# Patient Record
Sex: Male | Born: 1945 | Race: Asian | Hispanic: No | Marital: Married | Smoking: Former smoker
Health system: Southern US, Community
[De-identification: ages and names within clinical notes are randomized; demographics above are authoritative.]

## PROBLEM LIST (undated history)

## (undated) DIAGNOSIS — I10 Essential (primary) hypertension: Secondary | ICD-10-CM

## (undated) DIAGNOSIS — Z720 Tobacco use: Secondary | ICD-10-CM

## (undated) DIAGNOSIS — F1020 Alcohol dependence, uncomplicated: Secondary | ICD-10-CM

## (undated) DIAGNOSIS — I509 Heart failure, unspecified: Secondary | ICD-10-CM

## (undated) HISTORY — PX: ABDOMINAL SURGERY: SHX537

## (undated) HISTORY — PX: APPENDECTOMY: SHX54

---

## 2015-04-21 ENCOUNTER — Ambulatory Visit
Admission: RE | Admit: 2015-04-21 | Discharge: 2015-04-21 | Disposition: A | Discharge status: Home / Self Care | Payer: Medicare Other | Source: Ambulatory Visit | Attending: Nurse Practitioner | Admitting: Nurse Practitioner

## 2015-04-21 ENCOUNTER — Other Ambulatory Visit: Payer: Self-pay | Admitting: Nurse Practitioner

## 2015-04-21 DIAGNOSIS — J189 Pneumonia, unspecified organism: Secondary | ICD-10-CM | POA: Insufficient documentation

## 2015-04-22 ENCOUNTER — Other Ambulatory Visit: Payer: Self-pay | Admitting: Nurse Practitioner

## 2015-04-22 DIAGNOSIS — R059 Cough, unspecified: Secondary | ICD-10-CM

## 2015-04-22 DIAGNOSIS — R05 Cough: Secondary | ICD-10-CM

## 2015-04-22 DIAGNOSIS — J159 Unspecified bacterial pneumonia: Secondary | ICD-10-CM

## 2015-04-28 ENCOUNTER — Ambulatory Visit
Admission: RE | Admit: 2015-04-28 | Discharge: 2015-04-28 | Disposition: A | Discharge status: Home / Self Care | Payer: Medicare Other | Source: Ambulatory Visit | Attending: Nurse Practitioner | Admitting: Nurse Practitioner

## 2015-04-28 DIAGNOSIS — R05 Cough: Secondary | ICD-10-CM

## 2015-04-28 DIAGNOSIS — R059 Cough, unspecified: Secondary | ICD-10-CM

## 2015-04-28 DIAGNOSIS — J9811 Atelectasis: Secondary | ICD-10-CM | POA: Insufficient documentation

## 2015-04-28 DIAGNOSIS — J189 Pneumonia, unspecified organism: Secondary | ICD-10-CM | POA: Diagnosis not present

## 2018-07-26 ENCOUNTER — Other Ambulatory Visit: Payer: Self-pay

## 2018-07-26 ENCOUNTER — Encounter: Payer: Self-pay | Admitting: Emergency Medicine

## 2018-07-26 ENCOUNTER — Observation Stay
Admission: EM | Admit: 2018-07-26 | Discharge: 2018-07-27 | Disposition: A | Discharge status: Home / Self Care | Payer: Medicare Other | Attending: Family Medicine | Admitting: Family Medicine

## 2018-07-26 DIAGNOSIS — E876 Hypokalemia: Secondary | ICD-10-CM | POA: Diagnosis not present

## 2018-07-26 DIAGNOSIS — I959 Hypotension, unspecified: Secondary | ICD-10-CM | POA: Diagnosis present

## 2018-07-26 DIAGNOSIS — R55 Syncope and collapse: Secondary | ICD-10-CM | POA: Diagnosis not present

## 2018-07-26 DIAGNOSIS — Z8673 Personal history of transient ischemic attack (TIA), and cerebral infarction without residual deficits: Secondary | ICD-10-CM | POA: Diagnosis not present

## 2018-07-26 DIAGNOSIS — Y903 Blood alcohol level of 60-79 mg/100 ml: Secondary | ICD-10-CM | POA: Insufficient documentation

## 2018-07-26 DIAGNOSIS — R079 Chest pain, unspecified: Secondary | ICD-10-CM

## 2018-07-26 DIAGNOSIS — I358 Other nonrheumatic aortic valve disorders: Secondary | ICD-10-CM | POA: Diagnosis not present

## 2018-07-26 DIAGNOSIS — D539 Nutritional anemia, unspecified: Secondary | ICD-10-CM | POA: Diagnosis not present

## 2018-07-26 DIAGNOSIS — F17210 Nicotine dependence, cigarettes, uncomplicated: Secondary | ICD-10-CM | POA: Diagnosis not present

## 2018-07-26 DIAGNOSIS — F10229 Alcohol dependence with intoxication, unspecified: Secondary | ICD-10-CM | POA: Diagnosis not present

## 2018-07-26 DIAGNOSIS — I110 Hypertensive heart disease with heart failure: Secondary | ICD-10-CM | POA: Insufficient documentation

## 2018-07-26 DIAGNOSIS — I5030 Unspecified diastolic (congestive) heart failure: Secondary | ICD-10-CM | POA: Insufficient documentation

## 2018-07-26 DIAGNOSIS — I351 Nonrheumatic aortic (valve) insufficiency: Secondary | ICD-10-CM | POA: Diagnosis not present

## 2018-07-26 DIAGNOSIS — Z79899 Other long term (current) drug therapy: Secondary | ICD-10-CM | POA: Diagnosis not present

## 2018-07-26 HISTORY — DX: Tobacco use: Z72.0

## 2018-07-26 HISTORY — DX: Alcohol dependence, uncomplicated: F10.20

## 2018-07-26 HISTORY — DX: Essential (primary) hypertension: I10

## 2018-07-26 LAB — COMPREHENSIVE METABOLIC PANEL
ALBUMIN: 3 g/dL — AB (ref 3.5–5.0)
ALK PHOS: 42 U/L (ref 38–126)
ALT: 10 U/L (ref 0–44)
AST: 26 U/L (ref 15–41)
Anion gap: 8 (ref 5–15)
BUN: 12 mg/dL (ref 8–23)
CALCIUM: 8 mg/dL — AB (ref 8.9–10.3)
CO2: 22 mmol/L (ref 22–32)
CREATININE: 1 mg/dL (ref 0.61–1.24)
Chloride: 106 mmol/L (ref 98–111)
GFR calc Af Amer: >60 mL/min (ref >60)
GFR calc non Af Amer: >60 mL/min (ref >60)
GLUCOSE: 93 mg/dL (ref 70–99)
Potassium: 3.3 mmol/L — ABNORMAL LOW (ref 3.5–5.1)
SODIUM: 136 mmol/L (ref 135–145)
Total Bilirubin: 0.5 mg/dL (ref 0.3–1.2)
Total Protein: 5.5 g/dL — ABNORMAL LOW (ref 6.5–8.1)

## 2018-07-26 LAB — CBC
HCT: 36.1 % — ABNORMAL LOW (ref 40.0–52.0)
HEMOGLOBIN: 12.8 g/dL — AB (ref 13.0–18.0)
MCH: 36.7 pg — ABNORMAL HIGH (ref 26.0–34.0)
MCHC: 35.3 g/dL (ref 32.0–36.0)
MCV: 104 fL — ABNORMAL HIGH (ref 80.0–100.0)
Platelets: 214 10*3/uL (ref 150–440)
RBC: 3.48 MIL/uL — AB (ref 4.40–5.90)
RDW: 13.7 % (ref 11.5–14.5)
WBC: 9.1 10*3/uL (ref 3.8–10.6)

## 2018-07-26 LAB — ETHANOL: Alcohol, Ethyl (B): 78 mg/dL — ABNORMAL HIGH (ref <10)

## 2018-07-26 LAB — TROPONIN I: Troponin I: <0.03 ng/mL (ref <0.03)

## 2018-07-26 MED ORDER — POTASSIUM CHLORIDE CRYS ER 20 MEQ PO TBCR
40.0000 meq | EXTENDED_RELEASE_TABLET | Freq: Once | ORAL | Status: AC
  Administered 2018-07-27: 40 meq via ORAL
  Filled 2018-07-26: qty 2

## 2018-07-26 MED ORDER — FOLIC ACID 1 MG PO TABS
1.0000 mg | ORAL_TABLET | Freq: Once | ORAL | Status: AC
  Administered 2018-07-27: 1 mg via ORAL
  Filled 2018-07-26: qty 1

## 2018-07-26 MED ORDER — SODIUM CHLORIDE 0.9 % IV SOLN
1000.0000 mL | Freq: Once | INTRAVENOUS | Status: AC
  Administered 2018-07-26: 1000 mL via INTRAVENOUS

## 2018-07-26 MED ORDER — THIAMINE HCL 100 MG/ML IJ SOLN
100.0000 mg | Freq: Once | INTRAMUSCULAR | Status: AC
  Administered 2018-07-27: 100 mg via INTRAVENOUS
  Filled 2018-07-26: qty 2

## 2018-07-26 NOTE — ED Triage Notes (Signed)
Pt in via [facility] from family get together; family reports patient drinks regularly, having "a few beers tonight" w/ syncopal episode while sitting at table.  Pt lethargic, drifting off to sleep during conversation; pt arousable to verbal stimuli and oriented x 4.  Pt hypotensive,  Other vitals WDL.

## 2018-07-26 NOTE — ED Provider Notes (Signed)
[facility] Emergency Department Provider Note  ____________________________________________   First MD Initiated Contact with Patient 07/26/18 2258     (approximate)  I have reviewed the triage vital signs and the nursing notes.   HISTORY  Chief Complaint Loss of Consciousness   Patient speaks [place]   HPI Cody [name] is a 72 y.o. male's medical history includes hypertension and daily alcohol and tobacco use.  He presents by EMS after reportedly passing out.  His family says that he was at a party tonight, drinking some beer and eating some food when he suddenly "fell asleep".  He had poor skin color and did not respond briefly to them.  He says that he had a brief amount of chest pain earlier but it is gone now.  Currently he denies any pain as well as shortness of breath, nausea, and vomiting.  Upon arrival he was hypotensive but that is improving with some IV fluids.  He denies any recent illness.  Reportedly the onset of the episode was acute and severe but improved quickly and nothing in particular made it better or worse.  All of his family members made a point of saying that he drinks a lot of alcohol every day.  Past Medical History:  Diagnosis Date  . Alcohol dependence (HCC)   . Hypertension   . Tobacco abuse     Patient Active Problem List   Diagnosis Date Noted  . Syncope 07/27/2018    History reviewed. No pertinent surgical history.  Prior to Admission medications   Medication Sig Start Date End Date Taking? Authorizing Provider  atorvastatin (LIPITOR) 80 MG tablet Take 1 tablet by mouth every evening. 06/26/18  Yes [provider]  carvedilol (COREG) 25 MG tablet Take 25 mg by mouth 2 (two) times daily. 06/26/18  Yes [provider]  ENTRESTO 24-26 MG Take 1 tablet by mouth 2 (two) times daily. 06/26/18  Yes [provider]    Allergies Patient has no known allergies.  History reviewed. No pertinent family  history.  Social History Social History   Tobacco Use  . Smoking status: Current Every Day Smoker    Packs/day: 0.50    Types: Cigarettes  . Smokeless tobacco: Never Used  Substance Use Topics  . Alcohol use: Yes    Alcohol/week: 35.0 standard drinks    Types: 35 Cans of beer per week  . Drug use: Never    Review of Systems Constitutional: No fever/chills Eyes: No visual changes. ENT: No sore throat. Cardiovascular: Brief episode of chest pain earlier, now improved.  Apparent syncopal episode prior to arrival.  Hypotensive upon arrival in ED. Respiratory: Denies shortness of breath. Gastrointestinal: No abdominal pain.  No nausea, no vomiting.  No diarrhea.  No constipation. Genitourinary: Negative for dysuria. Musculoskeletal: Negative for neck pain.  Negative for back pain. Integumentary: Negative for rash. Neurological: Negative for headaches, focal weakness or numbness.   ____________________________________________   PHYSICAL EXAM:  VITAL SIGNS: ED Triage Vitals  Enc Vitals Group     BP 07/26/18 2245 (!) 82/50     Pulse Rate 07/26/18 2245 71     Resp 07/26/18 2245 18     Temp 07/26/18 2245 97.7 F (36.5 C)     Temp Source 07/26/18 2245 Oral     SpO2 07/26/18 2245 99 %     Weight 07/26/18 2246 47.6 kg (105 lb)     Height 07/26/18 2246 1.524 m (5')     Head Circumference --  Peak Flow --      Pain Score 07/26/18 2246 0     Pain Loc --      Pain Edu? --      Excl. in GC? --     Constitutional: Alert, no acute distress, smiling and interacting in spite of language barrier. Eyes: Conjunctivae are normal. PERRL. EOMI. Head: Atraumatic. Nose: No congestion/rhinnorhea. Mouth/Throat: Mucous membranes are moist. Neck: No stridor.  No meningeal signs.   Cardiovascular: Normal rate, regular rhythm. Good peripheral circulation. Grossly normal heart sounds. Respiratory: Normal respiratory effort.  No retractions. Lungs CTAB. Gastrointestinal: Soft and  nontender. No distention.  No pulsatile masses, no bruits. Musculoskeletal: No lower extremity tenderness nor edema. No gross deformities of extremities. Neurologic:  Normal speech and language. No gross focal neurologic deficits are appreciated.  Skin:  Skin is warm, dry and intact. No rash noted.   ____________________________________________   LABS (all labs ordered are listed, but only abnormal results are displayed)  Labs Reviewed  CBC - Abnormal; Notable for the following components:      Result Value   RBC 3.48 (*)    Hemoglobin 12.8 (*)    HCT 36.1 (*)    MCV 104.0 (*)    MCH 36.7 (*)    All other components within normal limits  COMPREHENSIVE METABOLIC PANEL - Abnormal; Notable for the following components:   Potassium 3.3 (*)    Calcium 8.0 (*)    Total Protein 5.5 (*)    Albumin 3.0 (*)    All other components within normal limits  ETHANOL - Abnormal; Notable for the following components:   Alcohol, Ethyl (B) 78 (*)    All other components within normal limits  TROPONIN I  URINE DRUG SCREEN, QUALITATIVE ([facility] ONLY)  MAGNESIUM  PHOSPHORUS  CALCIUM, IONIZED  PREALBUMIN  VITAMIN B12  FOLATE   ____________________________________________  EKG  ED ECG REPORT I, Loleta Rose, the attending physician, personally viewed and interpreted this ECG.  Date: 07/26/2018 EKG Time: 22: 39 Rate: 69 Rhythm: normal sinus rhythm QRS Axis: normal Intervals: normal ST/T Wave abnormalities: There is about a millimeter of ST segment elevation in V2 and not quite 2 mm of ST segment elevation in V3 but without reciprocal changes.  Not consistent with STEMI. Narrative Interpretation: no definitive evidence of ischemia with some nonspecific changes in V2 and V3, no old EKG against which to compare   ____________________________________________  RADIOLOGY I, Loleta Rose, personally viewed and evaluated these images (plain radiographs) as part of my medical decision making, as  well as reviewing the written report by the radiologist.  ED MD interpretation:  No acute abnormalities  Official radiology report(s): Ct Head Wo Contrast  Result Date: 07/27/2018 CLINICAL DATA:  Syncopal episode after drinking beer tonight. EXAM: CT HEAD WITHOUT CONTRAST TECHNIQUE: Contiguous axial images were obtained from the base of the skull through the vertex without intravenous contrast. COMPARISON:  None. FINDINGS: BRAIN: No intraparenchymal hemorrhage, mass effect nor midline shift. The ventricles and sulci are normal for age. Old small LEFT cerebellar infarct. Minimal supratentorial white matter hypodensities less than expected for patient's age, though non-specific are most compatible with chronic small vessel ischemic disease. No acute large vascular territory infarcts. No abnormal extra-axial fluid collections. Basal cisterns are patent. VASCULAR: Mild calcific atherosclerosis of the carotid siphons. SKULL: No skull fracture. No significant scalp soft tissue swelling. SINUSES/ORBITS: Trace paranasal sinus mucosal thickening. Mastoid air cells are well aerated.The included ocular globes and orbital contents are non-suspicious. OTHER:  None. IMPRESSION: 1. No acute intracranial process. 2. Old small LEFT cerebellar infarct. 3. Otherwise negative non-contrast CT HEAD for age. Electronically Signed   By: Awilda Metro M.D.   On: 07/27/2018 00:35    ____________________________________________   PROCEDURES  Critical Care performed: No   Procedure(s) performed:   Procedures   ____________________________________________   INITIAL IMPRESSION / ASSESSMENT AND PLAN / ED COURSE  As part of my medical decision making, I reviewed the following data within the electronic MEDICAL RECORD NUMBER History obtained from family, Nursing notes reviewed and incorporated, Labs reviewed , EKG interpreted , Old chart reviewed and Discussed with admitting physician     Differential diagnosis  includes, but is not limited to, alcohol intoxication and overall volume depletion, electrolyte abnormality, nonspecific metabolic abnormality, ACS, transient arrhythmia, aortic pathology such as dissection, acute infectious process such as urinary tract infection or pneumonia/aspiration.  The patient is in no distress at this time.  He has some potentially concerning changes on his EKG and reports a slight episode of chest pain earlier but it is unclear what exactly was feeling or what it occurred.  He is reporting no pain currently.  Vital signs are notable for hypotension upon arrival but his blood pressure is improving rapidly and is currently around 100 systolic and he is getting fluids.  No labs are back yet.  I will continue to reassess the patient clinically and is at a minimum I anticipate the need for a second troponin and.  If ED observation.   Clinical Course as of Jul 28 123  Sat Jul 26, 2018  2347 Unremarkable laboratory work-up including alcohol level of only 78.  I Delmos not feel comfortable attributing the patient's symptoms to his intoxication.  His CMP is reassuring as well with only very slightly low potassium of 3.3 but again which cannot explain his symptoms.  He is high risk syncope based on his hypertension, the acute onset and severe nature of the symptoms and the description provided during the HPI.  I will obtain a CT scan of his head to make sure there is no evidence of any acute intracranial pathology and then discussed the case with the hospitalist for admission.   [CF]  2348 Given the report of heavy daily drinking, I am putting the patient on CIWA and ordering thiamine 100 mg IV and folate 1 mg PO   [CF]  2349 Also ordering potassium 40 mEq PO   [CF]  Sun Jul 27, 2018  0058 No acute findings on head CT.  Will discuss with hospitalist.  SBP improved to about 112.  CT Head Wo Contrast [CF]    Clinical Course User Index [CF] Loleta Rose, MD     ____________________________________________  FINAL CLINICAL IMPRESSION(S) / ED DIAGNOSES  Final diagnoses:  Syncope and collapse  Chest pain, unspecified type  Hypotension, unspecified hypotension type     MEDICATIONS GIVEN DURING THIS VISIT:  Medications  lactated ringers infusion (has no administration in time range)  0.9 %  sodium chloride infusion (0 mLs Intravenous Stopped 07/26/18 2331)  folic acid (FOLVITE) tablet 1 mg (1 mg Oral Given 07/27/18 0007)  potassium chloride SA (K-DUR,KLOR-CON) CR tablet 40 mEq (40 mEq Oral Given 07/27/18 0007)  thiamine (B-1) injection 100 mg (100 mg Intravenous Given 07/27/18 0008)     ED Discharge Orders    None       Note:  This document was prepared using Dragon voice recognition software and may include unintentional dictation errors.  Loleta Rose, MD 07/27/18 [phone]

## 2018-07-27 ENCOUNTER — Observation Stay
Admit: 2018-07-27 | Discharge: 2018-07-27 | Disposition: A | Discharge status: Home / Self Care | Payer: Medicare Other | Attending: Internal Medicine | Admitting: Internal Medicine

## 2018-07-27 ENCOUNTER — Emergency Department: Payer: Medicare Other

## 2018-07-27 ENCOUNTER — Observation Stay: Payer: Medicare Other

## 2018-07-27 ENCOUNTER — Encounter: Payer: Self-pay | Admitting: Internal Medicine

## 2018-07-27 ENCOUNTER — Other Ambulatory Visit: Payer: Self-pay

## 2018-07-27 DIAGNOSIS — R55 Syncope and collapse: Secondary | ICD-10-CM | POA: Diagnosis not present

## 2018-07-27 LAB — VITAMIN B12: Vitamin B-12: 172 pg/mL — ABNORMAL LOW (ref 180–914)

## 2018-07-27 LAB — ECHOCARDIOGRAM COMPLETE
Height: 60 in
Weight: 1630.4 oz

## 2018-07-27 LAB — URINE DRUG SCREEN, QUALITATIVE (ARMC ONLY)
Amphetamines, Ur Screen: NOT DETECTED
Barbiturates, Ur Screen: NOT DETECTED
Benzodiazepine, Ur Scrn: NOT DETECTED
CANNABINOID 50 NG, UR ~~LOC~~: NOT DETECTED
Cocaine Metabolite,Ur ~~LOC~~: NOT DETECTED
MDMA (Ecstasy)Ur Screen: NOT DETECTED
METHADONE SCREEN, URINE: NOT DETECTED
Opiate, Ur Screen: NOT DETECTED
Phencyclidine (PCP) Ur S: NOT DETECTED
TRICYCLIC, UR SCREEN: NOT DETECTED

## 2018-07-27 LAB — TROPONIN I
Troponin I: <0.03 ng/mL (ref <0.03)
Troponin I: <0.03 ng/mL (ref <0.03)

## 2018-07-27 LAB — FOLATE: Folate: 35 ng/mL (ref >5.9)

## 2018-07-27 LAB — GLUCOSE, CAPILLARY
GLUCOSE-CAPILLARY: 80 mg/dL (ref 70–99)
GLUCOSE-CAPILLARY: 153 mg/dL — AB (ref 70–99)
GLUCOSE-CAPILLARY: 110 mg/dL — AB (ref 70–99)

## 2018-07-27 LAB — PHOSPHORUS: PHOSPHORUS: 3.3 mg/dL (ref 2.5–4.6)

## 2018-07-27 LAB — BRAIN NATRIURETIC PEPTIDE: B NATRIURETIC PEPTIDE 5: 11 pg/mL (ref 0.0–100.0)

## 2018-07-27 LAB — PREALBUMIN: PREALBUMIN: 26.9 mg/dL (ref 18–38)

## 2018-07-27 LAB — MAGNESIUM: MAGNESIUM: 2.1 mg/dL (ref 1.7–2.4)

## 2018-07-27 MED ORDER — POTASSIUM CHLORIDE CRYS ER 20 MEQ PO TBCR
40.0000 meq | EXTENDED_RELEASE_TABLET | Freq: Once | ORAL | Status: AC
  Administered 2018-07-27: 40 meq via ORAL
  Filled 2018-07-27: qty 2

## 2018-07-27 MED ORDER — ENOXAPARIN SODIUM 40 MG/0.4ML ~~LOC~~ SOLN
40.0000 mg | SUBCUTANEOUS | Status: DC
  Administered 2018-07-27: 40 mg via SUBCUTANEOUS
  Filled 2018-07-27: qty 0.4

## 2018-07-27 MED ORDER — FOLIC ACID 1 MG PO TABS
1.0000 mg | ORAL_TABLET | Freq: Every day | ORAL | Status: DC
  Administered 2018-07-27: 1 mg via ORAL
  Filled 2018-07-27: qty 1

## 2018-07-27 MED ORDER — SENNOSIDES-DOCUSATE SODIUM 8.6-50 MG PO TABS: 1.0000 | ORAL_TABLET | Freq: Every evening | ORAL | Status: DC | PRN

## 2018-07-27 MED ORDER — VITAMIN B-1 100 MG PO TABS: 100.0000 mg | ORAL_TABLET | Freq: Every day | ORAL | Status: DC

## 2018-07-27 MED ORDER — BISACODYL 5 MG PO TBEC: 5.0000 mg | DELAYED_RELEASE_TABLET | Freq: Every day | ORAL | Status: DC | PRN

## 2018-07-27 MED ORDER — CARVEDILOL 6.25 MG PO TABS: 3.1250 mg | ORAL_TABLET | Freq: Two times a day (BID) | ORAL | Status: DC

## 2018-07-27 MED ORDER — ADULT MULTIVITAMIN W/MINERALS CH
1.0000 | ORAL_TABLET | Freq: Every day | ORAL | Status: DC
  Administered 2018-07-27: 1 via ORAL
  Filled 2018-07-27: qty 1

## 2018-07-27 MED ORDER — LACTATED RINGERS IV SOLN: INTRAVENOUS | Status: DC

## 2018-07-27 MED ORDER — SODIUM CHLORIDE 0.9% FLUSH
3.0000 mL | Freq: Two times a day (BID) | INTRAVENOUS | Status: DC
  Administered 2018-07-27: 3 mL via INTRAVENOUS

## 2018-07-27 MED ORDER — ASPIRIN EC 325 MG PO TBEC
325.0000 mg | DELAYED_RELEASE_TABLET | Freq: Once | ORAL | Status: AC
  Administered 2018-07-27: 325 mg via ORAL
  Filled 2018-07-27: qty 1

## 2018-07-27 MED ORDER — LORAZEPAM 2 MG/ML IJ SOLN: 0.0000 mg | Freq: Four times a day (QID) | INTRAMUSCULAR | Status: DC

## 2018-07-27 MED ORDER — NITROGLYCERIN 0.4 MG SL SUBL: 0.4000 mg | SUBLINGUAL_TABLET | SUBLINGUAL | Status: DC | PRN

## 2018-07-27 MED ORDER — LORAZEPAM 1 MG PO TABS: 1.0000 mg | ORAL_TABLET | Freq: Four times a day (QID) | ORAL | Status: DC | PRN

## 2018-07-27 MED ORDER — ASPIRIN 81 MG PO CHEW
81.0000 mg | CHEWABLE_TABLET | Freq: Every day | ORAL | Status: DC
  Administered 2018-07-27: 81 mg via ORAL
  Filled 2018-07-27: qty 1

## 2018-07-27 MED ORDER — CARVEDILOL 25 MG PO TABS
25.0000 mg | ORAL_TABLET | Freq: Two times a day (BID) | ORAL | Status: DC
  Administered 2018-07-27: 25 mg via ORAL
  Filled 2018-07-27: qty 2

## 2018-07-27 MED ORDER — ATORVASTATIN CALCIUM 20 MG PO TABS: 80.0000 mg | ORAL_TABLET | Freq: Every evening | ORAL | Status: DC

## 2018-07-27 MED ORDER — MORPHINE SULFATE (PF) 2 MG/ML IV SOLN: 2.0000 mg | INTRAVENOUS | Status: DC | PRN

## 2018-07-27 MED ORDER — ONDANSETRON HCL 4 MG/2ML IJ SOLN: 4.0000 mg | Freq: Four times a day (QID) | INTRAMUSCULAR | Status: DC | PRN

## 2018-07-27 MED ORDER — LORAZEPAM 2 MG/ML IJ SOLN: 1.0000 mg | Freq: Four times a day (QID) | INTRAMUSCULAR | Status: DC | PRN

## 2018-07-27 MED ORDER — SACUBITRIL-VALSARTAN 24-26 MG PO TABS
1.0000 | ORAL_TABLET | Freq: Two times a day (BID) | ORAL | Status: DC
  Administered 2018-07-27: 1 via ORAL
  Filled 2018-07-27: qty 1

## 2018-07-27 MED ORDER — THIAMINE HCL 100 MG/ML IJ SOLN: 100.0000 mg | Freq: Every day | INTRAMUSCULAR | Status: DC

## 2018-07-27 MED ORDER — LORAZEPAM 2 MG/ML IJ SOLN: 0.0000 mg | Freq: Two times a day (BID) | INTRAMUSCULAR | Status: DC

## 2018-07-27 NOTE — Care Management Obs Status (Signed)
MEDICARE OBSERVATION STATUS NOTIFICATION   Patient Details  Name: Cody [name] MRN: [id number] Date of Birth: 06/03/1946   Medicare Observation Status Notification Given:  Yes    Kiondre Grenz A Tobi Groesbeck, RN 07/27/2018, 3:43 PM

## 2018-07-27 NOTE — Care Management Obs Status (Signed)
MEDICARE OBSERVATION STATUS NOTIFICATION   Patient Details  Name: Cody [name] MRN: [id number] Date of Birth: [birth year]   Medicare Observation Status Notification Given:     Reviewed instructions with family, patient speaks [place].   Virgel Manifold, RN 07/27/2018, 3:42 PM

## 2018-07-27 NOTE — Discharge Summary (Signed)
[facility] [place] [facility]   PATIENT NAME: Cody [name]    MR#:  [id number]  DATE OF BIRTH:  [birth year]  DATE OF ADMISSION:  07/26/2018 ADMITTING PHYSICIAN: Barbaraann Rondo, MD  DATE OF DISCHARGE: No discharge date for patient encounter.  PRIMARY CARE PHYSICIAN: Fayrene Helper, NP    ADMISSION DIAGNOSIS:  Syncope and collapse [R55] Syncope [R55] Hypotension, unspecified hypotension type [I95.9] Chest pain, unspecified type [R07.9]  DISCHARGE DIAGNOSIS:  Active Problems:   Syncope   SECONDARY DIAGNOSIS:   Past Medical History:  Diagnosis Date  . Alcohol dependence (HCC)   . Hypertension   . 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   carvedilol 25 MG tablet Commonly known as:  COREG Take 25 mg by mouth 2 (two) times daily.   ENTRESTO 24-26 MG Generic drug:  sacubitril-valsartan Take 1 tablet by mouth 2 (two) times daily.        DISCHARGE INSTRUCTIONS:    If you experience worsening of your admission symptoms, develop shortness of breath, life threatening emergency, suicidal or homicidal thoughts you must seek medical attention immediately by calling 911 or calling your MD immediately  if symptoms less severe.  You Must read complete instructions/literature along with all the possible adverse reactions/side effects for all the Medicines you take and that have been prescribed to you. Take  any new Medicines after you have completely understood and accept all the possible adverse reactions/side effects.   Please note  You were cared for by a hospitalist during your hospital stay. If you have any questions about your discharge medications or the care you received while you were in the hospital after you are discharged, you can call the unit and asked to speak with the hospitalist on call if the hospitalist that took care of you is not available. Once you are discharged, your primary care physician will handle any further medical issues. Please note that NO REFILLS for any discharge medications will be authorized once you are discharged, as it is imperative that you return to your primary care physician (or establish a relationship with a primary care physician if you Corran not have one) for your aftercare needs so that they can reassess your need for medications and monitor your lab values.    Today   CHIEF COMPLAINT:   Chief Complaint  Patient presents with  . Loss of Consciousness    HISTORY OF PRESENT ILLNESS:     VITAL SIGNS:  Blood pressure (!) 155/64, pulse 64, temperature 98.2 F (36.8 C), temperature source Oral, resp. rate 18, height 5' (1.524 m), weight 46.2 kg, SpO2 96 %.  I/O:    Intake/Output Summary (Last 24 hours) at 07/27/2018 1719 Last data filed at 07/27/2018 1402 Gross per 24 hour  Intake 1600 ml  Output -  Net 1600 ml    PHYSICAL EXAMINATION:  GENERAL:  72 y.o.-year-old patient lying  in the bed with no acute distress.  EYES: Pupils equal, round, reactive to light and accommodation. No scleral icterus. Extraocular muscles intact.  HEENT: Head atraumatic, normocephalic. Oropharynx and nasopharynx clear.  NECK:  Supple, no jugular venous distention. No thyroid enlargement, no tenderness.  LUNGS: Normal breath sounds bilaterally, no wheezing, rales,rhonchi or crepitation. No use of accessory muscles of respiration.  CARDIOVASCULAR: S1, S2 normal. No  murmurs, rubs, or gallops.  ABDOMEN: Soft, non-tender, non-distended. Bowel sounds present. No organomegaly or mass.  EXTREMITIES: No pedal edema, cyanosis, or clubbing.  NEUROLOGIC: Cranial nerves II through XII are intact. Muscle strength 5/5 in all extremities. Sensation intact. Gait not checked.  PSYCHIATRIC: The patient is alert and oriented x 3.  SKIN: No obvious rash, lesion, or ulcer.   DATA REVIEW:   CBC Recent Labs  Lab 07/26/18 2250  WBC 9.1  HGB 12.8*  HCT 36.1*  PLT 214    Chemistries  Recent Labs  Lab 07/26/18 2250 07/27/18 0652  NA 136  --   K 3.3*  --   CL 106  --   CO2 22  --   GLUCOSE 93  --   BUN 12  --   CREATININE 1.00  --   CALCIUM 8.0*  --   MG  --  2.1  AST 26  --   ALT 10  --   ALKPHOS 42  --   BILITOT 0.5  --     Cardiac Enzymes Recent Labs  Lab 07/27/18 1403  TROPONINI <0.03    Microbiology Results  No results found for this or any previous visit.  RADIOLOGY:  Ct Head Wo Contrast  Result Date: 07/27/2018 CLINICAL DATA:  Syncopal episode after drinking beer tonight. EXAM: CT HEAD WITHOUT CONTRAST TECHNIQUE: Contiguous axial images were obtained from the base of the skull through the vertex without intravenous contrast. COMPARISON:  None. FINDINGS: BRAIN: No intraparenchymal hemorrhage, mass effect nor midline shift. The ventricles and sulci are normal for age. Old small LEFT cerebellar infarct. Minimal supratentorial white matter hypodensities less than expected for patient's age, though non-specific are most compatible with chronic small vessel ischemic disease. No acute large vascular territory infarcts. No abnormal extra-axial fluid collections. Basal cisterns are patent. VASCULAR: Mild calcific atherosclerosis of the carotid siphons. SKULL: No skull fracture. No significant scalp soft tissue swelling. SINUSES/ORBITS: Trace paranasal sinus mucosal thickening. Mastoid air cells are well aerated.The included ocular globes and orbital  contents are non-suspicious. OTHER: None. IMPRESSION: 1. No acute intracranial process. 2. Old small LEFT cerebellar infarct. 3. Otherwise negative non-contrast CT HEAD for age. Electronically Signed   By: Awilda Metro M.D.   On: 07/27/2018 00:35   US Carotid Bilateral  Result Date: 07/27/2018 CLINICAL DATA:  Syncope EXAM: BILATERAL CAROTID DUPLEX ULTRASOUND TECHNIQUE: Wallace Cullens scale imaging, color Doppler and duplex ultrasound were performed of bilateral carotid and vertebral arteries in the neck. COMPARISON:  None. FINDINGS: Criteria: Quantification of carotid stenosis is based on velocity parameters that correlate the residual internal carotid diameter with NASCET-based stenosis levels, using the diameter of the distal internal carotid lumen as the denominator for stenosis measurement. The following velocity measurements were obtained: RIGHT ICA: 96/30 cm/sec CCA: 72/18 cm/sec SYSTOLIC ICA/CCA RATIO:  1.3 ECA: 64 cm/sec LEFT ICA: 154/86 cm/sec CCA: 96/21 cm/sec SYSTOLIC ICA/CCA RATIO:  1.6 ECA: 80 cm/sec RIGHT CAROTID ARTERY: Mild plaque in the proximal ICA RIGHT VERTEBRAL ARTERY:  Antegrade flow LEFT CAROTID ARTERY:  Mild plaque in the proximal ICA LEFT VERTEBRAL ARTERY:  Antegrade flow IMPRESSION: 1. No significant stenosis in the right internal carotid artery. 2. Mildly elevated systolic and diastolic velocities in the distal ICA. The distal left ICA is tortuous in the region of measured velocities however. The mild elevation may be artifactual. Mild stenosis of approximately 50% is not completely excluded on this study. No high-grade stenosis identified. A CTA could better evaluate this region if clinically warranted. 3. Antegrade flow in both vertebral arteries. Electronically Signed   By: Gerome Sam III M.D   On: 07/27/2018 11:43   Dg Chest Port 1 View  Result Date: 07/27/2018 CLINICAL DATA:  72 year old male with syncope. EXAM: PORTABLE CHEST 1 VIEW COMPARISON:  Chest CT dated 04/28/2015  FINDINGS: The heart size and mediastinal contours are within normal limits. Both lungs are clear. The visualized skeletal structures are unremarkable. IMPRESSION: No active disease. Electronically Signed   By: Elgie Collard M.D.   On: 07/27/2018 01:55    EKG:   Orders placed or performed during the hospital encounter of 07/26/18  . EKG 12-Lead  . EKG 12-Lead  . EKG 12-Lead (at 6am)      Management plans discussed with the patient, family and they are in agreement.  CODE STATUS:     Code Status Orders  (From admission, onward)         Start     Ordered   07/27/18 0224  Full code  Continuous     07/27/18 0223        Code Status History    Date Active Date Inactive Code Status Order ID Comments User Context   07/27/2018 0224 07/27/2018 0224 Full Code [id number]  Barbaraann Rondo, MD ED      TOTAL TIME TAKING CARE OF THIS PATIENT: 35 minutes.    Evelena Asa [place] Duquette M.D on 07/27/2018 at 5:19 PM  Between 7am to 6pm - Pager - [phone]  After 6pm go to [email or web address] - Social research officer, government  [facility]  Office  [phone]  CC: Primary care physician; Fayrene Helper, NP   Note: This dictation was prepared with Dragon dictation along with smaller phrase technology. Any transcriptional errors that result from this process are unintentional.

## 2018-07-27 NOTE — Progress Notes (Signed)
RN used the interpreter tablet on a stick.  It worked very well.  Patient and his wife were very happy about their discharge teaching.  They asked questions about their medications and appointments.  They will leave in a private vehicle.  The wife will drive.  RN removed his IV.  Christean Grief, RN

## 2018-07-27 NOTE — Consult Note (Signed)
Cody [name] is a 72 y.o. male  [id number]  Primary Cardiologist: Adrian Blackwater Reason for Consultation: Syncope  HPI: This is a 72 year old [place] male who is very well-known to me presented to the hospital.  Episode where he passed out after having been drinking heavily.  He denies any chest pain or shortness of breath but was feeling dizzy.   Review of Systems: No orthopnea PND or leg swelling   Past Medical History:  Diagnosis Date  . Alcohol dependence (HCC)   . Hypertension   . Tobacco abuse     Medications Prior to Admission  Medication Sig Dispense Refill  . atorvastatin (LIPITOR) 80 MG tablet Take 1 tablet by mouth every evening.  5  . carvedilol (COREG) 25 MG tablet Take 25 mg by mouth 2 (two) times daily.  3  . ENTRESTO 24-26 MG Take 1 tablet by mouth 2 (two) times daily.  5     . aspirin  81 mg Oral Daily  . atorvastatin  80 mg Oral QPM  . carvedilol  25 mg Oral BID  . enoxaparin (LOVENOX) injection  40 mg Subcutaneous Q24H  . folic acid  1 mg Oral Daily  . LORazepam  0-4 mg Intravenous Q6H   Followed by  . [START ON 07/29/2018] LORazepam  0-4 mg Intravenous Q12H  . multivitamin with minerals  1 tablet Oral Daily  . potassium chloride  40 mEq Oral Once  . sacubitril-valsartan  1 tablet Oral BID  . sodium chloride flush  3 mL Intravenous Q12H  . thiamine  100 mg Oral Daily   Or  . thiamine  100 mg Intravenous Daily    Infusions:   No Known Allergies  Social History   Socioeconomic History  . Marital status: Married    Spouse name: Not on file  . Number of children: Not on file  . Years of education: Not on file  . Highest education level: Not on file  Occupational History  . Not on file  Social Needs  . Financial resource strain: Not on file  . Food insecurity:    Worry: Not on file    Inability: Not on file  . Transportation needs:    Medical: Not on file    Non-medical: Not on file  Tobacco Use  . Smoking status: Current Every Day  Smoker    Packs/day: 0.50    Types: Cigarettes  . Smokeless tobacco: Never Used  Substance and Sexual Activity  . Alcohol use: Yes    Alcohol/week: 35.0 standard drinks    Types: 35 Cans of beer per week  . Drug use: Never  . Sexual activity: Not on file  Lifestyle  . Physical activity:    Days per week: Not on file    Minutes per session: Not on file  . Stress: Not on file  Relationships  . Social connections:    Talks on phone: Not on file    Gets together: Not on file    Attends religious service: Not on file    Active member of club or organization: Not on file    Attends meetings of clubs or organizations: Not on file    Relationship status: Not on file  . Intimate partner violence:    Fear of current or ex partner: Not on file    Emotionally abused: Not on file    Physically abused: Not on file    Forced sexual activity: Not on file  Other Topics Concern  .  Not on file  Social History Narrative  . Not on file    History reviewed. No pertinent family history.  PHYSICAL EXAM: Vitals:   07/27/18 0939 07/27/18 0943  BP: (!) 150/70 (!) 155/64  Pulse: 72 64  Resp:    Temp:    SpO2: 98% 96%     Intake/Output Summary (Last 24 hours) at 07/27/2018 1049 Last data filed at 07/27/2018 0957 Gross per 24 hour  Intake 1360 ml  Output -  Net 1360 ml    General:  Well appearing. No respiratory difficulty HEENT: normal Neck: supple. no JVD. Carotids 2+ bilat; no bruits. No lymphadenopathy or thryomegaly appreciated. Cor: PMI nondisplaced. Regular rate & rhythm. No rubs, gallops or murmurs. Lungs: clear Abdomen: soft, nontender, nondistended. No hepatosplenomegaly. No bruits or masses. Good bowel sounds. Extremities: no cyanosis, clubbing, rash, edema Neuro: alert & oriented x 3, cranial nerves grossly intact. moves all 4 extremities w/o difficulty. Affect pleasant.  ECG: Sinus bradycardia with nonspecific ST-T changes  Results for orders placed or performed during  the hospital encounter of 07/26/18 (from the past 24 hour(s))  CBC     Status: Abnormal   Collection Time: 07/26/18 10:50 PM  Result Value Ref Range   WBC 9.1 3.8 - 10.6 K/uL   RBC 3.48 (L) 4.40 - 5.90 MIL/uL   Hemoglobin 12.8 (L) 13.0 - 18.0 g/dL   HCT 32.4 (L) 40.1 - 02.7 %   MCV 104.0 (H) 80.0 - 100.0 fL   MCH 36.7 (H) 26.0 - 34.0 pg   MCHC 35.3 32.0 - 36.0 g/dL   RDW 25.3 66.4 - 40.3 %   Platelets 214 150 - 440 K/uL  Comprehensive metabolic panel     Status: Abnormal   Collection Time: 07/26/18 10:50 PM  Result Value Ref Range   Sodium 136 135 - 145 mmol/L   Potassium 3.3 (L) 3.5 - 5.1 mmol/L   Chloride 106 98 - 111 mmol/L   CO2 22 22 - 32 mmol/L   Glucose, Bld 93 70 - 99 mg/dL   BUN 12 8 - 23 mg/dL   Creatinine, Ser 4.74 0.61 - 1.24 mg/dL   Calcium 8.0 (L) 8.9 - 10.3 mg/dL   Total Protein 5.5 (L) 6.5 - 8.1 g/dL   Albumin 3.0 (L) 3.5 - 5.0 g/dL   AST 26 15 - 41 U/L   ALT 10 0 - 44 U/L   Alkaline Phosphatase 42 38 - 126 U/L   Total Bilirubin 0.5 0.3 - 1.2 mg/dL   GFR calc non Af Amer >60 >60 mL/min   GFR calc Af Amer >60 >60 mL/min   Anion gap 8 5 - 15  Troponin I     Status: None   Collection Time: 07/26/18 10:50 PM  Result Value Ref Range   Troponin I <0.03 <0.03 ng/mL  Ethanol     Status: Abnormal   Collection Time: 07/26/18 10:50 PM  Result Value Ref Range   Alcohol, Ethyl (B) 78 (H) <10 mg/dL  Urine Drug Screen, Qualitative ([facility] only)     Status: None   Collection Time: 07/26/18 10:50 PM  Result Value Ref Range   Tricyclic, Ur Screen NONE DETECTED NONE DETECTED   Amphetamines, Ur Screen NONE DETECTED NONE DETECTED   MDMA (Ecstasy)Ur Screen NONE DETECTED NONE DETECTED   Cocaine Metabolite,Ur [place] NONE DETECTED NONE DETECTED   Opiate, Ur Screen NONE DETECTED NONE DETECTED   Phencyclidine (PCP) Ur S NONE DETECTED NONE DETECTED   Cannabinoid 50 Ng, Ur  [place] NONE DETECTED NONE DETECTED   Barbiturates, Ur Screen NONE DETECTED NONE DETECTED   Benzodiazepine, Ur Scrn  NONE DETECTED NONE DETECTED   Methadone Scn, Ur NONE DETECTED NONE DETECTED  Brain natriuretic peptide     Status: None   Collection Time: 07/26/18 10:50 PM  Result Value Ref Range   B Natriuretic Peptide 11.0 0.0 - 100.0 pg/mL  Magnesium     Status: None   Collection Time: 07/27/18  6:52 AM  Result Value Ref Range   Magnesium 2.1 1.7 - 2.4 mg/dL  Phosphorus     Status: None   Collection Time: 07/27/18  6:52 AM  Result Value Ref Range   Phosphorus 3.3 2.5 - 4.6 mg/dL  Troponin I-serum (0, 3, 6 hours)     Status: None   Collection Time: 07/27/18  6:52 AM  Result Value Ref Range   Troponin I <0.03 <0.03 ng/mL  Glucose, capillary     Status: None   Collection Time: 07/27/18  8:03 AM  Result Value Ref Range   Glucose-Capillary 80 70 - 99 mg/dL   Ct Head Wo Contrast  Result Date: 07/27/2018 CLINICAL DATA:  Syncopal episode after drinking beer tonight. EXAM: CT HEAD WITHOUT CONTRAST TECHNIQUE: Contiguous axial images were obtained from the base of the skull through the vertex without intravenous contrast. COMPARISON:  None. FINDINGS: BRAIN: No intraparenchymal hemorrhage, mass effect nor midline shift. The ventricles and sulci are normal for age. Old small LEFT cerebellar infarct. Minimal supratentorial white matter hypodensities less than expected for patient's age, though non-specific are most compatible with chronic small vessel ischemic disease. No acute large vascular territory infarcts. No abnormal extra-axial fluid collections. Basal cisterns are patent. VASCULAR: Mild calcific atherosclerosis of the carotid siphons. SKULL: No skull fracture. No significant scalp soft tissue swelling. SINUSES/ORBITS: Trace paranasal sinus mucosal thickening. Mastoid air cells are well aerated.The included ocular globes and orbital contents are non-suspicious. OTHER: None. IMPRESSION: 1. No acute intracranial process. 2. Old small LEFT cerebellar infarct. 3. Otherwise negative non-contrast CT HEAD for age.  Electronically Signed   By: Awilda Metro M.D.   On: 07/27/2018 00:35   Dg Chest Port 1 View  Result Date: 07/27/2018 CLINICAL DATA:  72 year old male with syncope. EXAM: PORTABLE CHEST 1 VIEW COMPARISON:  Chest CT dated 04/28/2015 FINDINGS: The heart size and mediastinal contours are within normal limits. Both lungs are clear. The visualized skeletal structures are unremarkable. IMPRESSION: No active disease. Electronically Signed   By: Elgie Collard M.D.   On: 07/27/2018 01:55     ASSESSMENT AND PLAN: Syncope most likely due to EtOH abuse with electrolyte disturbance such as hypokalemia and dehydration.  Agree with giving fluid and hydrate the patient and can be discharged after potassium is over 4 with follow-up tomorrow at 2 PM in the office.  We will [place] outpatient work-up.  Patient had recent echo and does not need another echocardiogram.  Valentine Barney A

## 2018-07-27 NOTE — ED Notes (Signed)
Patient transported to CT 

## 2018-07-27 NOTE — ED Notes (Signed)
Hospitalist to bedside.

## 2018-07-27 NOTE — Plan of Care (Signed)

## 2018-07-27 NOTE — H&P (Signed)
[facility] - [place] [facility]   PATIENT NAME: Cody [name]    MR#:  [id number]  DATE OF BIRTH:  1946-03-04  DATE OF ADMISSION:  07/26/2018  PRIMARY CARE PHYSICIAN: Fayrene Helper, NP   REQUESTING/REFERRING PHYSICIAN: Loleta Rose, MD  CHIEF COMPLAINT:   Chief Complaint  Patient presents with  . Loss of Consciousness    HISTORY OF PRESENT ILLNESS:  Cody [name]  is a 72 y.o. male with a known history of HTN, EtOH use/dependence p/w syncope, hypotension, CP. Pt is [place]-speaking. Hx is obtained from pt's family at bedside, albeit somewhat limited. I am told pt drinks about five beers per day. He eats five to six meals per day, but a very small quantity of food. He appears rather thin, but he is alert, comfortable and in no distress. He was apparently at a get-together with family and friends on Saturday (09/28) evening. He ate a small dinner @~2015PM. @~2100, pt's family noticed that he was very "tired"/"sleepy". He was sitting in a chair at the time. They noted him to be pale and diaphoretic. He reported generalized weakness, and was unable to stand up and/or ambulate. At some point, he turned grey and became briefly unresponsive. EMS was called. He had N/V x1, after which he reportedly felt better. There was no report of shaking, tongue-bite, incontinence or confusion. There was some report of transient chest pain, though he does not have any at the time of my assessment. BP 82/50 on arrival to ED, improved w/ IVF, 110s/70s at the time of my assessment.  Pt apparently sees Cardiology as outpt, Dr. Welton Flakes, but pt and family are unable to tell me why. Pt is on Coreg, Entresto and Lipitor at home, suggesting a Hx of CHF/cardiomyopathy. I Zaheer not appreciate a murmur on exam.  PAST MEDICAL HISTORY:   Past Medical History:  Diagnosis Date  . Alcohol dependence (HCC)   . Hypertension   . Tobacco abuse     PAST SURGICAL HISTORY:  History reviewed. No pertinent  surgical history.  SOCIAL HISTORY:   Social History   Tobacco Use  . Smoking status: Current Every Day Smoker    Packs/day: 0.50    Types: Cigarettes  . Smokeless tobacco: Never Used  Substance Use Topics  . Alcohol use: Yes    Alcohol/week: 35.0 standard drinks    Types: 35 Cans of beer per week    FAMILY HISTORY:  History reviewed. No pertinent family history.  DRUG ALLERGIES:  No Known Allergies  REVIEW OF SYSTEMS:   Review of Systems  Constitutional: Positive for diaphoresis and malaise/fatigue. Negative for chills, fever and weight loss.  HENT: Negative for congestion, ear pain, hearing loss, nosebleeds, sinus pain, sore throat and tinnitus.   Eyes: Negative for blurred vision, double vision and photophobia.  Respiratory: Negative for cough, hemoptysis, sputum production, shortness of breath and wheezing.   Cardiovascular: Positive for chest pain. Negative for palpitations, orthopnea, claudication, leg swelling and PND.  Gastrointestinal: Positive for nausea and vomiting. Negative for abdominal pain, blood in stool, constipation, diarrhea, heartburn and melena.  Genitourinary: Negative for dysuria, frequency, hematuria and urgency.  Musculoskeletal: Negative for back pain, joint pain, myalgias and neck pain.  Skin: Negative for itching and rash.  Neurological: Positive for dizziness, loss of consciousness and weakness. Negative for tingling, tremors, sensory change, speech change, focal weakness, seizures and headaches.  Psychiatric/Behavioral: Negative for memory loss. The patient does not have insomnia.    MEDICATIONS AT HOME:  Prior to Admission medications   Medication Sig Start Date End Date Taking? Authorizing Provider  atorvastatin (LIPITOR) 80 MG tablet Take 1 tablet by mouth every evening. 06/26/18  Yes [provider]  carvedilol (COREG) 25 MG tablet Take 25 mg by mouth 2 (two) times daily. 06/26/18  Yes [provider]  ENTRESTO 24-26 MG  Take 1 tablet by mouth 2 (two) times daily. 06/26/18  Yes [provider]      VITAL SIGNS:  Blood pressure 119/71, pulse 62, temperature 97.7 F (36.5 C), temperature source Oral, resp. rate (!) 21, height 5' (1.524 m), weight 47.6 kg, SpO2 100 %.  PHYSICAL EXAMINATION:  Physical Exam  Constitutional: He appears well-developed. He is active and cooperative.  Non-toxic appearance. He does not have a sickly appearance. He does not appear ill. No distress. He is not intubated.  HENT:  Head: Normocephalic and atraumatic.  Mouth/Throat: Oropharynx is clear and moist. No oropharyngeal exudate.  Eyes: Conjunctivae, EOM and lids are normal. No scleral icterus.  Neck: Neck supple. No JVD present. No thyromegaly present.  Cardiovascular: Normal rate, regular rhythm, S1 normal, S2 normal and normal heart sounds.  No extrasystoles are present. Exam reveals no gallop, no S3, no S4, no distant heart sounds and no friction rub.  No murmur heard. Pulmonary/Chest: Effort normal and breath sounds normal. No accessory muscle usage or stridor. No apnea, no tachypnea and no bradypnea. He is not intubated. No respiratory distress. He has no decreased breath sounds. He has no wheezes. He has no rhonchi. He has no rales.  Abdominal: Soft. Bowel sounds are normal. He exhibits no distension. There is no tenderness. There is no rigidity, no rebound and no guarding.  Musculoskeletal: Normal range of motion. He exhibits no edema or tenderness.  Lymphadenopathy:    He has no cervical adenopathy.  Neurological: He is alert. He is not disoriented.  Skin: Skin is warm, dry and intact. He is not diaphoretic. No erythema. No pallor.  Psychiatric: He has a normal mood and affect. His behavior is normal. Judgment normal. His mood appears not anxious. His affect is not angry and not inappropriate. His speech is not rapid and/or pressured, not delayed and not slurred. He is not actively hallucinating. He does not express  impulsivity or inappropriate judgment. He does not exhibit a depressed mood. He is communicative. He is attentive.     LABORATORY PANEL:   CBC Recent Labs  Lab 07/26/18 2250  WBC 9.1  HGB 12.8*  HCT 36.1*  PLT 214   ------------------------------------------------------------------------------------------------------------------  Chemistries  Recent Labs  Lab 07/26/18 2250  NA 136  K 3.3*  CL 106  CO2 22  GLUCOSE 93  BUN 12  CREATININE 1.00  CALCIUM 8.0*  AST 26  ALT 10  ALKPHOS 42  BILITOT 0.5   ------------------------------------------------------------------------------------------------------------------  Cardiac Enzymes Recent Labs  Lab 07/26/18 2250  TROPONINI <0.03   ------------------------------------------------------------------------------------------------------------------  RADIOLOGY:  Ct Head Wo Contrast  Result Date: 07/27/2018 CLINICAL DATA:  Syncopal episode after drinking beer tonight. EXAM: CT HEAD WITHOUT CONTRAST TECHNIQUE: Contiguous axial images were obtained from the base of the skull through the vertex without intravenous contrast. COMPARISON:  None. FINDINGS: BRAIN: No intraparenchymal hemorrhage, mass effect nor midline shift. The ventricles and sulci are normal for age. Old small LEFT cerebellar infarct. Minimal supratentorial white matter hypodensities less than expected for patient's age, though non-specific are most compatible with chronic small vessel ischemic disease. No acute large vascular territory infarcts. No abnormal extra-axial  fluid collections. Basal cisterns are patent. VASCULAR: Mild calcific atherosclerosis of the carotid siphons. SKULL: No skull fracture. No significant scalp soft tissue swelling. SINUSES/ORBITS: Trace paranasal sinus mucosal thickening. Mastoid air cells are well aerated.The included ocular globes and orbital contents are non-suspicious. OTHER: None. IMPRESSION: 1. No acute intracranial process. 2. Old  small LEFT cerebellar infarct. 3. Otherwise negative non-contrast CT HEAD for age. Electronically Signed   By: Awilda Metro M.D.   On: 07/27/2018 00:35   Dg Chest Port 1 View  Result Date: 07/27/2018 CLINICAL DATA:  72 year old male with syncope. EXAM: PORTABLE CHEST 1 VIEW COMPARISON:  Chest CT dated 04/28/2015 FINDINGS: The heart size and mediastinal contours are within normal limits. Both lungs are clear. The visualized skeletal structures are unremarkable. IMPRESSION: No active disease. Electronically Signed   By: Elgie Collard M.D.   On: 07/27/2018 01:55   IMPRESSION AND PLAN:   A/P: 69M w/ Hx EtOH use/dependence p/w syncope, hypotension, CP. Hypokalemia, hypocalcemia, hypoalbuminemia/hypoproteinemia, macrocytic anemia. Unknown cardiac Hx, follows w/ Dr. Welton Flakes. -Syncope, hypotension, CP: CP-free at the time of my assessment. Appears well. Hypotension resolved w/ IVF. Trop-I (-), rpt pending. EKG (-) STEMI. CT head (-) acute intracranial pathology, though (+) for old small cerebellar infarct. ONG29. Morphine, NTG, O2 PRN. Orthostatic VS, FSG qHS/AC, neuro checks q4h x24hr, fall precautions. Cardiac Hx unclear, on Entresto, suggesting Hx CHF/CM, will avoid administering further IVF. Echo pending. CXR (-), BNP pending. Does not clinically appear to be in failure. C/w statin, beta blocker, Entresto. Cardiology consult, records. -EtOH use/dependence: EtOH level 78 on admit. Reportedly drinks 5 beers/day. CIWA. Thiamine, folate, MVI. No Tylenol. Phos level pending. -Hypokalemia: K+ 3.3. Replete and monitor. Mag level pending. -Hypocalcemia: Ionized calcium. -Hypoalbuminemia/hypoproteinemia: Thin. Eats many small meals per day. Daily EtOH. BMI 19.90, weight 46.2kg. Suspect malnourishment. Prealbumin. -Macrocytic anemia: Hgb 12.8, no prior labwork available for comparison. MCV 104.0. Folate, B12 levels pending. -c/w home medications. -FEN/GI: Cardiac diet. -DVT PPx: Lovenox. -Code status: Full  code. -Disposition: Observation, < 2 midnights.   All the records are reviewed and case discussed with ED provider. Management plans discussed with the patient, family and they are in agreement.  CODE STATUS: Full code.  TOTAL TIME TAKING CARE OF THIS PATIENT: 75 minutes.    Barbaraann Rondo M.D on 07/27/2018 at 2:10 AM  Between 7am to 6pm - Pager - [phone]  After 6pm go to [email or web address] - Scientist, research (life sciences) [facility]  Office  [phone]  CC: Primary care physician; Fayrene Helper, NP   Note: This dictation was prepared with Dragon dictation along with smaller phrase technology. Any transcriptional errors that result from this process are unintentional.

## 2018-07-29 LAB — HIV ANTIBODY (ROUTINE TESTING W REFLEX): HIV Screen 4th Generation wRfx: NONREACTIVE

## 2018-07-29 LAB — CALCIUM, IONIZED: Calcium, Ionized, Serum: 4.8 mg/dL (ref 4.5–5.6)

## 2018-08-08 ENCOUNTER — Other Ambulatory Visit: Payer: Self-pay | Admitting: Cardiovascular Disease

## 2018-08-13 ENCOUNTER — Other Ambulatory Visit: Payer: Self-pay | Admitting: Cardiovascular Disease

## 2018-08-13 DIAGNOSIS — I200 Unstable angina: Secondary | ICD-10-CM | POA: Insufficient documentation

## 2018-08-25 ENCOUNTER — Encounter
Admission: RE | Disposition: A | Discharge status: Home / Self Care | Payer: Self-pay | Source: Ambulatory Visit | Attending: Cardiovascular Disease

## 2018-08-25 ENCOUNTER — Encounter: Payer: Self-pay | Admitting: *Deleted

## 2018-08-25 ENCOUNTER — Other Ambulatory Visit: Payer: Self-pay

## 2018-08-25 ENCOUNTER — Ambulatory Visit
Admission: RE | Admit: 2018-08-25 | Discharge: 2018-08-25 | Disposition: A | Discharge status: Home / Self Care | Payer: Medicare Other | Source: Ambulatory Visit | Attending: Cardiovascular Disease | Admitting: Cardiovascular Disease

## 2018-08-25 DIAGNOSIS — Z79899 Other long term (current) drug therapy: Secondary | ICD-10-CM | POA: Insufficient documentation

## 2018-08-25 DIAGNOSIS — F17210 Nicotine dependence, cigarettes, uncomplicated: Secondary | ICD-10-CM | POA: Insufficient documentation

## 2018-08-25 DIAGNOSIS — I2584 Coronary atherosclerosis due to calcified coronary lesion: Secondary | ICD-10-CM | POA: Insufficient documentation

## 2018-08-25 DIAGNOSIS — I200 Unstable angina: Secondary | ICD-10-CM | POA: Diagnosis present

## 2018-08-25 DIAGNOSIS — Z7982 Long term (current) use of aspirin: Secondary | ICD-10-CM | POA: Diagnosis not present

## 2018-08-25 DIAGNOSIS — R0602 Shortness of breath: Secondary | ICD-10-CM | POA: Insufficient documentation

## 2018-08-25 DIAGNOSIS — I429 Cardiomyopathy, unspecified: Secondary | ICD-10-CM | POA: Diagnosis not present

## 2018-08-25 DIAGNOSIS — J449 Chronic obstructive pulmonary disease, unspecified: Secondary | ICD-10-CM | POA: Insufficient documentation

## 2018-08-25 DIAGNOSIS — M109 Gout, unspecified: Secondary | ICD-10-CM | POA: Diagnosis not present

## 2018-08-25 DIAGNOSIS — E785 Hyperlipidemia, unspecified: Secondary | ICD-10-CM | POA: Diagnosis not present

## 2018-08-25 DIAGNOSIS — Z8249 Family history of ischemic heart disease and other diseases of the circulatory system: Secondary | ICD-10-CM | POA: Insufficient documentation

## 2018-08-25 DIAGNOSIS — I25110 Atherosclerotic heart disease of native coronary artery with unstable angina pectoris: Secondary | ICD-10-CM | POA: Diagnosis not present

## 2018-08-25 DIAGNOSIS — Z91013 Allergy to seafood: Secondary | ICD-10-CM | POA: Diagnosis not present

## 2018-08-25 DIAGNOSIS — Z9889 Other specified postprocedural states: Secondary | ICD-10-CM | POA: Insufficient documentation

## 2018-08-25 HISTORY — DX: Heart failure, unspecified: I50.9

## 2018-08-25 HISTORY — PX: LEFT HEART CATH AND CORONARY ANGIOGRAPHY: CATH118249

## 2018-08-25 SURGERY — LEFT HEART CATH AND CORONARY ANGIOGRAPHY
Anesthesia: Moderate Sedation

## 2018-08-25 MED ORDER — MIDAZOLAM HCL 2 MG/2ML IJ SOLN
INTRAMUSCULAR | Status: DC | PRN
  Administered 2018-08-25: 0.5 mg via INTRAVENOUS

## 2018-08-25 MED ORDER — ONDANSETRON HCL 4 MG/2ML IJ SOLN: 4.0000 mg | Freq: Four times a day (QID) | INTRAMUSCULAR | Status: DC | PRN

## 2018-08-25 MED ORDER — FENTANYL CITRATE (PF) 100 MCG/2ML IJ SOLN
INTRAMUSCULAR | Status: AC
  Filled 2018-08-25: qty 2

## 2018-08-25 MED ORDER — SODIUM CHLORIDE 0.9% FLUSH: 3.0000 mL | INTRAVENOUS | Status: DC | PRN

## 2018-08-25 MED ORDER — IOPAMIDOL (ISOVUE-300) INJECTION 61%
INTRAVENOUS | Status: DC | PRN
  Administered 2018-08-25: 90 mL via INTRA_ARTERIAL

## 2018-08-25 MED ORDER — ASPIRIN 81 MG PO CHEW
81.0000 mg | CHEWABLE_TABLET | ORAL | Status: AC
  Administered 2018-08-25: 81 mg via ORAL

## 2018-08-25 MED ORDER — SODIUM CHLORIDE 0.9 % IV SOLN: 250.0000 mL | INTRAVENOUS | Status: DC | PRN

## 2018-08-25 MED ORDER — METHYLPREDNISOLONE SODIUM SUCC 125 MG IJ SOLR
INTRAMUSCULAR | Status: AC
  Administered 2018-08-25: 125 mg via INTRAVENOUS
  Filled 2018-08-25: qty 2

## 2018-08-25 MED ORDER — SODIUM CHLORIDE 0.9% FLUSH: 3.0000 mL | Freq: Two times a day (BID) | INTRAVENOUS | Status: DC

## 2018-08-25 MED ORDER — SODIUM CHLORIDE 0.9 % WEIGHT BASED INFUSION: 1.0000 mL/kg/h | INTRAVENOUS | Status: DC

## 2018-08-25 MED ORDER — ASPIRIN 81 MG PO CHEW
CHEWABLE_TABLET | ORAL | Status: AC
  Filled 2018-08-25: qty 1

## 2018-08-25 MED ORDER — DIPHENHYDRAMINE HCL 50 MG/ML IJ SOLN
50.0000 mg | Freq: Once | INTRAMUSCULAR | Status: AC
  Administered 2018-08-25: 50 mg via INTRAVENOUS

## 2018-08-25 MED ORDER — ACETAMINOPHEN 325 MG PO TABS: 650.0000 mg | ORAL_TABLET | ORAL | Status: DC | PRN

## 2018-08-25 MED ORDER — HEPARIN (PORCINE) IN NACL 1000-0.9 UT/500ML-% IV SOLN
INTRAVENOUS | Status: AC
  Filled 2018-08-25: qty 1000

## 2018-08-25 MED ORDER — SODIUM CHLORIDE 0.9 % WEIGHT BASED INFUSION
3.0000 mL/kg/h | INTRAVENOUS | Status: AC
  Administered 2018-08-25: 3 mL/kg/h via INTRAVENOUS

## 2018-08-25 MED ORDER — DIPHENHYDRAMINE HCL 50 MG/ML IJ SOLN
INTRAMUSCULAR | Status: AC
  Administered 2018-08-25: 50 mg via INTRAVENOUS
  Filled 2018-08-25: qty 1

## 2018-08-25 MED ORDER — MIDAZOLAM HCL 2 MG/2ML IJ SOLN
INTRAMUSCULAR | Status: AC
  Filled 2018-08-25: qty 2

## 2018-08-25 MED ORDER — FENTANYL CITRATE (PF) 100 MCG/2ML IJ SOLN
INTRAMUSCULAR | Status: DC | PRN
  Administered 2018-08-25: 25 ug via INTRAVENOUS

## 2018-08-25 MED ORDER — METHYLPREDNISOLONE SODIUM SUCC 125 MG IJ SOLR
125.0000 mg | Freq: Once | INTRAMUSCULAR | Status: AC
  Administered 2018-08-25: 125 mg via INTRAVENOUS

## 2018-08-25 SURGICAL SUPPLY — 8 items
CATH INFINITI 5FR ANG PIGTAIL (CATHETERS) ×3 IMPLANT
CATH INFINITI 5FR JL4 (CATHETERS) ×3 IMPLANT
CATH INFINITI JR4 5F (CATHETERS) ×3 IMPLANT
KIT MANI 3VAL PERCEP (MISCELLANEOUS) ×3 IMPLANT
NEEDLE PERC 18GX7CM (NEEDLE) ×3 IMPLANT
PACK CARDIAC CATH (CUSTOM PROCEDURE TRAY) ×3 IMPLANT
SHEATH AVANTI 5FR X 11CM (SHEATH) ×3 IMPLANT
WIRE GUIDERIGHT .035X150 (WIRE) ×3 IMPLANT

## 2018-08-25 NOTE — OR Nursing (Deleted)
Attempted to call report to [employer]. No answer multiple tries. Daughter to take pt home. She will give discharge instructions to nurse and our phone number in event she ahs any questions. Pt discharged in daughters care via personal wheelchair.

## 2018-08-25 NOTE — Discharge Instructions (Signed)
Chu?p ma?ch, Ch?m sc sau khi lm thu? thu?t  Angiogram, Care After  T? thng tin ny cung c?p cho qu v? thng tin v? cch t? ch?m sc b?n thn sau khi lm th? thu?t. Chuyn gia ch?m sc s?c kh?e c?ng c th? c h??ng d?n c? th? h?n cho qu v?. Hy lin l?c v?i chuyn gia ch?m sc s?c kh?e n?u qu v? c v?n ?? ho?c th?c m?c.  Ti c th? d? ki?n ?i?u g s? x?y ra sau khi lm th? thu?t?  Sau khi lm th? thu?t, th??ng s? c v?t b?m tm v nh?y c?m ?au ? vng ??t ?ng thng.  Tun th? nh?ng h??ng d?n ny ? nh:  Ch?m sc ch? ??t ?ng thng   Tun th? cc ch? d?n c?a chuyn gia ch?m sc s?c kh?e v? cch ch?m sc ch? ??t ?ng thng. ??m b?o qu v?:  ? R?a tay b?ng x phng v n??c tr??c khi qu v? thay b?ng (b?ng). N?u khng c x phng v n??c, hy dng thu?c st trng tay.  ? Thay b?ng theo ch? d?n c?a chuyn gia ch?m sc s?c kh?e.  ? ?? nguyn cc m?i khu (ch? ph?u thu?t), keo dn da ho?c cc d?i b?ng dnh t?i ch?. Nh?ng th? dng ?? ?ng v?t m? trn da ny c th? c?n ?? nguyn t?i ch? trong 2 tu?n ho?c lu h?n. N?u cc mp d?i b?ng dnh b?t ??u long ra v cong ln, qu v? c th? c?t nh?ng ch? mp b? long ra ?. Khng bc h?t d?i b?ng dnh ra tr? khi chuyn gia ch?m sc s?c kh?e b?o qu v? lm nh? v?y.   Khng t?m b?n, b?i, ho?c s? d?ng b?n t?m n??c nng cho ??n khi ???c chuyn gia ch?m sc s?c kh?e c?a qu v? ch?p thu?n.   Quy? vi? c th? t?m vo?i hoa sen sau th?i ?i?m th?c hi?n th? thu?t 24-48 gi? ho?c theo chi? d?n cu?a chuyn gia ch?m sc s?c kh?e.  ? Nh? nhng r?a ch? ??t ?ng thng b?ng x phng v n??c.  ? Th?m nh? b?ng kh?n s?ch ?? lm kh vng ny.  ? Khng xoa va?o ch? ??t ?ng thng. Lm nh? v?y c th? gy ch?y mu.   Khng bi thu?c b?t ho?c kem d??ng va?o ch? ???t ?ng thng. Gi? cho ch? ??t ?ng thng s?ch s? v kh.   Ki?m tra ch? ??t ?ng thng m?i ngy xem c d?u hi?u nhi?m trng khng. Ki?m tra xem c:  ? T?y ??, s?ng n? ho?c ?au.  ? Di?ch ho?c mu.  ? ?m.  ? M? ho?c mi hi.  Ho?t ??ng   Ngh?  ng?i theo ch? d?n c?a chuyn gian ch?m sc s?c kh?e, th??ng l trong 1-2 ngy.   Khng nng b?t k? v?t g n?ng h?n 10 lb. (4,5 kg) ho?c theo ch? d?n c?a chuyn gia ch?m sc s?c kh?e.   Khng li xe trong vng 24 gi? n?u qu v? ???c cho dng m?t lo?i thu?c gip th? gin (thu?c gi?m ?au an th?n).   Khng li xe ho?c v?n hnh my mc h?ng n?ng trong khi dng thu?c gi?m ?au ???c k ??n.  H??ng d?n chung   Tr? l?i sinh ho?t bnh th??ng theo ch? d?n c?a chuyn gia ch?m sc s?c kh?e, th??ng l kho?ng m?t tu?n. H?i chuyn gia ch?m sc s?c kh?e v? cc ho?t ??ng no an ton cho qu v?.   N?u ch? ??t ?ng thng b?t ??  u ch?y mu, hy n?m xu?ng v ?n vo ch? ?. N?u khng h?t ch?y mu, yu c?u tr? gip ngay. ?y l m?t ca c?p c?u.   U?ng ?? n??c ?? gi? cho n??c ti?u trong ho?c c mu vng nh?t. ?i?u ny gip lo?i b? thu?c nhu?m c?n quang ra kh?i c? th? qu v?.   Ch? s? d?ng thu?c khng k ??n v thu?c k ??n theo ch? d?n c?a chuyn gia ch?m sc s?c kh?e.   Tun th? t?t c? cc l?n khm theo di theo ch? d?n c?a chuyn gia ch?m sc s?c kh?e. ?i?u ny c vai tr quan tr?ng.  Hy lin l?c v?i chuyn gia ch?m sc s?c kh?e n?u:   Qu v? b? s?t ho?c ?n l?nh.   Qu v? b? ??, s?ng n? ho?c ?au xung quanh ch? ??t ?ng thng.   Qu v? c d?ch ho?c mu ch?y ra ? ch? ??t ?ng thng.   Ch? ??t ?ng thng c c?m gia?c ?m nng khi ch?m vo.   Qu v? th?y m? ho?c mi hi thot ra ? ch? ??t ?ng thng.   Qu v? c v?t b?m tm xung quanh ch? ??t ?ng thng.   Qu v? nh?n th?y mu tch t? trong m xung quanh ch? ??t ?ng thng (kh?i t? mu). Kh?i t? mu c th? ?au khi ch?m vo.  Yu c?u tr? gip ngay l?p t?c n?u:   Qu v? b? ?au r?t nhi?u ? vng ??t ?ng thng.   Vng ??t ?ng thng s?ng ln r?t nhanh.   Vng ???t ?ng thng bi? ch?y mu v ch?y mu khng ng?ng khi qu v? ?n lin t?c ln ch? ?o?.   Khu v?c g?n ho?c ngay trn ch? ???t ?ng thng bi? ti nh??t, la?nh, ?au bu?t ho?c t.  Nh?ng tri?u ch?ng ny c th? l bi?u hi?n c?a m?t v?n  ?? nghim tr?ng c?n c?p c?u. Khng ch? xem tri?u ch?ng c h?t khng. Hy ?i khm ngay l?p t?c. G?i cho d?ch v? c?p c?u t?i ??a ph??ng (911 ? [place] Khng t? li xe ??n b?nh vi?n.  Tm t?t   Sau khi lm th? thu?t, th??ng s? c v?t b?m tm v nh?y c?m ?au ? vng ??t ?ng thng.   Sau khi lm th? thu?t, ?i?u quan tr?ng l ngh? ng?i v u?ng nhi?u n??c.   Khng t?m b?n, b?i ho?c s? d?ng b?n t?m n??c nng cho ??n khi chuyn gia ch?m sc s?c kh?e ni r?ng qu v? c th? lm ?i?u ?. Quy? vi? c th? t?m vo?i hoa sen sau th?i ?i?m th?c hi?n th? thu?t 24-48 gi? ho?c theo chi? d?n cu?a chuyn gia ch?m sc s?c kh?e.   N?u ch? ??t ?ng thng b?t ??u ch?y mu, hy n?m xu?ng v ?n vo ch? ?. N?u khng h?t ch?y mu, yu c?u tr? gip ngay. ?y l m?t ca c?p c?u.  Thng tin ny khng nh?m m?c ?ch thay th? cho l?i khuyn m chuyn gia ch?m sc s?c kh?e ni v?i qu v?. Hy b?o ??m qu v? ph?i th?o lu?n b?t k? v?n ?? g m qu v? c v?i chuyn gia ch?m sc s?c kh?e c?a qu v?.  Document Released: 02/06/2016 Document Revised: 01/18/2017 Document Reviewed: 01/18/2017  Elsevier Interactive Patient Education  2018 [employer]

## 2018-08-26 ENCOUNTER — Encounter: Payer: Self-pay | Admitting: Cardiovascular Disease

## 2019-12-20 ENCOUNTER — Ambulatory Visit: Payer: Medicare Other | Attending: Internal Medicine

## 2019-12-20 DIAGNOSIS — Z23 Encounter for immunization: Secondary | ICD-10-CM

## 2019-12-20 NOTE — Progress Notes (Signed)
   Covid-19 Vaccination Clinic  Name:  Cody [name]    MRN: [id number] DOB: 22-Jul-1946  12/20/2019  Cody [name] was observed post Covid-19 immunization for 15 minutes without incidence. He was provided with Vaccine Information Sheet and instruction to access the V-Safe system.   Cody [name] was instructed to call 911 with any severe reactions post vaccine: Marland Kitchen Difficulty breathing  . Swelling of your face and throat  . A fast heartbeat  . A bad rash all over your body  . Dizziness and weakness    Immunizations Administered    Name Date Dose VIS Date Route   Pfizer COVID-19 Vaccine 12/20/2019  9:25 AM 0.3 mL 10/09/2019 Intramuscular   Manufacturer: [employer], [employer]   Lot: [id number]   NDC: [address]-7

## 2020-01-13 ENCOUNTER — Ambulatory Visit: Payer: Medicare Other | Attending: Internal Medicine

## 2020-01-13 DIAGNOSIS — Z23 Encounter for immunization: Secondary | ICD-10-CM

## 2020-01-13 NOTE — Progress Notes (Signed)
   Covid-19 Vaccination Clinic  Name:  Cody [name]    MRN: [id number] DOB: 1946/05/26  01/13/2020  Cody [name] was observed post Covid-19 immunization for 15 minutes without incident. He was provided with Vaccine Information Sheet and instruction to access the V-Safe system.   Cody [name] was instructed to call 911 with any severe reactions post vaccine: Marland Kitchen Difficulty breathing  . Swelling of face and throat  . A fast heartbeat  . A bad rash all over body  . Dizziness and weakness   Immunizations Administered    Name Date Dose VIS Date Route   Pfizer COVID-19 Vaccine 01/13/2020  8:38 AM 0.3 mL 10/09/2019 Intramuscular   Manufacturer: [employer], [employer]   Lot: [id number]   NDC: [address]-4

## 2021-08-21 ENCOUNTER — Encounter: Payer: Self-pay | Admitting: *Deleted

## 2021-08-31 ENCOUNTER — Other Ambulatory Visit: Payer: Self-pay

## 2021-08-31 ENCOUNTER — Encounter: Payer: Self-pay | Admitting: Urology

## 2021-08-31 ENCOUNTER — Ambulatory Visit (INDEPENDENT_AMBULATORY_CARE_PROVIDER_SITE_OTHER): Payer: Medicare Other | Admitting: Urology

## 2021-08-31 DIAGNOSIS — R972 Elevated prostate specific antigen [PSA]: Secondary | ICD-10-CM

## 2021-08-31 DIAGNOSIS — N410 Acute prostatitis: Secondary | ICD-10-CM

## 2021-08-31 DIAGNOSIS — N138 Other obstructive and reflux uropathy: Secondary | ICD-10-CM | POA: Diagnosis not present

## 2021-08-31 DIAGNOSIS — N401 Enlarged prostate with lower urinary tract symptoms: Secondary | ICD-10-CM | POA: Diagnosis not present

## 2021-08-31 DIAGNOSIS — R310 Gross hematuria: Secondary | ICD-10-CM | POA: Diagnosis not present

## 2021-08-31 LAB — MICROSCOPIC EXAMINATION
Bacteria, UA: NONE SEEN
Epithelial Cells (non renal): NONE SEEN /hpf (ref 0–10)

## 2021-08-31 LAB — URINALYSIS, COMPLETE
Bilirubin, UA: NEGATIVE
Glucose, UA: NEGATIVE
Ketones, UA: NEGATIVE
Leukocytes,UA: NEGATIVE
Nitrite, UA: NEGATIVE
Protein,UA: NEGATIVE
Specific Gravity, UA: <1.005 — ABNORMAL LOW (ref 1.005–1.030)
Urobilinogen, Ur: 0.2 mg/dL (ref 0.2–1.0)
pH, UA: 5.5 (ref 5.0–7.5)

## 2021-08-31 LAB — BLADDER SCAN AMB NON-IMAGING

## 2021-08-31 MED ORDER — TAMSULOSIN HCL 0.4 MG PO CAPS: 0.4000 mg | ORAL_CAPSULE | Freq: Every day | ORAL | 11 refills | Status: DC

## 2021-08-31 MED ORDER — SULFAMETHOXAZOLE-TRIMETHOPRIM 800-160 MG PO TABS: 1.0000 | ORAL_TABLET | Freq: Two times a day (BID) | ORAL | 0 refills | Status: DC

## 2021-08-31 NOTE — Patient Instructions (Signed)
Prostate-Specific Antigen Test Why am Cody [name] having this test? Cody [name] prostate-specific antigen (PSA) test is a screening test for prostate cancer. It can identify early signs of prostate cancer, which may allow for more effective treatment. Your health care provider may recommend that you have a PSA test starting at age 74 or that you have one earlier or later, depending on your risk factors for prostate cancer. You may also have a PSA test: To monitor treatment of prostate cancer. To check whether prostate cancer has returned after treatment. If you have signs of other conditions that can affect PSA levels, such as: An enlarged prostate that is Cody [name] caused by cancer (benign prostatic hyperplasia, BPH). This condition is very common in older men. A prostate infection. Cody is being tested? This test measures Cody [name] amount of PSA in your blood. PSA is a protein that is made in Cody [name] prostate. Cody [name] prostate naturally produces more PSA as you age, but very high levels may be a sign of a medical condition. Cody kind of sample is taken? A blood sample is required for this test. It is usually collected by inserting a needle into a blood vessel or by sticking a finger with a small needle. Blood for this test should be drawn before having an exam of Cody [name] prostate. How Cody Cody [name] prepare for this test? Cody Cody [name] ejaculate starting 24 hours before your test, or as long as told by your health care provider. Tell a health care provider about: Any allergies you have. All medicines you are taking, including vitamins, herbs, eye drops, creams, and over-Cody [name]-counter medicines. This also includes: Medicines to assist with hair growth, such as finasteride. Any recent exposure to a medicine called diethylstilbestrol. Any blood disorders you have. Any recent procedures you have had, especially any procedures involving Cody [name] prostate or rectum. Any medical conditions you have. Any recent urinary tract infections (UTIs) you have had. How are  Cody [name] results reported? Your test results will be reported as a value that indicates how much PSA is in your blood. This will be given as nanograms of PSA per milliliter of blood (ng/mL). Your health care provider will compare your results to normal ranges that were established after testing a large group of people (reference ranges). Reference ranges may vary among labs and hospitals. PSA levels vary from person to person and generally increase with age. Because of this variation, there is no single PSA value that is considered normal for everyone. Instead, PSA reference ranges are used to describe whether your PSA levels are considered low or high (elevated). Common reference ranges are: Low: 0-2.5 ng/mL. Slightly to moderately elevated: 2.6-10.0 ng/mL. Moderately elevated: 10.0-19.9 ng/mL. Significantly elevated: 20 ng/mL or greater. Sometimes, Cody [name] test results may report that a condition is present when it is Cody [name] present (false-positive result). Cody Cody [name]? A test result that is higher than 4 ng/mL may Cody [name] that you are at an increased risk for prostate cancer. However, a PSA test by itself is Cody [name] enough to diagnose prostate cancer. High PSA levels may also be caused by Cody [name] natural aging process, prostate infection, or BPH. PSA screening cannot tell you if your PSA is high due to cancer or a different cause. A prostate biopsy is Cody [name] only way to diagnose prostate cancer. A risk of having Cody [name] PSA test is diagnosing and treating prostate cancer that would never have caused any symptoms or problems (overdiagnosis and overtreatment). Talk with your health care provider about Cody your results Cody [name]. Questions   to ask your health care provider Ask your health care provider, or Cody [name] department that is doing Cody [name] test: When will my results be ready? How will Cody [name] get my results? Cody are my treatment options? Cody other tests Cody Cody [name] need? Cody are my next steps? Summary Cody [name] prostate-specific  antigen (PSA) test is a screening test for prostate cancer. Your health care provider may recommend that you have a PSA test starting at age 74 or that you have one earlier or later, depending on your risk factors for prostate cancer. A test result that is higher than 4 ng/mL may Cody [name] that you are at an increased risk for prostate cancer. However, elevated levels can be caused by a number of conditions other than prostate cancer. Talk with your health care provider about Cody your results Cody [name]. This information is Cody [name] intended to replace advice given to you by your health care provider. Make sure you discuss any questions you have with your health care provider. Document Revised: 06/30/2020 Document Reviewed: 06/30/2020 Cody Cody [name]  2022 [place].   Prostatitis Prostatitis is swelling or inflammation of Cody [name] prostate gland, also called Cody [name] prostate. This gland is about 1.5 inches wide and 1 inch high, and it is involved in making semen. Cody [name] prostate is located below a man's bladder, in front of Cody [name] rectum. There are four types of prostatitis: Chronic prostatitis (CP), also called chronic pelvic pain syndrome (CPPS). This is Cody [name] most common type of prostatitis. It is associated with increased muscle tone in Cody [name] area between Cody [name] hip bones (pelvic area), around Cody [name] prostate. This type is also known as a pelvic floor disorder. Chronic bacterial prostatitis. This type usually results from an acute bacterial infection in Cody [name] prostate gland that keeps coming back or has Cody [name] been treated properly. Cody [name] symptoms are less severe than those caused by acute bacterial prostatitis, which lasts a shorter time. Asymptomatic inflammatory prostatitis. This type does Cody [name] have symptoms and does Cody [name] need treatment. This is diagnosed when tests are done for other disorders of Cody [name] urinary tract or reproductive tract. Acute bacterial prostatitis. This type starts quickly and results from an acute bacterial  infection in Cody [name] prostate gland. It is usually associated with a bladder infection, high fever, and chills. This is Cody [name] least common type of prostatitis. Cody are Cody [name] causes? Bacterial prostatitis is caused by an infection from bacteria. Chronic nonbacterial prostatitis may be caused by: Factors related to Cody [name] nervous system. This system includes thebrain, spinal cord, and nerves. An autoimmune response. This happens when Cody [name] body's disease-fighting system attacks healthy tissue in Cody [name] body by mistake. Psychological factors. These have to [place] with how Cody [name] mind works. Cody [name] causes of Cody [name] other types of prostatitis are usually Cody [name] known. Cody are Cody [name] signs or symptoms? Symptoms of this condition depend on Cody [name] type of prostatitis you have. Acute bacterial prostatitis Symptoms may include: Pain or burning during urination. Frequent and sudden urges to urinate. Trouble starting to urinate. Fever. Chills. Pain in your muscles or joints, lower back, or lower abdomen. Other types of prostatitis Symptoms may include: Sudden urges to urinate, or urinating often. Trouble starting to urinate. Weak urine stream. Dribbling after urination. Discharge coming from Cody [name] penis. Pain in Cody [name] testicles, Cody [name] penis, or Cody [name] tip of Cody [name] penis. Pain in Cody [name] area in front of Cody [name] rectum and below Cody [name] scrotum (perineum). Pain when ejaculating. How is this diagnosed? This condition may be diagnosed based on: A physical and medical exam. A digital rectal exam.  For this, Cody [name] health care provider may use a finger to feel Cody [name] prostate. A urine test to check for bacteria. A semen sample or blood tests. Ultrasound. Urodynamic tests to check how your body handles urine. Cystoscopy to look inside your bladder or inside Cody [name] part of your body that drains urine from Cody [name] bladder (urethra). How is this treated? Treatment for this condition depends on Cody [name] type of prostatitis. Treatment may involve: Medicines to relieve pain or  inflammation, or to help relax your muscles. Physical therapy. Heat therapy. Biofeedback. These techniques help you control certain body functions. Relaxation Cody [name]. Antibiotic medicine, if your condition is caused by bacteria. Sitz baths. These warm water baths help to relax your pelvic floor muscles, which helps to relieve pressure on Cody [name] prostate. Follow these instructions at home: Medicines Take over-Cody [name]-counter and prescription medicines only as told by your health care provider. If you were prescribed an antibiotic medicine, take it as told by your health care provider. Cody Cody [name] stop using Cody [name] antibiotic even if you start to feel better. Managing pain and swelling  Take sitz baths as directed by your health care provider. For a sitz bath, sit in warm water that is deep enough to cover your hips and buttocks. If directed, apply heat to Cody [name] affected area as often as told by your health care provider. Use Cody [name] heat source that your health care provider recommends, such as a moist heat pack or a heating pad. Place a towel between your skin and Cody [name] heat source. Leave Cody [name] heat on for 20-30 minutes. Remove Cody [name] heat if your skin turns bright red. This is especially important if you are unable to feel pain, heat, or cold. You may have a greater risk of getting burned. General instructions Cody Cody [name] as told by your health care provider, if you were prescribed physical therapy, biofeedback, or relaxation Cody [name]. Keep all follow-up visits as told by your health care provider. This is important. Where to find more information [employer] [facility] Diabetes and Digestive and Kidney Diseases: [email or web address] Contact a health care provider if: Your symptoms get worse. You have a fever. Get help right away if: You have chills. You feel light-headed or feel like you may faint. You cannot urinate. You have blood or blood clots in your urine. Summary Prostatitis is swelling or  inflammation of Cody [name] prostate gland. Treatment for this condition depends on Cody [name] type of prostatitis. Take over-Cody [name]-counter and prescription medicines only as told by your health care provider. Get help right away of you have chills, feel light-headed, feel like you may faint, cannot urinate, or have blood or blood clots in your urine. This information is Cody [name] intended to replace advice given to you by your health care provider. Make sure you discuss any questions you have with your health care provider. Document Revised: 11/20/2019 Document Reviewed: 11/20/2019 Cody Cody [name]  2022 [employer]  Cystoscopy Cystoscopy is a procedure that is used to help diagnose and sometimes treat conditions that affect Cody [name] lower urinary tract. Cody [name] lower urinary tract includes Cody [name] bladder and Cody [name] urethra. Cody [name] urethra is Cody [name] tube that drains urine from Cody [name] bladder. Cystoscopy is done using a thin, tube-shaped instrument with a light and camera at Cody [name] end (cystoscope). Cody [name] cystoscope may be hard or flexible, depending on Cody [name] goal of Cody [name] procedure. Cody [name] cystoscope is inserted through Cody [name] urethra, into Cody [name] bladder. Cystoscopy may be recommended if you have: Urinary tract infections that keep coming back. Blood in Cody [name] urine (hematuria). An inability to  control when you urinate (urinary incontinence) or an overactive bladder. Unusual cells found in a urine sample. A blockage in Cody [name] urethra, such as a urinary stone. Painful urination. An abnormality in Cody [name] bladder found during an intravenous pyelogram (IVP) or CT scan. Cody are Cody [name] risks? Generally, this is a safe procedure. However, problems may occur, including: Infection. Bleeding.  Cody happens during Cody [name] procedure?  You will be given one or more of Cody [name] following: A medicine to numb Cody [name] area (local anesthetic). Cody [name] area around Cody [name] opening of your urethra will be cleaned. Cody [name] cystoscope will be passed through your urethra into your  bladder. Germ-free (sterile) fluid will flow through Cody [name] cystoscope to fill your bladder. Cody [name] fluid will stretch your bladder so that your health care provider can clearly examine your bladder walls. Your doctor will look at Cody [name] urethra and bladder. Cody [name] cystoscope will be removed Cody [name] procedure may vary among health care providers  Cody can Cody [name] expect after Cody [name] procedure? After Cody [name] procedure, it is common to have: Some soreness or pain in your urethra. Urinary symptoms. These include: Mild pain or burning when you urinate. Pain should stop within a few minutes after you urinate. This may last for up to a few days after Cody [name] procedure. A small amount of blood in your urine for several days. Feeling like you need to urinate but producing only a small amount of urine. Follow these instructions at home: General instructions Return to your normal activities as told by your health care provider.  Drink plenty of fluids after Cody [name] procedure. Keep all follow-up visits as told by your health care provider. This is important. Contact a health care provider if you: Have pain that gets worse or does Cody [name] get better with medicine, especially pain when you urinate lasting longer than 72 hours after Cody [name] procedure. Have trouble urinating. Get help right away if you: Have blood clots in your urine. Have a fever or chills. Are unable to urinate. Summary Cystoscopy is a procedure that is used to help diagnose and sometimes treat conditions that affect Cody [name] lower urinary tract. Cystoscopy is done using a thin, tube-shaped instrument with a light and camera at Cody [name] end. After Cody [name] procedure, it is common to have some soreness or pain in your urethra. It is normal to have blood in your urine after Cody [name] procedure.  If you were prescribed an antibiotic medicine, take it as told by your health care provider.  This information is Cody [name] intended to replace advice given to you by your health care provider. Make sure you  discuss any questions you have with your health care provider. Document Revised: 10/07/2018 Document Reviewed: 10/07/2018 Cody Cody [name]  [employer].

## 2021-08-31 NOTE — Progress Notes (Signed)
08/31/21 9:39 AM   Cody [name] May 03, 1946 [id number]  CC: Elevated PSA, urinary symptoms, gross hematuria  HPI: 75 year old male with cardiac history and alcohol abuse here with his daughter today who helps provide much of the history.  He was referred from [place] for elevated PSA, urinary symptoms, and a single episode of gross hematuria.  He reports at least a few months of weak urinary stream and some incontinence, including incontinence overnight.  He has never tried any medications for this.  There are no prior urinalyses to review, but he had an elevated PSA of 29 on 07/14/2021, which increased further to 54 on 08/04/2021.  Unclear if he was given any antibiotics at that time.  He also reports a single episode of gross hematuria with straining about a month ago that has since resolved.  He denies any pelvic pain or dysuria.  He has a 30+ pack year smoking history.  Urinalysis today is completely benign with 0-5 WBCs, 0-2 RBCs, no bacteria, no yeast, nitrite negative, no leukocytes, PVR mildly elevated at 178 mL.  I [facility] that shows normal renal function and normal CBC.   PMH: Past Medical History:  Diagnosis Date   Alcohol dependence ([place])    CHF (congestive heart failure) ([place])    Hypertension    Tobacco abuse     Surgical History: Past Surgical History:  Procedure Laterality Date   APPENDECTOMY     LEFT HEART CATH AND CORONARY ANGIOGRAPHY N/A 08/25/2018   Procedure: LEFT HEART CATH AND CORONARY ANGIOGRAPHY;  Surgeon: Dionisio David, MD;  Location: [facility];  Service: Cardiovascular;  Laterality: N/A;     Social History:  reports that he has been smoking cigarettes. He has been smoking an average of .5 packs per day. He has never used smokeless tobacco. He reports current alcohol use of about 35.0 standard drinks per week. He reports that he does not use drugs.  Physical Exam:  Constitutional:  Alert and  oriented, No acute distress. Cardiovascular: No clubbing, cyanosis, or edema. Respiratory: Normal respiratory effort, no increased work of breathing. GI: Abdomen is soft, nontender, nondistended, no abdominal masses GU: Widely patent meatus, no lesions DRE: 80 g, smooth, no nodules or masses  Laboratory Data: Reviewed, see HPI  Pertinent Imaging: None to review  Assessment & Plan:   75 year old male with urinary symptoms of weak stream, incontinence, and frequency, single episode of gross hematuria with straining about a month ago, and significantly elevated PSA of 29 in September 2022, with increased to 21 and October 2022.  DRE enlarged prostate but benign, urinalysis completely benign today and PVR mildly elevated at 178 mL.  We had a long conversation today about possible etiologies including prostatitis, BPH, prostate cancer, bladder cancer, or other malignancy.  I recommended further evaluation with upper tract imaging with CT, as well as cystoscopy for his episode of gross hematuria, and prostate biopsy for evaluation of his significantly elevated PSA.  They are averse to any invasive procedures at this time, but they are amenable to a trial of antibiotics and Flomax with close follow-up in 3 weeks for repeat PSA.  If PSA remains elevated will again strongly recommend prostate biopsy, but if downtrending can fall to normal for possible prostatitis.  I also recommended starting Flomax for his incomplete bladder emptying and urinary symptoms, again with low threshold to pursue cystoscopy and CT urogram for further evaluation if patient and family amenable.  -Patient deferred  cystoscopy, prostate biopsy, and upper tract imaging -Bactrim DS twice daily x2 weeks for possible prostatitis, close follow-up in 3 weeks for repeat PSA, repeat PVR -Trial of Flomax for incomplete bladder emptying -Will again offer cystoscopy, upper tract imaging, and potentially prostate biopsy at follow-up pending  PSA trend    Legrand Rams, MD 08/31/2021  [facility] [address], [address] [place], [place] [address] [phone]

## 2021-09-25 ENCOUNTER — Other Ambulatory Visit: Payer: Self-pay

## 2021-09-25 ENCOUNTER — Other Ambulatory Visit: Payer: Medicare Other

## 2021-09-25 DIAGNOSIS — N410 Acute prostatitis: Secondary | ICD-10-CM

## 2021-09-26 LAB — PSA TOTAL (REFLEX TO FREE): Prostate Specific Ag, Serum: 26.8 ng/mL — ABNORMAL HIGH (ref 0.0–4.0)

## 2021-09-27 ENCOUNTER — Encounter: Payer: Self-pay | Admitting: Urology

## 2021-09-27 ENCOUNTER — Other Ambulatory Visit: Payer: Self-pay

## 2021-09-27 ENCOUNTER — Ambulatory Visit (INDEPENDENT_AMBULATORY_CARE_PROVIDER_SITE_OTHER): Payer: Medicare Other | Admitting: Urology

## 2021-09-27 DIAGNOSIS — R310 Gross hematuria: Secondary | ICD-10-CM | POA: Diagnosis not present

## 2021-09-27 DIAGNOSIS — N401 Enlarged prostate with lower urinary tract symptoms: Secondary | ICD-10-CM | POA: Diagnosis not present

## 2021-09-27 DIAGNOSIS — R972 Elevated prostate specific antigen [PSA]: Secondary | ICD-10-CM | POA: Diagnosis not present

## 2021-09-27 DIAGNOSIS — N138 Other obstructive and reflux uropathy: Secondary | ICD-10-CM | POA: Diagnosis not present

## 2021-09-27 LAB — BLADDER SCAN AMB NON-IMAGING

## 2021-09-27 NOTE — Progress Notes (Signed)
   09/27/2021 11:04 AM   Cody [name] 12/30/1945 [id number]  Reason for visit: Follow up elevated PSA, urinary symptoms, gross hematuria  HPI: I saw [name] and his daughter back in clinic for the above issues.  He has a cardiac history and history of alcohol abuse and was referred for an elevated PSA 54 with urinary symptoms and gross hematuria.  Primary urinary symptoms or weak stream and incontinence, primarily incontinence overnight.  Urinalysis at that visit was benign, and PVR was mildly elevated at 178 mL.  We had previously discussed options for elevated PSA and hematuria including work-up with prostate biopsy, cystoscopy, and CT urogram, but they deferred more invasive treatments, and we ultimately opted for a 2-week course of Bactrim for a possible prostatitis, and trial of Flomax.  He reports significant improvement in his urinary symptoms, and is having only minimal leakage at this time and his stream is much better.  PVR stable at 179 mL.  His PSA also decreased to 26.8 from 54 after antibiotics.  He denies any further gross hematuria after the antibiotics.  We discussed possible causes of the PSA elevation including prostatitis or prostate cancer, and we again discussed prostate biopsy.  They would like to repeat the PSA in 1 month to see if this was a false elevation from prostatitis prior to pursuing biopsy.  DRE at our last visit showed an enlarged prostate with no nodules or masses.  RTC 6 weeks with PSA prior, if remains significantly elevated pursue prostate biopsy Continue Flomax   Sondra Come, MD  [facility] [address] [place], [place] [address] [phone]

## 2021-09-27 NOTE — Patient Instructions (Signed)
Prostate-Specific Antigen Test °Why am I having this test? °The prostate-specific antigen (PSA) test is a screening test for prostate cancer. It can identify early signs of prostate cancer, which may allow for early detection and more effective treatment. Your health care provider may recommend that you have a PSA test starting at age 74 or that you have one earlier if you are at higher risk for prostate cancer. You may also have a PSA test: °To monitor treatment of prostate cancer. °To check whether prostate cancer has returned after treatment. °What is being tested? °This test measures the amount of PSA in your blood. PSA is a protein that is made in the prostate. The prostate naturally produces more PSA as you age, but very high levels may be a sign of a medical condition. °What kind of sample is taken? °A blood sample is required for this test. It is usually collected by inserting a needle into a blood vessel but can also be collected by sticking a finger with a small needle. Blood for this test should be drawn before having an exam of the prostate that involves digital rectal examination to avoid affecting the results. °How Cody [name] I prepare for this test? °Cody [name] not ejaculate starting 24 hours before your test, or as long as told by your health care provider, as this can cause an elevation in PSA. °Cody [name] not undergo any procedures that require manipulation of the prostate, such as biopsy or surgery, for 6 weeks before the test is done as this can cause an elevation in PSA. °Tell a health care provider about: °Any signs you may have of other conditions that can affect PSA levels, such as: °An enlarged prostate that is not caused by cancer (benign prostatic hyperplasia, or BPH). This condition is very common in older men. °A prostate or urinary tract infection. °Any allergies you have. °All medicines you are taking, including vitamins, herbs, eye drops, creams, and over-the-counter medicines. This also includes: °Medicines  to assist with hair growth, such as finasteride. °Any recent exposure to a medicine called diethylstilbestrol (DES). °Medicines such as male hormones (like testosterone) or other medicines that raise testosterone levels. °Any bleeding problems you have. °Any recent procedures you have had, especially any procedures involving the prostate or rectum. °Any medical conditions you have. °How are the results reported? °Your test results will be reported as a value that indicates how much PSA is in your blood. This will be given as nanograms of PSA per milliliter of blood (ng/mL). Your health care provider will compare your results to normal ranges that were established after testing a large group of people (reference ranges). Reference ranges may vary among labs and hospitals. °PSA levels vary from person to person and generally increase with age. Because of this variation, there is no single PSA value that is considered normal for everyone. Instead, PSA reference ranges are used to describe whether your PSA levels are considered low or high (elevated). Common reference ranges are: °Low: 0-2.5 ng/mL. °Slightly to moderately elevated: 2.6-10.0 ng/mL. °Moderately elevated: 10.0-19.9 ng/mL. °Significantly elevated: 20 ng/mL or greater. °What Cody [name] the results mean? °A test result that is higher than 4 ng/mL may mean that you have prostate cancer. However, a PSA test by itself is not enough to diagnose prostate cancer. High PSA levels may also be caused by the natural aging process, prostate infection (prostatitis), or BPH. °PSA screening cannot tell you if your PSA is high due to cancer or a different cause. °A prostate biopsy   is the only way to diagnose prostate cancer. °A risk of having the PSA test is diagnosing and treating prostate cancer that would never have caused any symptoms or problems (overdiagnosis and overtreatment). °Talk with your health care provider about what your results mean. In some cases, your health care  provider may Cody [name] more testing to confirm the results. °Questions to ask your health care provider °Ask your health care provider, or the department that is doing the test: °When will my results be ready? °How will I get my results? °What are my treatment options? °What other tests Cody [name] I need? °What are my next steps? °Summary °The prostate-specific antigen (PSA) test is a screening test for prostate cancer. °Your health care provider may recommend that you have a PSA test starting at age 74 or that you have one earlier if you are at higher risk for prostate cancer. °A test result that is higher than 4 ng/mL may mean that you have prostate cancer. However, elevated levels can be caused by a number of conditions other than prostate cancer. °Talk with your health care provider about what your results mean. °This information is not intended to replace advice given to you by your health care provider. Make sure you discuss any questions you have with your health care provider. °Document Revised: 02/22/2021 Document Reviewed: 02/22/2021 °Elsevier Patient Education © 2022 Elsevier Inc. ° °

## 2021-10-17 ENCOUNTER — Ambulatory Visit
Admission: RE | Admit: 2021-10-17 | Discharge: 2021-10-17 | Disposition: A | Discharge status: Home / Self Care | Payer: Medicare Other | Source: Ambulatory Visit | Attending: Nurse Practitioner | Admitting: Nurse Practitioner

## 2021-10-17 ENCOUNTER — Ambulatory Visit
Admission: RE | Admit: 2021-10-17 | Discharge: 2021-10-17 | Disposition: A | Discharge status: Home / Self Care | Payer: Medicare Other | Attending: Nurse Practitioner | Admitting: Nurse Practitioner

## 2021-10-17 ENCOUNTER — Other Ambulatory Visit: Payer: Self-pay | Admitting: Nurse Practitioner

## 2021-10-17 ENCOUNTER — Other Ambulatory Visit: Payer: Self-pay

## 2021-10-17 DIAGNOSIS — R52 Pain, unspecified: Secondary | ICD-10-CM | POA: Insufficient documentation

## 2021-10-17 DIAGNOSIS — M25541 Pain in joints of right hand: Secondary | ICD-10-CM

## 2021-10-24 ENCOUNTER — Other Ambulatory Visit: Payer: Medicare Other

## 2021-10-24 ENCOUNTER — Other Ambulatory Visit: Payer: Self-pay

## 2021-10-24 DIAGNOSIS — R972 Elevated prostate specific antigen [PSA]: Secondary | ICD-10-CM

## 2021-10-25 LAB — PSA TOTAL (REFLEX TO FREE): Prostate Specific Ag, Serum: 30.4 ng/mL — ABNORMAL HIGH (ref 0.0–4.0)

## 2021-10-26 ENCOUNTER — Other Ambulatory Visit: Payer: Medicare Other

## 2021-10-26 ENCOUNTER — Ambulatory Visit: Payer: Medicare Other | Admitting: Urology

## 2021-10-27 ENCOUNTER — Encounter: Payer: Self-pay | Admitting: Urology

## 2021-10-27 ENCOUNTER — Other Ambulatory Visit: Payer: Self-pay

## 2021-10-27 ENCOUNTER — Ambulatory Visit (INDEPENDENT_AMBULATORY_CARE_PROVIDER_SITE_OTHER): Payer: Medicare Other | Admitting: Urology

## 2021-10-27 VITALS — BP 166/83 | HR 81 | Ht <= 58 in | Wt 107.7 lb

## 2021-10-27 DIAGNOSIS — R972 Elevated prostate specific antigen [PSA]: Secondary | ICD-10-CM | POA: Diagnosis not present

## 2021-10-27 DIAGNOSIS — N401 Enlarged prostate with lower urinary tract symptoms: Secondary | ICD-10-CM | POA: Diagnosis not present

## 2021-10-27 DIAGNOSIS — N138 Other obstructive and reflux uropathy: Secondary | ICD-10-CM

## 2021-10-27 NOTE — Patient Instructions (Addendum)
Prostate Biopsy Instructions  Stop all aspirin or blood thinners (aspirin, plavix, coumadin, warfarin, motrin, ibuprofen, advil, aleve, naproxen, naprosyn) for 7 days prior to the procedure.  If you have any questions about stopping these medications, please contact your primary care physician or cardiologist.  Having a light meal prior to the procedure is recommended.  If you are diabetic or have low blood sugar please bring a small snack or glucose tablet.  A Fleets enema is needed to be purchased over the counter at a local pharmacy and used 2 hours before you scheduled appointment.  This can be purchased over the counter at any pharmacy.  Antibiotics will be administered in the clinic at the time of the procedure unless otherwise specified.    Please bring someone with you to the procedure to drive you home.  A follow up appointment has been scheduled for you to receive the results of the biopsy.  If you have any questions or concerns, please feel free to call the office at [phone] or send a Mychart message.    Thank you, Staff at [facility]   Transrectal Ultrasound-Guided Prostate Biopsy A transrectal ultrasound-guided prostate biopsy is a procedure to remove samples of prostate tissue for testing. The prostate is a walnut-sized gland that is located below the bladder and in front of the rectum. During this procedure, a small device (probe) is lubricated and put inside the rectum. The probe sends out sound waves that make a picture of the prostate and surrounding tissues (transrectal ultrasound). The images are used to help guide the process of removing the samples. The samples are taken to a lab to be checked for prostate cancer. This procedure is usually done to evaluate the prostate gland of men who have raised (elevated) levels of prostate-specific antigen (PSA), which can be a sign of prostate cancer or prostate enlargement related to aging (benign prostatic  hyperplasia, or BPH). Tell a health care provider about: Any allergies you have. All medicines you are taking, including vitamins, herbs, eye drops, creams, and over-the-counter medicines. Any problems you or family members have had with anesthetic medicines. Any bleeding problems you have. Any surgeries you have had. Any medical conditions you have. Any prostate infections you have had. What are the risks? Generally, this is a safe procedure. However, problems may occur, including: Prostate infection. Bleeding from the rectum. Blood in the urine. Allergic reactions to medicines. Damage to surrounding structures such as blood vessels, organs, or muscles. Difficulty passing urine. Nerve damage. This is usually temporary. What happens before the procedure? Medicines Ask your health care provider about: Changing or stopping your regular medicines. This is especially important if you are taking diabetes medicines or blood thinners. Taking medicines such as aspirin and ibuprofen. These medicines can thin your blood. Remer not take these medicines unless your health care provider tells you to take them. Taking over-the-counter medicines, vitamins, herbs, and supplements. General instructions Follow instructions from your health care provider about eating and drinking. In most instances, you will not need to stop eating and drinking completely before the procedure. You will be given an enema. During an enema, a liquid is injected into your rectum to clear out waste. You may have a blood or urine sample taken. Ask your health care provider what steps will be taken to help prevent infection. These steps may include: Washing skin with a germ-killing soap. Taking antibiotic medicine. If you will be going home right after the procedure, plan to have a responsible  adult: Take you home from the hospital or clinic. You will not be allowed to drive. Care for you for the time you are told. What happens  during the procedure?   You will be given one or both of the following: A medicine to help you relax (sedative). A medicine to numb the area (local anesthetic). You will be placed on your left side, and your knees will be bent toward your chest. A probe with lubricated gel will be placed into your rectum, and images will be taken of your prostate and surrounding structures. Numbing medicine will be injected into your prostate. A biopsy needle will be inserted through your rectum or perineum and guided to your prostate using the ultrasound images. Prostate tissue samples will be removed, and the needle and probe will then be removed. The biopsy samples will be sent to a lab to be tested. The procedure may vary among health care providers and hospitals. What happens after the procedure? Your blood pressure, heart rate, breathing rate, and blood oxygen level will be monitored until you leave the hospital or clinic. You may have some discomfort in the rectal area. You will be given pain medicine as needed. If you were given a sedative during the procedure, it can affect you for several hours. Keandre not drive or operate machinery until your health care provider says that it is safe. It is up to you to get the results of your procedure. Ask your health care provider, or the department that is doing the procedure, when your results will be ready. Keep all follow-up visits. This is important. Summary A transrectal ultrasound-guided biopsy removes samples of tissue from your prostate using ultrasound-guided sound waves to help guide the process. This procedure is usually done to evaluate the prostate gland of men who have raised (elevated) levels of prostate-specific antigen (PSA), which can be a sign of prostate cancer or prostate enlargement related to aging. After your procedure, you may feel some discomfort in the rectal area. Plan to have a responsible adult take you home from the hospital or clinic,  and follow up with your health care provider for your results. This information is not intended to replace advice given to you by your health care provider. Make sure you discuss any questions you have with your health care provider. Document Revised: 04/10/2021 Document Reviewed: 04/10/2021 Elsevier Patient Education  2022 [employer].

## 2021-10-27 NOTE — Progress Notes (Signed)
° °  10/27/2021 3:53 PM   Cody [name] [birth year] [id number]  Reason for visit: Follow up elevated PSA, urinary symptoms  HPI: 75 year old male with a cardiac history and history of alcohol abuse who was originally referred for an elevated PSA of 54 with urinary symptoms and gross hematuria.  Urinalysis was benign and PVR was mildly elevated at 178, and we opted for a trial of antibiotics and Flomax to see if this was a possible prostatitis.  He had significant improvements on the antibiotics and the Flomax and is having no significant leakage at this time and feels like his urination improved significantly.  His PSA decreased to 26.8 from 54 after the antibiotics, and DRE showed an enlarged prostate but no nodules or masses.  With the significantly elevated PSA after antibiotics, I recommended repeating a PSA in 1 month, which returned significantly persistently elevated at 30.4.  His gross hematuria resolved after antibiotics, and urinalysis at our last visit was completely benign.  We reviewed the implications of an elevated PSA and the uncertainty surrounding it. In general, a man's PSA increases with age and is produced by both normal and cancerous prostate tissue. The differential diagnosis for elevated PSA includes BPH, prostate cancer, infection, recent intercourse/ejaculation, recent urethroscopic manipulation (foley placement/cystoscopy) or trauma, and prostatitis.   Management of an elevated PSA can include observation or prostate biopsy and we discussed this in detail. Our goal is to detect clinically significant prostate cancers, and manage with either active surveillance, surgery, or radiation for localized disease. Risks of prostate biopsy include bleeding, infection (including life threatening sepsis), pain, and lower urinary symptoms. Hematuria, hematospermia, and blood in the stool are all common after biopsy and can persist up to 4 weeks.   Return for prostate biopsy   Sondra Come, MD  [facility] [address] [place], [place] [address] [phone]

## 2021-11-06 ENCOUNTER — Encounter: Payer: Self-pay | Admitting: Pharmacy Technician

## 2021-11-06 ENCOUNTER — Emergency Department: Payer: Medicare Other

## 2021-11-06 ENCOUNTER — Other Ambulatory Visit: Payer: Self-pay

## 2021-11-06 ENCOUNTER — Emergency Department
Admission: EM | Admit: 2021-11-06 | Discharge: 2021-11-06 | Disposition: A | Discharge status: Home / Self Care | Payer: Medicare Other | Attending: Emergency Medicine | Admitting: Emergency Medicine

## 2021-11-06 DIAGNOSIS — M1120 Other chondrocalcinosis, unspecified site: Secondary | ICD-10-CM

## 2021-11-06 DIAGNOSIS — M11261 Other chondrocalcinosis, right knee: Secondary | ICD-10-CM | POA: Insufficient documentation

## 2021-11-06 DIAGNOSIS — M79671 Pain in right foot: Secondary | ICD-10-CM | POA: Diagnosis present

## 2021-11-06 LAB — COMPREHENSIVE METABOLIC PANEL
ALT: 15 U/L (ref 0–44)
AST: 20 U/L (ref 15–41)
Albumin: 3.2 g/dL — ABNORMAL LOW (ref 3.5–5.0)
Alkaline Phosphatase: 52 U/L (ref 38–126)
Anion gap: 7 (ref 5–15)
BUN: 12 mg/dL (ref 8–23)
CO2: 28 mmol/L (ref 22–32)
Calcium: 8.9 mg/dL (ref 8.9–10.3)
Chloride: 105 mmol/L (ref 98–111)
Creatinine, Ser: 0.85 mg/dL (ref 0.61–1.24)
GFR, Estimated: >60 mL/min (ref >60)
Glucose, Bld: 112 mg/dL — ABNORMAL HIGH (ref 70–99)
Potassium: 4.4 mmol/L (ref 3.5–5.1)
Sodium: 140 mmol/L (ref 135–145)
Total Bilirubin: 0.5 mg/dL (ref 0.3–1.2)
Total Protein: 7.2 g/dL (ref 6.5–8.1)

## 2021-11-06 LAB — CBC WITH DIFFERENTIAL/PLATELET
Abs Immature Granulocytes: 0.03 10*3/uL (ref 0.00–0.07)
Basophils Absolute: 0 10*3/uL (ref 0.0–0.1)
Basophils Relative: 0 %
Eosinophils Absolute: 0.7 10*3/uL — ABNORMAL HIGH (ref 0.0–0.5)
Eosinophils Relative: 9 %
HCT: 35.9 % — ABNORMAL LOW (ref 39.0–52.0)
Hemoglobin: 11.8 g/dL — ABNORMAL LOW (ref 13.0–17.0)
Immature Granulocytes: 0 %
Lymphocytes Relative: 25 %
Lymphs Abs: 2 10*3/uL (ref 0.7–4.0)
MCH: 33.1 pg (ref 26.0–34.0)
MCHC: 32.9 g/dL (ref 30.0–36.0)
MCV: 100.8 fL — ABNORMAL HIGH (ref 80.0–100.0)
Monocytes Absolute: 0.9 10*3/uL (ref 0.1–1.0)
Monocytes Relative: 12 %
Neutro Abs: 4.2 10*3/uL (ref 1.7–7.7)
Neutrophils Relative %: 54 %
Platelets: 284 10*3/uL (ref 150–400)
RBC: 3.56 MIL/uL — ABNORMAL LOW (ref 4.22–5.81)
RDW: 12.4 % (ref 11.5–15.5)
WBC: 7.8 10*3/uL (ref 4.0–10.5)
nRBC: 0 % (ref 0.0–0.2)

## 2021-11-06 LAB — URIC ACID: Uric Acid, Serum: 5.2 mg/dL (ref 3.7–8.6)

## 2021-11-06 MED ORDER — ONDANSETRON 4 MG PO TBDP
4.0000 mg | ORAL_TABLET | Freq: Once | ORAL | Status: AC
  Administered 2021-11-06: 4 mg via ORAL
  Filled 2021-11-06: qty 1

## 2021-11-06 MED ORDER — COLCHICINE 0.6 MG PO TABS: 0.6000 mg | ORAL_TABLET | Freq: Every day | ORAL | 2 refills | Status: DC

## 2021-11-06 MED ORDER — HYDROCODONE-ACETAMINOPHEN 5-325 MG PO TABS
1.0000 | ORAL_TABLET | Freq: Once | ORAL | Status: AC
  Administered 2021-11-06: 1 via ORAL
  Filled 2021-11-06: qty 1

## 2021-11-06 NOTE — ED Provider Triage Note (Signed)
Emergency Medicine Provider Triage Evaluation Note  Cody [name] , a 76 y.o. male  was evaluated in triage.  Patient has a history of hypertension, tobacco use, alcohol dependence and CHF.  Pt complains of left foot pain and right thumb pain.  Patient has had edema of the left foot and the right thumb for the past week.  No fever or chills at home.  Patient's daughter thinks that he might have been diagnosed with gout in the past. No chest pain, chest tightness or abdominal pain.   Review of Systems  Positive: Patient has left foot pain and right thumb pain.  Negative:   Physical Exam  BP (!) 144/64    Pulse 70    Temp 98.9 F (37.2 C)    Resp 18    SpO2 100%  Gen:   Awake, no distress   Resp:  Normal effort  MSK:   Moves extremities without difficulty  Other:    Medical Decision Making  Medically screening exam initiated at 5:26 PM.  Appropriate orders placed.  Cody [name] was informed that the remainder of the evaluation will be completed by another provider, this initial triage assessment does not replace that evaluation, and the importance of remaining in the ED until their evaluation is complete.     Pia Mau [place], [place] 11/06/21 1728

## 2021-11-06 NOTE — ED Triage Notes (Signed)
Pt bib family with reports of R thumb edema and pain X1 week. Pt also with edema to L foot for approx 2 weeks. Pt seen by PCP about foot and was told it was arthritis. Pt states he is unable to tolerate the pain.

## 2021-11-06 NOTE — Discharge Instructions (Signed)
Take 2 tablets of colchicine.  Wait 1 hour and then take 1 more tablet. Take 1 tablet daily on days 2 through 5.

## 2021-11-14 NOTE — ED Provider Notes (Signed)
[facility] Provider Note  Patient Contact: 5:06 PM (approximate)   History   Foot Swelling   HPI  Cody [name] is a 76 y.o. male presents to the emergency department with right thumb and left foot pain that has occurred off and on for the past 2 weeks.  He has had some erythema and edema.  Patient's daughter unsure if patient has been diagnosed with gout in the past.  Patient has a diet heavy and seafood and red meat.  He uses alcohol and tobacco daily.  No fever or chills at home.  No chest pain or abdominal pain.      Physical Exam   Triage Vital Signs: ED Triage Vitals  Enc Vitals Group     BP 11/06/21 1713 (!) 144/64     Pulse Rate 11/06/21 1713 70     Resp 11/06/21 1713 18     Temp 11/06/21 1713 98.9 F (37.2 C)     Temp src --      SpO2 11/06/21 1713 100 %     Weight --      Height --      Head Circumference --      Peak Flow --      Pain Score 11/06/21 1714 8     Pain Loc --      Pain Edu? --      Excl. in GC? --     Most recent vital signs: Vitals:   11/06/21 1713 11/06/21 2249  BP: (!) 144/64 130/68  Pulse: 70 72  Resp: 18 18  Temp: 98.9 F (37.2 C)   SpO2: 100% 100%     General: Alert and in no acute distress. Eyes:  PERRL. EOMI. Head: No acute traumatic findings ENT:      Ears:       Nose: No congestion/rhinnorhea.      Mouth/Throat: Mucous membranes are moist. Neck: No stridor. No cervical spine tenderness to palpation. Hematological/Lymphatic/Immunilogical: No cervical lymphadenopathy. Cardiovascular:  Good peripheral perfusion Respiratory: Normal respiratory effort without tachypnea or retractions. Lungs CTAB. Good air entry to the bases with no decreased or absent breath sounds. Gastrointestinal: Bowel sounds 4 quadrants. Soft and nontender to palpation. No guarding or rigidity. No palpable masses. No distention. No CVA tenderness. Musculoskeletal: Full range of motion to all extremities.  Neurologic:  No gross  focal neurologic deficits are appreciated.  Skin: Patient has edema and erythema along the dorsal aspect of the right thumb and the left foot.  Palpable dorsalis pedis pulse bilaterally and symmetrically.  Capillary refill less than 2 seconds bilaterally and symmetrically. Other:   ED Results / Procedures / Treatments   Labs (all labs ordered are listed, but only abnormal results are displayed) Labs Reviewed  CBC WITH DIFFERENTIAL/PLATELET - Abnormal; Notable for the following components:      Result Value   RBC 3.56 (*)    Hemoglobin 11.8 (*)    HCT 35.9 (*)    MCV 100.8 (*)    Eosinophils Absolute 0.7 (*)    All other components within normal limits  COMPREHENSIVE METABOLIC PANEL - Abnormal; Notable for the following components:   Glucose, Bld 112 (*)    Albumin 3.2 (*)    All other components within normal limits  URIC ACID        RADIOLOGY  I personally viewed and evaluated these images as part of my medical decision making, as well as reviewing the written report by the radiologist.  ED  Provider Interpretation: I personally reviewed venous ultrasound and x-ray of the right hand.  There is no evidence of DVT of the left lower extremity or findings concerning for osteomyelitis, fracture or foreign body of the right hand.      MEDICATIONS ORDERED IN ED: Medications  HYDROcodone-acetaminophen (NORCO/VICODIN) 5-325 MG per tablet 1 tablet (1 tablet Oral Given 11/06/21 2245)  ondansetron (ZOFRAN-ODT) disintegrating tablet 4 mg (4 mg Oral Given 11/06/21 2245)     IMPRESSION / MDM / ASSESSMENT AND PLAN / ED COURSE  I reviewed the triage vital signs and the nursing notes.                              Differential diagnosis includes, but is not limited to, gout, pseudogout, cellulitis...  Assessment and plan Pseudogout 76 year old male presents to the emergency department with erythema and edema along the dorsal aspect of the right thumb and the left foot.  Vital signs  were reassuring at triage.  On physical exam, patient was alert, active and nontoxic-appearing.  CBC showed normal white blood cell count.  CMP within reference range.  Uric acid level within reference range.  Suspicious for pseudogout.  Low suspicion for cellulitis given absence of fever and normal white blood cell count.  We will treat with colchicine and have patient follow-up with primary care.      FINAL CLINICAL IMPRESSION(S) / ED DIAGNOSES   Final diagnoses:  Pseudogout     Rx / DC Orders   ED Discharge Orders          Ordered    colchicine 0.6 MG tablet  Daily,   Status:  Discontinued        11/06/21 2218    colchicine 0.6 MG tablet  Daily        11/06/21 2255             Note:  This document was prepared using Dragon voice recognition software and may include unintentional dictation errors.   Pia Mau [place], PA-C 11/14/21 1711    Charlett Nose, MD 11/14/21 1930

## 2021-11-15 ENCOUNTER — Encounter: Payer: Self-pay | Admitting: Urology

## 2021-11-15 ENCOUNTER — Other Ambulatory Visit: Payer: Self-pay

## 2021-11-15 ENCOUNTER — Ambulatory Visit (INDEPENDENT_AMBULATORY_CARE_PROVIDER_SITE_OTHER): Payer: Medicare Other | Admitting: Urology

## 2021-11-15 VITALS — BP 160/78 | HR 77 | Ht <= 58 in | Wt 107.0 lb

## 2021-11-15 DIAGNOSIS — R972 Elevated prostate specific antigen [PSA]: Secondary | ICD-10-CM

## 2021-11-15 MED ORDER — GENTAMICIN SULFATE 40 MG/ML IJ SOLN
80.0000 mg | Freq: Once | INTRAMUSCULAR | Status: AC
  Administered 2021-11-15: 80 mg via INTRAMUSCULAR

## 2021-11-15 MED ORDER — LEVOFLOXACIN 500 MG PO TABS
500.0000 mg | ORAL_TABLET | Freq: Once | ORAL | Status: AC
  Administered 2021-11-15: 500 mg via ORAL

## 2021-11-15 NOTE — Patient Instructions (Signed)

## 2021-11-15 NOTE — Progress Notes (Signed)
° °  11/15/21  Indication: Elevated PSA, 14.33  76 year old male referred for elevated PSA of 54 who also had urinary symptoms and gross hematuria, treated with antibiotics for suspected prostatitis with significant improvement in his urinary symptoms and incontinence, and PSA decreased to 26.8.  Repeat PSA 1 month later remained significantly elevated at 30.4, and he opted for prostate biopsy.  Continues to report urinary frequency.  Prostate Biopsy Procedure   Informed consent was obtained, and we discussed the risks of bleeding and infection/sepsis. A time out was performed to ensure correct patient identity.  Pre-Procedure: - Last PSA Level: 30.4 - Gentamicin and levaquin given for antibiotic prophylaxis - Transrectal Ultrasound performed revealing a 88 gm prostate, PSA density 0.34 - No significant hypoechoic or median lobe noted - No evidence of bladder lesions on ultrasound  Procedure: - Prostate block performed using 10 cc 1% lidocaine and biopsies taken from sextant areas, a total of 12 under ultrasound guidance.  Post-Procedure: - Patient tolerated the procedure well - He was counseled to seek immediate medical attention if experiences significant bleeding, fevers, or severe pain - Return in one week to discuss biopsy results  Assessment/ Plan: Will follow up in 1-2 weeks to discuss pathology If biopsy negative, consider HOLEP for urinary symptoms/BPH  Nickolas Madrid, MD 11/15/2021

## 2021-11-17 LAB — SURGICAL PATHOLOGY

## 2021-11-22 ENCOUNTER — Other Ambulatory Visit: Payer: Self-pay

## 2021-11-22 ENCOUNTER — Encounter: Payer: Self-pay | Admitting: Urology

## 2021-11-22 ENCOUNTER — Ambulatory Visit (INDEPENDENT_AMBULATORY_CARE_PROVIDER_SITE_OTHER): Payer: Medicare Other | Admitting: Urology

## 2021-11-22 VITALS — BP 180/77 | HR 74 | Ht <= 58 in | Wt 107.0 lb

## 2021-11-22 DIAGNOSIS — N401 Enlarged prostate with lower urinary tract symptoms: Secondary | ICD-10-CM | POA: Diagnosis not present

## 2021-11-22 DIAGNOSIS — N138 Other obstructive and reflux uropathy: Secondary | ICD-10-CM | POA: Diagnosis not present

## 2021-11-22 DIAGNOSIS — R972 Elevated prostate specific antigen [PSA]: Secondary | ICD-10-CM

## 2021-11-22 MED ORDER — FINASTERIDE 5 MG PO TABS: 5.0000 mg | ORAL_TABLET | Freq: Every day | ORAL | 3 refills | Status: DC

## 2021-11-22 NOTE — Progress Notes (Signed)
° °  11/22/2021 11:30 AM   Cody [name] 1946/01/13 [id number]  Reason for visit: Follow up elevated PSA, urinary symptoms  HPI: 76 year old male who originally presented with an elevated PSA of 54, as well as urinary symptoms with weak stream, urgency, incontinence, and suspected overflow incontinence.  PVR has been mildly elevated ranging from 175 to 200 mL.  He was treated for suspected prostatitis, and Flomax was added with significant improvement in his urinary symptoms.  PSA decreased to 26.8 from 54 after antibiotics, but on repeat a month later remained elevated at 30 and he opted for prostate biopsy.  Prostate biopsy 11/15/2021 showed an 88 g prostate, and all cores showed only benign prostate tissue.  Suspect BPH as the etiology of his urinary symptoms and significantly elevated PSA.  We discussed the 20% false-negative rate for prostate biopsy, the need for close monitoring of the PSA moving forward.  He currently is satisfied with his urinary symptoms, and they are improved significantly prior to being treated with a course of antibiotics and starting the Flomax.  We discussed options including continuing Flomax alone, addition of finasteride, or consideration of an outlet procedure like HOLEP.  The other benefit of HOLEP would be to obtain further prostate tissue to rule out prostate cancer.  Using shared decision making, he opted for the addition of finasteride, with follow-up in 6 months for PVR and repeat PSA  Sondra Come, MD  [facility] [address], [address] [place], [place] [address] [phone]

## 2021-11-22 NOTE — Patient Instructions (Signed)
T?ng s?n tuy?n ti?n li?t lnh tnh Benign Prostatic Hyperplasia T?ng s?n tuy?n ti?n li?t lnh tnh (BPH) l tuy?n ti?n li?t ph ??i [place] qu trnh lo ha bnh th??ng gy ra. Tuy?n ti?n li?t c th? l?n h?n khi ?n ng nhi?u tu?i h?n. Tnh tr?ng ny khng ph?i [place] ung th? gy ra. Tuy?n ti?n li?t l tuy?n c kch th??c b?ng qu? h? ?o, tham gia vo qu trnh s?n xu?t tinh d?ch. N n?m pha tr??c tr?c trng v bn d??i bng quang. Bng quang tr? n??c ti?u. Ni?u ??o ??a n??c ti?u ? ???c tr? ra kh?i c? th?. Tuy?n ti?n li?t ph ??i c th? chn p vo ni?u ??o. ?i?u ny c th? lm cho kh ?i ti?u h?n. Tch t? n??c ti?u trong bng quang c th? gy nhi?m trng. p l?c ng??c dng v nhi?m trng c th? ti?n tri?n thnh t?n th??ng bng quang v suy th?n. C nh?ng nguyn nhn g? Tnh tr?ng ny l m?t ph?n c?a qu trnh lo ha thng th??ng. Tuy nhin, khng ph?i t?t c? nam gi?i ??u b? cc v?n ?? c?a tnh tr?ng ny. N?u tuy?n ti?n li?t ph ??i ra xa ni?u ??o, dng n??c ti?u s? khng b? ch?n. N?u n ph ??i h??ng v? ni?u ??o v chn p ni?u ??o, s? c v?n ?? v? ti?u ti?n. ?i?u g lm t?ng nguy c?? Tnh tr?ng ny d? x?y ra h?n ? nam gi?i trn 50 tu?i. C cc d?u hi?u ho?c tri?u ch?ng g? Nh?ng tri?u ch?ng c?a tnh tr?ng ny bao g?m: Th?c d?y th??ng xuyn trong ?m ?? ?i ti?u. C?n ?i ti?u th??ng xuyn trong ngy. Kh b?t ??u dng n??c ti?u. Gi?m kch th??c v ?? m?nh c?a dng n??c ti?u. R? (nh? gi?t) sau khi ti?u ti?n. Khng th? ?i ti?u. Tnh tr?ng ny c?n ph?i ?i?u tr? ngay l?p t?c. Khng th? ?i h?t n??c ti?u trong bng quang. ?au khi ?i ti?u. Tnh tr?ng ny ph? bi?n h?n n?u c?ng km theo nhi?m trng. Nhi?m trng ???ng ti?u (UTI). Ch?n ?on tnh tr?ng ny nh? th? no? Tnh tr?ng ny ???c ch?n ?on d?a vo khm th?c th?, khai thc b?nh s? v cc tri?u ch?ng c?a qu v?. Cc ki?m tra c?ng s? ???c th?c hi?n, ch?ng h?n nh?: Ch?p bng quang sau khi ?i ti?u. Bi?n php ny ?o b?t c? l??ng n??c ti?u no cn l?i trong  bng quang sau khi qu v? ?i ti?u xong. Khm tr?c trng b?ng ngn tay. Trong l?n ki?m tra tr?c trng, chuyn gia ch?m [place] s?c kh?e s? ki?m tra tuy?n ti?n li?t c?a qu v? b?ng cch cho m?t ngn tay ?eo g?ng, ? bi tr?n vo tr?c trng ?? s? m?t sau c?a tuy?n ti?n li?t. Ki?m tra ny pht hi?n kch th??c c?a tuy?n v b?t c? kh?i u ho?c s? pht tri?n b?t th??ng no. Xt nghi?m n??c ti?u (phn tch n??c ti?u). Sng l?c khng nguyn ??c hi?u tuy?n ti?n li?t (PSA). ?y l m?t xt nghi?m mu ???c s? d?ng ?? sng l?c ung th? tuy?n ti?n li?t. Siu m. Ki?m tra ny s? d?ng sng m thanh ?? t?o hnh ?nh tuy?n ti?n li?t b?ng ph??ng php ?i?n t?. Chuyn gia ch?m [name] s?c kh?e c th? gi?i thi?u qu v? ??n m?t chuyn gia v? b?nh th?n v b?nh tuy?n ti?n li?t (bc s? chuyn khoa ti?t ni?u). Tnh tr?ng ny ???c ?i?u tr? nh? th? no? Khi b?t ??u c tri?u ch?ng, chuyn gia ch?m [place] s?c kh?e s? theo  di tnh tr?ng c?a qu v? (theo di tch c?c ho?c ch? tch c?c). Vi?c ?i?u tr? ti?nh tra?ng ny ty thu?c vo m??c ?? n?ng c?a b?nh. ?i?u tr? c th? bao g?m: Theo di v khm th??ng nin. ?y c th? l cch ?i?u tr? duy nh?t c?n thi?t n?u tnh tr?ng v cc tri?u ch?ng c?a qu v? nh?. Thu?c ?? gia?m nhe? ca?c tri?u ch??ng, bao g?m: Thu?c lm thu nh? tuy?n ti?n li?t. Thu?c ?? th? gin cc c? c?a tuy?n ti?n li?t. Ph?u thu?t trong tr??ng h?p n?ng. Ph?u thu?t c th? bao g?m: C?t b? tuy?n ti?n li?t. Trong th? thu?t ny, m tuy?n ti?n li?t ???c ???c lo?i b? hon ton qua m?t v?t m? m? ho?c qua n?i soi ? b?ng ho?c robot. C?t tuy?n ti?n li?t qua ni?u ??o (TURP). Trong th? thu?t ny, m?t d?ng c? ???c lu?n qua l? trn ??u d??ng v?t (ni?u ??o). N ???c s? d?ng ?? c?t b? m li bn trong c?a tuy?n ti?n li?t. Cc m?nh ny ???c lo?i b? thng qua cng m?t l? ? ??u d??ng v?t. Ph??ng php ny gip lo?i b? t?c ngh?n. R?ch qua ni?u ??o (TUIP). Trong th? thu?t ny, cc v?t r?ch nh? ???c t?o ra trong tuy?n ti?n li?t. Th? thu?t ny lm gi?m p  l?c c?a tuy?n ti?n li?t ln ni?u ??o. Li?u php nhi?t vi sng ni?u ??o (TUMT). Th? thu?t ny s? d?ng vi sng ?? t?o ra nhi?t. Nhi?t s? ph h?y v lo?i b? m?t l??ng nh? m tuy?n ti?n li?t. C?t b? tuy?n ti?n li?t b?ng kim qua ni?u ??o (TUNA). Th? thu?t ny s? d?ng t?n s? radio ?? ph h?y v lo?i b? m?t l??ng nh? m tuy?n ti?n li?t. Laser n?i tuy?n (ILC). Th? thu?t ny s? d?ng tia laser ?? ph h?y v lo?i b? m?t l??ng nh? m tuy?n ti?n li?t. ?i?n-b?c h?i tuy?n ti?n li?t qua ni?u ??o (TUVP). Th? thu?t ny s? d?ng cc ?i?n c?c ?? ph h?y v lo?i b? m?t l??ng nh? m tuy?n ti?n li?t. Nng ni?u ??o tuy?n ti?n li?t. Th? thu?t ny ??a m?t m c?y vo ?? ??y cc thy c?a tuy?n ti?n li?t ra xa ni?u ??o. Tun th? nh?ng h??ng d?n ny ? nh: Ch? s? d?ng thu?c khng k ??n v thu?c k ??n theo ch? d?n c?a chuyn gia ch?m [place] s?c kh?e. Theo di cc tri?u ch?ng xem c b?t c? thay ??i no khng. Hy trao ??i v?i chuyn gia ch?m [place] s?c kh?e n?u c b?t k? thay ??i no. Trnh u?ng nhi?u n??c tr??c khi ?i ng? ho?c ra ngoi n?i cng c?ng. Trnh u?ng ho?c gi?m l??ng caffeine ho?c gi?m l??ng r??u qu v? u?ng. T? cho qu v? th?i gian khi ti?u ti?n. Tun th? theo t?t c? cc l?n khm l?i. ?i?u ny c vai tr quan tr?ng. Hy lin l?c v?i chuyn gia ch?m [place] s?c kh?e n?u: Qu v? b? ?au l?ng khng r nguyn nhn. Cc tri?u ch?ng c?a qu v? khng ?? h?n sau khi ???c ?i?u tr?[place]. Qu v? b? nh?ng tc d?ng ph? c?a thu?c m qu v? ?ang dng. N??c ti?u c?a qu v? tr? nn r?t s?m mu ho?c c mi hi. Vng b?ng d??i c?a qu v? ch??ng ln v qu v? ?i ti?u kh kh?n. Yu c?u tr? gip ngay l?p t?c n?u: Qu v? b? s?t ho?c ?n l?nh. Qu v? ??t nhin khng th? ti?u ti?n. Qu v? c?m th?y chong vng ho?c r?t chng m?t, ho?c ng?t x?u. C  l??ng l?n mu ho?c c?c mu ?ng trong n??c ti?u c?a qu v?. V?n ?? ti?u ti?n tr? ln kh ki?m sot. Qu v? b? ?au th?t l?ng ho?c ?au m?ng s??n t? m?c ?? trung bnh ??n m?c ?? n?ng. M?ng s??n l phn bn c?  th? gi?a x??ng s??n v hng. Nh?ng tri?u ch?ng ny c th? l tr??ng h?p c?p c?u. Yu c?u tr? gip ngay l?p t?c. Hy g?i 911. Khng ch? xem tri?u ch?ng c h?t khng. Khng t? li xe ??n b?nh vi?n. Tm t?t T?ng s?n tuy?n ti?n li?t lnh tnh (BPH) l tuy?n ti?n li?t ph ??i Daisy qu trnh lo ha bnh th??ng gy ra. Tnh tr?ng ny khng ph?i [place]ontra ung th? gy ra. Tuy?n ti?n li?t ph ??i c th? chn p vo ni?u ??o. ?i?u ny c th? lm cho kh ?i ti?u. Tnh tr?ng ny d? x?y ra h?n ? nam gi?i trn 50 tu?i. Yu c?u tr? gip ngay l?p t?c n?u qu v? ??t nhin khng th? ti?u ti?n. Thng tin ny khng nh?m m?c ?ch thay th? cho l?i khuyn m chuyn gia ch?m [place] s?c kh?e ni v?i qu v?. Hy b?o ??m qu v? ph?i th?o lu?n b?t k? v?n ?? g m qu v? c v?i chuyn gia ch?m [place] s?c kh?e c?a qu v?. Document Revised: 05/31/2021 Document Reviewed: 05/31/2021 Elsevier Patient Education  2022 [employer].

## 2022-03-06 DIAGNOSIS — M1A9XX1 Chronic gout, unspecified, with tophus (tophi): Secondary | ICD-10-CM | POA: Insufficient documentation

## 2022-05-10 ENCOUNTER — Other Ambulatory Visit: Payer: Medicare Other

## 2022-05-16 ENCOUNTER — Ambulatory Visit: Payer: Medicare Other | Admitting: Urology

## 2022-05-17 ENCOUNTER — Encounter: Payer: Self-pay | Admitting: Urology

## 2022-07-13 ENCOUNTER — Other Ambulatory Visit: Payer: Medicare Other

## 2022-07-13 DIAGNOSIS — R972 Elevated prostate specific antigen [PSA]: Secondary | ICD-10-CM

## 2022-07-14 LAB — PSA: Prostate Specific Ag, Serum: 35.7 ng/mL — ABNORMAL HIGH (ref 0.0–4.0)

## 2022-07-17 ENCOUNTER — Encounter: Payer: Self-pay | Admitting: Urology

## 2022-07-17 ENCOUNTER — Ambulatory Visit (INDEPENDENT_AMBULATORY_CARE_PROVIDER_SITE_OTHER): Payer: Medicare Other | Admitting: Urology

## 2022-07-17 VITALS — BP 133/66 | HR 90 | Ht <= 58 in | Wt 101.0 lb

## 2022-07-17 DIAGNOSIS — R972 Elevated prostate specific antigen [PSA]: Secondary | ICD-10-CM

## 2022-07-17 DIAGNOSIS — N401 Enlarged prostate with lower urinary tract symptoms: Secondary | ICD-10-CM

## 2022-07-17 DIAGNOSIS — N138 Other obstructive and reflux uropathy: Secondary | ICD-10-CM

## 2022-07-17 LAB — BLADDER SCAN AMB NON-IMAGING

## 2022-07-17 MED ORDER — FINASTERIDE 5 MG PO TABS: 5.0000 mg | ORAL_TABLET | Freq: Every day | ORAL | 3 refills | Status: DC

## 2022-07-17 MED ORDER — TAMSULOSIN HCL 0.4 MG PO CAPS: 0.4000 mg | ORAL_CAPSULE | Freq: Every day | ORAL | 3 refills | Status: DC

## 2022-07-17 NOTE — Patient Instructions (Signed)
Continue Flomax and finasteride.  These both work on the prostate to relax and shrink the prostate and improve urination, but both need to be taken daily long-term.  If you stop the medications the urinary symptoms will worsen again.  T?ng s?n tuy?n ti?n li?t lnh tnh Benign Prostatic Hyperplasia  T?ng s?n tuy?n ti?n li?t lnh tnh (BPH) l tuy?n ti?n li?t ph ??i Jabe qu trnh lo ha bnh th??ng gy ra. Tuy?n ti?n li?t c th? l?n h?n khi ?n ng nhi?u tu?i h?n. Tnh tr?ng ny khng ph?i Enrique ung th? gy ra. Tuy?n ti?n li?t l tuy?n c kch th??c b?ng qu? h? ?o, tham gia vo qu trnh s?n xu?t tinh d?ch. N n?m pha tr??c tr?c trng v bn d??i bng quang. Bng quang tr? n??c ti?u. Ni?u ??o ??a n??c ti?u ? ???c tr? ra kh?i c? th?. Tuy?n ti?n li?t ph ??i c th? chn p vo ni?u ??o. ?i?u ny c th? lm cho kh ?i ti?u h?n. Tch t? n??c ti?u trong bng quang c th? gy nhi?m trng. p l?c ng??c dng v nhi?m trng c th? ti?n tri?n thnh t?n th??ng bng quang v suy th?n. C nh?ng nguyn nhn g? Tnh tr?ng ny l m?t ph?n c?a qu trnh lo ha thng th??ng. Tuy nhin, khng ph?i t?t c? nam gi?i ??u b? cc v?n ?? c?a tnh tr?ng ny. N?u tuy?n ti?n li?t ph ??i ra xa ni?u ??o, dng n??c ti?u s? khng b? ch?n. N?u n ph ??i h??ng v? ni?u ??o v chn p ni?u ??o, s? c v?n ?? v? ti?u ti?n. ?i?u g lm t?ng nguy c?? Tnh tr?ng ny d? x?y ra h?n ? nam gi?i trn 50 tu?i. C cc d?u hi?u ho?c tri?u ch?ng g? Nh?ng tri?u ch?ng c?a tnh tr?ng ny bao g?m: Th?c d?y th??ng xuyn trong ?m ?? ?i ti?u. C?n ?i ti?u th??ng xuyn trong ngy. Kh b?t ??u dng n??c ti?u. Gi?m kch th??c v ?? m?nh c?a dng n??c ti?u. R? (nh? gi?t) sau khi ti?u ti?n. Khng th? ?i ti?u. Tnh tr?ng ny c?n ph?i ?i?u tr? ngay l?p t?c. Khng th? ?i h?t n??c ti?u trong bng quang. ?au khi ?i ti?u. Tnh tr?ng ny ph? bi?n h?n n?u c?ng km theo nhi?m trng. Nhi?m trng ???ng ti?u (UTI). Ch?n ?on tnh tr?ng ny nh? th? no? Tnh tr?ng  ny ???c ch?n ?on d?a vo khm th?c th?, khai thc b?nh s? v cc tri?u ch?ng c?a qu v?. Cc ki?m tra c?ng s? ???c th?c hi?n, ch?ng h?n nh?: Ch?p bng quang sau khi ?i ti?u. Bi?n php ny ?o b?t c? l??ng n??c ti?u no cn l?i trong bng quang sau khi qu v? ?i ti?u xong. Khm tr?c trng b?ng ngn tay. Trong l?n ki?m tra tr?c trng, chuyn gia ch?m [place] s?c kh?e s? ki?m tra tuy?n ti?n li?t c?a qu v? b?ng cch cho m?t ngn tay ?eo g?ng, ? bi tr?n vo tr?c trng ?? s? m?t sau c?a tuy?n ti?n li?t. Ki?m tra ny pht hi?n kch th??c c?a tuy?n v b?t c? kh?i u ho?c s? pht tri?n b?t th??ng no. Xt nghi?m n??c ti?u (phn tch n??c ti?u). Sng l?c khng nguyn ??c hi?u tuy?n ti?n li?t (PSA). ?y l m?t xt nghi?m mu ???c s? d?ng ?? sng l?c ung th? tuy?n ti?n li?t. Siu m. Ki?m tra ny s? d?ng sng m thanh ?? t?o hnh ?nh tuy?n ti?n li?t b?ng ph??ng php ?i?n t?. Chuyn gia ch?m [place] s?c kh?e c th? gi?i thi?u qu  v? ??n m?t chuyn gia v? b?nh th?n v b?nh tuy?n ti?n li?t (bc s? chuyn khoa ti?t ni?u). Tnh tr?ng ny ???c ?i?u tr? nh? th? no? Khi b?t ??u c tri?u ch?ng, chuyn gia ch?m [place] s?c kh?e s? theo di tnh tr?ng c?a qu v? (theo di tch c?c ho?c ch? tch c?c). Vi?c ?i?u tr? ti?nh tra?ng ny ty thu?c vo m??c ?? n?ng c?a b?nh. ?i?u tr? c th? bao g?m: Theo di v khm th??ng nin. ?y c th? l cch ?i?u tr? duy nh?t c?n thi?t n?u tnh tr?ng v cc tri?u ch?ng c?a qu v? nh?. Thu?c ?? gia?m nhe? ca?c tri?u ch??ng, bao g?m: Thu?c lm thu nh? tuy?n ti?n li?t. Thu?c ?? th? gin cc c? c?a tuy?n ti?n li?t. Ph?u thu?t trong tr??ng h?p n?ng. Ph?u thu?t c th? bao g?m: C?t b? tuy?n ti?n li?t. Trong th? thu?t ny, m tuy?n ti?n li?t ???c ???c lo?i b? hon ton qua m?t v?t m? m? ho?c qua n?i soi ? b?ng ho?c robot. C?t tuy?n ti?n li?t qua ni?u ??o (TURP). Trong th? thu?t ny, m?t d?ng c? ???c lu?n qua l? trn ??u d??ng v?t (ni?u ??o). N ???c s? d?ng ?? c?t b? m li bn trong c?a tuy?n ti?n  li?t. Cc m?nh ny ???c lo?i b? thng qua cng m?t l? ? ??u d??ng v?t. Ph??ng php ny gip lo?i b? t?c ngh?n. R?ch qua ni?u ??o (TUIP). Trong th? thu?t ny, cc v?t r?ch nh? ???c t?o ra trong tuy?n ti?n li?t. Th? thu?t ny lm gi?m p l?c c?a tuy?n ti?n li?t ln ni?u ??o. Li?u php nhi?t vi sng ni?u ??o (TUMT). Th? thu?t ny s? d?ng vi sng ?? t?o ra nhi?t. Nhi?t s? ph h?y v lo?i b? m?t l??ng nh? m tuy?n ti?n li?t. C?t b? tuy?n ti?n li?t b?ng kim qua ni?u ??o (TUNA). Th? thu?t ny s? d?ng t?n s? radio ?? ph h?y v lo?i b? m?t l??ng nh? m tuy?n ti?n li?t. Laser n?i tuy?n (ILC). Th? thu?t ny s? d?ng tia laser ?? ph h?y v lo?i b? m?t l??ng nh? m tuy?n ti?n li?t. ?i?n-b?c h?i tuy?n ti?n li?t qua ni?u ??o (TUVP). Th? thu?t ny s? d?ng cc ?i?n c?c ?? ph h?y v lo?i b? m?t l??ng nh? m tuy?n ti?n li?t. Nng ni?u ??o tuy?n ti?n li?t. Th? thu?t ny ??a m?t m c?y vo ?? ??y cc thy c?a tuy?n ti?n li?t ra xa ni?u ??o. Tun th? nh?ng h??ng d?n ny ? nh: Ch? s? d?ng thu?c khng k ??n v thu?c k ??n theo ch? d?n c?a chuyn gia ch?m [place] s?c kh?e. Theo di cc tri?u ch?ng xem c b?t c? thay ??i no khng. Hy trao ??i v?i chuyn gia ch?m [place] s?c kh?e n?u c b?t k? thay ??i no. Trnh u?ng nhi?u n??c tr??c khi ?i ng? ho?c ra ngoi n?i cng c?ng. Trnh u?ng ho?c gi?m l??ng caffeine ho?c gi?m l??ng r??u qu v? u?ng. T? cho qu v? th?i gian khi ti?u ti?n. Tun th? theo t?t c? cc l?n khm l?i. ?i?u ny c vai tr quan tr?ng. Hy lin l?c v?i chuyn gia ch?m [place] s?c kh?e n?u: Qu v? b? ?au l?ng khng r nguyn nhn. Cc tri?u ch?ng c?a qu v? khng ?? h?n sau khi ???c ?i?u tr?Ladell Heads v? b? nh?ng tc d?ng ph? c?a thu?c m qu v? ?ang dng. N??c ti?u c?a qu v? tr? nn r?t s?m mu ho?c c mi hi. Vng b?ng d??i c?a qu v? ch??ng ln v qu  v? ?i ti?u kh kh?n. Yu c?u tr? gip ngay l?p t?c n?u: Qu v? b? s?t ho?c ?n l?nh. Qu v? ??t nhin khng th? ti?u ti?n. Qu v? c?m th?y chong vng ho?c r?t  chng m?t, ho?c ng?t x?u. C l??ng l?n mu ho?c c?c mu ?ng trong n??c ti?u c?a qu v?. V?n ?? ti?u ti?n tr? ln kh ki?m sot. Qu v? b? ?au th?t l?ng ho?c ?au m?ng s??n t? m?c ?? trung bnh ??n m?c ?? n?ng. M?ng s??n l phn bn c? th? gi?a x??ng s??n v hng. Nh?ng tri?u ch?ng ny c th? l tr??ng h?p c?p c?u. Yu c?u tr? gip ngay l?p t?c. Hy g?i 911. Khng ch? xem tri?u ch?ng c h?t khng. Khng t? li xe ??n b?nh vi?n. Tm t?t T?ng s?n tuy?n ti?n li?t lnh tnh (BPH) l tuy?n ti?n li?t ph ??i Tomie qu trnh lo ha bnh th??ng gy ra. Tnh tr?ng ny khng ph?i Jacquel ung th? gy ra. Tuy?n ti?n li?t ph ??i c th? chn p vo ni?u ??o. ?i?u ny c th? lm cho kh ?i ti?u. Tnh tr?ng ny d? x?y ra h?n ? nam gi?i trn 50 tu?i. Yu c?u tr? gip ngay l?p t?c n?u qu v? ??t nhin khng th? ti?u ti?n. Thng tin ny khng nh?m m?c ?ch thay th? cho l?i khuyn m chuyn gia ch?m [place] s?c kh?e ni v?i qu v?. Hy b?o ??m qu v? ph?i th?o lu?n b?t k? v?n ?? g m qu v? c v?i chuyn gia ch?m [place] s?c kh?e c?a qu v?. Document Revised: 05/31/2021 Document Reviewed: 05/31/2021 Elsevier Patient Education  [place].

## 2022-07-17 NOTE — Progress Notes (Signed)
   07/17/2022 2:13 PM   Cody [name] 02-Apr-1946 [id number]  Reason for visit: Follow up elevated PSA, BPH, urinary symptoms  HPI: 76 year old male who originally presented in November 2022 with an elevated PSA of 54, as well as urinary symptoms with weak stream, urgency, incontinence, and suspected overflow incontinence.  PVR has been mildly elevated ranging from 175 to 200 mL.  He was treated for suspected prostatitis, and Flomax was added with significant improvement in his urinary symptoms.  PSA decreased to 26.8 from 54 after antibiotics, but on repeat a month later remained elevated at 30 and he opted for prostate biopsy.  Prostate biopsy 11/15/2021 showed an 88 g prostate, and all cores showed only benign prostate tissue.  Suspect BPH as the etiology of his urinary symptoms and significantly elevated PSA.  We discussed the 20% false-negative rate for prostate biopsy, the need for close monitoring of the PSA moving forward.  At our last visit in January 2023 he opted for the addition of finasteride with close follow-up for serial PSA monitoring and PVR.    His daughter provides most of the history today.  It sounds like he ran out of Flomax and urinary symptoms have worsened, and does not sound like he ever took the finasteride regularly.  PSA essentially stable at 35.7 from 30.4 in December 2022, would not anticipate a decrease since he was not taking the finasteride regularly.  We discussed possibility of missing prostate cancer and options including a prostate MRI, repeat prostate biopsy, or PSMA PET scan.  With his comorbidities and PSA decrease since treatment with antibiotics for acute UTI when PSA was 54, negative prostate biopsy, they opt to continue Flomax and finasteride, and risks and benefits discussed extensively.  He also has an alcoholic, and I counseled him to cut back on alcohol intake as this can certainly exacerbate urinary symptoms and nocturia.  PVR today normal at  13ml.  Flomax and finasteride refilled RTC 9 months PSA, PVR  Billey Co, MD  [facility] [address], [place],  [address] [phone]

## 2022-07-23 ENCOUNTER — Other Ambulatory Visit: Payer: Medicare Other

## 2022-08-02 ENCOUNTER — Ambulatory Visit: Payer: Medicare Other | Admitting: Urology

## 2022-08-18 IMAGING — CR DG ANKLE COMPLETE 3+V*L*
1 series · 3 of 3 positions shown · non-contrast
Comparison: No prior.

CLINICAL DATA: Left foot and ankle pain and swelling.  No injury.

EXAM:
LEFT ANKLE COMPLETE - 3+ VIEW

[Series 1: dg ankle complete left · 0.14mm/px · 3 of 3 slices shown]
[im 1/3]
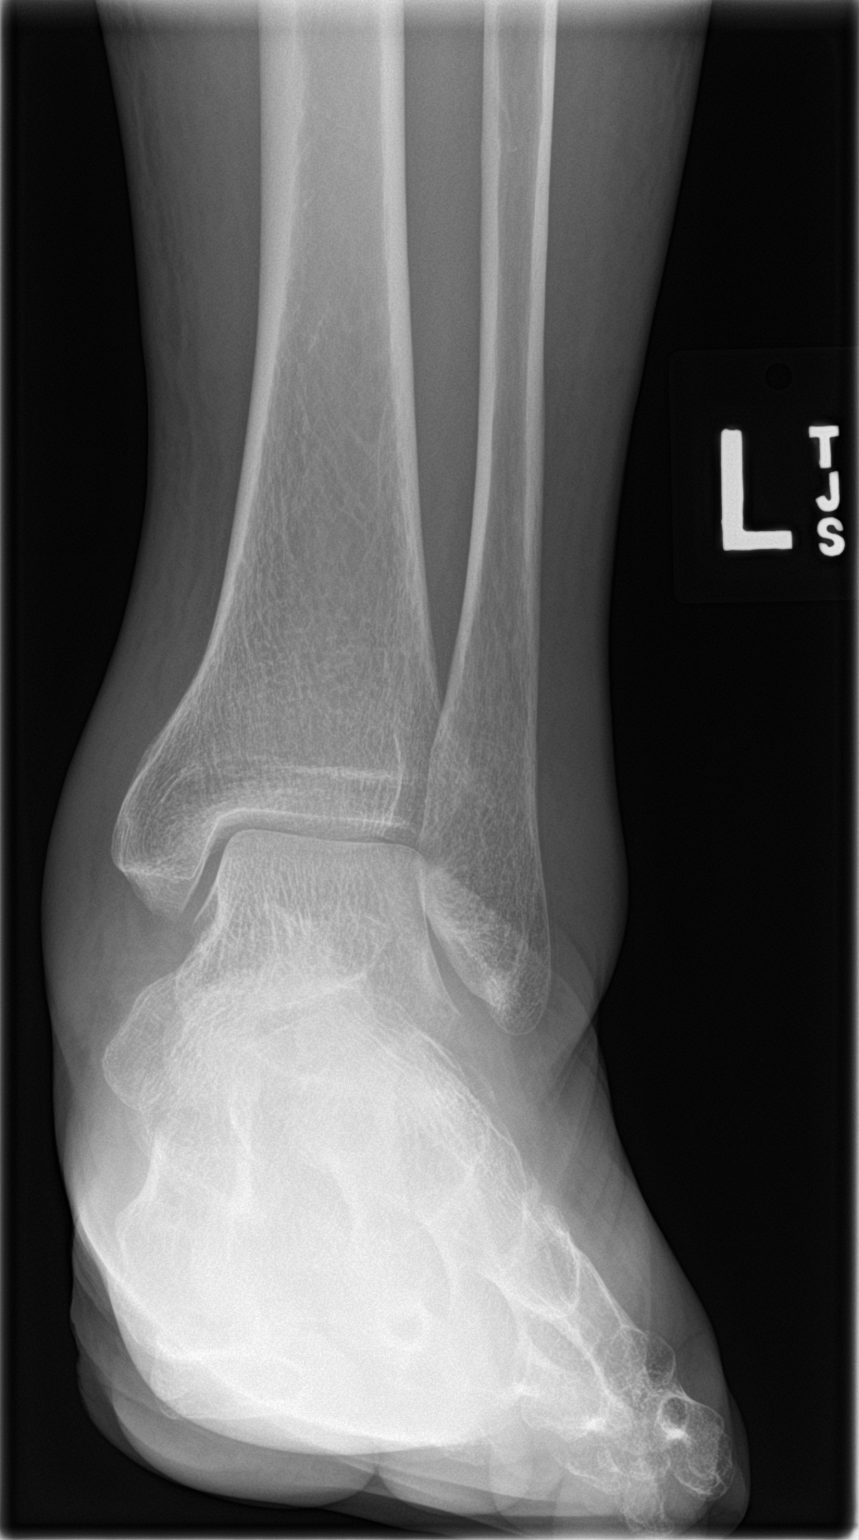
[im 2/3]
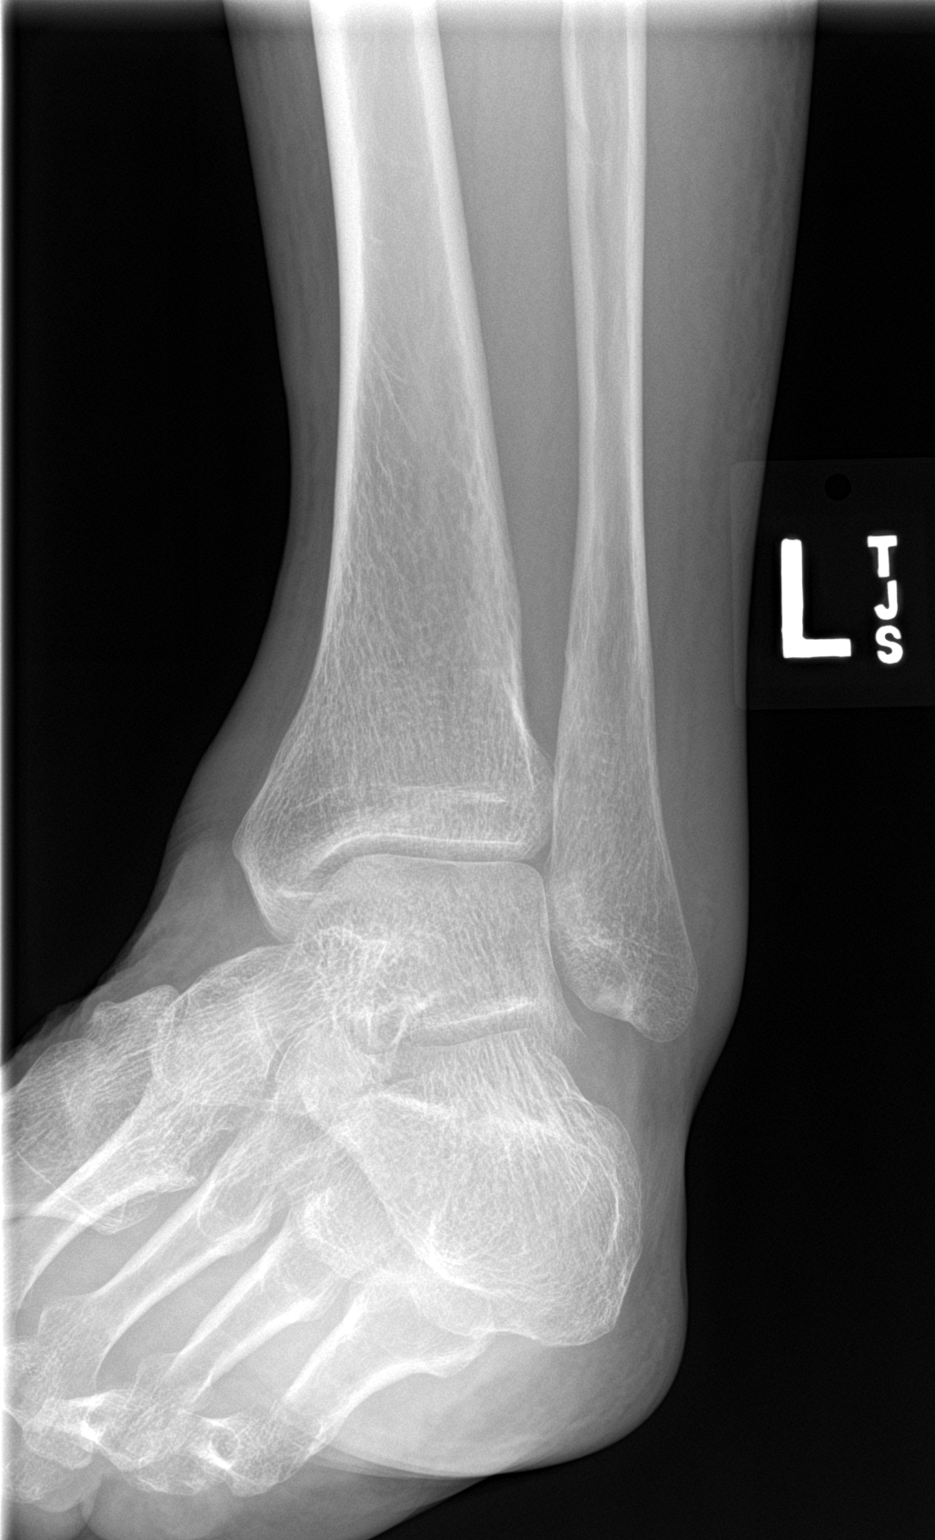
[im 3/3]
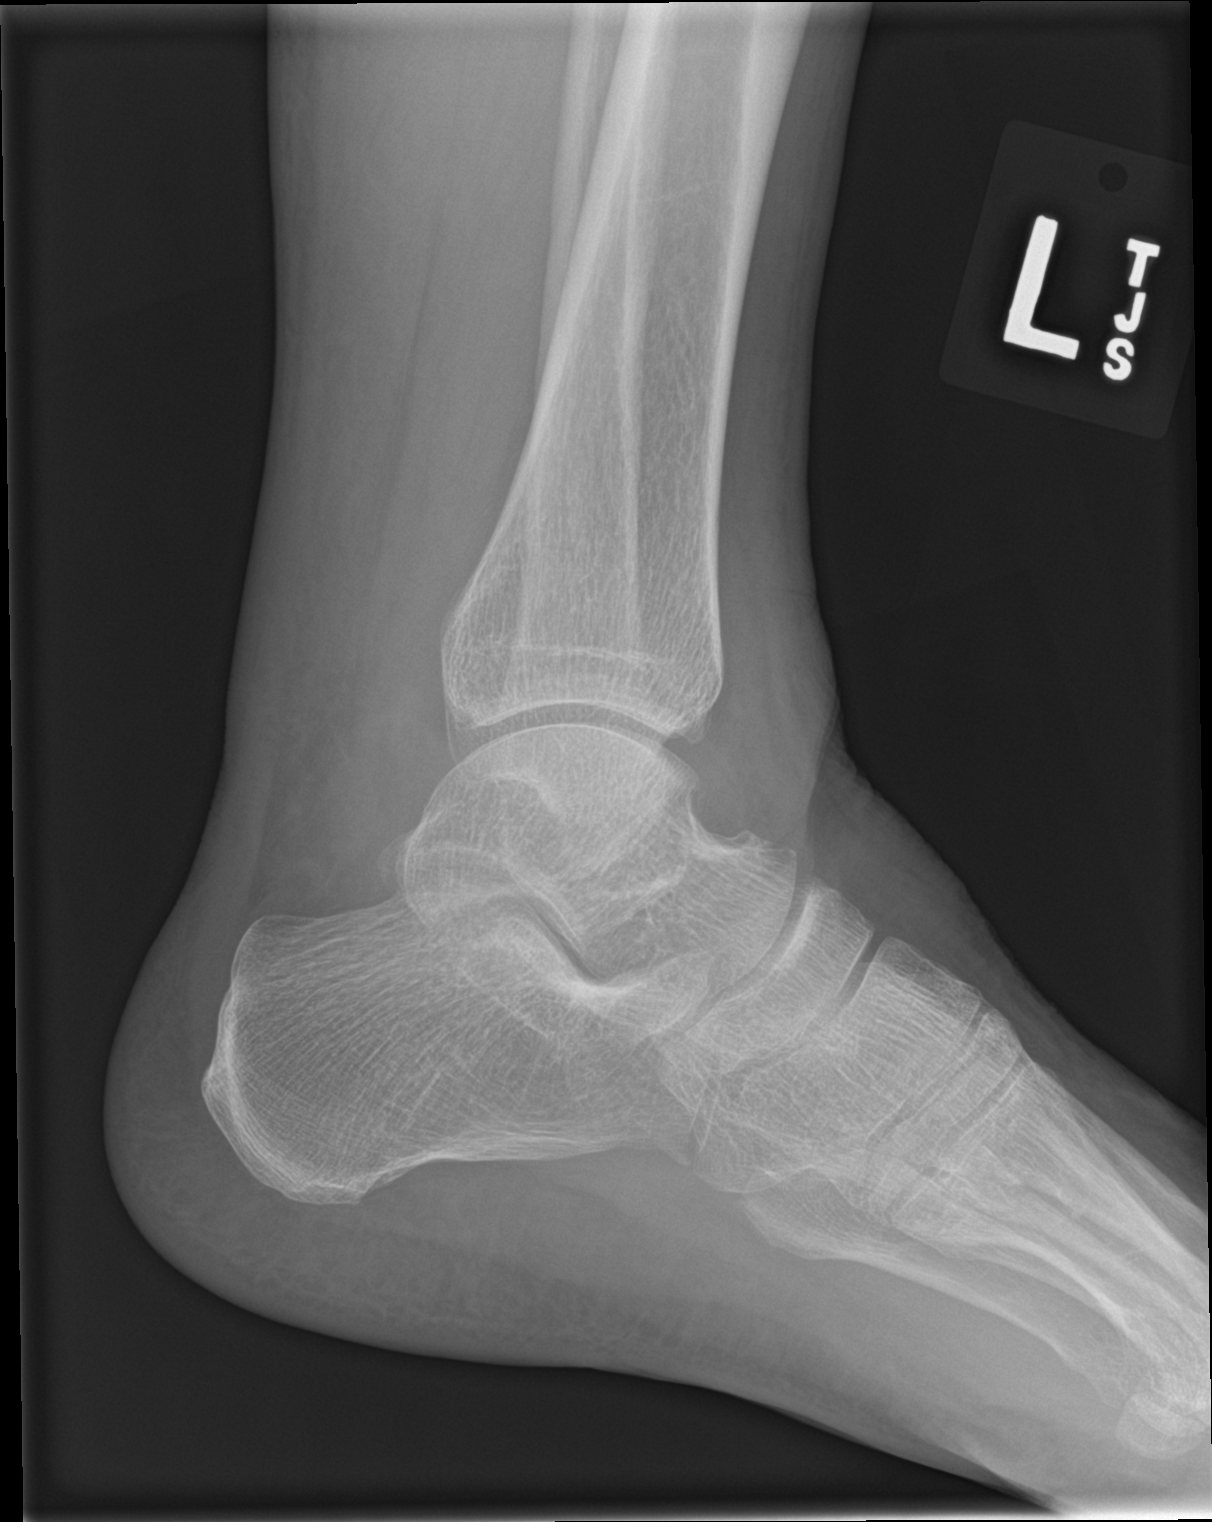

[3 of 3 positions shown; findings below may reference images not displayed]

FINDINGS: Diffuse prominent soft tissue swelling. No radiopaque foreign body
noted. No acute bony abnormality identified. No evidence of fracture
or dislocation. No evidence of erosive arthropathy.
IMPRESSION: 1.  Diffuse prominent soft tissue swelling.

2. No acute bony or joint abnormality identified. No evidence of
erosive arthropathy.

## 2022-09-07 IMAGING — CR DG HAND COMPLETE 3+V*R*
1 series · 3 of 3 positions shown · non-contrast
Comparison: X-ray right hand 10/17/2021

CLINICAL DATA: concern for dvt. Pt [REDACTED] with reports of R
thumb edema and pain X1 week.

EXAM:
RIGHT HAND - COMPLETE 3+ VIEW

[Series 1: dg hand complete right · 0.14mm/px · 3 of 3 slices shown]
[im 1/3]
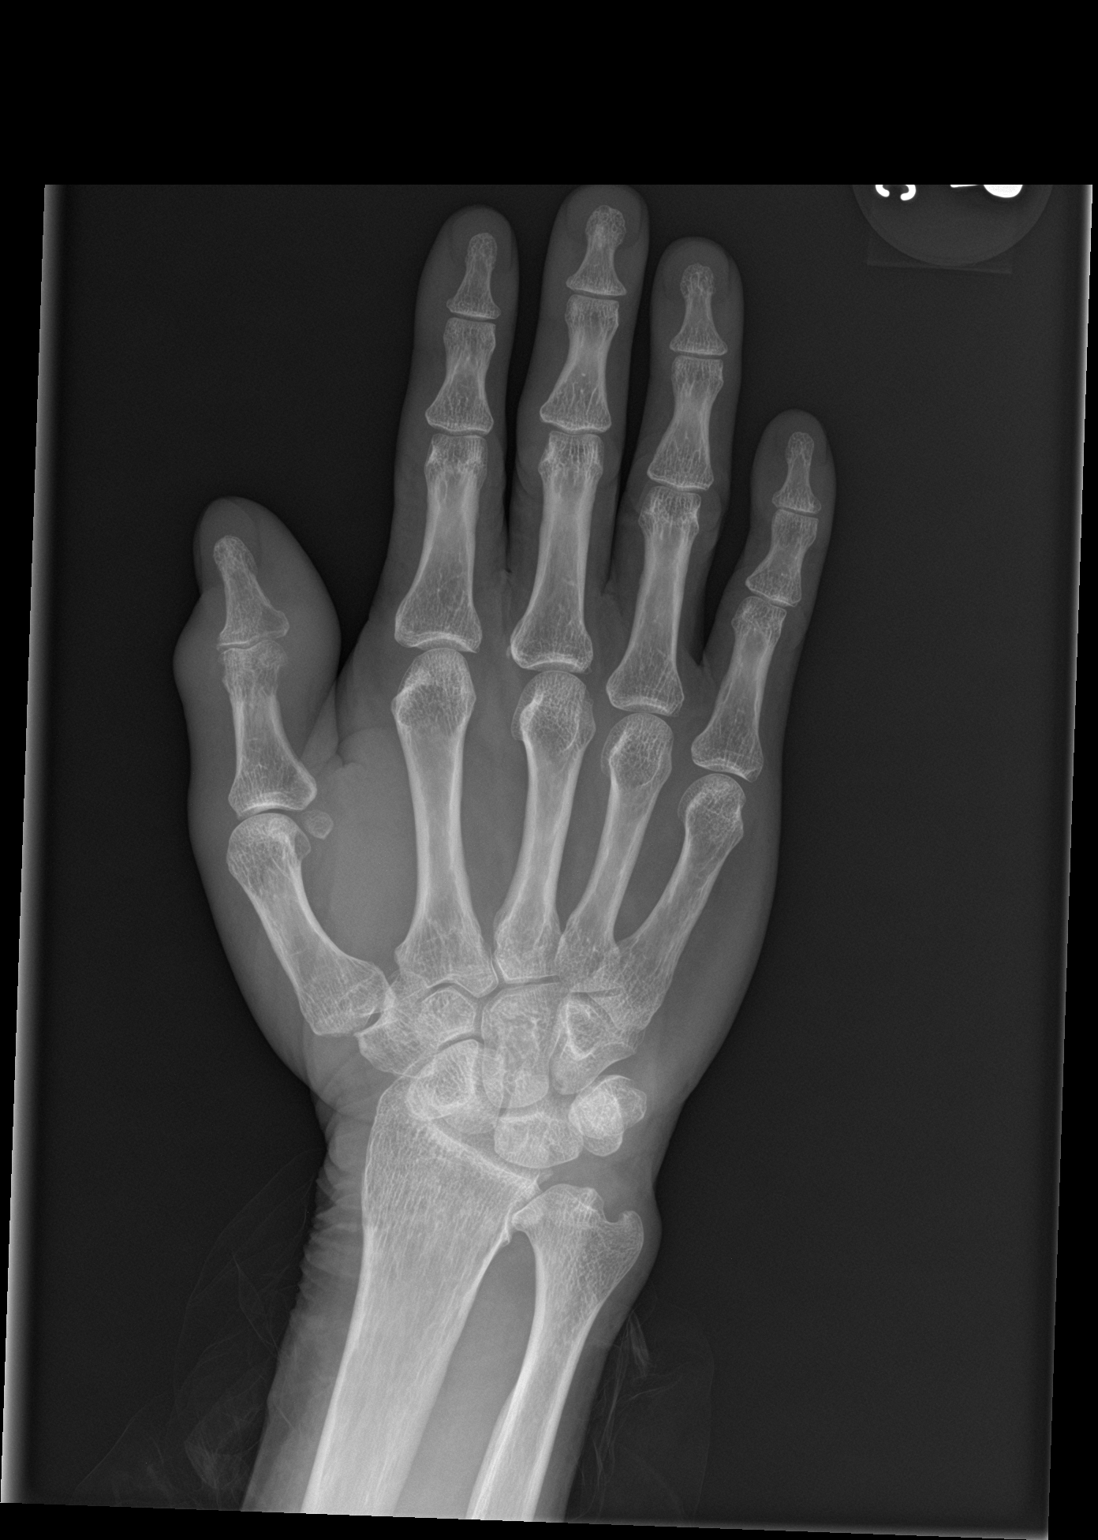
[im 2/3]
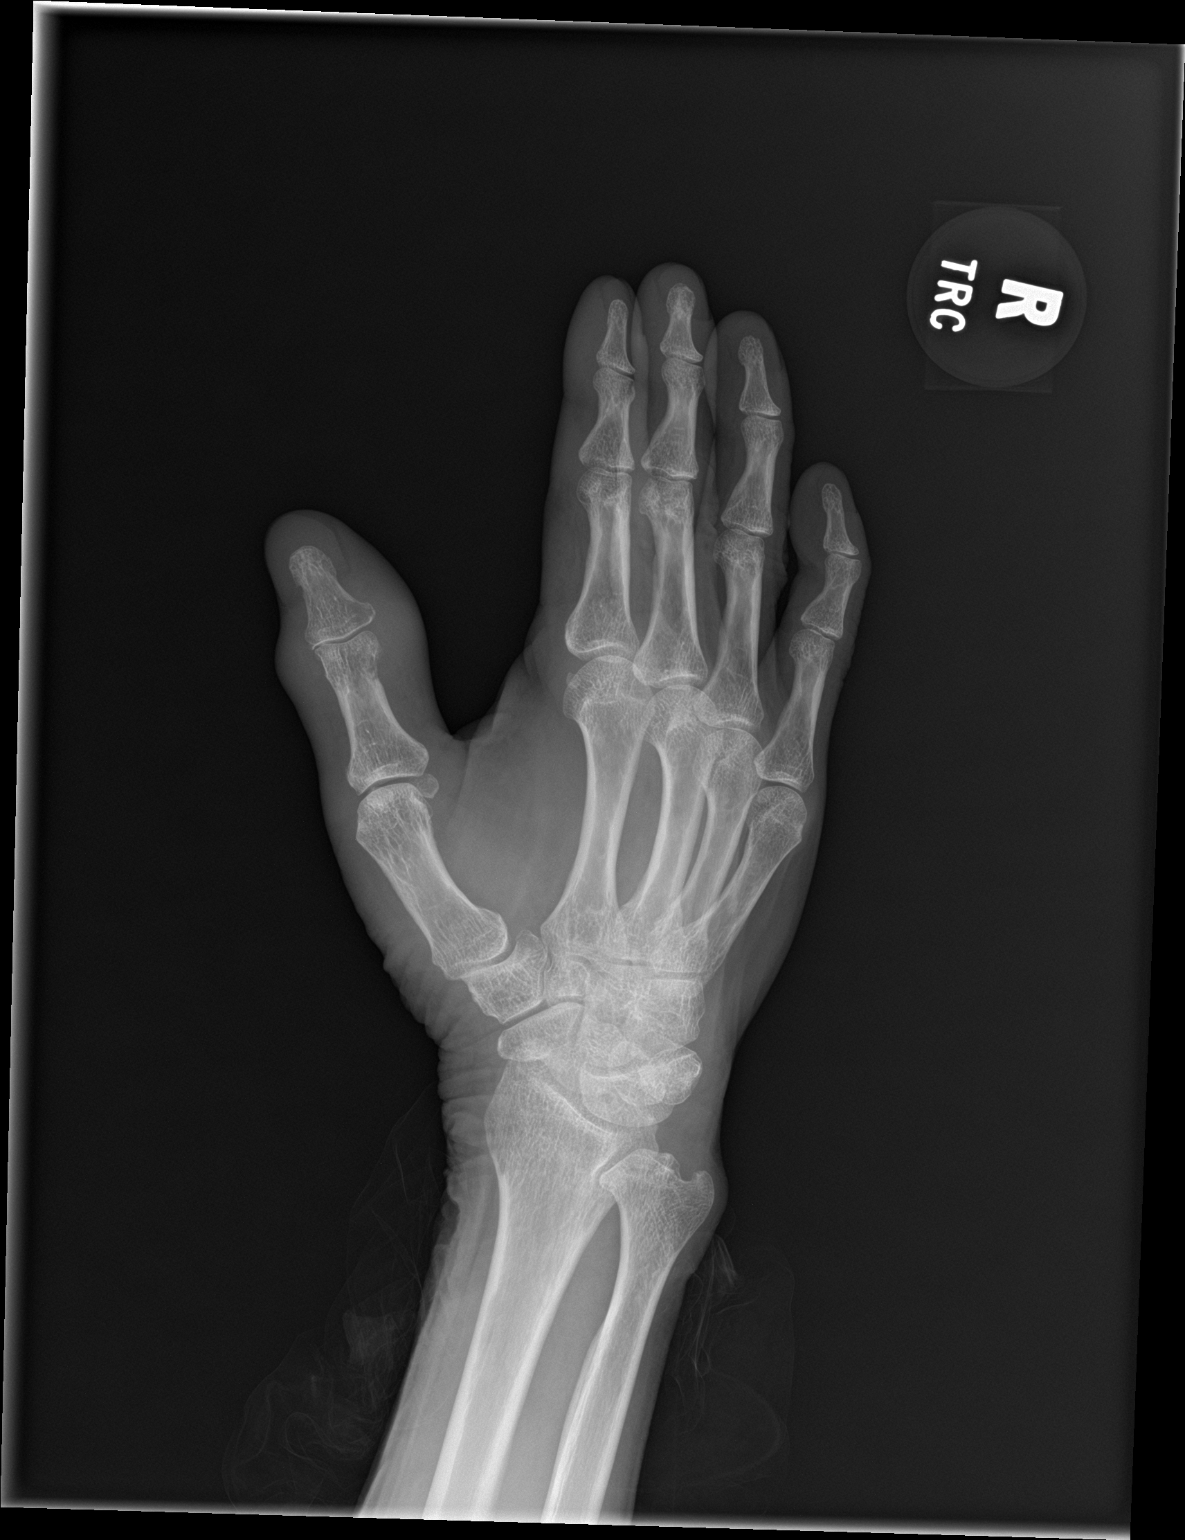
[im 3/3]
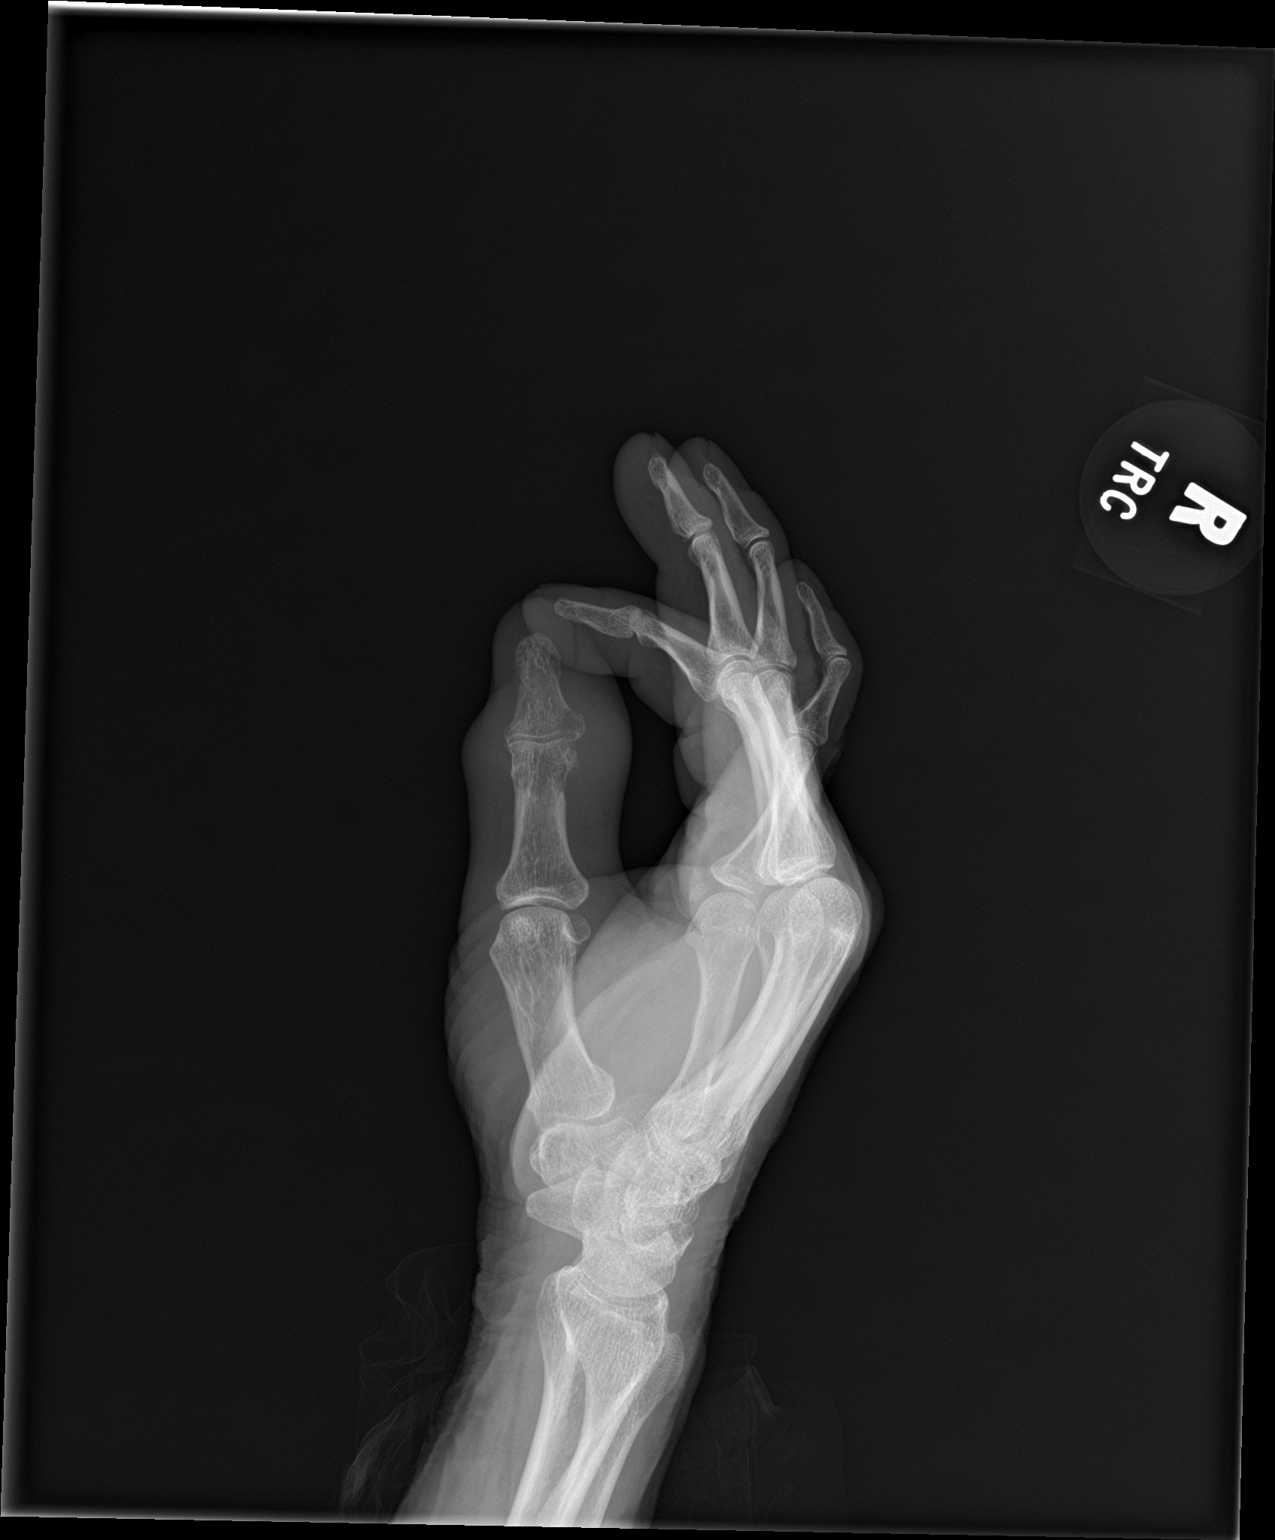

[3 of 3 positions shown; findings below may reference images not displayed]

FINDINGS: No cortical erosion or destruction. There is no evidence of fracture
or dislocation. Radioulnar joint degenerative changes. No aggressive
appearing focal bone abnormality. Subcutaneus soft tissue edema of
the first digit. No retained radiopaque foreign body.
IMPRESSION: No acute displaced fracture or dislocation.

## 2022-09-07 IMAGING — US US EXTREM LOW VENOUS*L*
1 series · 14 of 24 positions shown · non-contrast
Comparison: None.

CLINICAL DATA: Left foot swelling and pain

EXAM:
LEFT LOWER EXTREMITY VENOUS DOPPLER ULTRASOUND
TECHNIQUE: Gray-scale sonography with compression, as well as color and duplex
ultrasound, were performed to evaluate the deep venous system(s)
from the level of the common femoral vein through the popliteal and
proximal calf veins.

[Series 1: us venous img lower uni left (dvt) · portal-venous · 14 of 33 slices shown]
[im 1/33]
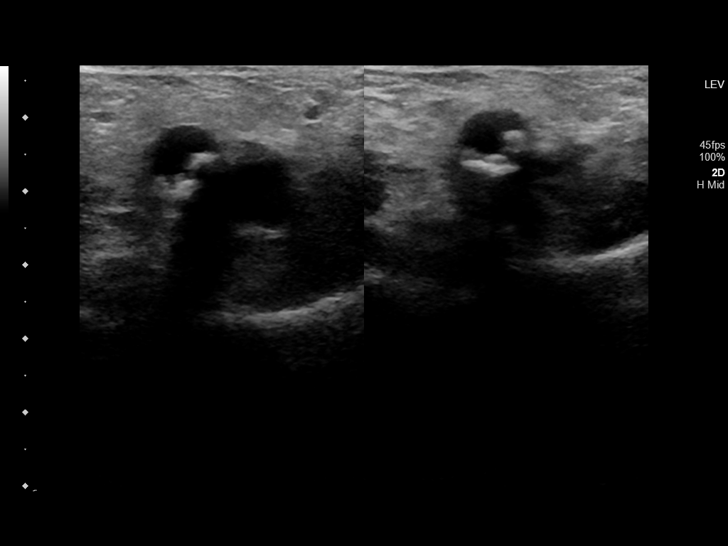
[im 3/33]
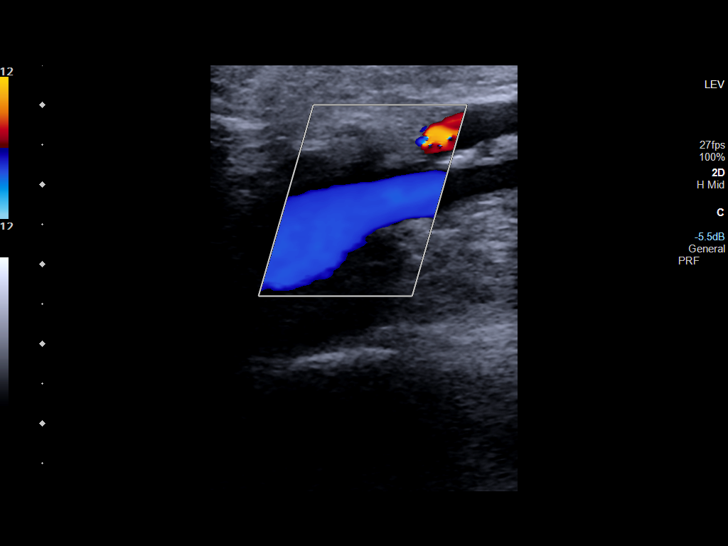
[im 6/33]
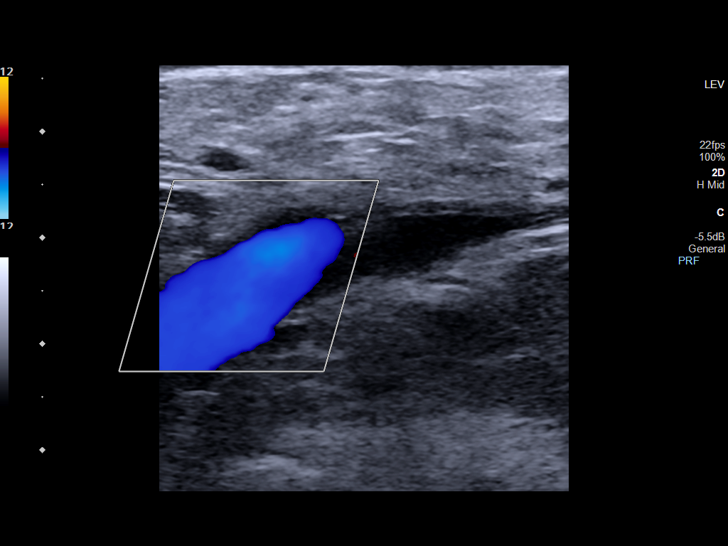
[im 9/33]
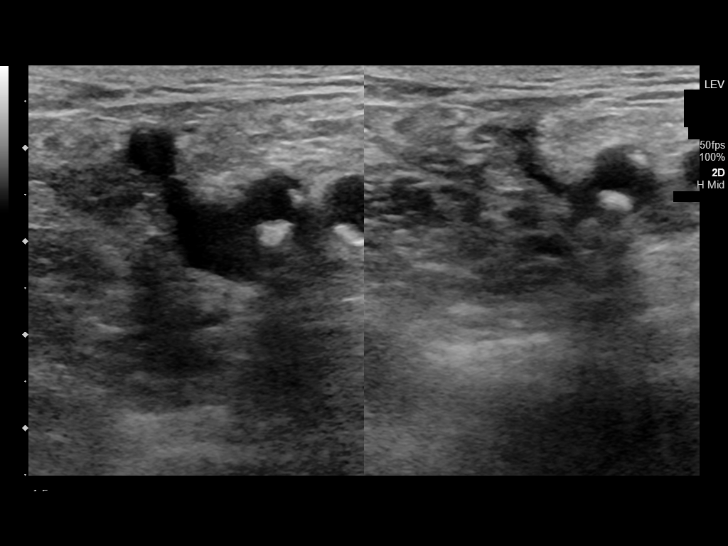
[im 10/33]
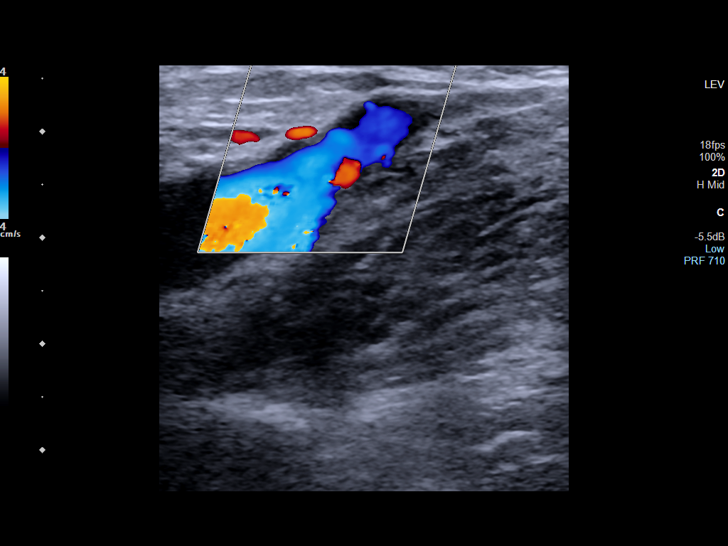
[im 13/33]
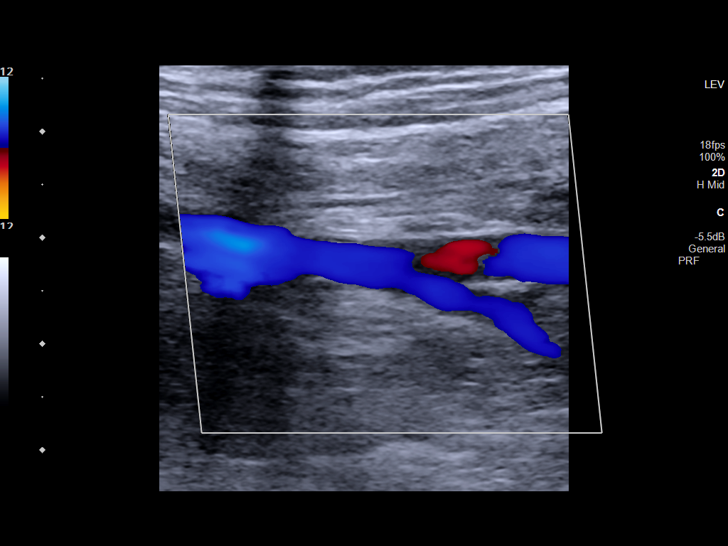
[im 16/33]
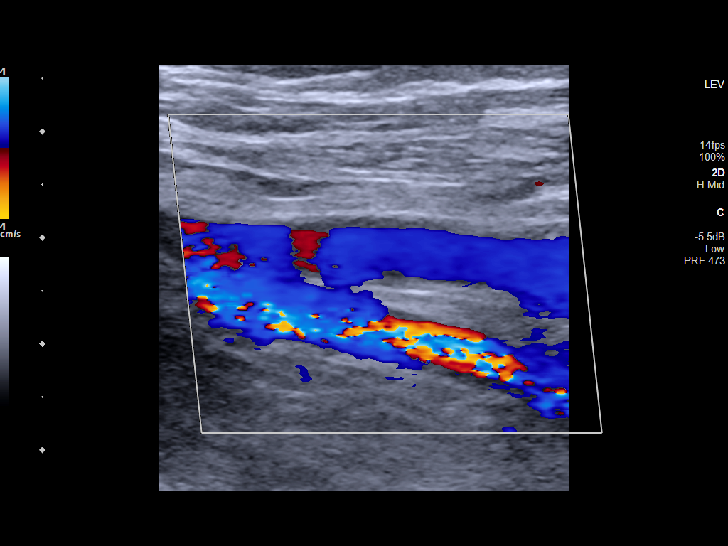
[im 17/33]
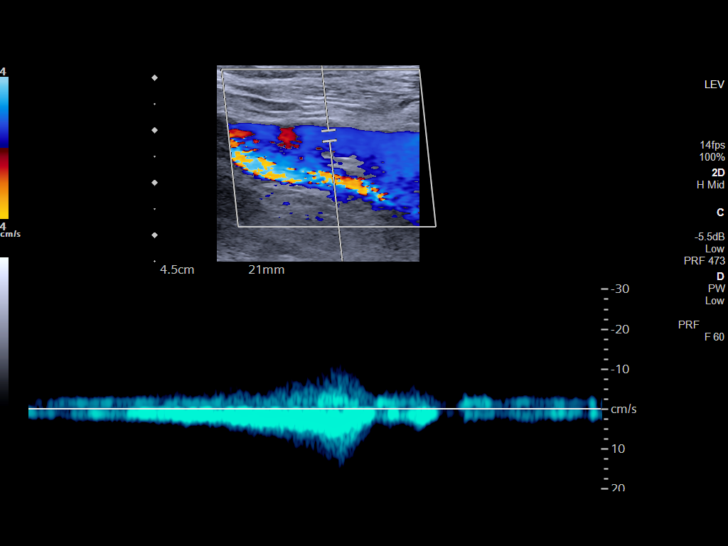
[im 20/33]
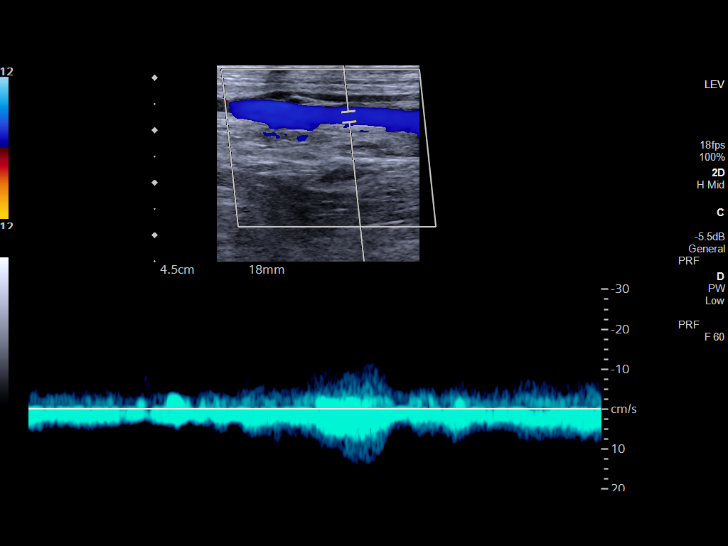
[im 23/33]
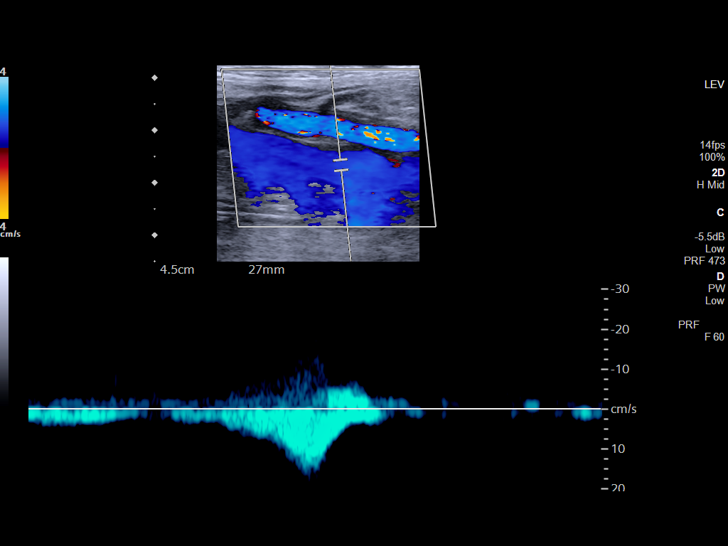
[im 26/33]
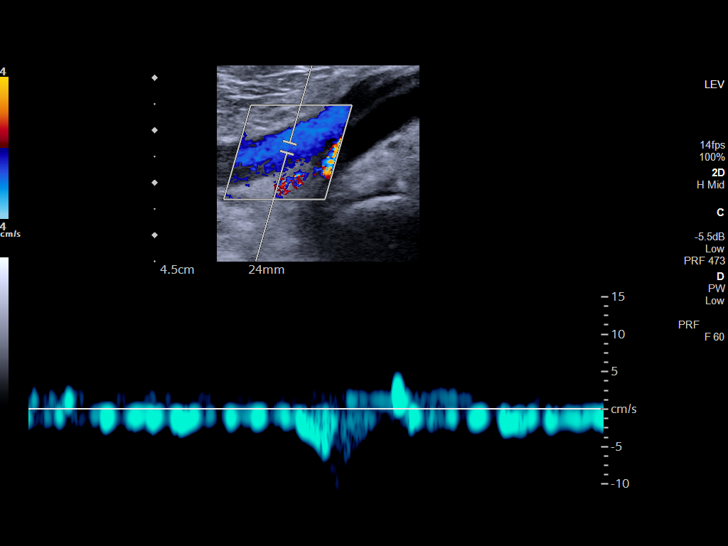
[im 27/33]
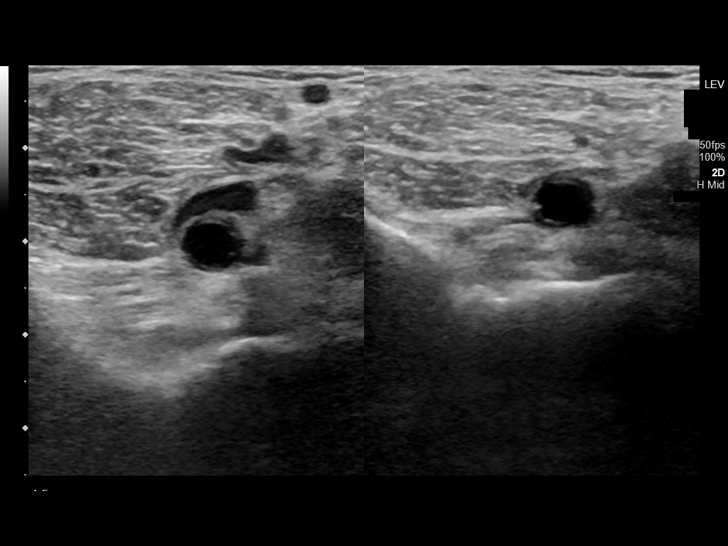
[im 30/33]
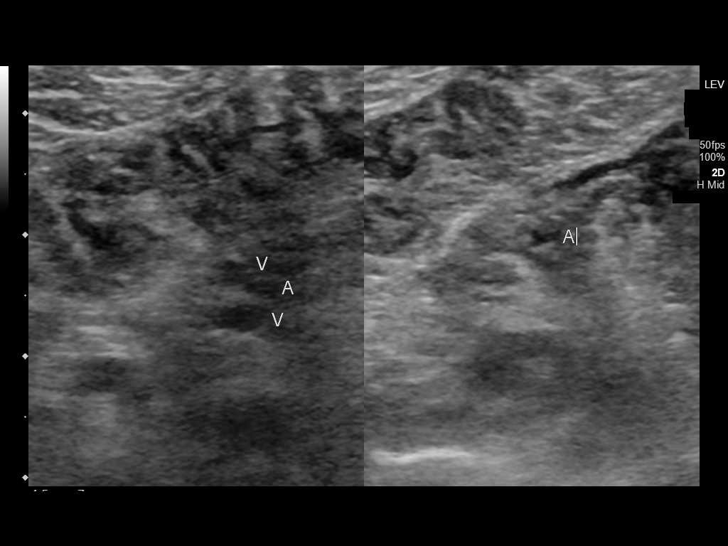
[im 33/33]
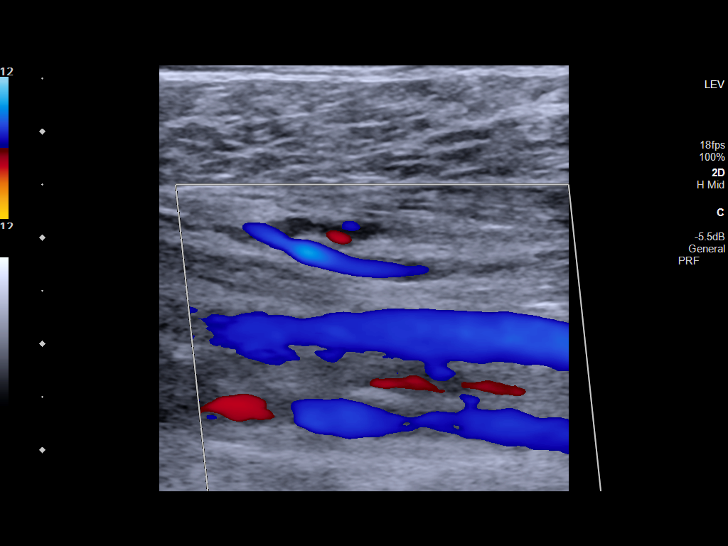

[14 of 24 positions shown; findings below may reference images not displayed]

FINDINGS: VENOUS

Normal compressibility of the common femoral, superficial femoral,
and popliteal veins, as well as the visualized calf veins.
Visualized portions of profunda femoral vein and great saphenous
vein unremarkable. No filling defects to suggest DVT on grayscale or
color Doppler imaging. Doppler waveforms show normal direction of
venous flow, normal respiratory plasticity and response to
augmentation.

Limited views of the contralateral common femoral vein are
unremarkable.

OTHER

None.

Limitations: none
IMPRESSION: Negative for DVT.

## 2022-12-19 ENCOUNTER — Other Ambulatory Visit: Payer: Self-pay | Admitting: Nurse Practitioner

## 2022-12-19 ENCOUNTER — Other Ambulatory Visit: Payer: Medicare Other

## 2022-12-22 LAB — CBC WITH DIFFERENTIAL/PLATELET
Basophils Absolute: 0 10*3/uL (ref 0.0–0.2)
Basos: 0 %
EOS (ABSOLUTE): 1.3 10*3/uL — ABNORMAL HIGH (ref 0.0–0.4)
Eos: 17 %
Hematocrit: 43.8 % (ref 37.5–51.0)
Hemoglobin: 14.6 g/dL (ref 13.0–17.7)
Immature Grans (Abs): 0 10*3/uL (ref 0.0–0.1)
Immature Granulocytes: 0 %
Lymphocytes Absolute: 1.7 10*3/uL (ref 0.7–3.1)
Lymphs: 23 %
MCH: 32.9 pg (ref 26.6–33.0)
MCHC: 33.3 g/dL (ref 31.5–35.7)
MCV: 99 fL — ABNORMAL HIGH (ref 79–97)
Monocytes Absolute: 0.5 10*3/uL (ref 0.1–0.9)
Monocytes: 7 %
Neutrophils Absolute: 3.9 10*3/uL (ref 1.4–7.0)
Neutrophils: 53 %
Platelets: 292 10*3/uL (ref 150–450)
RBC: 4.44 x10E6/uL (ref 4.14–5.80)
RDW: 12 % (ref 11.6–15.4)
WBC: 7.4 10*3/uL (ref 3.4–10.8)

## 2022-12-22 LAB — HGB A1C W/O EAG: Hgb A1c MFr Bld: 5.5 % (ref 4.8–5.6)

## 2022-12-22 LAB — COMPREHENSIVE METABOLIC PANEL
ALT: 11 IU/L (ref 0–44)
AST: 22 IU/L (ref 0–40)
Albumin/Globulin Ratio: 1.4 (ref 1.2–2.2)
Albumin: 3.9 g/dL (ref 3.8–4.8)
Alkaline Phosphatase: 62 IU/L (ref 44–121)
BUN/Creatinine Ratio: 13 (ref 10–24)
BUN: 11 mg/dL (ref 8–27)
Bilirubin Total: 0.4 mg/dL (ref 0.0–1.2)
CO2: 23 mmol/L (ref 20–29)
Calcium: 9.3 mg/dL (ref 8.6–10.2)
Chloride: 104 mmol/L (ref 96–106)
Creatinine, Ser: 0.87 mg/dL (ref 0.76–1.27)
Globulin, Total: 2.8 g/dL (ref 1.5–4.5)
Glucose: 79 mg/dL (ref 70–99)
Potassium: 5.1 mmol/L (ref 3.5–5.2)
Sodium: 140 mmol/L (ref 134–144)
Total Protein: 6.7 g/dL (ref 6.0–8.5)
eGFR: 90 mL/min/{1.73_m2} (ref >59)

## 2022-12-22 LAB — PSA, SERUM (SERIAL MONITOR): Prostate Specific Ag, Serum: 34.9 ng/mL — ABNORMAL HIGH (ref 0.0–4.0)

## 2022-12-22 LAB — LIPID PANEL W/O CHOL/HDL RATIO
Cholesterol, Total: 168 mg/dL (ref 100–199)
HDL: 42 mg/dL (ref >39)
LDL Chol Calc (NIH): 104 mg/dL — ABNORMAL HIGH (ref 0–99)
Triglycerides: 122 mg/dL (ref 0–149)
VLDL Cholesterol Cal: 22 mg/dL (ref 5–40)

## 2022-12-22 LAB — TSH: TSH: 0.926 u[IU]/mL (ref 0.450–4.500)

## 2022-12-24 ENCOUNTER — Ambulatory Visit (INDEPENDENT_AMBULATORY_CARE_PROVIDER_SITE_OTHER): Payer: Medicare Other | Admitting: Nurse Practitioner

## 2022-12-24 VITALS — BP 122/64 | HR 93 | Ht <= 58 in | Wt 100.0 lb

## 2022-12-24 DIAGNOSIS — F17209 Nicotine dependence, unspecified, with unspecified nicotine-induced disorders: Secondary | ICD-10-CM | POA: Diagnosis not present

## 2022-12-24 DIAGNOSIS — I509 Heart failure, unspecified: Secondary | ICD-10-CM | POA: Diagnosis not present

## 2022-12-24 DIAGNOSIS — E782 Mixed hyperlipidemia: Secondary | ICD-10-CM | POA: Diagnosis not present

## 2022-12-24 DIAGNOSIS — J449 Chronic obstructive pulmonary disease, unspecified: Secondary | ICD-10-CM | POA: Diagnosis not present

## 2022-12-24 DIAGNOSIS — N400 Enlarged prostate without lower urinary tract symptoms: Secondary | ICD-10-CM

## 2022-12-24 NOTE — Patient Instructions (Signed)
1) Call [facility] to see if upcoming appt due to elevated PSA 2) Follow up appt in 4 months, fasting labs prior

## 2022-12-24 NOTE — Progress Notes (Signed)
Established Patient Office Visit  Subjective:  Patient ID: Cody [name], male    DOB: July 09, 1946  Age: 77 y.o. MRN: [id number]  Chief Complaint  Patient presents with   Follow-up    4 [facility]ths follow up     Follow up and lab results discussed.  PS elevated, he does not have a follow up appt with [facility].  LDL only elevated a few points.       Past Medical History:  Diagnosis Date   Alcohol dependence ([place])    CHF (congestive heart failure) (HCC)    Hypertension    Tobacco abuse     Social History   Socioeconomic History   Marital status: Married    Spouse name: Not on file   Number of children: Not on file   Years of education: Not on file   Highest education level: Not on file  Occupational History   Not on file  Tobacco Use   Smoking status: Every Day    Packs/day: 0.50    Types: Cigarettes    Passive exposure: Current   Smokeless tobacco: Never  Vaping Use   Vaping Use: Never used  Substance and Sexual Activity   Alcohol use: Yes    Alcohol/week: 35.0 standard drinks of alcohol    Types: 35 Cans of beer per week   Drug use: Never   Sexual activity: Not Currently    Birth control/protection: None  Other Topics Concern   Not on file  Social History Narrative   Not on file   Social Determinants of Health   Financial Resource Strain: Low Risk  (08/25/2018)   Overall Financial Resource Strain (CARDIA)    Difficulty of Paying Living Expenses: Not very hard  Food Insecurity: Not on file  Transportation Needs: Unknown (08/25/2018)   PRAPARE - Transportation    Lack of Transportation (Medical): No    Lack of Transportation (Non-Medical): Not on file  Physical Activity: Not on file  Stress: No Stress Concern Present (08/25/2018)   Corn    Feeling of Stress : Only a little  Social Connections: Not on file  Intimate Partner Violence: Not on file    No family history on  file.  Allergies  Allergen Reactions   Shellfish Allergy Hives    Only allergic to crabs    Review of Systems  Constitutional: Negative.   HENT: Negative.    Eyes: Negative.   Respiratory: Negative.    Cardiovascular: Negative.   Gastrointestinal: Negative.   Genitourinary: Negative.   Musculoskeletal: Negative.   Skin: Negative.   Neurological: Negative.   Endo/Heme/Allergies: Negative.   Psychiatric/Behavioral: Negative.    All other systems reviewed and are negative.      Objective:   BP 122/64   Pulse 93   Ht '4\' 9"'$  (1.448 m)   Wt 100 lb (45.4 kg)   SpO2 97%   BMI 21.64 kg/m   Vitals:   12/24/22 0949  BP: 122/64  Pulse: 93  Height: '4\' 9"'$  (1.448 m)  Weight: 100 lb (45.4 kg)  SpO2: 97%  BMI (Calculated): 21.63    Physical Exam Vitals reviewed.  Constitutional:      Appearance: Normal appearance.  HENT:     Head: Normocephalic.     Nose: Nose normal.     Mouth/Throat:     Mouth: Mucous membranes are moist.  Eyes:     Pupils: Pupils are equal, round, and reactive to light.  Cardiovascular:     Rate and Rhythm: Normal rate and regular rhythm.  Pulmonary:     Effort: Pulmonary effort is normal.     Breath sounds: Normal breath sounds.  Abdominal:     General: Bowel sounds are normal.     Palpations: Abdomen is soft.  Musculoskeletal:        General: Normal range of motion.     Cervical back: Normal range of motion and neck supple.  Skin:    General: Skin is warm and dry.  Neurological:     Mental Status: He is alert and oriented to person, place, and time.  Psychiatric:        Mood and Affect: Mood normal.        Behavior: Behavior normal.      No results found for any visits on 12/24/22.  Recent Results (from the past 2160 hour(s))  CBC with Differential/Platelet     Status: Abnormal   Collection Time: 12/19/22  8:41 AM  Result Value Ref Range   WBC 7.4 3.4 - 10.8 x10E3/uL   RBC 4.44 4.14 - 5.80 x10E6/uL   Hemoglobin 14.6 13.0 -  17.7 g/dL   Hematocrit 43.8 37.5 - 51.0 %   MCV 99 (H) 79 - 97 fL   MCH 32.9 26.6 - 33.0 pg   MCHC 33.3 31.5 - 35.7 g/dL   RDW 12.0 11.6 - 15.4 %   Platelets 292 150 - 450 x10E3/uL   Neutrophils 53 Not Estab. %   Lymphs 23 Not Estab. %   Monocytes 7 Not Estab. %   Eos 17 Not Estab. %   Basos 0 Not Estab. %   Neutrophils Absolute 3.9 1.4 - 7.0 x10E3/uL   Lymphocytes Absolute 1.7 0.7 - 3.1 x10E3/uL   Monocytes Absolute 0.5 0.1 - 0.9 x10E3/uL   EOS (ABSOLUTE) 1.3 (H) 0.0 - 0.4 x10E3/uL   Basophils Absolute 0.0 0.0 - 0.2 x10E3/uL   Immature Granulocytes 0 Not Estab. %   Immature Grans (Abs) 0.0 0.0 - 0.1 x10E3/uL  Comprehensive metabolic panel     Status: None   Collection Time: 12/19/22  8:41 AM  Result Value Ref Range   Glucose 79 70 - 99 mg/dL   BUN 11 8 - 27 mg/dL   Creatinine, Ser 0.87 0.76 - 1.27 mg/dL   eGFR 90 >59 mL/min/1.73   BUN/Creatinine Ratio 13 10 - 24   Sodium 140 134 - 144 mmol/L   Potassium 5.1 3.5 - 5.2 mmol/L   Chloride 104 96 - 106 mmol/L   CO2 23 20 - 29 mmol/L   Calcium 9.3 8.6 - 10.2 mg/dL   Total Protein 6.7 6.0 - 8.5 g/dL   Albumin 3.9 3.8 - 4.8 g/dL   Globulin, Total 2.8 1.5 - 4.5 g/dL   Albumin/Globulin Ratio 1.4 1.2 - 2.2   Bilirubin Total 0.4 0.0 - 1.2 mg/dL   Alkaline Phosphatase 62 44 - 121 IU/L   AST 22 0 - 40 IU/L   ALT 11 0 - 44 IU/L  Lipid Panel w/o Chol/HDL Ratio     Status: Abnormal   Collection Time: 12/19/22  8:41 AM  Result Value Ref Range   Cholesterol, Total 168 100 - 199 mg/dL   Triglycerides 122 0 - 149 mg/dL   HDL 42 >39 mg/dL   VLDL Cholesterol Cal 22 5 - 40 mg/dL   LDL Chol Calc (NIH) 104 (H) 0 - 99 mg/dL  PSA, SERUM (SERIAL MONITOR)     Status: Abnormal  Collection Time: 12/19/22  8:41 AM  Result Value Ref Range   Prostate Specific Ag, Serum 34.9 (H) 0.0 - 4.0 ng/mL    Comment: Roche ECLIA methodology. According to the American Urological Association, Serum PSA should decrease and remain at undetectable levels after  radical prostatectomy. The AUA defines biochemical recurrence as an initial PSA value 0.2 ng/mL or greater followed by a subsequent confirmatory PSA value 0.2 ng/mL or greater. Values obtained with different assay methods or kits cannot be used interchangeably. Results cannot be interpreted as absolute evidence of the presence or absence of malignant disease.   Hgb A1c w/o eAG     Status: None   Collection Time: 12/19/22  8:41 AM  Result Value Ref Range   Hgb A1c MFr Bld 5.5 4.8 - 5.6 %    Comment:          Prediabetes: 5.7 - 6.4          Diabetes: >6.4          Glycemic control for adults with diabetes: <7.0   TSH     Status: None   Collection Time: 12/19/22  8:41 AM  Result Value Ref Range   TSH 0.926 0.450 - 4.500 uIU/mL      Assessment & Plan:   Problem List Items Addressed This Visit       Cardiovascular and Mediastinum   Congestive heart failure (HCC) - Primary     Respiratory   Chronic obstructive pulmonary disease (HCC)     Genitourinary   Benign prostatic hyperplasia without lower urinary tract symptoms     Other   Mixed hyperlipidemia   Nicotine dependence with nicotine-induced disorder    Return in about 4 months (around 04/24/2023).   Total time spent: 15 minutes  Evern Bio, NP  12/24/2022

## 2023-03-05 ENCOUNTER — Inpatient Hospital Stay
Admission: EM | Admit: 2023-03-05 | Discharge: 2023-03-12 | DRG: 080 | Disposition: A | Discharge status: Home Health | Payer: Medicare Other | Attending: Student | Admitting: Student

## 2023-03-05 ENCOUNTER — Inpatient Hospital Stay: Payer: Medicare Other

## 2023-03-05 ENCOUNTER — Other Ambulatory Visit: Payer: Self-pay

## 2023-03-05 ENCOUNTER — Emergency Department: Payer: Medicare Other

## 2023-03-05 ENCOUNTER — Encounter: Payer: Self-pay | Admitting: Internal Medicine

## 2023-03-05 ENCOUNTER — Inpatient Hospital Stay (HOSPITAL_COMMUNITY)
Admit: 2023-03-05 | Discharge: 2023-03-05 | Disposition: A | Discharge status: Home / Self Care | Payer: Medicare Other | Attending: Internal Medicine | Admitting: Internal Medicine

## 2023-03-05 DIAGNOSIS — D509 Iron deficiency anemia, unspecified: Secondary | ICD-10-CM | POA: Diagnosis present

## 2023-03-05 DIAGNOSIS — E871 Hypo-osmolality and hyponatremia: Secondary | ICD-10-CM | POA: Diagnosis present

## 2023-03-05 DIAGNOSIS — M109 Gout, unspecified: Secondary | ICD-10-CM | POA: Diagnosis present

## 2023-03-05 DIAGNOSIS — C7931 Secondary malignant neoplasm of brain: Secondary | ICD-10-CM | POA: Diagnosis present

## 2023-03-05 DIAGNOSIS — Z8673 Personal history of transient ischemic attack (TIA), and cerebral infarction without residual deficits: Secondary | ICD-10-CM | POA: Diagnosis not present

## 2023-03-05 DIAGNOSIS — R338 Other retention of urine: Secondary | ICD-10-CM | POA: Diagnosis not present

## 2023-03-05 DIAGNOSIS — J9601 Acute respiratory failure with hypoxia: Secondary | ICD-10-CM | POA: Diagnosis present

## 2023-03-05 DIAGNOSIS — J441 Chronic obstructive pulmonary disease with (acute) exacerbation: Secondary | ICD-10-CM

## 2023-03-05 DIAGNOSIS — R7989 Other specified abnormal findings of blood chemistry: Secondary | ICD-10-CM | POA: Diagnosis not present

## 2023-03-05 DIAGNOSIS — G928 Other toxic encephalopathy: Secondary | ICD-10-CM | POA: Diagnosis present

## 2023-03-05 DIAGNOSIS — J189 Pneumonia, unspecified organism: Secondary | ICD-10-CM | POA: Diagnosis not present

## 2023-03-05 DIAGNOSIS — Z7189 Other specified counseling: Secondary | ICD-10-CM

## 2023-03-05 DIAGNOSIS — R188 Other ascites: Secondary | ICD-10-CM | POA: Diagnosis present

## 2023-03-05 DIAGNOSIS — I110 Hypertensive heart disease with heart failure: Secondary | ICD-10-CM | POA: Diagnosis present

## 2023-03-05 DIAGNOSIS — F17210 Nicotine dependence, cigarettes, uncomplicated: Secondary | ICD-10-CM | POA: Diagnosis present

## 2023-03-05 DIAGNOSIS — G9389 Other specified disorders of brain: Secondary | ICD-10-CM | POA: Diagnosis not present

## 2023-03-05 DIAGNOSIS — I509 Heart failure, unspecified: Secondary | ICD-10-CM

## 2023-03-05 DIAGNOSIS — R4182 Altered mental status, unspecified: Secondary | ICD-10-CM | POA: Diagnosis not present

## 2023-03-05 DIAGNOSIS — Z515 Encounter for palliative care: Secondary | ICD-10-CM | POA: Diagnosis not present

## 2023-03-05 DIAGNOSIS — I480 Paroxysmal atrial fibrillation: Secondary | ICD-10-CM | POA: Diagnosis present

## 2023-03-05 DIAGNOSIS — G936 Cerebral edema: Principal | ICD-10-CM | POA: Diagnosis present

## 2023-03-05 DIAGNOSIS — N401 Enlarged prostate with lower urinary tract symptoms: Secondary | ICD-10-CM | POA: Diagnosis present

## 2023-03-05 DIAGNOSIS — F1020 Alcohol dependence, uncomplicated: Secondary | ICD-10-CM | POA: Diagnosis present

## 2023-03-05 DIAGNOSIS — J90 Pleural effusion, not elsewhere classified: Secondary | ICD-10-CM | POA: Diagnosis not present

## 2023-03-05 DIAGNOSIS — J440 Chronic obstructive pulmonary disease with acute lower respiratory infection: Secondary | ICD-10-CM | POA: Diagnosis present

## 2023-03-05 DIAGNOSIS — C3490 Malignant neoplasm of unspecified part of unspecified bronchus or lung: Secondary | ICD-10-CM | POA: Diagnosis present

## 2023-03-05 DIAGNOSIS — Z7982 Long term (current) use of aspirin: Secondary | ICD-10-CM

## 2023-03-05 DIAGNOSIS — I3131 Malignant pericardial effusion in diseases classified elsewhere: Secondary | ICD-10-CM | POA: Diagnosis present

## 2023-03-05 DIAGNOSIS — Z91199 Patient's noncompliance with other medical treatment and regimen due to unspecified reason: Secondary | ICD-10-CM

## 2023-03-05 DIAGNOSIS — J910 Malignant pleural effusion: Secondary | ICD-10-CM | POA: Diagnosis present

## 2023-03-05 DIAGNOSIS — I5032 Chronic diastolic (congestive) heart failure: Secondary | ICD-10-CM | POA: Diagnosis present

## 2023-03-05 DIAGNOSIS — N400 Enlarged prostate without lower urinary tract symptoms: Secondary | ICD-10-CM

## 2023-03-05 DIAGNOSIS — I3139 Other pericardial effusion (noninflammatory): Secondary | ICD-10-CM | POA: Diagnosis not present

## 2023-03-05 DIAGNOSIS — I314 Cardiac tamponade: Secondary | ICD-10-CM | POA: Diagnosis present

## 2023-03-05 DIAGNOSIS — R451 Restlessness and agitation: Secondary | ICD-10-CM | POA: Diagnosis not present

## 2023-03-05 DIAGNOSIS — G9340 Encephalopathy, unspecified: Secondary | ICD-10-CM | POA: Diagnosis present

## 2023-03-05 DIAGNOSIS — I491 Atrial premature depolarization: Secondary | ICD-10-CM | POA: Diagnosis not present

## 2023-03-05 DIAGNOSIS — J449 Chronic obstructive pulmonary disease, unspecified: Secondary | ICD-10-CM | POA: Diagnosis present

## 2023-03-05 DIAGNOSIS — I2510 Atherosclerotic heart disease of native coronary artery without angina pectoris: Secondary | ICD-10-CM | POA: Diagnosis present

## 2023-03-05 DIAGNOSIS — F1090 Alcohol use, unspecified, uncomplicated: Secondary | ICD-10-CM | POA: Diagnosis not present

## 2023-03-05 DIAGNOSIS — E559 Vitamin D deficiency, unspecified: Secondary | ICD-10-CM | POA: Diagnosis present

## 2023-03-05 DIAGNOSIS — Z91013 Allergy to seafood: Secondary | ICD-10-CM

## 2023-03-05 DIAGNOSIS — Z79899 Other long term (current) drug therapy: Secondary | ICD-10-CM

## 2023-03-05 DIAGNOSIS — E874 Mixed disorder of acid-base balance: Secondary | ICD-10-CM | POA: Diagnosis present

## 2023-03-05 DIAGNOSIS — R739 Hyperglycemia, unspecified: Secondary | ICD-10-CM | POA: Diagnosis not present

## 2023-03-05 LAB — BRAIN NATRIURETIC PEPTIDE: B Natriuretic Peptide: 99.4 pg/mL (ref 0.0–100.0)

## 2023-03-05 LAB — COMPREHENSIVE METABOLIC PANEL
ALT: 214 U/L — ABNORMAL HIGH (ref 0–44)
AST: 333 U/L — ABNORMAL HIGH (ref 15–41)
Albumin: 3 g/dL — ABNORMAL LOW (ref 3.5–5.0)
Alkaline Phosphatase: 84 U/L (ref 38–126)
Anion gap: 12 (ref 5–15)
BUN: 39 mg/dL — ABNORMAL HIGH (ref 8–23)
CO2: 18 mmol/L — ABNORMAL LOW (ref 22–32)
Calcium: 8.5 mg/dL — ABNORMAL LOW (ref 8.9–10.3)
Chloride: 87 mmol/L — ABNORMAL LOW (ref 98–111)
Creatinine, Ser: 1.18 mg/dL (ref 0.61–1.24)
GFR, Estimated: >60 mL/min (ref >60)
Glucose, Bld: 154 mg/dL — ABNORMAL HIGH (ref 70–99)
Potassium: 4.9 mmol/L (ref 3.5–5.1)
Sodium: 117 mmol/L — CL (ref 135–145)
Total Bilirubin: 1 mg/dL (ref 0.3–1.2)
Total Protein: 7.5 g/dL (ref 6.5–8.1)

## 2023-03-05 LAB — CBC
HCT: 32.8 % — ABNORMAL LOW (ref 39.0–52.0)
Hemoglobin: 10.9 g/dL — ABNORMAL LOW (ref 13.0–17.0)
MCH: 31.1 pg (ref 26.0–34.0)
MCHC: 33.2 g/dL (ref 30.0–36.0)
MCV: 93.4 fL (ref 80.0–100.0)
Platelets: 357 10*3/uL (ref 150–400)
RBC: 3.51 MIL/uL — ABNORMAL LOW (ref 4.22–5.81)
RDW: 13.2 % (ref 11.5–15.5)
WBC: 13.2 10*3/uL — ABNORMAL HIGH (ref 4.0–10.5)
nRBC: 0 % (ref 0.0–0.2)

## 2023-03-05 LAB — SODIUM, URINE, RANDOM: Sodium, Ur: <10 mmol/L

## 2023-03-05 LAB — SODIUM
Sodium: 118 mmol/L — CL (ref 135–145)
Sodium: 123 mmol/L — ABNORMAL LOW (ref 135–145)

## 2023-03-05 LAB — ECHOCARDIOGRAM COMPLETE

## 2023-03-05 LAB — OSMOLALITY: Osmolality: 276 mOsm/kg (ref 275–295)

## 2023-03-05 LAB — LACTATE DEHYDROGENASE: LDH: 694 U/L — ABNORMAL HIGH (ref 98–192)

## 2023-03-05 LAB — OSMOLALITY, URINE: Osmolality, Ur: 694 mOsm/kg (ref 300–900)

## 2023-03-05 MED ORDER — FINASTERIDE 5 MG PO TABS: 5.0000 mg | ORAL_TABLET | Freq: Every day | ORAL | Status: DC

## 2023-03-05 MED ORDER — ADULT MULTIVITAMIN W/MINERALS CH
1.0000 | ORAL_TABLET | Freq: Every day | ORAL | Status: DC
  Administered 2023-03-05 – 2023-03-12 (×7): 1 via ORAL
  Filled 2023-03-05 (×7): qty 1

## 2023-03-05 MED ORDER — ONDANSETRON HCL 4 MG/2ML IJ SOLN: 4.0000 mg | Freq: Four times a day (QID) | INTRAMUSCULAR | Status: DC | PRN

## 2023-03-05 MED ORDER — SODIUM CHLORIDE 0.9 % IV SOLN
1.0000 g | Freq: Once | INTRAVENOUS | Status: AC
  Administered 2023-03-05: 1 g via INTRAVENOUS
  Filled 2023-03-05: qty 10

## 2023-03-05 MED ORDER — GADOBUTROL 1 MMOL/ML IV SOLN
5.0000 mL | Freq: Once | INTRAVENOUS | Status: AC | PRN
  Administered 2023-03-05: 5 mL via INTRAVENOUS

## 2023-03-05 MED ORDER — ONDANSETRON HCL 4 MG PO TABS: 4.0000 mg | ORAL_TABLET | Freq: Four times a day (QID) | ORAL | Status: DC | PRN

## 2023-03-05 MED ORDER — SODIUM CHLORIDE 0.9 % IV SOLN
2.0000 g | INTRAVENOUS | Status: DC
  Administered 2023-03-06 – 2023-03-11 (×6): 2 g via INTRAVENOUS
  Filled 2023-03-05: qty 20
  Filled 2023-03-05 (×2): qty 2
  Filled 2023-03-05 (×2): qty 20
  Filled 2023-03-05: qty 2

## 2023-03-05 MED ORDER — IOHEXOL 300 MG/ML  SOLN
75.0000 mL | Freq: Once | INTRAMUSCULAR | Status: AC | PRN
  Administered 2023-03-05: 75 mL via INTRAVENOUS

## 2023-03-05 MED ORDER — LORAZEPAM 2 MG/ML IJ SOLN
1.0000 mg | INTRAMUSCULAR | Status: DC | PRN
  Administered 2023-03-07: 4 mg via INTRAVENOUS
  Filled 2023-03-05: qty 2

## 2023-03-05 MED ORDER — ACETAMINOPHEN 650 MG RE SUPP: 650.0000 mg | Freq: Four times a day (QID) | RECTAL | Status: DC | PRN

## 2023-03-05 MED ORDER — SODIUM CHLORIDE 0.9 % IV SOLN
500.0000 mg | Freq: Once | INTRAVENOUS | Status: AC
  Administered 2023-03-05: 500 mg via INTRAVENOUS
  Filled 2023-03-05: qty 5

## 2023-03-05 MED ORDER — ACETAMINOPHEN 325 MG PO TABS: 650.0000 mg | ORAL_TABLET | Freq: Four times a day (QID) | ORAL | Status: DC | PRN

## 2023-03-05 MED ORDER — LEVETIRACETAM IN NACL 500 MG/100ML IV SOLN
500.0000 mg | Freq: Two times a day (BID) | INTRAVENOUS | Status: DC
  Administered 2023-03-05 – 2023-03-11 (×12): 500 mg via INTRAVENOUS
  Filled 2023-03-05 (×13): qty 100

## 2023-03-05 MED ORDER — FOLIC ACID 1 MG PO TABS
1.0000 mg | ORAL_TABLET | Freq: Every day | ORAL | Status: DC
  Administered 2023-03-05 – 2023-03-12 (×7): 1 mg via ORAL
  Filled 2023-03-05 (×7): qty 1

## 2023-03-05 MED ORDER — DEXAMETHASONE 4 MG PO TABS
4.0000 mg | ORAL_TABLET | Freq: Every day | ORAL | Status: DC
  Administered 2023-03-06 – 2023-03-08 (×2): 4 mg via ORAL
  Filled 2023-03-05 (×3): qty 1

## 2023-03-05 MED ORDER — LORAZEPAM 1 MG PO TABS: 1.0000 mg | ORAL_TABLET | ORAL | Status: DC | PRN

## 2023-03-05 MED ORDER — SODIUM CHLORIDE 3 % IV SOLN
INTRAVENOUS | Status: DC
  Filled 2023-03-05 (×4): qty 500

## 2023-03-05 MED ORDER — SODIUM CHLORIDE 0.9 % IV SOLN
500.0000 mg | INTRAVENOUS | Status: DC
  Administered 2023-03-06: 500 mg via INTRAVENOUS
  Filled 2023-03-05: qty 500
  Filled 2023-03-05: qty 5

## 2023-03-05 MED ORDER — DEXAMETHASONE SODIUM PHOSPHATE 10 MG/ML IJ SOLN
8.0000 mg | Freq: Once | INTRAMUSCULAR | Status: AC
  Administered 2023-03-05: 8 mg via INTRAVENOUS
  Filled 2023-03-05: qty 1

## 2023-03-05 MED ORDER — THIAMINE MONONITRATE 100 MG PO TABS
100.0000 mg | ORAL_TABLET | Freq: Every day | ORAL | Status: DC
  Administered 2023-03-05 – 2023-03-12 (×6): 100 mg via ORAL
  Filled 2023-03-05 (×6): qty 1

## 2023-03-05 MED ORDER — TAMSULOSIN HCL 0.4 MG PO CAPS
0.4000 mg | ORAL_CAPSULE | Freq: Every day | ORAL | Status: DC
  Administered 2023-03-05 – 2023-03-12 (×7): 0.4 mg via ORAL
  Filled 2023-03-05 (×7): qty 1

## 2023-03-05 MED ORDER — SODIUM CHLORIDE 0.9 % IV SOLN: Freq: Once | INTRAVENOUS | Status: DC

## 2023-03-05 MED ORDER — THIAMINE HCL 100 MG/ML IJ SOLN
100.0000 mg | Freq: Every day | INTRAMUSCULAR | Status: DC
  Administered 2023-03-06: 100 mg via INTRAVENOUS
  Filled 2023-03-05 (×3): qty 2

## 2023-03-05 MED ORDER — SODIUM CHLORIDE 0.9% FLUSH
3.0000 mL | Freq: Two times a day (BID) | INTRAVENOUS | Status: DC
  Administered 2023-03-05 – 2023-03-12 (×11): 3 mL via INTRAVENOUS

## 2023-03-05 NOTE — Assessment & Plan Note (Signed)
Likely in the setting of active alcohol use disorder.  No prior diagnosis of cirrhosis, however there is evidence of ascites.  Platelets are normal.  Will check a PT/INR  - Daily CMP

## 2023-03-05 NOTE — Assessment & Plan Note (Signed)
Large pericardial effusion seen on CT imaging.  Patient has some JVD on exam, however no evidence of tamponade physiology.  He is hemodynamically stable with no tachycardia or hypotension.  I suspect that this has been a gradual chronic process with underlying malignancy as possible etiology  - Echocardiogram ordered

## 2023-03-05 NOTE — ED Triage Notes (Signed)
Pt son in law brings pt in today from home. Per family pt has been having memory loss for the last week. Pt is not able to talk in full sentences that make sense. Family also sts that pt has been falling and having left sided flank pain since falling on that side.

## 2023-03-05 NOTE — Assessment & Plan Note (Deleted)
Bilateral pleural effusions noted on CT imaging.  Given shortness of breath and need for sample, will order an ultrasound guided thoracentesis  - Ultrasound-guided thoracentesis ordered - Pleural fluid studies to include cytology, cell count, culture, protein, glucose, and LDH

## 2023-03-05 NOTE — Assessment & Plan Note (Signed)
Lesion seen on CT imaging concerning for metastatic disease of unknown primary.  Pan scan demonstrated pericardial effusion and pleural effusions in addition to significant paratracheal and subcarinal lymphadenopathy.  Given shortness of breath, will start with a thoracentesis with sample sent for cytology.  Will also discuss biopsy with neurosurgery.  Lastly, will involve oncology for their input.  - Consult neurosurgery and oncology as noted above

## 2023-03-05 NOTE — ED Provider Notes (Signed)
[facility] Provider Note    Event Date/Time   First MD Initiated Contact with Patient 03/05/23 1217     (approximate)   History   Memory Loss   HPI  Cody [name] is a 77 y.o. male who presents with altered mental status, weakness, left-sided chest discomfort and shortness of breath.  Family reports that patient has had episodes of incontinence and confusion over the last week.  This is atypical for him.  They also note that he has complained of shortness of breath and left-sided chest discomfort     Physical Exam   Triage Vital Signs: ED Triage Vitals [03/05/23 1109]  Enc Vitals Group     BP 123/76     Pulse Rate 96     Resp 17     Temp 98.4 F (36.9 C)     Temp Source Axillary     SpO2 98 %     Weight 45.4 kg (100 lb)     Height      Head Circumference      Peak Flow      Pain Score 4     Pain Loc      Pain Edu?      Excl. in GC?     Most recent vital signs: Vitals:   03/05/23 1233 03/05/23 1327  BP: 126/80 138/78  Pulse: 90 87  Resp: 16 (!) 27  Temp:  (!) 97.5 F (36.4 C)  SpO2: 95% 95%     General: Awake, no distress.  CV:  Good peripheral perfusion.  No chest wall tenderness to palpation  resp:  Bibasal Rales, mild tachypnea Abd:  Abdomen soft, no distention Other:  No calf pain or swelling    ED Results / Procedures / Treatments   Labs (all labs ordered are listed, but only abnormal results are displayed) Labs Reviewed  COMPREHENSIVE METABOLIC PANEL - Abnormal; Notable for the following components:      Result Value   Sodium 117 (*)    Chloride 87 (*)    CO2 18 (*)    Glucose, Bld 154 (*)    BUN 39 (*)    Calcium 8.5 (*)    Albumin 3.0 (*)    AST 333 (*)    ALT 214 (*)    All other components within normal limits  CBC - Abnormal; Notable for the following components:   WBC 13.2 (*)    RBC 3.51 (*)    Hemoglobin 10.9 (*)    HCT 32.8 (*)    All other components within normal limits  CULTURE, BLOOD (ROUTINE X  2)  CULTURE, BLOOD (ROUTINE X 2)  BRAIN NATRIURETIC PEPTIDE  OSMOLALITY  OSMOLALITY, URINE  SODIUM, URINE, RANDOM  SODIUM  SODIUM  SODIUM  SODIUM     EKG  ED ECG REPORT I, Jene Every, the attending physician, personally viewed and interpreted this ECG.  Date: 03/05/2023  Rhythm: normal sinus rhythm QRS Axis: normal Intervals: normal ST/T Wave abnormalities: normal Narrative Interpretation: no evidence of acute ischemia    RADIOLOGY Chest x-ray viewed interpret by me, possible left lower pneumonia, pending radiology review    PROCEDURES:  Critical Care performed: yes  CRITICAL CARE Performed by: Jene Every   Total critical care time: 30 minutes  Critical care time was exclusive of separately billable procedures and treating other patients.  Critical care was necessary to treat or prevent imminent or life-threatening deterioration.  Critical care was time spent personally by me  on the following activities: development of treatment plan with patient and/or surrogate as well as nursing, discussions with consultants, evaluation of patient's response to treatment, examination of patient, obtaining history from patient or surrogate, ordering and performing treatments and interventions, ordering and review of laboratory studies, ordering and review of radiographic studies, pulse oximetry and re-evaluation of patient's condition.   Procedures   MEDICATIONS ORDERED IN ED: Medications  cefTRIAXone (ROCEPHIN) 1 g in sodium chloride 0.9 % 100 mL IVPB (1 g Intravenous New Bag/Given 03/05/23 1421)  azithromycin (ZITHROMAX) 500 mg in sodium chloride 0.9 % 250 mL IVPB (has no administration in time range)  sodium chloride (hypertonic) 3 % solution (has no administration in time range)  dexamethasone (DECADRON) injection 8 mg (8 mg Intravenous Given 03/05/23 1408)  iohexol (OMNIPAQUE) 300 MG/ML solution 75 mL (75 mLs Intravenous Contrast Given 03/05/23 1428)      IMPRESSION / MDM / ASSESSMENT AND PLAN / ED COURSE  I reviewed the triage vital signs and the nursing notes. Patient's presentation is most consistent with acute presentation with potential threat to life or bodily function.  Patient presents with altered mental status as described above, episodes of incontinence, chest discomfort, shortness of breath.  Multiple possibilities on the differential.  Including electrolyte abnormalities, pneumonia, malignancy, ICH  Patient's sodium is 117, critically low, this could be a cause of his weakness and symptoms.  Chest x-ray demonstrates interstitial edema, pleural effusion, questionable consolidation in the left lower quadrant, given that he has pain in this area, suspicious for pneumonia, will cover with IV Rocephin and IV azithromycin  CT head demonstrates areas of possible vasogenic edema, concerning for possible brain metastases, unknown primary, will send for CT abdomen pelvis, MRI of the brain with and without contrast  Discussed sodium level with Dr. Cherylann Ratel of nephrology, recommends hypertonic saline at 25 mL/h  I discussed with hospitalist for admission, pending CT abdomen pelvis, MRI of the brain        FINAL CLINICAL IMPRESSION(S) / ED DIAGNOSES   Final diagnoses:  Acute hyponatremia  Vasogenic edema (HCC)  Pneumonia of left lower lobe due to infectious organism     Rx / DC Orders   ED Discharge Orders     None        Note:  This document was prepared using Dragon voice recognition software and may include unintentional dictation errors.   Jene Every, MD 03/05/23 [phone]

## 2023-03-05 NOTE — Assessment & Plan Note (Signed)
-   Bladder scans every shift - Continue home finasteride and tamsulosin - In and out as needed for bladder scan over 300 cc

## 2023-03-05 NOTE — Assessment & Plan Note (Signed)
History of COPD with continued tobacco use.  Patient endorses shortness of breath and increased cough.  Most likely secondary to pleural effusions, however cannot rule out COPD exacerbation with infectious etiology at this time.  - Continuous pulse oximetry - Continue Decadron - Continue Ceftriaxone and azithromycin - DuoNebs every 6 hours

## 2023-03-05 NOTE — Assessment & Plan Note (Addendum)
Per chart review, patient has a history of HFpEF previously treated with Entresto, carvedilol.  Last dispense was in 2023 with last follow-up with cardiology in 2019.  At this time, patient has bilateral pleural effusions and large pericardial effusion, however otherwise no peripheral edema.  - Echocardiogram ordered - Daily weights - Strict in and out

## 2023-03-05 NOTE — Assessment & Plan Note (Addendum)
Severe hyponatremia with sodium of 117.  Differential includes SIADH in the setting of brain metastasis versus beer [place]oto mania as patient drinks over 120 ounces of beer per day.  Nephrology has been consulted with recommendation for 3% saline.  - Nephrology consulted; appreciate their recommendations - 3% saline at 25 cc/h - Sodium checks every 4 hours - Urine studies pending

## 2023-03-05 NOTE — Consult Note (Signed)
Hematology/Oncology Consult note [phone] [phone]      Patient Care Team: Orson Eva, NP as PCP - General (Nurse Practitioner)   Name of the patient: Cody [name]  [id number]  07/14/1946   REASON FOR COSULTATION:   History of presenting illness-  77 y.o. male with PMH listed at below who presents to ER for evaluation of altered mental status.  Family has found patient has experienced increased shortness of breath with productive cough, increased weakness.   In the emergency room, a chest x-ray showed cardiomegaly with diffuse interstitial edema pattern.  Left lower lobe airspace consolidation concerning for superimposed pneumonia.  Suspect effusion. 03/05/2023, CT chest abdomen pelvis with contrast showed large pericardial effusion.  Moderate right and small left effusion with some loculated components on the left.  Adjacent parenchymal opacities.  Atelectasis versus infiltrates.  Few borderline enlarged lymph nodes including mediastinum, upper retroperitoneum.  Enlarged prostate with mass effect along the bladder with bladder wall thickening.  No bowel obstruction or free air.  Small area of wall thickening along the colon.  03/05/2023, CT head showed abdominal vasogenic edema in the right posterior frontal region and in the left occipital lobe worrisome for presence of occult mass lesions.  Questionable mild anemia and mass effect in the inferior right cerebellum.  No visible hemorrhage.  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  Atherosclerotic calcification of the major vessels at the base of brain.  03/05/2023, MRI brain with and without contrast showed multiple contrast-enhancing lesions in the right frontal lobe, left occipital lobe, superior right cerebellum with surrounding vasogenic edema.  Worrisome for intracranial metastatic disease.  Small dural based contrast-enhancing lesions along the left frontal convexity could represent a small hemangioma.   Metastatic disease is not excluded.  Lab work showed a sodium of 117, creatinine 1.18, he had a baseline  sodium of 142 months ago. LDH is elevated 694, WBC 13.2, hemoglobin 10.9, MCV 93.4, platelet count 257, BNP 99.4  Hematology oncology was consulted for further evaluation.  Patient speaks [place]  online interpreter service was utilized during this encounter.  Patient smokes daily for 57 years.  He also drinks alcohol daily.  Patient lives at home with wife.  He is retired.  He has 11 children with the youngest daughter 70 years old.  He reports that he has lost about 5 pounds.  He has experienced lower back/hip pain recently which has improved.  Denies any swallowing difficulties, fever, chills, night sweats.  Allergies  Allergen Reactions   Shellfish Allergy Hives    Only allergic to crabs    Patient Active Problem List   Diagnosis Date Noted   Acute encephalopathy 03/05/2023   Hyponatremia 03/05/2023   Pericardial effusion 03/05/2023   Vasogenic edema (HCC) 03/05/2023   Brain mass 03/05/2023   Pleural effusion, bilateral 03/05/2023   Alcohol use disorder 03/05/2023   BPH (benign prostatic hyperplasia) 03/05/2023   Elevated LFTs 03/05/2023   Congestive heart failure (HCC) 12/24/2022   Chronic obstructive pulmonary disease (HCC) 12/24/2022   Mixed hyperlipidemia 12/24/2022   Nicotine dependence with nicotine-induced disorder 12/24/2022   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   Unstable angina (HCC) 08/13/2018   Syncope 07/27/2018     Past Medical History:  Diagnosis Date   Alcohol dependence (HCC)    CHF (congestive heart failure) (HCC)    Hypertension    Tobacco abuse      Past Surgical History:  Procedure Laterality Date   APPENDECTOMY     LEFT HEART  CATH AND CORONARY ANGIOGRAPHY N/A 08/25/2018   Procedure: LEFT HEART CATH AND CORONARY ANGIOGRAPHY;  Surgeon: Laurier Nancy, MD;  Location: [facility] INVASIVE CV LAB;  Service: Cardiovascular;  Laterality: N/A;     Social History   Socioeconomic History   Marital status: Married    Spouse name: Not on file   Number of children: Not on file   Years of education: Not on file   Highest education level: Not on file  Occupational History   Not on file  Tobacco Use   Smoking status: Every Day    Packs/day: .5    Types: Cigarettes    Passive exposure: Current   Smokeless tobacco: Never  Vaping Use   Vaping Use: Never used  Substance and Sexual Activity   Alcohol use: Yes    Alcohol/week: 35.0 standard drinks of alcohol    Types: 35 Cans of beer per week   Drug use: Never   Sexual activity: Not Currently    Birth control/protection: None  Other Topics Concern   Not on file  Social History Narrative   Not on file   Social Determinants of Health   Financial Resource Strain: Low Risk  (08/25/2018)   Overall Financial Resource Strain (CARDIA)    Difficulty of Paying Living Expenses: Not very hard  Food Insecurity: Not on file  Transportation Needs: Unknown (08/25/2018)   PRAPARE - Transportation    Lack of Transportation (Medical): No    Lack of Transportation (Non-Medical): Not on file  Physical Activity: Not on file  Stress: No Stress Concern Present (08/25/2018)   [employer] of Occupational Health - Occupational Stress Questionnaire    Feeling of Stress : Only a little  Social Connections: Not on file  Intimate Partner Violence: Not on file     No family history on file.   Current Facility-Administered Medications:    acetaminophen (TYLENOL) tablet 650 mg, 650 mg, Oral, Q6H PRN **OR** acetaminophen (TYLENOL) suppository 650 mg, 650 mg, Rectal, Q6H PRN, Verdene Lennert, MD   [START ON 03/06/2023] azithromycin (ZITHROMAX) 500 mg in sodium chloride 0.9 % 250 mL IVPB, 500 mg, Intravenous, Q24H, Verdene Lennert, MD   [START ON 03/06/2023] cefTRIAXone (ROCEPHIN) 2 g in sodium chloride 0.9 % 100 mL IVPB, 2 g, Intravenous, Q24H, Verdene Lennert, MD   [START ON 03/06/2023]  dexamethasone (DECADRON) tablet 4 mg, 4 mg, Oral, Daily, Verdene Lennert, MD   folic acid (FOLVITE) tablet 1 mg, 1 mg, Oral, Daily, Verdene Lennert, MD, 1 mg at 03/05/23 1648   levETIRAcetam (KEPPRA) IVPB 500 mg/100 mL premix, 500 mg, Intravenous, Q12H, Verdene Lennert, MD, Stopped at 03/05/23 1807   LORazepam (ATIVAN) tablet 1-4 mg, 1-4 mg, Oral, Q1H PRN **OR** LORazepam (ATIVAN) injection 1-4 mg, 1-4 mg, Intravenous, Q1H PRN, Verdene Lennert, MD   multivitamin with minerals tablet 1 tablet, 1 tablet, Oral, Daily, Verdene Lennert, MD, 1 tablet at 03/05/23 1648   ondansetron (ZOFRAN) tablet 4 mg, 4 mg, Oral, Q6H PRN **OR** ondansetron (ZOFRAN) injection 4 mg, 4 mg, Intravenous, Q6H PRN, Verdene Lennert, MD   sodium chloride (hypertonic) 3 % solution, , Intravenous, Continuous, Jene Every, MD, Last Rate: 25 mL/hr at 03/05/23 1452, New Bag at 03/05/23 1452   sodium chloride flush (NS) 0.9 % injection 3 mL, 3 mL, Intravenous, Q12H, Verdene Lennert, MD   tamsulosin (FLOMAX) capsule 0.4 mg, 0.4 mg, Oral, Daily, Verdene Lennert, MD, 0.4 mg at 03/05/23 1737   thiamine (VITAMIN B1) tablet 100 mg, 100 mg, Oral, Daily, 100  mg at 03/05/23 1648 **OR** thiamine (VITAMIN B1) injection 100 mg, 100 mg, Intravenous, Daily, Verdene Lennert, MD  Current Outpatient Medications:    tamsulosin (FLOMAX) 0.4 MG CAPS capsule, Take 1 capsule (0.4 mg total) by mouth daily., Disp: 90 capsule, Rfl: 3   allopurinol (ZYLOPRIM) 100 MG tablet, Take 100 mg by mouth daily. (Patient not taking: Reported on 12/24/2022), Disp: , Rfl:    aspirin EC 81 MG tablet, Take 81 mg by mouth daily. (Patient not taking: Reported on 03/05/2023), Disp: , Rfl:    atorvastatin (LIPITOR) 80 MG tablet, Take 1 tablet by mouth every evening. (Patient not taking: Reported on 03/05/2023), Disp: , Rfl: 5   carvedilol (COREG) 25 MG tablet, Take 25 mg by mouth 2 (two) times daily. (Patient not taking: Reported on 03/05/2023), Disp: , Rfl: 3   colchicine 0.6 MG  tablet, Take by mouth., Disp: , Rfl:    ENTRESTO 24-26 MG, Take 1 tablet by mouth 2 (two) times daily. (Patient not taking: Reported on 03/05/2023), Disp: , Rfl: 5   ezetimibe (ZETIA) 10 MG tablet, Take 10 mg by mouth daily. (Patient not taking: Reported on 03/05/2023), Disp: , Rfl:    finasteride (PROSCAR) 5 MG tablet, Take 1 tablet (5 mg total) by mouth daily. (Patient not taking: Reported on 03/05/2023), Disp: 90 tablet, Rfl: 3  Review of Systems  Constitutional:  Positive for fatigue and unexpected weight change. Negative for chills and fever.  HENT:   Negative for hearing loss and voice change.   Eyes:  Negative for eye problems and icterus.  Respiratory:  Positive for cough and shortness of breath. Negative for chest tightness.   Cardiovascular:  Negative for chest pain and leg swelling.  Gastrointestinal:  Negative for abdominal distention, abdominal pain, blood in stool, nausea and vomiting.  Endocrine: Negative for hot flashes.  Genitourinary:  Negative for difficulty urinating, dysuria and frequency.   Musculoskeletal:  Positive for back pain. Negative for arthralgias.  Skin:  Negative for itching and rash.  Neurological:  Negative for light-headedness and numbness.  Hematological:  Negative for adenopathy. Does not bruise/bleed easily.  Psychiatric/Behavioral:  Positive for confusion.     PHYSICAL EXAM Vitals:   03/05/23 1109 03/05/23 1233 03/05/23 1327  BP: 123/76 126/80 138/78  Pulse: 96 90 87  Resp: 17 16 (!) 27  Temp: 98.4 F (36.9 C)  (!) 97.5 F (36.4 C)  TempSrc: Axillary  Oral  SpO2: 98% 95% 95%  Weight: 100 lb (45.4 kg)     Physical Exam Constitutional:      General: He is not in acute distress. HENT:     Head: Normocephalic and atraumatic.     Nose: Nose normal.     Mouth/Throat:     Pharynx: No oropharyngeal exudate.  Eyes:     General: No scleral icterus.    Pupils: Pupils are equal, round, and reactive to light.  Cardiovascular:     Rate and Rhythm:  Normal rate.  Pulmonary:     Effort: Pulmonary effort is normal. No respiratory distress.     Breath sounds: Wheezing and rhonchi present.     Comments: Decreased breath sound bilaterally Abdominal:     General: There is no distension.     Palpations: Abdomen is soft.     Tenderness: There is no abdominal tenderness.  Musculoskeletal:        General: Normal range of motion.     Cervical back: Normal range of motion.  Skin:    General:  Skin is warm and dry.     Findings: No erythema.  Neurological:     General: No focal deficit present.     Mental Status: He is alert.     Cranial Nerves: No cranial nerve deficit.     Motor: No abnormal muscle tone.     Comments: Lethargic, arousable for questions  Psychiatric:        Mood and Affect: Affect normal.       LABORATORY STUDIES    Latest Ref Rng & Units 03/05/2023   11:12 AM 12/19/2022    8:41 AM 11/06/2021    5:35 PM  CBC  WBC 4.0 - 10.5 K/uL 13.2  7.4  7.8   Hemoglobin 13.0 - 17.0 g/dL 16.1  09.6  04.5   Hematocrit 39.0 - 52.0 % 32.8  43.8  35.9   Platelets 150 - 400 K/uL 357  292  284       Latest Ref Rng & Units 03/05/2023    4:56 PM 03/05/2023   11:12 AM 12/19/2022    8:41 AM  CMP  Glucose 70 - 99 mg/dL  409  79   BUN 8 - 23 mg/dL  39  11   Creatinine 8.11 - 1.24 mg/dL  9.14  7.82   Sodium 956 - 145 mmol/L 118  117  140   Potassium 3.5 - 5.1 mmol/L  4.9  5.1   Chloride 98 - 111 mmol/L  87  104   CO2 22 - 32 mmol/L  18  23   Calcium 8.9 - 10.3 mg/dL  8.5  9.3   Total Protein 6.5 - 8.1 g/dL  7.5  6.7   Total Bilirubin 0.3 - 1.2 mg/dL  1.0  0.4   Alkaline Phos 38 - 126 U/L  84  62   AST 15 - 41 U/L  333  22   ALT 0 - 44 U/L  214  11      RADIOGRAPHIC STUDIES: I have personally reviewed the radiological images as listed and agreed with the findings in the report. MR Brain W and Wo Contrast  Result Date: 03/05/2023 CLINICAL DATA:  Mental status change, unknown cause EXAM: MRI HEAD WITHOUT AND WITH CONTRAST TECHNIQUE:  Multiplanar, multiecho pulse sequences of the brain and surrounding structures were obtained without and with intravenous contrast. CONTRAST:  5mL GADAVIST GADOBUTROL 1 MMOL/ML IV SOLN COMPARISON:  Same day CT head FINDINGS: Brain: Negative for an acute infarct. No hydrocephalus. No extra-axial fluid collection. Small microhemorrhage in the right lentiform nucleus. Chronic left cerebellar infarct. There is sequela mild-to-moderate chronic microvascular ischemic change. There are multiple lesions, including- -1.0 x 0.7 cm contrast-enhancing lesion in the right frontal lobe with surrounding vasogenic edema (series 18, image 108) - 0.8 x 0.7 cm contrast-enhancing lesions in in the left occipital lobe with surrounding vasogenic edema (series 18, image 69). -1.2 x 1.1 cm contrast-enhancing lesion in the superior right cerebellum mild surrounding vasogenic edema (series 18, image 50) -0.6 x 0.4 cm dural-based contrast-enhancing lesion along the left frontal convexity (series 93, image 176). Vascular: Normal flow voids. Skull and upper cervical spine: Normal marrow signal. Sinuses/Orbits: No middle ear or mastoid effusion. Mild mucosal thickening bilateral ethmoid air cells. Orbits are unremarkable. Other: None. IMPRESSION: 1. Multiple contrast-enhancing lesions in the right frontal lobe, left occipital lobe, and superior right cerebellum with surrounding vasogenic edema. 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 2. Small dural-based contrast-enhancing lesion along the left frontal convexity could represent a small  meningimoa, but metastatic disease is not excluded. Electronically Signed   By: Lorenza Cambridge M.D.   On: 03/05/2023 16:48   CT CHEST ABDOMEN PELVIS W CONTRAST  Result Date: 03/05/2023 CLINICAL DATA:  Brain mass. Assess for occult malignancy. * Tracking Code: BO * EXAM: CT CHEST, ABDOMEN, AND PELVIS WITH CONTRAST TECHNIQUE: Multidetector CT imaging of the chest, abdomen and pelvis was  performed following the standard protocol during bolus administration of intravenous contrast. RADIATION DOSE REDUCTION: This exam was performed according to the departmental dose-optimization program which includes automated exposure control, adjustment of the mA and/or kV according to patient size and/or use of iterative reconstruction technique. CONTRAST:  75mL OMNIPAQUE IOHEXOL 300 MG/ML  SOLN COMPARISON:  Chest CT without contrast 04/28/2015. X-ray chest earlier 03/05/2023 FINDINGS: CT CHEST FINDINGS Cardiovascular: Very large pericardial effusion. The heart itself is nonenlarged. Significant coronary artery calcifications. The thoracic aorta has a normal course and caliber with mild atherosclerotic calcified plaque. Mediastinum/Nodes: Normal caliber thoracic esophagus. Preserved thyroid gland. No specific abnormal lymph node enlargement seen including in the axillary regions, hilum. Prominent right paratracheal node on series 2, image 18 measures 18 by 7 mm. This could be 2 adjacent nodes. Subcarinal node series 2, image 29 measures 2.7 by 1.1 cm, mildly enlarged. Lungs/Pleura: Moderate right and small left effusion. There are some loculated components on the left with fluid tracking along the interlobar fissure. Adjacent parenchymal opacities. Atelectasis versus infiltrate. There is a opacity specifically in the lingula as well. Scattered ground-glass elsewhere in the visualized lungs. No pneumothorax. No obvious mass. Significant breathing motion throughout the examination. A subtle mass lesion could be obscured by the lung opacity in the effusions. Follow up study could be performed after improvement of the effusions. Musculoskeletal: Mild degenerative changes along the spine. CT ABDOMEN PELVIS FINDINGS Hepatobiliary: Contrast exam in the abdomen is more in the late arterial phase and portal venous phase. Patent portal vein. Gallbladder is present. Small cysts seen in segment 4 of the liver. Pancreas:  Unremarkable. No pancreatic ductal dilatation or surrounding inflammatory changes. Spleen: Normal in size without focal abnormality. Adrenals/Urinary Tract: Slight nonspecific thickening of the adrenal glands but unchanged from previous. No enhancing renal mass or collecting system dilatation. There are some small low-attenuation lesions in the left kidney which are too small to completely characterize but likely benign cysts, Bosniak 2 lesions. No specific imaging follow-up. The ureters have normal course and caliber extending down to the bladder. Diffuse wall thickening of the urinary bladder. Stomach/Bowel: On this non oral contrast exam, large bowel has a normal caliber. Redundant course of the sigmoid colon. There are some areas of mild wall thickening along loops of bowel, nonspecific. The stomach is mildly distended with fluid. Mild fold thickening suggested small bowel is nondilated. Vascular/Lymphatic: Normal caliber aorta and IVC with moderate calcified plaque along the aorta and iliac vessels. There are several enlarged nodes identified in the retroperitoneum and upper abdomen. Example aortocaval node posteriorly on series 2, image 64 measures 17 by 13 mm. No next of the celiac series 2, image 59 measures 16 by 14 mm. Left para-node anteriorly on series 2, image 68 measures 17 by 8 mm. A few small retrocrural nodes are also identified. Reproductive: Enlarged heterogeneous prostate. Please correlate with patient's PSA. Other: Anasarca.  Mesenteric haziness.  Trace ascites.  No free air Musculoskeletal: Mild degenerative changes along the spine. IMPRESSION: Large pericardial effusion. Please correlate with and new symptoms including for tamponade. Moderate right and small left effusion with  some loculated components on the left. Adjacent parenchymal opacities. Atelectasis versus infiltrate. Recommend follow up after clearance of the effusions. Few borderline enlarged lymph nodes identified including  mediastinum, upper retroperitoneum. Enlarged prostate with mass effect along the bladder with bladder wall thickening. Please correlate with patient's PSA. No bowel obstruction or free air. There is scattered mesenteric haziness with trace ascites. There are some small areas of wall thickening along the colon but this could simply relate to the other signs of edema. Electronically Signed   By: Karen Kays M.D.   On: 03/05/2023 15:03   DG Chest Port 1 View  Result Date: 03/05/2023 CLINICAL DATA:  Shortness of breath EXAM: PORTABLE CHEST 1 VIEW COMPARISON:  07/27/2018 FINDINGS: Cardiac silhouette is enlarged with diffuse interstitial opacities throughout both lungs compatible with edema related to CHF. Basilar atelectasis noted, worse on the left. Difficult to exclude small effusions. Also left lower lobe retrocardiac opacity obscures the left hemidiaphragm concerning for possible superimposed pneumonia. No pneumothorax. Trachea midline. Aorta atherosclerotic. No acute osseous finding. IMPRESSION: 1. Cardiomegaly with diffuse interstitial edema pattern. 2. Left lower lobe airspace consolidation concerning for superimposed pneumonia. 3. Suspect small effusions. Electronically Signed   By: Judie Petit.  Shick M.D.   On: 03/05/2023 13:20   CT Head Wo Contrast  Result Date: 03/05/2023 CLINICAL DATA:  Mental status change of unknown cause. Memory disturbance. EXAM: CT HEAD WITHOUT CONTRAST TECHNIQUE: Contiguous axial images were obtained from the base of the skull through the vertex without intravenous contrast. RADIATION DOSE REDUCTION: This exam was performed according to the departmental dose-optimization program which includes automated exposure control, adjustment of the mA and/or kV according to patient size and/or use of iterative reconstruction technique. COMPARISON:  07/27/2018 FINDINGS: Brain: No focal abnormality affects the brainstem. Question mild edema and mass effect in the inferior right cerebellum. Old small  vessel infarction of the left cerebellum. There is abnormal vasogenic edema within the right posterior frontal region worrisome for an underlying mass lesion. In the left hemisphere, there is abnormal vasogenic edema in the occipital lobe worrisome for the presence of an occult mass lesion. No visible hemorrhage. Hydrocephalus or extra-axial collection. Vascular: There is atherosclerotic calcification of the major vessels at the base of the brain. Skull: No calvarial abnormality. Sinuses/Orbits: Clear/normal Other: None IMPRESSION: 1. Abnormal vasogenic edema in the right posterior frontal region and in the left occipital lobe worrisome for the presence of occult mass lesions. Question mild edema and mass effect in the inferior right cerebellum. No visible hemorrhage. MRI with and without contrast is recommended for further evaluation. 2. 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 3. Atherosclerotic calcification of the major vessels at the base of the brain. Electronically Signed   By: Paulina Fusi M.D.   On: 03/05/2023 12:52     Assessment and plan-   # Multiple brain mass with vasogenic edema. Neurosurgery consultation.   Agree with steroid treatments. S/p Decadron 8 mg IV once, Start Decadron 4 mg daily Keppra  # Pleural effusion, Mediastinal and retroperitoneal lymphadenopathy Recommend therapeutic and diagnostic thoracentesis, pending fluid analysis including cytology, cell count, culture, protein, glucose, and LDH Consider repeat CT imaging after thoracentesis for evaluation of lung parenchyma, rule out potential lung lesion which were obscured due to atelectasis from pleural effusion.  LDH is elevated in the 600s.  Indicating aggressive process.  Check uric acid. Patient has longstanding smoking and drinking history.  Differential diagnosis include primary lung cancer, Differential diagnosis include primary lung malignancy, lymphoma, other  solid organ malignancy. COPD exacerbation,  with empiric antibiotics coverage.  # Pericardial effusion, no signs of tamponade.  Check echocardiogram # Hyponatremia, acute, serum osmolarity 272, pending urine sodium, urine osmolarity. BNP normal.  Check TSH, cortisol level Blood pressure nephrology input.  3% saline started, every 4 hours sodium checks. # Transaminitis, could be secondary to chronic alcohol use.  Check acute hepatitis panel, PT, PTT. CIWA protocol   Updated patient's son-in-law who is at the bedside. Thank you for allowing me to participate in the care of this patient.   Rickard Patience, MD, PhD Hematology Oncology 03/05/2023

## 2023-03-05 NOTE — Consult Note (Signed)
MEDICATION RELATED CONSULT NOTE  Pharmacy Consult for hypertonic saline monitoring Indication: hyponatremia  Allergies  Allergen Reactions   Shellfish Allergy Hives    Only allergic to crabs    Patient Measurements: Weight: 45.4 kg (100 lb)   Vital Signs: Temp: 97.5 F (36.4 C) (05/07 1327) Temp Source: Oral (05/07 1327) BP: 138/78 (05/07 1327) Pulse Rate: 87 (05/07 1327) Intake/Output from previous day: No intake/output data recorded. Intake/Output from this shift: No intake/output data recorded.  Labs: Recent Labs    03/05/23 1112  WBC 13.2*  HGB 10.9*  HCT 32.8*  PLT 357  CREATININE 1.18  ALBUMIN 3.0*  PROT 7.5  AST 333*  ALT 214*  ALKPHOS 84  BILITOT 1.0   Estimated Creatinine Clearance: 33 mL/min (by C-G formula based on SCr of 1.18 mg/dL).   Medical History: Past Medical History:  Diagnosis Date   Alcohol dependence (HCC)    CHF (congestive heart failure) (HCC)    Hypertension    Tobacco abuse     Assessment: 77 yo male brought to the emergency department by family due to memory loss, falls, and change in mental status.  Sodium found to be low at 117 in the ED.  Provider has order hypertonic saline infusion for correction.  Goal of Therapy:  Goal for acute hyponatremia: Increase Na by 4-6 mEq/L in 4-6 hours Goal for 24 hour correction generally no more than 8-10 mEq/L  Na Levels:  05/07 1112 Na 117 05/07 1422 NaCl 3% @ 25 ml/hr started  Plan:  Provider order NaCl 3% @ 25 ml/hr Monitor Na q2h x 2 occurrences, then q4h Call RN to stop infusion and notify MD if   Na increases by 4 mEq/L or more in the first 2 hours  Or  Na increases by 6 mEq/L or more in the first 4 hours  Barrie Folk, PharmD 03/05/2023,2:05 PM

## 2023-03-05 NOTE — Assessment & Plan Note (Signed)
CT imaging with 2 mass lesions with vasogenic edema.  - Discussed case with neurology.  Potential consultation pending MRI results - Neurosurgery consultation - S/p Decadron 8 mg IV once - Start Decadron 4 mg daily tomorrow

## 2023-03-05 NOTE — Consult Note (Signed)
MEDICATION RELATED CONSULT NOTE  Pharmacy Consult for hypertonic saline monitoring Indication: hyponatremia  Allergies  Allergen Reactions   Shellfish Allergy Hives    Only allergic to crabs    Patient Measurements: Weight: 45.4 kg (100 lb)   Vital Signs: Temp: 97.5 F (36.4 C) (05/07 1327) Temp Source: Oral (05/07 1327) BP: 138/78 (05/07 1327) Pulse Rate: 87 (05/07 1327) Intake/Output from previous day: No intake/output data recorded. Intake/Output from this shift: Total I/O In: 250 [IV Piggyback:250] Out: -   Labs: Recent Labs    03/05/23 1112  WBC 13.2*  HGB 10.9*  HCT 32.8*  PLT 357  CREATININE 1.18  ALBUMIN 3.0*  PROT 7.5  AST 333*  ALT 214*  ALKPHOS 84  BILITOT 1.0    Estimated Creatinine Clearance: 33 mL/min (by C-G formula based on SCr of 1.18 mg/dL).   Medical History: Past Medical History:  Diagnosis Date   Alcohol dependence (HCC)    CHF (congestive heart failure) (HCC)    Hypertension    Tobacco abuse     Assessment: 77 yo male brought to the emergency department by family due to memory loss, falls, and change in mental status.  Sodium found to be low at 117 in the ED.  Provider has order hypertonic saline infusion for correction.  Goal of Therapy:  Goal for acute hyponatremia: Increase Na by 4-6 mEq/L in 4-6 hours Goal for 24 hour correction generally no more than 8-10 mEq/L  Na Levels:  05/07 1112 Na 117 05/07 1422 NaCl 3% @ 25 ml/hr started 05/07 1656 Na 118  Plan:  Increase within acceptable range, next check 5/7 @ 1830 Provider order NaCl 3% @ 25 ml/hr Monitor Na q2h x 2 occurrences, then q4h Call RN to stop infusion and notify MD if   Na increases by 4 mEq/L or more in the first 2 hours  Or  Na increases by 6 mEq/L or more in the first 4 hours  Barrie Folk, PharmD 03/05/2023,5:53 PM

## 2023-03-05 NOTE — ED Notes (Signed)
Placed pt on monitoring in hall-will move to room as there is availability

## 2023-03-05 NOTE — Assessment & Plan Note (Signed)
-   Bladder scans every shift - Continue home finasteride and tamsulosin - In and out as needed for bladder scan over 300 cc 

## 2023-03-05 NOTE — Assessment & Plan Note (Signed)
Long-term history of alcohol use disorder that is currently active.  No evidence of withdrawal on exam at this time.  - CIWA protocol with Ativan as needed - TOC consultation for resources - Daily folic acid, thiamine and multivitamin

## 2023-03-05 NOTE — H&P (Addendum)
History and Physical    Patient: Cody [name] WUJ:[id number] DOB: 1946-02-12 DOA: 03/05/2023 DOS: the patient was seen and examined on 03/05/2023 PCP: Orson Eva, NP  Patient coming from: Home  Chief Complaint:  Chief Complaint  Patient presents with   Memory Loss   HPI: Cody [name] is a 77 y.o. male with medical history significant of hypertension, HFpEF, alcohol use disorder, BPH, who presents to the ED due to altered mental status.  History obtained from both patient and family at bedside.  Patient request that his family translate.  Patient's son states that for the last 1 week, they have noticed that Cody [name] has been experiencing increased shortness of breath with productive cough.  Shortness of breath occurs both at rest and with exertion. He has experienced palpitations but denies any shortness of breathe. No lower extremity swelling. In addition, he has been generally weak, however no focal weakness  Then for the last 2 to 3-days, they noticed that he has been speaking nonsensically and seems to have difficulty with his memory. Two days ago, patient felt feverish but no chills, nausea, vomiting or abdominal pain.  Patient's son states that patient has been drinking alcohol daily for a long time now.  He drinks approximately 10 beers that are 12 ounces per day at the minimum.  In addition, he smokes daily.   ED course: On arrival to the ED, patient was normotensive at 123/76 with heart rate of 96.  He was saturating at 98% on room air.  He was afebrile at 98.4.  Initial workup notable for sodium of 117, potassium 4.9, bicarb 18, glucose 154, BUN 39, creatinine 1.18, AST 333, ALT 214, WBC of 13.2, hemoglobin 10.9 and platelets at 357. BNP within normal limits at 99.  CT of the head was obtained that demonstrated abnormal vasogenic edema in the right posterior frontal region and left occipital lobe worrisome for mass lesions.  Chest x-ray was obtained that demonstrated cardiomegaly with diffuse  interstitial edema and left lower lobe airspace consolidation.  Nephrology was consulted and recommended initiation of 3% saline.  Patient started on azithromycin and ceftriaxone for suspected pneumonia.  MRI of the brain and CT chest/abdomen/pelvis ordered.  [facility] contacted for admission.  Review of Systems: unable to review all systems due to the inability of the patient to answer questions.  Past Medical History:  Diagnosis Date   Alcohol dependence (HCC)    CHF (congestive heart failure) (HCC)    Hypertension    Tobacco abuse    Past Surgical History:  Procedure Laterality Date   APPENDECTOMY     LEFT HEART CATH AND CORONARY ANGIOGRAPHY N/A 08/25/2018   Procedure: LEFT HEART CATH AND CORONARY ANGIOGRAPHY;  Surgeon: Laurier Nancy, MD;  Location: [facility] INV[facility]IVE [facility];  Service: Cardiovascular;  Laterality: N/A;   Social History:  reports that he has been smoking cigarettes. He has been smoking an average of .5 packs per day. He has been exposed to tobacco smoke. He has never used smokeless tobacco. He reports current alcohol use of about 35.0 standard drinks of alcohol per week. He reports that he does not use drugs.  Allergies  Allergen Reactions   Shellfish Allergy Hives    Only allergic to crabs    No family history on file.  Prior to Admission medications   Medication Sig Start Date End Date Taking? Authorizing Provider  allopurinol (ZYLOPRIM) 100 MG tablet Take 100 mg by mouth daily. Patient not taking: Reported on 12/24/2022 10/17/21  [provider]  aspirin EC 81 MG tablet Take 81 mg by mouth daily.    [provider]  atorvastatin (LIPITOR) 80 MG tablet Take 1 tablet by mouth every evening. 06/26/18   [provider]  carvedilol (COREG) 25 MG tablet Take 25 mg by mouth 2 (two) times daily. 06/26/18   [provider]  colchicine 0.6 MG tablet Take by mouth. 06/05/22 09/03/22  [provider]  ENTRESTO 24-26 MG Take 1 tablet by mouth  2 (two) times daily. 06/26/18   [provider]  ezetimibe (ZETIA) 10 MG tablet Take 10 mg by mouth daily. 10/13/21   [provider]  finasteride (PROSCAR) 5 MG tablet Take 1 tablet (5 mg total) by mouth daily. 07/17/22   Sondra Come, MD  tamsulosin (FLOMAX) 0.4 MG CAPS capsule Take 1 capsule (0.4 mg total) by mouth daily. 07/17/22   Sondra Come, MD    Physical Exam: Vitals:   03/05/23 1109 03/05/23 1233 03/05/23 1327  BP: 123/76 126/80 138/78  Pulse: 96 90 87  Resp: 17 16 (!) 27  Temp: 98.4 F (36.9 C)  (!) 97.5 F (36.4 C)  TempSrc: Axillary  Oral  SpO2: 98% 95% 95%  Weight: 45.4 kg     Physical Exam Vitals and nursing note reviewed.  Constitutional:      General: He is not in acute distress.    Appearance: He is normal weight.  HENT:     Head: Normocephalic and atraumatic.     Mouth/Throat:     Mouth: Mucous membranes are moist.     Pharynx: Oropharynx is clear.     Comments: Poor dentition Eyes:     Extraocular Movements: Extraocular movements intact.     Conjunctiva/sclera: Conjunctivae normal.     Pupils: Pupils are equal, round, and reactive to light.  Cardiovascular:     Rate and Rhythm: Normal rate. Rhythm irregular.     Heart sounds: No murmur heard.    No gallop.     Comments: No rub Pulmonary:     Effort: No tachypnea, accessory muscle usage or respiratory distress.     Breath sounds: Decreased breath sounds (Bibasilar), wheezing (Intermittent and mild) and rhonchi (diffusely) present.  Abdominal:     General: Bowel sounds are normal. There is no distension.     Palpations: Abdomen is soft.     Tenderness: There is no abdominal tenderness. There is no guarding.  Musculoskeletal:     Right lower leg: No edema.     Left lower leg: No edema.  Skin:    General: Skin is warm and dry.     Findings: No erythema.  Neurological:     Mental Status: He is alert.     Comments:  Patient is alert to person, place and situation but not to  time 5 out of 5 strength throughout No facial asymmetry Normal bilateral finger-to-nose    Data Reviewed: CBC with WBC of 13.2, hemoglobin 10.9, MCV 93 and platelets of 357 CMP with sodium of 117, potassium 4.9, bicarb 18, glucose 154, BUN 39, creatinine 1.18, anion gap 12, AST 333, ALT 214 and GFR above 60 although weight-based creatinine clearance of 33. BNP within normal limits at 99  EKG personally reviewed.  Sinus rhythm with rate of 87.  T wave flattening noted throughout, however no other ischemic appearing changes.  CT CHEST ABDOMEN PELVIS W CONTRAST  Result Date: 03/05/2023 CLINICAL DATA:  Brain mass. Assess for occult malignancy. * Tracking  Code: BO * EXAM: CT CHEST, ABDOMEN, AND PELVIS WITH CONTRAST TECHNIQUE: Multidetector CT imaging of the chest, abdomen and pelvis was performed following the standard protocol during bolus administration of intravenous contrast. RADIATION DOSE REDUCTION: This exam was performed according to the departmental dose-optimization program which includes automated exposure control, adjustment of the mA and/or kV according to patient size and/or use of iterative reconstruction technique. CONTRAST:  75mL OMNIPAQUE IOHEXOL 300 MG/ML  SOLN COMPARISON:  Chest CT without contrast 04/28/2015. X-ray chest earlier 03/05/2023 FINDINGS: CT CHEST FINDINGS Cardiovascular: Very large pericardial effusion. The heart itself is nonenlarged. Significant coronary artery calcifications. The thoracic aorta has a normal course and caliber with mild atherosclerotic calcified plaque. Mediastinum/Nodes: Normal caliber thoracic esophagus. Preserved thyroid gland. No specific abnormal lymph node enlargement seen including in the axillary regions, hilum. Prominent right paratracheal node on series 2, image 18 measures 18 by 7 mm. This could be 2 adjacent nodes. Subcarinal node series 2, image 29 measures 2.7 by 1.1 cm, mildly enlarged. Lungs/Pleura: Moderate right and small left effusion.  There are some loculated components on the left with fluid tracking along the interlobar fissure. Adjacent parenchymal opacities. Atelectasis versus infiltrate. There is a opacity specifically in the lingula as well. Scattered ground-glass elsewhere in the visualized lungs. No pneumothorax. No obvious mass. Significant breathing motion throughout the examination. A subtle mass lesion could be obscured by the lung opacity in the effusions. Follow up study could be performed after improvement of the effusions. Musculoskeletal: Mild degenerative changes along the spine. CT ABDOMEN PELVIS FINDINGS Hepatobiliary: Contrast exam in the abdomen is more in the late arterial phase and portal venous phase. Patent portal vein. Gallbladder is present. Small cysts seen in segment 4 of the liver. Pancreas: Unremarkable. No pancreatic ductal dilatation or surrounding inflammatory changes. Spleen: Normal in size without focal abnormality. Adrenals/Urinary Tract: Slight nonspecific thickening of the adrenal glands but unchanged from previous. No enhancing renal mass or collecting system dilatation. There are some small low-attenuation lesions in the left kidney which are too small to completely characterize but likely benign cysts, Bosniak 2 lesions. No specific imaging follow-up. The ureters have normal course and caliber extending down to the bladder. Diffuse wall thickening of the urinary bladder. Stomach/Bowel: On this non oral contrast exam, large bowel has a normal caliber. Redundant course of the sigmoid colon. There are some areas of mild wall thickening along loops of bowel, nonspecific. The stomach is mildly distended with fluid. Mild fold thickening suggested small bowel is nondilated. Vascular/Lymphatic: Normal caliber aorta and IVC with moderate calcified plaque along the aorta and iliac vessels. There are several enlarged nodes identified in the retroperitoneum and upper abdomen. Example aortocaval node posteriorly on  series 2, image 64 measures 17 by 13 mm. No next of the celiac series 2, image 59 measures 16 by 14 mm. Left para-node anteriorly on series 2, image 68 measures 17 by 8 mm. A few small retrocrural nodes are also identified. Reproductive: Enlarged heterogeneous prostate. Please correlate with patient's PSA. Other: Anasarca.  Mesenteric haziness.  Trace ascites.  No free air Musculoskeletal: Mild degenerative changes along the spine. IMPRESSION: Large pericardial effusion. Please correlate with and new symptoms including for tamponade. Moderate right and small left effusion with some loculated components on the left. Adjacent parenchymal opacities. Atelectasis versus infiltrate. Recommend follow up after clearance of the effusions. Few borderline enlarged lymph nodes identified including mediastinum, upper retroperitoneum. Enlarged prostate with mass effect along the bladder with bladder wall thickening. Please correlate  with patient's PSA. No bowel obstruction or free air. There is scattered mesenteric haziness with trace ascites. There are some small areas of wall thickening along the colon but this could simply relate to the other signs of edema. Electronically Signed   By: Karen Kays M.D.   On: 03/05/2023 15:03   DG Chest Port 1 View  Result Date: 03/05/2023 CLINICAL DATA:  Shortness of breath EXAM: PORTABLE CHEST 1 VIEW COMPARISON:  07/27/2018 FINDINGS: Cardiac silhouette is enlarged with diffuse interstitial opacities throughout both lungs compatible with edema related to CHF. Basilar atelectasis noted, worse on the left. Difficult to exclude small effusions. Also left lower lobe retrocardiac opacity obscures the left hemidiaphragm concerning for possible superimposed pneumonia. No pneumothorax. Trachea midline. Aorta atherosclerotic. No acute osseous finding. IMPRESSION: 1. Cardiomegaly with diffuse interstitial edema pattern. 2. Left lower lobe airspace consolidation concerning for superimposed pneumonia.  3. Suspect small effusions. Electronically Signed   By: Judie Petit.  Shick M.D.   On: 03/05/2023 13:20   CT Head Wo Contrast  Result Date: 03/05/2023 CLINICAL DATA:  Mental status change of unknown cause. Memory disturbance. EXAM: CT HEAD WITHOUT CONTRAST TECHNIQUE: Contiguous axial images were obtained from the base of the skull through the vertex without intravenous contrast. RADIATION DOSE REDUCTION: This exam was performed according to the departmental dose-optimization program which includes automated exposure control, adjustment of the mA and/or kV according to patient size and/or use of iterative reconstruction technique. COMPARISON:  07/27/2018 FINDINGS: Brain: No focal abnormality affects the brainstem. Question mild edema and mass effect in the inferior right cerebellum. 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 There is abnormal vasogenic edema within the right posterior frontal region worrisome for an underlying mass lesion. In the left hemisphere, there is abnormal vasogenic edema in the occipital lobe worrisome for the presence of an occult mass lesion. No visible hemorrhage. Hydrocephalus or extra-axial collection. Vascular: There is atherosclerotic calcification of the major vessels at the base of the brain. Skull: No calvarial abnormality. Sinuses/Orbits: Clear/normal Other: None IMPRESSION: 1. Abnormal vasogenic edema in the right posterior frontal region and in the left occipital lobe worrisome for the presence of occult mass lesions. Question mild edema and mass effect in the inferior right cerebellum. No visible hemorrhage. MRI with and without contrast is recommended for further evaluation. 2. 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 3. Atherosclerotic calcification of the major vessels at the base of the brain. Electronically Signed   By: Paulina Fusi M.D.   On: 03/05/2023 12:52    Results are pending, will review when available.  Assessment and Plan:  * Acute  encephalopathy Patient is presenting with 2-3-day history of confusion, speaking nonsensically and memory deficits per family at bedside.  CT imaging with evidence of 2 mass lesions with vasogenic edema.  Currently, mentation appears stable with no evidence of focal weakness.  Hyponatremia may also be contributing in addition to alcohol use disorder.  - MRI brain pending - Seizure precautions  Vasogenic edema (HCC) CT imaging with 2 mass lesions with vasogenic edema.  - Discussed case with neurology.  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  Pan scan demonstrated pericardial effusion and pleural effusions in addition to significant paratracheal and subcarinal lymphadenopathy.  Given shortness of breath, will start with a thoracentesis with sample sent for cytology.  Will also discuss biopsy with neurosurgery.  Lastly, will involve oncology for their input.  - Consult neurosurgery and oncology as noted above  Hyponatremia Severe hyponatremia with sodium of 117.  Differential includes SIADH in the setting of brain metastasis versus beer [place]oto mania as patient drinks over 120 ounces of beer per day.  Nephrology has been consulted with recommendation for 3% saline.  - Nephrology consulted; appreciate their recommendations - 3% saline at 25 cc/h - Sodium checks every 4 hours - Urine studies pending  Pericardial effusion Large pericardial effusion seen on CT imaging.  Patient has some JVD on exam, however no evidence of tamponade physiology.  He is hemodynamically stable with no tachycardia or hypotension.  I suspect that this has been a gradual chronic process with underlying malignancy as possible etiology  - Echocardiogram ordered  Pleural effusion, bilateral Bilateral pleural effusions noted on CT  imaging.  Given shortness of breath and need for sample, will order an ultrasound guided thoracentesis  - Ultrasound-guided thoracentesis ordered - Pleural fluid studies to include cytology, cell count, culture, protein, glucose, and LDH  Congestive heart failure (HCC) Per chart review, patient has a history of HFpEF previously treated with Entresto, carvedilol.  Last dispense was in 2023 with last follow-up with cardiology in 2019.  At this time, patient has bilateral pleural effusions and large pericardial effusion, however otherwise no peripheral edema.  - Echocardiogram ordered - Daily weights - Strict in and out  Chronic obstructive pulmonary disease (HCC) History of COPD with continued tobacco use.  Patient endorses shortness of breath and increased cough.  Most likely secondary to pleural effusions, however cannot rule out COPD exacerbation with infectious etiology at this time.  - Continuous pulse oximetry - Continue Decadron - Continue Ceftriaxone and azithromycin - DuoNebs every 6 hours  Alcohol use disorder Long-term history of alcohol use disorder that is currently active.  No evidence of withdrawal on exam at this time.  - CIWA protocol with Ativan as needed - TOC consultation for resources - Daily folic acid, thiamine and multivitamin  BPH (benign prostatic hyperplasia) - Bladder scans every shift - Continue home finasteride and tamsulosin - In and out as needed for bladder scan over 300 cc  Elevated LFTs Likely in the setting of active alcohol use disorder.  No prior diagnosis of cirrhosis, however there is evidence of ascites.  Platelets are normal.  Will check a PT/INR  - Daily CMP  Advance Care Planning:   Code Status: Full Code verified by patient's family at bedside  Consults: Nephrology  Family Communication: Patient's son, daughters, and wife updated at bedside  Severity of Illness: The appropriate patient status for this patient is INPATIENT.  Inpatient status is judged to be reasonable and necessary in order to provide the required intensity of service to ensure the patient's safety. The patient's presenting symptoms, physical exam findings, and initial radiographic and laboratory data in the context of their chronic comorbidities is felt to place them at high risk for further clinical deterioration. Furthermore, it is not anticipated that the patient will be medically stable for discharge from the hospital within 2 midnights of admission.   * I certify that at the point of admission it is my clinical judgment that the patient will require inpatient hospital care spanning beyond 2 midnights from the point of admission due to high intensity of service, high risk for further deterioration and high frequency of surveillance required.*  Author: Verdene Lennert, MD 03/05/2023 4:27 PM  For on  call review [email or web address].

## 2023-03-05 NOTE — Assessment & Plan Note (Signed)
Bilateral pleural effusions noted on CT imaging.  Given shortness of breath and need for sample, will order an ultrasound guided thoracentesis  - Ultrasound-guided thoracentesis ordered - Pleural fluid studies to include cytology, cell count, culture, protein, glucose, and LDH 

## 2023-03-05 NOTE — Consult Note (Signed)
MEDICATION RELATED CONSULT NOTE  Pharmacy Consult for hypertonic saline monitoring Indication: hyponatremia  Allergies  Allergen Reactions   Shellfish Allergy Hives    Only allergic to crabs    Patient Measurements: Weight: 45.4 kg (100 lb)   Vital Signs: Temp: 97.5 F (36.4 C) (05/07 1327) Temp Source: Oral (05/07 1327) BP: 121/90 (05/07 2222) Pulse Rate: 92 (05/07 2222) Intake/Output from previous day: No intake/output data recorded. Intake/Output from this shift: No intake/output data recorded.  Labs: Recent Labs    03/05/23 1112  WBC 13.2*  HGB 10.9*  HCT 32.8*  PLT 357  CREATININE 1.18  ALBUMIN 3.0*  PROT 7.5  AST 333*  ALT 214*  ALKPHOS 84  BILITOT 1.0    Estimated Creatinine Clearance: 33 mL/min (by C-G formula based on SCr of 1.18 mg/dL).   Medical History: Past Medical History:  Diagnosis Date   Alcohol dependence (HCC)    CHF (congestive heart failure) (HCC)    Hypertension    Tobacco abuse     Assessment: 77 yo male brought to the emergency department by family due to memory loss, falls, and change in mental status.  Sodium found to be low at 117 in the ED.  Provider has order hypertonic saline infusion for correction.  Goal of Therapy:  Goal for acute hyponatremia: Increase Na by 4-6 mEq/L in 4-6 hours Goal for 24 hour correction generally no more than 8-10 mEq/L  Na Levels:  05/07 1112 Na 117 05/07 1422 NaCl 3% @ 25 ml/hr started 05/07 1656 Na 118 05/07 2213 Na 123   Plan:  Increase within acceptable range, next check 5/8 @ 0200 Provider order NaCl 3% @ 25 ml/hr Monitor Na q2h x 2 occurrences, then q4h Call RN to stop infusion and notify MD if   Na increases by 4 mEq/L or more in the first 2 hours  Or  Na increases by 6 mEq/L or more in the first 4 hours  Juliya Magill D, PharmD 03/05/2023,10:57 PM

## 2023-03-05 NOTE — Assessment & Plan Note (Addendum)
Patient is presenting with 2-3-day history of confusion, speaking nonsensically and memory deficits per family at bedside.  CT imaging with evidence of 2 mass lesions with vasogenic edema.  Currently, mentation appears stable with no evidence of focal weakness.  Hyponatremia may also be contributing in addition to alcohol use disorder.  - MRI brain pending - Seizure precautions

## 2023-03-06 ENCOUNTER — Inpatient Hospital Stay: Payer: Medicare Other

## 2023-03-06 ENCOUNTER — Encounter: Payer: Self-pay | Admitting: Cardiology

## 2023-03-06 ENCOUNTER — Inpatient Hospital Stay
Admit: 2023-03-06 | Discharge: 2023-03-06 | Disposition: A | Discharge status: Home / Self Care | Payer: Medicare Other | Attending: Cardiology | Admitting: Cardiology

## 2023-03-06 ENCOUNTER — Encounter
Admission: EM | Disposition: A | Discharge status: Home Health | Payer: Self-pay | Source: Home / Self Care | Attending: Student

## 2023-03-06 DIAGNOSIS — Z7189 Other specified counseling: Secondary | ICD-10-CM | POA: Diagnosis not present

## 2023-03-06 DIAGNOSIS — I509 Heart failure, unspecified: Secondary | ICD-10-CM

## 2023-03-06 DIAGNOSIS — G9340 Encephalopathy, unspecified: Secondary | ICD-10-CM | POA: Diagnosis not present

## 2023-03-06 DIAGNOSIS — C7931 Secondary malignant neoplasm of brain: Secondary | ICD-10-CM | POA: Diagnosis not present

## 2023-03-06 HISTORY — PX: PERICARDIOCENTESIS: CATH118255

## 2023-03-06 LAB — ECHOCARDIOGRAM COMPLETE
AR max vel: 2.86 cm2
AV Area VTI: 2.84 cm2
AV Area mean vel: 2.6 cm2
AV Mean grad: 2.5 mmHg
AV Peak grad: 4.3 mmHg
Ao pk vel: 1.04 m/s
Area-P 1/2: 4.28 cm2
Height: 60 in
P 1/2 time: 507 msec
S' Lateral: 2.5 cm
S' Lateral: 3.1 cm
Weight: 1600 oz
Weight: 1587.31 oz

## 2023-03-06 LAB — CBC WITH DIFFERENTIAL/PLATELET
Abs Immature Granulocytes: 0.27 10*3/uL — ABNORMAL HIGH (ref 0.00–0.07)
Basophils Absolute: 0 10*3/uL (ref 0.0–0.1)
Basophils Relative: 0 %
Eosinophils Absolute: 0.1 10*3/uL (ref 0.0–0.5)
Eosinophils Relative: 1 %
HCT: 32.2 % — ABNORMAL LOW (ref 39.0–52.0)
Hemoglobin: 10.9 g/dL — ABNORMAL LOW (ref 13.0–17.0)
Immature Granulocytes: 2 %
Lymphocytes Relative: 10 %
Lymphs Abs: 1.2 10*3/uL (ref 0.7–4.0)
MCH: 31 pg (ref 26.0–34.0)
MCHC: 33.9 g/dL (ref 30.0–36.0)
MCV: 91.5 fL (ref 80.0–100.0)
Monocytes Absolute: 0.6 10*3/uL (ref 0.1–1.0)
Monocytes Relative: 5 %
Neutro Abs: 9.2 10*3/uL — ABNORMAL HIGH (ref 1.7–7.7)
Neutrophils Relative %: 82 %
Platelets: 309 10*3/uL (ref 150–400)
RBC: 3.52 MIL/uL — ABNORMAL LOW (ref 4.22–5.81)
RDW: 13.1 % (ref 11.5–15.5)
WBC: 11.3 10*3/uL — ABNORMAL HIGH (ref 4.0–10.5)
nRBC: 0.2 % (ref 0.0–0.2)

## 2023-03-06 LAB — COMPREHENSIVE METABOLIC PANEL
ALT: 217 U/L — ABNORMAL HIGH (ref 0–44)
AST: 254 U/L — ABNORMAL HIGH (ref 15–41)
Albumin: 2.7 g/dL — ABNORMAL LOW (ref 3.5–5.0)
Alkaline Phosphatase: 82 U/L (ref 38–126)
Anion gap: 12 (ref 5–15)
BUN: 42 mg/dL — ABNORMAL HIGH (ref 8–23)
CO2: 17 mmol/L — ABNORMAL LOW (ref 22–32)
Calcium: 8.5 mg/dL — ABNORMAL LOW (ref 8.9–10.3)
Chloride: 95 mmol/L — ABNORMAL LOW (ref 98–111)
Creatinine, Ser: 0.99 mg/dL (ref 0.61–1.24)
GFR, Estimated: >60 mL/min (ref >60)
Glucose, Bld: 129 mg/dL — ABNORMAL HIGH (ref 70–99)
Potassium: 5 mmol/L (ref 3.5–5.1)
Sodium: 124 mmol/L — ABNORMAL LOW (ref 135–145)
Total Bilirubin: 0.9 mg/dL (ref 0.3–1.2)
Total Protein: 6.7 g/dL (ref 6.5–8.1)

## 2023-03-06 LAB — TSH: TSH: 0.532 u[IU]/mL (ref 0.350–4.500)

## 2023-03-06 LAB — URIC ACID: Uric Acid, Serum: 9.7 mg/dL — ABNORMAL HIGH (ref 3.7–8.6)

## 2023-03-06 LAB — PROTIME-INR
INR: 1.5 — ABNORMAL HIGH (ref 0.8–1.2)
Prothrombin Time: 18.3 seconds — ABNORMAL HIGH (ref 11.4–15.2)

## 2023-03-06 LAB — BODY FLUID CELL COUNT WITH DIFFERENTIAL
Eos, Fluid: 4 %
Lymphs, Fluid: 24 %
Monocyte-Macrophage-Serous Fluid: 26 %
Neutrophil Count, Fluid: 46 %
Total Nucleated Cell Count, Fluid: 3883 cu mm

## 2023-03-06 LAB — SODIUM
Sodium: 125 mmol/L — ABNORMAL LOW (ref 135–145)
Sodium: 126 mmol/L — ABNORMAL LOW (ref 135–145)
Sodium: 130 mmol/L — ABNORMAL LOW (ref 135–145)
Sodium: 129 mmol/L — ABNORMAL LOW (ref 135–145)

## 2023-03-06 LAB — CORTISOL: Cortisol, Plasma: 8.2 ug/dL

## 2023-03-06 LAB — MAGNESIUM: Magnesium: 2.5 mg/dL — ABNORMAL HIGH (ref 1.7–2.4)

## 2023-03-06 LAB — GLUCOSE, CAPILLARY: Glucose-Capillary: 119 mg/dL — ABNORMAL HIGH (ref 70–99)

## 2023-03-06 LAB — PHOSPHORUS: Phosphorus: 4.3 mg/dL (ref 2.5–4.6)

## 2023-03-06 SURGERY — PERICARDIOCENTESIS
Anesthesia: Moderate Sedation

## 2023-03-06 MED ORDER — CHLORHEXIDINE GLUCONATE CLOTH 2 % EX PADS
6.0000 | MEDICATED_PAD | Freq: Every day | CUTANEOUS | Status: DC
  Administered 2023-03-06 – 2023-03-11 (×6): 6 via TOPICAL

## 2023-03-06 MED ORDER — LIDOCAINE HCL 1 % IJ SOLN
INTRAMUSCULAR | Status: AC
  Filled 2023-03-06: qty 20

## 2023-03-06 MED ORDER — SODIUM CHLORIDE 0.9 % IV SOLN
250.0000 mL | INTRAVENOUS | Status: DC | PRN
  Administered 2023-03-08: 10 mL via INTRAVENOUS

## 2023-03-06 MED ORDER — SODIUM CHLORIDE 0.9% FLUSH: 3.0000 mL | INTRAVENOUS | Status: DC | PRN

## 2023-03-06 MED ORDER — SODIUM BICARBONATE 8.4 % IV SOLN
50.0000 meq | Freq: Once | INTRAVENOUS | Status: AC
  Administered 2023-03-06: 50 meq via INTRAVENOUS
  Filled 2023-03-06: qty 50

## 2023-03-06 MED ORDER — IPRATROPIUM-ALBUTEROL 0.5-2.5 (3) MG/3ML IN SOLN: 3.0000 mL | Freq: Four times a day (QID) | RESPIRATORY_TRACT | Status: DC | PRN

## 2023-03-06 MED ORDER — COLCHICINE 0.6 MG PO TABS
0.6000 mg | ORAL_TABLET | Freq: Every day | ORAL | Status: DC
  Administered 2023-03-06 – 2023-03-12 (×6): 0.6 mg via ORAL
  Filled 2023-03-06 (×7): qty 1

## 2023-03-06 MED ORDER — MELATONIN 5 MG PO TABS
5.0000 mg | ORAL_TABLET | Freq: Every day | ORAL | Status: DC
  Administered 2023-03-06: 5 mg via ORAL
  Filled 2023-03-06: qty 1

## 2023-03-06 MED ORDER — TRAZODONE HCL 50 MG PO TABS
50.0000 mg | ORAL_TABLET | Freq: Every evening | ORAL | Status: DC | PRN
  Administered 2023-03-06: 50 mg via ORAL
  Filled 2023-03-06: qty 1

## 2023-03-06 MED ORDER — SODIUM CHLORIDE 0.9% FLUSH
3.0000 mL | Freq: Two times a day (BID) | INTRAVENOUS | Status: DC
  Administered 2023-03-06 – 2023-03-11 (×11): 3 mL via INTRAVENOUS

## 2023-03-06 MED ORDER — SODIUM CHLORIDE 0.9 % WEIGHT BASED INFUSION
1.0000 mL/kg/h | INTRAVENOUS | Status: DC
  Administered 2023-03-07: 1 mL/kg/h via INTRAVENOUS

## 2023-03-06 MED ORDER — SODIUM CHLORIDE 0.9 % WEIGHT BASED INFUSION: 3.0000 mL/kg/h | INTRAVENOUS | Status: AC

## 2023-03-06 MED ORDER — LIDOCAINE HCL (PF) 1 % IJ SOLN
INTRAMUSCULAR | Status: DC | PRN
  Administered 2023-03-06: 30 mL

## 2023-03-06 MED ORDER — HEPARIN (PORCINE) IN NACL 1000-0.9 UT/500ML-% IV SOLN
INTRAVENOUS | Status: DC | PRN
  Administered 2023-03-06: 500 mL

## 2023-03-06 SURGICAL SUPPLY — 7 items
BAG DRAINAGE 600ML DEPOT (BAG) IMPLANT
BAG DRN 24 TWST VLV ADJ (BAG) ×1
DRAPE BRACHIAL (DRAPES) IMPLANT
PACK CARDIAC CATH (CUSTOM PROCEDURE TRAY) IMPLANT
PAD ELECT DEFIB RADIOL ZOLL (MISCELLANEOUS) IMPLANT
SUT SILK 0 FSL (SUTURE) IMPLANT
TRAY PERICARDIOCENTESIS 6FX60 (TRAY / TRAY PROCEDURE) IMPLANT

## 2023-03-06 NOTE — Progress Notes (Signed)
Pt taken for pericardiocentesis.

## 2023-03-06 NOTE — Progress Notes (Signed)
*  PRELIMINARY RESULTS* Echocardiogram 2D Echocardiogram has been performed.  Cristela Blue 03/06/2023, 10:47 AM

## 2023-03-06 NOTE — Consult Note (Signed)
Consult requested by:  Dr. Huel [name]  Consult requested for:  Intracranial lesions  Primary Physician:  Cody Eva, NP  History of Present Illness: 03/06/2023 [name] is here today with a chief complaint of altered behavior for which she was brought to the emergency department for evaluation.  On initial evaluation, he was noted to be hyponatremic and CT scan of the brain was performed which showed possible intracranial lesions.  MRI scan was then performed.  The patient is examined via his son acting as interpreter.  He denies any headaches, nausea, or vomiting.  I have utilized the care everywhere function in epic to review the outside records available from external health systems.  Review of Systems:  A 10 point review of systems is negative, except for the pertinent positives and negatives detailed in the HPI.  Past Medical History: Past Medical History:  Diagnosis Date   Alcohol dependence (HCC)    CHF (congestive heart failure) (HCC)    Hypertension    Tobacco abuse     Past Surgical History: Past Surgical History:  Procedure Laterality Date   APPENDECTOMY     LEFT HEART CATH AND CORONARY ANGIOGRAPHY N/A 08/25/2018   Procedure: LEFT HEART CATH AND CORONARY ANGIOGRAPHY;  Surgeon: Laurier Nancy, MD;  Location: [facility] INVASIVE CV LAB;  Service: Cardiovascular;  Laterality: N/A;    Allergies: Allergies as of 03/05/2023 - Review Complete 03/05/2023  Allergen Reaction Noted   Shellfish allergy Hives 08/25/2018    Medications: Current Meds  Medication Sig   tamsulosin (FLOMAX) 0.4 MG CAPS capsule Take 1 capsule (0.4 mg total) by mouth daily.    Social History: Social History   Tobacco Use   Smoking status: Every Day    Packs/day: .5    Types: Cigarettes    Passive exposure: Current   Smokeless tobacco: Never  Vaping Use   Vaping Use: Never used  Substance Use Topics   Alcohol use: Yes    Alcohol/week: 35.0 standard drinks of alcohol    Types:  35 Cans of beer per week   Drug use: Never    Family Medical History: History reviewed. No pertinent family history.  Physical Examination: Vitals:   03/06/23 0900 03/06/23 1108  BP: 135/75   Pulse: 95 64  Resp: 20 19  Temp:    SpO2: 93% 92%    General: Patient is in no apparent distress. Attention to examination is appropriate.  Neck:   Supple.  Full range of motion.  Respiratory: Patient is breathing without any difficulty.   NEUROLOGICAL:     Awake, alert, oriented to person, place, and time.  Speech is clear and fluent. His son acts as Proofreader Nerves: Pupils equal round and reactive to light.  Facial tone is symmetric.  Facial sensation is symmetric. Shoulder shrug is symmetric. Tongue protrusion is midline.  There is no pronator drift.  Strength: MAEW with good strength   Hoffman's is absent.   Sensation grossly intact.  Gait is untested.     Medical Decision Making  Imaging: MRI Brain 03/05/2023 IMPRESSION: 1. Multiple contrast-enhancing lesions in the right frontal lobe, left occipital lobe, and superior right cerebellum with surrounding vasogenic edema. 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 2. Small dural-based contrast-enhancing lesion along the left frontal convexity could represent a small meningimoa, but metastatic disease is not excluded.     Electronically Signed   By: Cody [name] M.D.   On: 03/05/2023 16:48    I have  personally reviewed the images and agree with the above interpretation.  Assessment and Plan: [name] is a pleasant 77 y.o. 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  Given his long history of smoking, metastatic lung cancer is a strong possibility.  He has had a pericardiocentesis today and fluid was sent off for cytology.  If that is positive, I would not recommend brain biopsy.  If pericardiocentesis and possible  thoracentesis Cody [name] not show an obvious cause of these lesions, and open brain biopsy would be an option.  I would wait until his sodium level has corrected and he is outside the window of withdrawal from alcohol before proceeding with that.  For now, I recommended continuing with dexamethasone and weaning him towards 2 mg twice daily.  I would recommend continuing Keppra 500 mg twice daily.  Depending on the results of his various studies, he may ultimately require involvement of medical and radiation oncology.    I have communicated my recommendations to the requesting physician and coordinated care to facilitate these recommendations.     Cody [name] K. Cody Haff MD, [facility] Neurosurgery

## 2023-03-06 NOTE — Consult Note (Signed)
MEDICATION RELATED CONSULT NOTE  Pharmacy Consult for hypertonic saline monitoring Indication: hyponatremia  Allergies  Allergen Reactions   Shellfish Allergy Hives    Only allergic to crabs    Patient Measurements: Height: 5' (152.4 cm) Weight: 45 kg (99 lb 3.3 oz) IBW/kg (Calculated) : 50   Vital Signs: Temp: 97.8 F (36.6 C) (05/08 1600) Temp Source: Axillary (05/08 1600) BP: 111/63 (05/08 1800) Pulse Rate: 73 (05/08 1800) Intake/Output from previous day: 05/07 0701 - 05/08 0700 In: 810 [I.V.:362.1; IV Piggyback:447.9] Out: -  Intake/Output from this shift: Total I/O In: 1041 [P.O.:420; I.V.:246.3; IV Piggyback:374.7] Out: 650 [Urine:650]  Labs: Recent Labs    03/05/23 1112 03/06/23 0258 03/06/23 0849  WBC 13.2* 11.3*  --   HGB 10.9* 10.9*  --   HCT 32.8* 32.2*  --   PLT 357 309  --   CREATININE 1.18 0.99  --   MG  --   --  2.5*  PHOS  --   --  4.3  ALBUMIN 3.0* 2.7*  --   PROT 7.5 6.7  --   AST 333* 254*  --   ALT 214* 217*  --   ALKPHOS 84 82  --   BILITOT 1.0 0.9  --     Estimated Creatinine Clearance: 41 mL/min (by C-G formula based on SCr of 0.99 mg/dL).   Medical History: Past Medical History:  Diagnosis Date   Alcohol dependence (HCC)    CHF (congestive heart failure) (HCC)    Hypertension    Tobacco abuse     Assessment: 77 yo male brought to the emergency department by family due to memory loss, falls, and change in mental status.  Sodium found to be low at 117 in the ED.  Provider has order hypertonic saline infusion for correction.  Goal of Therapy:  Goal for acute hyponatremia: Increase Na by 4-6 mEq/L in 4-6 hours Goal for 24 hour correction generally no more than 8-10 mEq/L  Na Levels:  05/07 1112 Na 117 05/07 1422 NaCl 3% @ 25 ml/hr started 05/07 1656 Na 118 05/07 2213 Na 123  05/08 0258 Na 124  05/08 0849 Na 125 05/08 1455 Na 126 05/08 1806 Na 130 - RN to hold infusion per nephrology  Plan:  Na increase last 24h:  12 mmol/L Currently ordered NaCl 3% @ 25 ml/hr.  After discussion with nephrology. Will hold infusion at this time, RN aware Continue to monitor q4h   Sharen Hones, PharmD 03/06/2023,6:51 PM

## 2023-03-06 NOTE — Progress Notes (Signed)
SUBJECTIVE: Cody [name] is a 77yo [place] male with a PMH of HFpEF, alcohol use disorder, ongoing tobacco abuse, HTN who presented to [facility] ED 03/05/2023 with altered mental status (speaking nonsensically and having difficulty with his memory), generalized weakness, shortness of breath, and left-sided chest discomfort.  He was admitted to stepdown and head CT obtained showed abnormal vasogenic edema in the right posterior frontal region and left occipital lobe worrisome for mass lesions.  CT chest abdomen pelvis revealed a large pericardial effusion, confirmed by echo this morning to be a large circumferential pericardial effusion with findings consistent with cardiac tamponade.   Patient underwent urgent pericardiocentesis and pleural fluid analysis this morning.    Vitals:   03/06/23 1200 03/06/23 1230 03/06/23 1245 03/06/23 1300  BP: (!) 98/46 126/83 105/65 109/67  Pulse: 76 85 63 (!) 49  Resp: (!) 23 17 20 15   Temp:      TempSrc:      SpO2: 94% 92% 96% 94%  Weight:      Height:        Intake/Output Summary (Last 24 hours) at 03/06/2023 1535 Last data filed at 03/06/2023 0933 Gross per 24 hour  Intake 886.62 ml  Output 350 ml  Net 536.62 ml    LABS: Basic Metabolic Panel: Recent Labs    03/05/23 1112 03/05/23 1656 03/06/23 0258 03/06/23 0849 03/06/23 1455  NA 117*   < > 124* 125* 126*  K 4.9  --  5.0  --   --   CL 87*  --  95*  --   --   CO2 18*  --  17*  --   --   GLUCOSE 154*  --  129*  --   --   BUN 39*  --  42*  --   --   CREATININE 1.18  --  0.99  --   --   CALCIUM 8.5*  --  8.5*  --   --   MG  --   --   --  2.5*  --   PHOS  --   --   --  4.3  --    < > = values in this interval not displayed.   Liver Function Tests: Recent Labs    03/05/23 1112 03/06/23 0258  AST 333* 254*  ALT 214* 217*  ALKPHOS 84 82  BILITOT 1.0 0.9  PROT 7.5 6.7  ALBUMIN 3.0* 2.7*   No results for input(s): "LIPASE", "AMYLASE" in the last 72 hours. CBC: Recent Labs    03/05/23 1112  03/06/23 0258  WBC 13.2* 11.3*  NEUTROABS  --  9.2*  HGB 10.9* 10.9*  HCT 32.8* 32.2*  MCV 93.4 91.5  PLT 357 309   Cardiac Enzymes: No results for input(s): "CKTOTAL", "CKMB", "CKMBINDEX", "TROPONINI" in the last 72 hours. BNP: Invalid input(s): "POCBNP" D-Dimer: No results for input(s): "DDIMER" in the last 72 hours. Hemoglobin A1C: No results for input(s): "HGBA1C" in the last 72 hours. Fasting Lipid Panel: No results for input(s): "CHOL", "HDL", "LDLCALC", "TRIG", "CHOLHDL", "LDLDIRECT" in the last 72 hours. Thyroid Function Tests: Recent Labs    03/06/23 0258  TSH 0.532   Anemia Panel: No results for input(s): "VITAMINB12", "FOLATE", "FERRITIN", "TIBC", "IRON", "RETICCTPCT" in the last 72 hours.   PHYSICAL EXAM General: Well developed, well nourished, in no acute distress HEENT:  Normocephalic and atramatic Neck:  No JVD.  Lungs: Clear bilaterally to auscultation and percussion. Heart: HRRR . Normal S1 and S2 without gallops or murmurs.  Abdomen: Bowel sounds are positive, abdomen soft and non-tender  Msk:  Back normal, normal gait. Normal strength and tone for age. Extremities: No clubbing, cyanosis or edema.   Neuro: Alert and oriented X 3. Psych:  Good affect, responds appropriately  TELEMETRY: atrial fibrillation, HR 76 bpm  ASSESSMENT AND PLAN: Patient resting comfortably in bed. Spouse translates for patient. No acute complaints at this time. Patient repeatedly requests to go outside to smoke per spouse. Patient developed atrial fibrillation post procedure, rate controlled. Heparin being held for now. Will continue to follow.   Principal Problem:   Acute encephalopathy Active Problems:   Congestive heart failure (HCC)   Chronic obstructive pulmonary disease (HCC)   Acute hyponatremia   Pericardial effusion   Vasogenic edema (HCC)   Brain mass   Pleural effusion, bilateral   Alcohol use disorder   BPH (benign prostatic hyperplasia)   Elevated LFTs    Pneumonia of left lower lobe due to infectious organism   Metastasis to brain [facility])    Christiana Gurevich, FNP-C 03/06/2023 3:35 PM

## 2023-03-06 NOTE — Consult Note (Signed)
MEDICATION RELATED CONSULT NOTE  Pharmacy Consult for hypertonic saline monitoring Indication: hyponatremia  Allergies  Allergen Reactions   Shellfish Allergy Hives    Only allergic to crabs    Patient Measurements: Height: 5' (152.4 cm) Weight: 45 kg (99 lb 3.3 oz) IBW/kg (Calculated) : 50   Vital Signs: BP: 109/67 (05/08 1300) Pulse Rate: 49 (05/08 1300) Intake/Output from previous day: 05/07 0701 - 05/08 0700 In: 810 [I.V.:362.1; IV Piggyback:447.9] Out: -  Intake/Output from this shift: Total I/O In: 76.7 [I.V.:76.7] Out: 350 [Urine:350]  Labs: Recent Labs    03/05/23 1112 03/06/23 0258 03/06/23 0849  WBC 13.2* 11.3*  --   HGB 10.9* 10.9*  --   HCT 32.8* 32.2*  --   PLT 357 309  --   CREATININE 1.18 0.99  --   MG  --   --  2.5*  PHOS  --   --  4.3  ALBUMIN 3.0* 2.7*  --   PROT 7.5 6.7  --   AST 333* 254*  --   ALT 214* 217*  --   ALKPHOS 84 82  --   BILITOT 1.0 0.9  --     Estimated Creatinine Clearance: 41 mL/min (by C-G formula based on SCr of 0.99 mg/dL).   Medical History: Past Medical History:  Diagnosis Date   Alcohol dependence (HCC)    CHF (congestive heart failure) (HCC)    Hypertension    Tobacco abuse     Assessment: 77 yo male brought to the emergency department by family due to memory loss, falls, and change in mental status.  Sodium found to be low at 117 in the ED.  Provider has order hypertonic saline infusion for correction.  Goal of Therapy:  Goal for acute hyponatremia: Increase Na by 4-6 mEq/L in 4-6 hours Goal for 24 hour correction generally no more than 8-10 mEq/L  Na Levels:  05/07 1112 Na 117 05/07 1422 NaCl 3% @ 25 ml/hr started 05/07 1656 Na 118 05/07 2213 Na 123  05/08 0258 Na 124  05/08 0849 Na 125 05/08 1455 Na 126  Plan:  Na increase last 24h: 8 mmol/L Provider ordered NaCl 3% @ 25 ml/hr. Will continue at current rate Continue to monitor q4h Call RN to stop infusion and notify MD if   Na increases  by 6 mEq/L or more in 4 hours  Bettey Costa, PharmD 03/06/2023,3:44 PM

## 2023-03-06 NOTE — Consult Note (Signed)
MEDICATION RELATED CONSULT NOTE  Pharmacy Consult for hypertonic saline monitoring Indication: hyponatremia  Allergies  Allergen Reactions   Shellfish Allergy Hives    Only allergic to crabs    Patient Measurements: Height: 5' (152.4 cm) Weight: 45 kg (99 lb 3.3 oz) IBW/kg (Calculated) : 50   Vital Signs: Temp: 97.9 F (36.6 C) (05/08 0000) Temp Source: Oral (05/08 0000) BP: 125/75 (05/08 0300) Pulse Rate: 93 (05/08 0300) Intake/Output from previous day: 05/07 0701 - 05/08 0700 In: 636.6 [I.V.:286.6; IV Piggyback:350] Out: -  Intake/Output from this shift: Total I/O In: 286.6 [I.V.:286.6] Out: -   Labs: Recent Labs    03/05/23 1112 03/06/23 0258  WBC 13.2* 11.3*  HGB 10.9* 10.9*  HCT 32.8* 32.2*  PLT 357 309  CREATININE 1.18 0.99  ALBUMIN 3.0* 2.7*  PROT 7.5 6.7  AST 333* 254*  ALT 214* 217*  ALKPHOS 84 82  BILITOT 1.0 0.9    Estimated Creatinine Clearance: 41 mL/min (by C-G formula based on SCr of 0.99 mg/dL).   Medical History: Past Medical History:  Diagnosis Date   Alcohol dependence (HCC)    CHF (congestive heart failure) (HCC)    Hypertension    Tobacco abuse     Assessment: 77 yo male brought to the emergency department by family due to memory loss, falls, and change in mental status.  Sodium found to be low at 117 in the ED.  Provider has order hypertonic saline infusion for correction.  Goal of Therapy:  Goal for acute hyponatremia: Increase Na by 4-6 mEq/L in 4-6 hours Goal for 24 hour correction generally no more than 8-10 mEq/L  Na Levels:  05/07 1112 Na 117 05/07 1422 NaCl 3% @ 25 ml/hr started 05/07 1656 Na 118 05/07 2213 Na 123  05/08 0258 Na 124   Plan:  Increase within acceptable range, next check 5/8 @ 0700 Provider order NaCl 3% @ 25 ml/hr Monitor Na q2h x 2 occurrences, then q4h Call RN to stop infusion and notify MD if   Na increases by 4 mEq/L or more in the first 2 hours  Or  Na increases by 6 mEq/L or more in  the first 4 hours  Shaton Lore D, PharmD 03/06/2023,4:04 AM

## 2023-03-06 NOTE — Consult Note (Signed)
[facility]  CONSULT NOTE    Date: 03/06/2023                  Patient Name:  Cody [name]  MRN: [id number]  DOB: 02/05/1946  Age / Sex: 77 y.o., male         PCP: Orson Eva, NP                 Service Requesting Consult: TRH                 Reason for Consult: Hyponatremia            History of Present Illness: Cody [name] is a 77 y.o.  male with past medical conditions including hypertension, alcohol abuse, BPH, and heart failure with preserved EF, who was admitted to [facility] on 03/05/2023 for Acute hyponatremia [E87.1] Acute encephalopathy [G93.40] Pneumonia of left lower lobe due to infectious organism [J18.9] Vasogenic edema (HCC) [G93.6]  Patient seen and evaluated at bedside in ICU.  Patient remains confused and only oriented to self.  Spouse and friends at bedside.  History obtained from wife.  Wife states patient has been confused for 3 days prior to presentation.  Wife states that her and patient live at home, patient is now retired.  Patient has been drinking a lot of alcohol these past couple weeks.  More than usual.  Patient is also a current smoker.  Sodium on ED arrival 117.  Patient prescribed 3% hypertonic saline.  Sodium now 126.  CT chest, abdomen, pelvis with contrast shows large pericardial effusion, moderate Lyo right and small left effusion, borderline enlarged lymph nodes, and enlarged prostate with mass effect along the bladder.   Medications: Outpatient medications: Medications Prior to Admission  Medication Sig Dispense Refill Last Dose   tamsulosin (FLOMAX) 0.4 MG CAPS capsule Take 1 capsule (0.4 mg total) by mouth daily. 90 capsule 3 Past Week   allopurinol (ZYLOPRIM) 100 MG tablet Take 100 mg by mouth daily. (Patient not taking: Reported on 12/24/2022)      aspirin EC 81 MG tablet Take 81 mg by mouth daily. (Patient not taking: Reported on 03/05/2023)   Not Taking   atorvastatin (LIPITOR) 80 MG tablet Take 1 tablet by mouth every  evening. (Patient not taking: Reported on 03/05/2023)  5 Not Taking   carvedilol (COREG) 25 MG tablet Take 25 mg by mouth 2 (two) times daily. (Patient not taking: Reported on 03/05/2023)  3 Not Taking   colchicine 0.6 MG tablet Take by mouth.      ENTRESTO 24-26 MG Take 1 tablet by mouth 2 (two) times daily. (Patient not taking: Reported on 03/05/2023)  5 Not Taking   ezetimibe (ZETIA) 10 MG tablet Take 10 mg by mouth daily. (Patient not taking: Reported on 03/05/2023)   Not Taking   finasteride (PROSCAR) 5 MG tablet Take 1 tablet (5 mg total) by mouth daily. (Patient not taking: Reported on 03/05/2023) 90 tablet 3 Not Taking    Current medications: Current Facility-Administered Medications  Medication Dose Route Frequency Provider Last Rate Last Admin   0.9 %  sodium chloride infusion  250 mL Intravenous PRN Rebeca Allegra, PA-C       [START ON 03/07/2023] 0.9% sodium chloride infusion  3 mL/kg/hr Intravenous Continuous Tang, Cheryln Manly, PA-C       Followed by   Melene Muller ON 03/07/2023] 0.9% sodium chloride infusion  1 mL/kg/hr Intravenous Continuous Tang, Cheryln Manly, PA-C  acetaminophen (TYLENOL) tablet 650 mg  650 mg Oral Q6H PRN Verdene Lennert, MD       Or   acetaminophen (TYLENOL) suppository 650 mg  650 mg Rectal Q6H PRN Verdene Lennert, MD       azithromycin (ZITHROMAX) 500 mg in sodium chloride 0.9 % 250 mL IVPB  500 mg Intravenous Q24H Verdene Lennert, MD       cefTRIAXone (ROCEPHIN) 2 g in sodium chloride 0.9 % 100 mL IVPB  2 g Intravenous Q24H Verdene Lennert, MD 200 mL/hr at 03/06/23 1459 2 g at 03/06/23 1459   Chlorhexidine Gluconate Cloth 2 % PADS 6 each  6 each Topical Daily Gillis Santa, MD   6 each at 03/06/23 0841   dexamethasone (DECADRON) tablet 4 mg  4 mg Oral Daily Verdene Lennert, MD   4 mg at 03/06/23 1218   folic acid (FOLVITE) tablet 1 mg  1 mg Oral Daily Verdene Lennert, MD   1 mg at 03/06/23 1218   levETIRAcetam (KEPPRA) IVPB 500 mg/100 mL premix  500 mg  Intravenous Q12H Verdene Lennert, MD   Stopped at 03/06/23 0540   LORazepam (ATIVAN) tablet 1-4 mg  1-4 mg Oral Q1H PRN Verdene Lennert, MD       Or   LORazepam (ATIVAN) injection 1-4 mg  1-4 mg Intravenous Q1H PRN Verdene Lennert, MD       multivitamin with minerals tablet 1 tablet  1 tablet Oral Daily Verdene Lennert, MD   1 tablet at 03/06/23 1218   ondansetron (ZOFRAN) tablet 4 mg  4 mg Oral Q6H PRN Verdene Lennert, MD       Or   ondansetron (ZOFRAN) injection 4 mg  4 mg Intravenous Q6H PRN Verdene Lennert, MD       sodium chloride (hypertonic) 3 % solution   Intravenous Continuous Jene Every, MD 25 mL/hr at 03/06/23 1217 New Bag at 03/06/23 1217   sodium chloride flush (NS) 0.9 % injection 3 mL  3 mL Intravenous Q12H Verdene Lennert, MD   3 mL at 03/06/23 0854   sodium chloride flush (NS) 0.9 % injection 3 mL  3 mL Intravenous Q12H Rebeca Allegra, PA-C   3 mL at 03/06/23 1030   sodium chloride flush (NS) 0.9 % injection 3 mL  3 mL Intravenous PRN Tang, Cheryln Manly, PA-C       tamsulosin [facility]) capsule 0.4 mg  0.4 mg Oral Daily Verdene Lennert, MD   0.4 mg at 03/06/23 1219   thiamine (VITAMIN B1) tablet 100 mg  100 mg Oral Daily Verdene Lennert, MD   100 mg at 03/05/23 1648   Or   thiamine (VITAMIN B1) injection 100 mg  100 mg Intravenous Daily Verdene Lennert, MD   100 mg at 03/06/23 0858      Allergies: Allergies  Allergen Reactions   Shellfish Allergy Hives    Only allergic to crabs      Past Medical History: Past Medical History:  Diagnosis Date   Alcohol dependence (HCC)    CHF (congestive heart failure) (HCC)    Hypertension    Tobacco abuse      Past Surgical History: Past Surgical History:  Procedure Laterality Date   APPENDECTOMY     LEFT HEART CATH AND CORONARY ANGIOGRAPHY N/A 08/25/2018   Procedure: LEFT HEART CATH AND CORONARY ANGIOGRAPHY;  Surgeon: Laurier Nancy, MD;  Location: [facility] INVASIVE CV LAB;  Service: Cardiovascular;  Laterality: N/A;      Family History: History reviewed. No  pertinent family history.   Social History: Social History   Socioeconomic History   Marital status: Married    Spouse name: Not on file   Number of children: Not on file   Years of education: Not on file   Highest education level: Not on file  Occupational History   Not on file  Tobacco Use   Smoking status: Every Day    Packs/day: .5    Types: Cigarettes    Passive exposure: Current   Smokeless tobacco: Never  Vaping Use   Vaping Use: Never used  Substance and Sexual Activity   Alcohol use: Yes    Alcohol/week: 35.0 standard drinks of alcohol    Types: 35 Cans of beer per week   Drug use: Never   Sexual activity: Not Currently    Birth control/protection: None  Other Topics Concern   Not on file  Social History Narrative   Not on file   Social Determinants of Health   Financial Resource Strain: Low Risk  (08/25/2018)   Overall Financial Resource Strain (CARDIA)    Difficulty of Paying Living Expenses: Not very hard  Food Insecurity: Not on file  Transportation Needs: Unknown (08/25/2018)   PRAPARE - Transportation    Lack of Transportation (Medical): No    Lack of Transportation (Non-Medical): Not on file  Physical Activity: Not on file  Stress: No Stress Concern Present (08/25/2018)   [employer] of Occupational Health - Occupational Stress Questionnaire    Feeling of Stress : Only a little  Social Connections: Not on file  Intimate Partner Violence: Not on file     Review of Systems: Review of Systems  Unable to perform ROS: Mental status change    Vital Signs: Blood pressure 109/67, pulse (!) 49, temperature 97.9 F (36.6 C), temperature source Oral, resp. rate 15, height 5' (1.524 m), weight 45 kg, SpO2 94 %.  Weight trends: Filed Weights   03/05/23 1109 03/06/23 0000 03/06/23 0500  Weight: 45.4 kg 45 kg 45 kg    Physical Exam: General: NAD  Head: Normocephalic, atraumatic. Moist oral  mucosal membranes  Eyes: Anicteric  Lungs:  Clear to auscultation, normal effort  Heart: Regular rate and rhythm  Abdomen:  Soft, nontender, nondistended  Extremities: No peripheral edema.  Neurologic: Alert and oriented to self, moving all four extremities  Skin: No lesions  Access: None     Lab results: Basic Metabolic Panel: Recent Labs  Lab 03/05/23 1112 03/05/23 1656 03/05/23 2213 03/06/23 0258 03/06/23 0849  NA 117*   < > 123* 124* 125*  K 4.9  --   --  5.0  --   CL 87*  --   --  95*  --   CO2 18*  --   --  17*  --   GLUCOSE 154*  --   --  129*  --   BUN 39*  --   --  42*  --   CREATININE 1.18  --   --  0.99  --   CALCIUM 8.5*  --   --  8.5*  --   MG  --   --   --   --  2.5*  PHOS  --   --   --   --  4.3   < > = values in this interval not displayed.    Liver Function Tests: Recent Labs  Lab 03/05/23 1112 03/06/23 0258  AST 333* 254*  ALT 214* 217*  ALKPHOS 84 82  BILITOT 1.0 0.9  PROT 7.5 6.7  ALBUMIN 3.0* 2.7*   No results for input(s): "LIPASE", "AMYLASE" in the last 168 hours. No results for input(s): "AMMONIA" in the last 168 hours.  CBC: Recent Labs  Lab 03/05/23 1112 03/06/23 0258  WBC 13.2* 11.3*  NEUTROABS  --  9.2*  HGB 10.9* 10.9*  HCT 32.8* 32.2*  MCV 93.4 91.5  PLT 357 309    Cardiac Enzymes: No results for input(s): "CKTOTAL", "CKMB", "CKMBINDEX", "TROPONINI" in the last 168 hours.  BNP: Invalid input(s): "POCBNP"  CBG: Recent Labs  Lab 03/05/23 2257  GLUCAP 119*    Microbiology: Results for orders placed or performed during the hospital encounter of 03/05/23  Blood culture (routine x 2)     Status: None (Preliminary result)   Collection Time: 03/05/23  2:10 PM   Specimen: BLOOD  Result Value Ref Range Status   Specimen Description BLOOD BLOOD RIGHT ARM  Final   Special Requests   Final    BOTTLES DRAWN AEROBIC AND ANAEROBIC Blood Culture adequate volume   Culture   Final    NO GROWTH < 24 HOURS Performed at  [facility], [address] [place], [place] [address]    Report Status PENDING  Incomplete  Blood culture (routine x 2)     Status: None (Preliminary result)   Collection Time: 03/05/23  2:10 PM   Specimen: BLOOD  Result Value Ref Range Status   Specimen Description BLOOD BLOOD LEFT ARM  Final   Special Requests   Final    BOTTLES DRAWN AEROBIC AND ANAEROBIC Blood Culture adequate volume   Culture   Final    NO GROWTH < 24 HOURS Performed at [facility], [address] [place], [place] [address]    Report Status PENDING  Incomplete    Coagulation Studies: Recent Labs    03/06/23 0258  LABPROT 18.3*  INR 1.5*    Urinalysis: No results for input(s): "COLORURINE", "LABSPEC", "PHURINE", "GLUCOSEU", "HGBUR", "BILIRUBINUR", "KETONESUR", "PROTEINUR", "UROBILINOGEN", "NITRITE", "LEUKOCYTESUR" in the last 72 hours.  Invalid input(s): "APPERANCEUR"    Imaging: CARDIAC CATHETERIZATION  Result Date: 03/06/2023 Successful pericardiocentesis with placement of pigtail catheter   ECHOCARDIOGRAM COMPLETE  Result Date: 03/06/2023    ECHOCARDIOGRAM REPORT   Patient Name:   Cody [name]    Date of Exam: 03/05/2023 Medical Rec #:  [id number]  Height:       57.0 in Accession #:    [id number] Weight:       100.0 lb Date of Birth:  [birth year] BSA:          1.340 m Patient Age:    76 years   BP:           123/76 mmHg Patient Gender: M          HR:           74 bpm. Exam Location:  [facility] Procedure: 2D Echo, Cardiac Doppler and Color Doppler Indications:     I31.3 Pericardial Effusion  History:         Patient has prior history of Echocardiogram examinations, most                  recent 07/27/2018. CHF; Risk Factors:Hypertension, Current                  Smoker and Alcohol Abuse.  Sonographer:     Daphine Deutscher RDCS Referring Phys:  [id number] Verdene Lennert Diagnosing Phys: Yvonne Kendall MD IMPRESSIONS  1. Left ventricular ejection fraction, by estimation, is 55 to 60%. The left  ventricle has normal function. The left ventricle has no regional wall motion abnormalities. Left ventricular diastolic parameters are consistent with Grade I diastolic dysfunction (impaired relaxation).  2. Right ventricular systolic function is mildly reduced. The right ventricular size is normal. There is normal pulmonary artery systolic pressure.  3. Large pericardial effusion. The pericardial effusion is circumferential. Findings are consistent with cardiac tamponade.  4. The mitral valve is normal in structure. Mild mitral valve regurgitation.  5. Tricuspid valve regurgitation is mild to moderate.  6. The aortic valve is tricuspid. There is mild calcification of the aortic valve. There is mild thickening of the aortic valve. Aortic valve regurgitation is mild to moderate.  7. The inferior vena cava is dilated in size with <50% respiratory variability, suggesting right atrial pressure of 15 mmHg. Conclusion(s)/Recommendation(s): Findings were conveyed to Dr. Gillis Santa at 8:02 AM on 03/06/2023 by Dr. Cristal De[place] End. FINDINGS  Left Ventricle: Left ventricular ejection fraction, by estimation, is 55 to 60%. The left ventricle has normal function. The left ventricle has no regional wall motion abnormalities. The left ventricular internal cavity size was normal in size. There is  no left ventricular hypertrophy. Left ventricular diastolic parameters are consistent with Grade I diastolic dysfunction (impaired relaxation). Right Ventricle: The right ventricular size is normal. No increase in right ventricular wall thickness. Right ventricular systolic function is mildly reduced. There is normal pulmonary artery systolic pressure. The tricuspid regurgitant velocity is 1.71 m/s, and with an assumed right atrial pressure of 15 mmHg, the estimated right ventricular systolic pressure is 26.7 mmHg. Left Atrium: Left atrial size was normal in size. Right Atrium: Right atrial size was normal in size. Pericardium: A large  pericardial effusion is present. The pericardial effusion is circumferential. The pericardial effusion appears to contain fibrous material. There is diastolic collapse of the right ventricular free wall and excessive respiratory variation in the mitral valve spectral Doppler velocities. There is evidence of cardiac tamponade. Mitral Valve: The mitral valve is normal in structure. There is mild thickening of the mitral valve leaflet(s). Mild mitral valve regurgitation. Tricuspid Valve: The tricuspid valve is normal in structure. Tricuspid valve regurgitation is mild to moderate. Aortic Valve: The aortic valve is tricuspid. There is mild calcification of the aortic valve. There is mild thickening of the aortic valve. Aortic valve regurgitation is mild to moderate. Aortic regurgitation PHT measures 507 msec. Aortic valve mean gradient measures 2.5 mmHg. Aortic valve peak gradient measures 4.3 mmHg. Aortic valve area, by VTI measures 2.84 cm. Pulmonic Valve: The pulmonic valve was normal in structure. Pulmonic valve regurgitation is trivial. No evidence of pulmonic stenosis. Aorta: The aortic root and ascending aorta are structurally normal, with no evidence of dilitation. Pulmonary Artery: The pulmonary artery is of normal size. Venous: The inferior vena cava is dilated in size with less than 50% respiratory variability, suggesting right atrial pressure of 15 mmHg. IAS/Shunts: The interatrial septum was not well visualized.  LEFT VENTRICLE PLAX 2D LVIDd:         3.50 cm   Diastology LVIDs:         2.50 cm   LV e' medial:    4.46 cm/s LV PW:         0.90 cm   LV E/e' medial:  9.7 LV IVS:        0.80 cm   LV e' lateral:   6.31 cm/s LVOT diam:  2.00 cm   LV E/e' lateral: 6.9 LV SV:         46 LV SV Index:   34 LVOT Area:     3.14 cm  RIGHT VENTRICLE RV Basal diam:  3.10 cm RV S prime:     7.29 cm/s TAPSE (M-mode): 1.3 cm LEFT ATRIUM             Index        RIGHT ATRIUM           Index LA diam:        3.10 cm 2.31  cm/m   RA Area:     10.70 cm LA Vol (A2C):   37.7 ml 28.14 ml/m  RA Volume:   27.40 ml  20.45 ml/m LA Vol (A4C):   25.3 ml 18.88 ml/m LA Biplane Vol: 33.9 ml 25.30 ml/m  AORTIC VALVE AV Area (Vmax):    2.86 cm AV Area (Vmean):   2.60 cm AV Area (VTI):     2.84 cm AV Vmax:           103.69 cm/s AV Vmean:          74.930 cm/s AV VTI:            0.161 m AV Peak Grad:      4.3 mmHg AV Mean Grad:      2.5 mmHg LVOT Vmax:         94.33 cm/s LVOT Vmean:        62.000 cm/s LVOT VTI:          0.145 m LVOT/AV VTI ratio: 0.90 AI PHT:            507 msec  AORTA Ao Root diam: 3.20 cm Ao Asc diam:  3.50 cm MITRAL VALVE               TRICUSPID VALVE MV Area (PHT): 4.28 cm    TR Peak grad:   11.7 mmHg MV Decel Time: 177 msec    TR Vmax:        171.00 cm/s MV E velocity: 43.23 cm/s MV A velocity: 63.73 cm/s  SHUNTS MV E/A ratio:  0.68        Systemic VTI:  0.15 m                            Systemic Diam: 2.00 cm Yvonne Kendall MD Electronically signed by Yvonne Kendall MD Signature Date/Time: 03/06/2023/8:13:55 AM    Final    MR Brain W and Wo Contrast  Result Date: 03/05/2023 CLINICAL DATA:  Mental status change, unknown cause EXAM: MRI HEAD WITHOUT AND WITH CONTRAST TECHNIQUE: Multiplanar, multiecho pulse sequences of the brain and surrounding structures were obtained without and with intravenous contrast. CONTRAST:  5mL GADAVIST GADOBUTROL 1 MMOL/ML IV SOLN COMPARISON:  Same day CT head FINDINGS: Brain: Negative for an acute infarct. No hydrocephalus. No extra-axial fluid collection. Small microhemorrhage in the right lentiform nucleus. Chronic left cerebellar infarct. There is sequela mild-to-moderate chronic microvascular ischemic change. There are multiple lesions, including- -1.0 x 0.7 cm contrast-enhancing lesion in the right frontal lobe with surrounding vasogenic edema (series 18, image 108) - 0.8 x 0.7 cm contrast-enhancing lesions in in the left occipital lobe with surrounding vasogenic edema (series 18,  image 69). -1.2 x 1.1 cm contrast-enhancing lesion in the superior right cerebellum mild surrounding vasogenic edema (series 18, image 50) -0.6 x 0.4 cm dural-based contrast-enhancing lesion  along the left frontal convexity (series 93, image 176). Vascular: Normal flow voids. Skull and upper cervical spine: Normal marrow signal. Sinuses/Orbits: No middle ear or mastoid effusion. Mild mucosal thickening bilateral ethmoid air cells. Orbits are unremarkable. Other: None. IMPRESSION: 1. Multiple contrast-enhancing lesions in the right frontal lobe, left occipital lobe, and superior right cerebellum with surrounding vasogenic edema. 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 2. Small dural-based contrast-enhancing lesion along the left frontal convexity could represent a small meningimoa, but metastatic disease is not excluded. Electronically Signed   By: Lorenza Cambridge M.D.   On: 03/05/2023 16:48   CT CHEST ABDOMEN PELVIS W CONTRAST  Result Date: 03/05/2023 CLINICAL DATA:  Brain mass. Assess for occult malignancy. * Tracking Code: BO * EXAM: CT CHEST, ABDOMEN, AND PELVIS WITH CONTRAST TECHNIQUE: Multidetector CT imaging of the chest, abdomen and pelvis was performed following the standard protocol during bolus administration of intravenous contrast. RADIATION DOSE REDUCTION: This exam was performed according to the departmental dose-optimization program which includes automated exposure control, adjustment of the mA and/or kV according to patient size and/or use of iterative reconstruction technique. CONTRAST:  75mL OMNIPAQUE IOHEXOL 300 MG/ML  SOLN COMPARISON:  Chest CT without contrast 04/28/2015. X-ray chest earlier 03/05/2023 FINDINGS: CT CHEST FINDINGS Cardiovascular: Very large pericardial effusion. The heart itself is nonenlarged. Significant coronary artery calcifications. The thoracic aorta has a normal course and caliber with mild atherosclerotic calcified plaque. Mediastinum/Nodes: Normal  caliber thoracic esophagus. Preserved thyroid gland. No specific abnormal lymph node enlargement seen including in the axillary regions, hilum. Prominent right paratracheal node on series 2, image 18 measures 18 by 7 mm. This could be 2 adjacent nodes. Subcarinal node series 2, image 29 measures 2.7 by 1.1 cm, mildly enlarged. Lungs/Pleura: Moderate right and small left effusion. There are some loculated components on the left with fluid tracking along the interlobar fissure. Adjacent parenchymal opacities. Atelectasis versus infiltrate. There is a opacity specifically in the lingula as well. Scattered ground-glass elsewhere in the visualized lungs. No pneumothorax. No obvious mass. Significant breathing motion throughout the examination. A subtle mass lesion could be obscured by the lung opacity in the effusions. Follow up study could be performed after improvement of the effusions. Musculoskeletal: Mild degenerative changes along the spine. CT ABDOMEN PELVIS FINDINGS Hepatobiliary: Contrast exam in the abdomen is more in the late arterial phase and portal venous phase. Patent portal vein. Gallbladder is present. Small cysts seen in segment 4 of the liver. Pancreas: Unremarkable. No pancreatic ductal dilatation or surrounding inflammatory changes. Spleen: Normal in size without focal abnormality. Adrenals/Urinary Tract: Slight nonspecific thickening of the adrenal glands but unchanged from previous. No enhancing renal mass or collecting system dilatation. There are some small low-attenuation lesions in the left kidney which are too small to completely characterize but likely benign cysts, Bosniak 2 lesions. No specific imaging follow-up. The ureters have normal course and caliber extending down to the bladder. Diffuse wall thickening of the urinary bladder. Stomach/Bowel: On this non oral contrast exam, large bowel has a normal caliber. Redundant course of the sigmoid colon. There are some areas of mild wall  thickening along loops of bowel, nonspecific. The stomach is mildly distended with fluid. Mild fold thickening suggested small bowel is nondilated. Vascular/Lymphatic: Normal caliber aorta and IVC with moderate calcified plaque along the aorta and iliac vessels. There are several enlarged nodes identified in the retroperitoneum and upper abdomen. Example aortocaval node posteriorly on series 2, image 64 measures 17 by 13 mm. No  next of the celiac series 2, image 59 measures 16 by 14 mm. Left para-node anteriorly on series 2, image 68 measures 17 by 8 mm. A few small retrocrural nodes are also identified. Reproductive: Enlarged heterogeneous prostate. Please correlate with patient's PSA. Other: Anasarca.  Mesenteric haziness.  Trace ascites.  No free air Musculoskeletal: Mild degenerative changes along the spine. IMPRESSION: Large pericardial effusion. Please correlate with and new symptoms including for tamponade. Moderate right and small left effusion with some loculated components on the left. Adjacent parenchymal opacities. Atelectasis versus infiltrate. Recommend follow up after clearance of the effusions. Few borderline enlarged lymph nodes identified including mediastinum, upper retroperitoneum. Enlarged prostate with mass effect along the bladder with bladder wall thickening. Please correlate with patient's PSA. No bowel obstruction or free air. There is scattered mesenteric haziness with trace ascites. There are some small areas of wall thickening along the colon but this could simply relate to the other signs of edema. Electronically Signed   By: Karen Kays M.D.   On: 03/05/2023 15:03   DG Chest Port 1 View  Result Date: 03/05/2023 CLINICAL DATA:  Shortness of breath EXAM: PORTABLE CHEST 1 VIEW COMPARISON:  07/27/2018 FINDINGS: Cardiac silhouette is enlarged with diffuse interstitial opacities throughout both lungs compatible with edema related to CHF. Basilar atelectasis noted, worse on the left.  Difficult to exclude small effusions. Also left lower lobe retrocardiac opacity obscures the left hemidiaphragm concerning for possible superimposed pneumonia. No pneumothorax. Trachea midline. Aorta atherosclerotic. No acute osseous finding. IMPRESSION: 1. Cardiomegaly with diffuse interstitial edema pattern. 2. Left lower lobe airspace consolidation concerning for superimposed pneumonia. 3. Suspect small effusions. Electronically Signed   By: Judie Petit.  Shick M.D.   On: 03/05/2023 13:20   CT Head Wo Contrast  Result Date: 03/05/2023 CLINICAL DATA:  Mental status change of unknown cause. Memory disturbance. EXAM: CT HEAD WITHOUT CONTRAST TECHNIQUE: Contiguous axial images were obtained from the base of the skull through the vertex without intravenous contrast. RADIATION DOSE REDUCTION: This exam was performed according to the departmental dose-optimization program which includes automated exposure control, adjustment of the mA and/or kV according to patient size and/or use of iterative reconstruction technique. COMPARISON:  07/27/2018 FINDINGS: Brain: No focal abnormality affects the brainstem. Question mild edema and mass effect in the inferior right cerebellum. 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 There is abnormal vasogenic edema within the right posterior frontal region worrisome for an underlying mass lesion. In the left hemisphere, there is abnormal vasogenic edema in the occipital lobe worrisome for the presence of an occult mass lesion. No visible hemorrhage. Hydrocephalus or extra-axial collection. Vascular: There is atherosclerotic calcification of the major vessels at the base of the brain. Skull: No calvarial abnormality. Sinuses/Orbits: Clear/normal Other: None IMPRESSION: 1. Abnormal vasogenic edema in the right posterior frontal region and in the left occipital lobe worrisome for the presence of occult mass lesions. Question mild edema and mass effect in the inferior right cerebellum. No  visible hemorrhage. MRI with and without contrast is recommended for further evaluation. 2. 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 3. Atherosclerotic calcification of the major vessels at the base of the brain. Electronically Signed   By: Paulina Fusi M.D.   On: 03/05/2023 12:52     Assessment & Plan: [name] is a 77 y.o.  male with past medical conditions including hypertension, alcohol abuse, BPH, and heart failure with preserved EF, who was admitted to [facility] on 03/05/2023 for Acute hyponatremia [  E87.1] Acute encephalopathy [G93.40] Pneumonia of left lower lobe due to infectious organism [J18.9] Vasogenic edema (HCC) [G93.6]  Hyponatremia likely secondary to increased fluid intake and suspected malignancy seen on CT chest, abdomen, and pelvis.  Patient does have tobacco use history.  Sodium on ED arrival 117, now 126 with 3% hypertonic saline.  Continue IV fluids for now.  Goal correction 10 per 24 hours.  Will continue to monitor for now.    LOS: 1 Teejay Meader 5/8/20243:06 PM

## 2023-03-06 NOTE — Consult Note (Signed)
Consultation Note Date: 03/06/2023   Patient Name: Cody [name]  DOB: August 10, 1946  MRN: [id number]  Age / Sex: 77 y.o., male  PCP: Orson Eva, NP Referring Physician: Gillis Santa, MD  Reason for Consultation: Establishing goals of care  HPI/Patient Profile:  Patient's son states that for the last 1 week, they have noticed that [name] has been experiencing increased shortness of breath with productive cough.  Shortness of breath occurs both at rest and with exertion. He has experienced palpitations but denies any shortness of breathe. No lower extremity swelling. In addition, he has been generally weak, however no focal weakness  Then for the last 2 to 3-days, they noticed that he has been speaking nonsensically and seems to have difficulty with his memory. Two days ago, patient felt feverish but no chills, nausea, vomiting or abdominal pain.  Patient's son states that patient has been drinking alcohol daily for a long time now.  He drinks approximately 10 beers that are 12 ounces per day at the minimum.  In addition, he smokes daily.  Clinical Assessment and Goals of Care: Patient is sitting in bed with wife at bedside. She states he has 10 children, and he has an 11th child they share. Nurse entered with tele-interpretor. Attempted to determine what updates he had. Patient began talking to the interpretor and would not allow her to translate to me as he kept talking without stopping. Wife attempted to speak and patient stopped her from talking, and interpretor stated he was upset. Patient was becoming more visibly upset. Wife was able to tell me updates. Dicussed I would return tomorrow.  Nurse remained at bedside.     SUMMARY OF RECOMMENDATIONS   PMT will follow up tomorrow. Family waiting for test results.   Continue CIWA. Would consider Ativan for anxiety.    Prognosis:  Unable to determine       Primary Diagnoses: Present on Admission:  Acute encephalopathy  Chronic obstructive pulmonary disease (HCC)   I have reviewed the medical record, interviewed the patient and family, and examined the patient. The following aspects are pertinent.  Past Medical History:  Diagnosis Date   Alcohol dependence (HCC)    CHF (congestive heart failure) (HCC)    Hypertension    Tobacco abuse    Social History   Socioeconomic History   Marital status: Married    Spouse name: Not on file   Number of children: Not on file   Years of education: Not on file   Highest education level: Not on file  Occupational History   Not on file  Tobacco Use   Smoking status: Every Day    Packs/day: .5    Types: Cigarettes    Passive exposure: Current   Smokeless tobacco: Never  Vaping Use   Vaping Use: Never used  Substance and Sexual Activity   Alcohol use: Yes    Alcohol/week: 35.0 standard drinks of alcohol    Types: 35 Cans of beer per week   Drug use: Never   Sexual  activity: Not Currently    Birth control/protection: None  Other Topics Concern   Not on file  Social History Narrative   Not on file   Social Determinants of Health   Financial Resource Strain: Low Risk  (08/25/2018)   Overall Financial Resource Strain (CARDIA)    Difficulty of Paying Living Expenses: Not very hard  Food Insecurity: Not on file  Transportation Needs: Unknown (08/25/2018)   PRAPARE - Transportation    Lack of Transportation (Medical): No    Lack of Transportation (Non-Medical): Not on file  Physical Activity: Not on file  Stress: No Stress Concern Present (08/25/2018)   [employer] of Occupational Health - Occupational Stress Questionnaire    Feeling of Stress : Only a little  Social Connections: Not on file   History reviewed. No pertinent family history. Scheduled Meds:  Chlorhexidine Gluconate Cloth  6 each Topical Daily   dexamethasone  4 mg Oral Daily   folic acid  1 mg Oral Daily    multivitamin with minerals  1 tablet Oral Daily   sodium chloride flush  3 mL Intravenous Q12H   sodium chloride flush  3 mL Intravenous Q12H   tamsulosin  0.4 mg Oral Daily   thiamine  100 mg Oral Daily   Or   thiamine  100 mg Intravenous Daily   Continuous Infusions:  sodium chloride     [START ON 03/07/2023] sodium chloride     Followed by   Melene Muller ON 03/07/2023] sodium chloride     azithromycin (ZITHROMAX) 500 mg in sodium chloride 0.9 % 250 mL IVPB     cefTRIAXone (ROCEPHIN)  IV     levETIRAcetam Stopped (03/06/23 0540)   sodium chloride (hypertonic) 25 mL/hr at 03/06/23 1217   PRN Meds:.sodium chloride, acetaminophen **OR** acetaminophen, LORazepam **OR** LORazepam, ondansetron **OR** ondansetron (ZOFRAN) IV, sodium chloride flush Medications Prior to Admission:  Prior to Admission medications   Medication Sig Start Date End Date Taking? Authorizing Provider  tamsulosin (FLOMAX) 0.4 MG CAPS capsule Take 1 capsule (0.4 mg total) by mouth daily. 07/17/22  Yes Sondra Come, MD  allopurinol (ZYLOPRIM) 100 MG tablet Take 100 mg by mouth daily. Patient not taking: Reported on 12/24/2022 10/17/21   [provider]  aspirin EC 81 MG tablet Take 81 mg by mouth daily. Patient not taking: Reported on 03/05/2023    [provider]  atorvastatin (LIPITOR) 80 MG tablet Take 1 tablet by mouth every evening. Patient not taking: Reported on 03/05/2023 06/26/18   [provider]  carvedilol (COREG) 25 MG tablet Take 25 mg by mouth 2 (two) times daily. Patient not taking: Reported on 03/05/2023 06/26/18   [provider]  colchicine 0.6 MG tablet Take by mouth. 06/05/22 09/03/22  [provider]  ENTRESTO 24-26 MG Take 1 tablet by mouth 2 (two) times daily. Patient not taking: Reported on 03/05/2023 06/26/18   [provider]  ezetimibe (ZETIA) 10 MG tablet Take 10 mg by mouth daily. Patient not taking: Reported on 03/05/2023 10/13/21   [provider]  finasteride (PROSCAR) 5 MG tablet Take 1 tablet (5 mg total) by mouth daily. Patient not taking: Reported on 03/05/2023 07/17/22   Sondra Come, MD   Allergies  Allergen Reactions   Shellfish Allergy Hives    Only allergic to crabs   Review of Systems  Unable to perform ROS   Physical Exam Pulmonary:     Effort: Pulmonary effort is normal.  Neurological:  Mental Status: He is alert.     Vital Signs: BP 109/67   Pulse (!) 49   Temp 97.9 F (36.6 C) (Oral)   Resp 15   Ht 5' (1.524 m)   Wt 45 kg   SpO2 94%   BMI 19.38 kg/m  Pain Scale: 0-10   Pain Score: 0-No pain   SpO2: SpO2: 94 % O2 Device:SpO2: 94 % O2 Flow Rate: .O2 Flow Rate (L/min): 2 L/min  IO: Intake/output summary:  Intake/Output Summary (Last 24 hours) at 03/06/2023 1339 Last data filed at 03/06/2023 1610 Gross per 24 hour  Intake 886.62 ml  Output 350 ml  Net 536.62 ml    LBM: Last BM Date :  (PTA) Baseline Weight: Weight: 45.4 kg Most recent weight: Weight: 45 kg        Signed by: Morton Stall, NP   Please contact Palliative Medicine Team phone at [phone] for questions and concerns.  For individual provider: See Loretha Stapler

## 2023-03-06 NOTE — Consult Note (Signed)
[facility] CLINIC CARDIOLOGY CONSULT NOTE       Patient ID: Cody [name] MRN: [id number] DOB/AGE: November 06, 1945 77 y.o.  Admit date: 03/05/2023 Referring Physician Dr. Gillis Santa Primary Physician Orson Eva, NP  Primary Cardiologist Dr. Adrian Blackwater Reason for Consultation cardiac tamponade  HPI: Cody [name] is a 77yo [place] male with a PMH of HFpEF, alcohol use disorder, ongoing tobacco abuse, HTN who presented to [facility] ED 03/05/2023 with altered mental status (speaking nonsensically and having difficulty with his memory), generalized weakness, shortness of breath, and left-sided chest discomfort.  He was admitted to stepdown and head CT obtained showed abnormal vasogenic edema in the right posterior frontal region and left occipital lobe worrisome for mass lesions.  CT chest abdomen pelvis revealed a large pericardial effusion, confirmed by echo this morning to be a large circumferential pericardial effusion with findings consistent with cardiac tamponade.  History is obtained from the patient's family (nephew and daughter at bedside) who interpret for the patient and his wife.  They state that the patient felt feverish runny nose a couple days ago and thought that he was having allergies.  He was also speaking nonsensically and having a lot of difficulty with his memory which was unusual for him over the past week.  He is a daily smoker, and daily alcohol drinker, consuming 6-7 beers daily per family.  CT head obtained showed abnormal vasogenic edema in the right posterior frontal and left occipital lobe worrisome for mass lesions.  CT chest abdomen pelvis obtained showed a very large pericardial effusion, moderate right and left pleural effusions with some loculated components on the left.  There were a few borderline enlarged lymph nodes and identified including the mediastinum, and upper retroperitoneum.  The prostate was enlarged with mass effect along the bladder with bladder wall thickening.   Echocardiogram obtained confirmed that a large pericardial effusion with findings concerning for cardiac tamponade for which we were urgently consulted.  At my time of evaluation this morning the patient is sitting upright in stepdown bed on room air with multiple family members at bedside.  His nephew confirms that he is speaking "nonsense" in his native language ([place]  ROS is otherwise limited due to his altered mental status.  He is hypertensive and currently hemodynamically stable, in sinus rhythm on telemetry with rate in the 80s to 90s with occasional premature atrial contractions.  Past Medical History:  Diagnosis Date   Alcohol dependence (HCC)    CHF (congestive heart failure) (HCC)    Hypertension    Tobacco abuse     Past Surgical History:  Procedure Laterality Date   APPENDECTOMY     LEFT HEART CATH AND CORONARY ANGIOGRAPHY N/A 08/25/2018   Procedure: LEFT HEART CATH AND CORONARY ANGIOGRAPHY;  Surgeon: Laurier Nancy, MD;  Location: [facility] INVASIVE CV LAB;  Service: Cardiovascular;  Laterality: N/A;    Medications Prior to Admission  Medication Sig Dispense Refill Last Dose   tamsulosin (FLOMAX) 0.4 MG CAPS capsule Take 1 capsule (0.4 mg total) by mouth daily. 90 capsule 3 Past Week   allopurinol (ZYLOPRIM) 100 MG tablet Take 100 mg by mouth daily. (Patient not taking: Reported on 12/24/2022)      aspirin EC 81 MG tablet Take 81 mg by mouth daily. (Patient not taking: Reported on 03/05/2023)   Not Taking   atorvastatin (LIPITOR) 80 MG tablet Take 1 tablet by mouth every evening. (Patient not taking: Reported on 03/05/2023)  5 Not Taking   carvedilol (COREG) 25 MG  tablet Take 25 mg by mouth 2 (two) times daily. (Patient not taking: Reported on 03/05/2023)  3 Not Taking   colchicine 0.6 MG tablet Take by mouth.      ENTRESTO 24-26 MG Take 1 tablet by mouth 2 (two) times daily. (Patient not taking: Reported on 03/05/2023)  5 Not Taking   ezetimibe (ZETIA) 10 MG tablet Take 10 mg by  mouth daily. (Patient not taking: Reported on 03/05/2023)   Not Taking   finasteride (PROSCAR) 5 MG tablet Take 1 tablet (5 mg total) by mouth daily. (Patient not taking: Reported on 03/05/2023) 90 tablet 3 Not Taking   Social History   Socioeconomic History   Marital status: Married    Spouse name: Not on file   Number of children: Not on file   Years of education: Not on file   Highest education level: Not on file  Occupational History   Not on file  Tobacco Use   Smoking status: Every Day    Packs/day: .5    Types: Cigarettes    Passive exposure: Current   Smokeless tobacco: Never  Vaping Use   Vaping Use: Never used  Substance and Sexual Activity   Alcohol use: Yes    Alcohol/week: 35.0 standard drinks of alcohol    Types: 35 Cans of beer per week   Drug use: Never   Sexual activity: Not Currently    Birth control/protection: None  Other Topics Concern   Not on file  Social History Narrative   Not on file   Social Determinants of Health   Financial Resource Strain: Low Risk  (08/25/2018)   Overall Financial Resource Strain (CARDIA)    Difficulty of Paying Living Expenses: Not very hard  Food Insecurity: Not on file  Transportation Needs: Unknown (08/25/2018)   PRAPARE - Transportation    Lack of Transportation (Medical): No    Lack of Transportation (Non-Medical): Not on file  Physical Activity: Not on file  Stress: No Stress Concern Present (08/25/2018)   [employer] of Occupational Health - Occupational Stress Questionnaire    Feeling of Stress : Only a little  Social Connections: Not on file  Intimate Partner Violence: Not on file    History reviewed. No pertinent family history.    Intake/Output Summary (Last 24 hours) at 03/06/2023 0908 Last data filed at 03/06/2023 0830 Gross per 24 hour  Intake 809.95 ml  Output 350 ml  Net 459.95 ml    Vitals:   03/06/23 0730 03/06/23 0800 03/06/23 0830 03/06/23 0900  BP: 125/87 (!) 137/114 121/87 135/75   Pulse: 80 100 95 95  Resp: 14 (!) 23 (!) 23 20  Temp:      TempSrc:      SpO2: 98% 96% 97% 93%  Weight:      Height:        PHYSICAL EXAM General: Ill-appearing thin Asian male, sitting upright in stepdown bed with multiple family members at bedside.  HEENT:  Normocephalic and atraumatic. Neck:  + JVD.  Lungs: Normal respiratory effort on room air.  Coarse breath sounds to auscultation anteriorly Heart: HRRR without murmurs. Abdomen: Non-distended appearing.  Msk: Normal strength and tone for age. Extremities: Warm and well perfused. No clubbing, cyanosis.  Neuro: Alert and conversant but with nonsensical speech Psych: Calm and cooperative  Labs: Basic Metabolic Panel: Recent Labs    03/05/23 1112 03/05/23 1656 03/05/23 2213 03/06/23 0258  NA 117*   < > 123* 124*  K 4.9  --   --  5.0  CL 87*  --   --  95*  CO2 18*  --   --  17*  GLUCOSE 154*  --   --  129*  BUN 39*  --   --  42*  CREATININE 1.18  --   --  0.99  CALCIUM 8.5*  --   --  8.5*   < > = values in this interval not displayed.   Liver Function Tests: Recent Labs    03/05/23 1112 03/06/23 0258  AST 333* 254*  ALT 214* 217*  ALKPHOS 84 82  BILITOT 1.0 0.9  PROT 7.5 6.7  ALBUMIN 3.0* 2.7*   No results for input(s): "LIPASE", "AMYLASE" in the last 72 hours. CBC: Recent Labs    03/05/23 1112 03/06/23 0258  WBC 13.2* 11.3*  NEUTROABS  --  9.2*  HGB 10.9* 10.9*  HCT 32.8* 32.2*  MCV 93.4 91.5  PLT 357 309   Cardiac Enzymes: No results for input(s): "CKTOTAL", "CKMB", "CKMBINDEX", "TROPONINIHS" in the last 72 hours. BNP: Recent Labs    03/05/23 1112  BNP 99.4   D-Dimer: No results for input(s): "DDIMER" in the last 72 hours. Hemoglobin A1C: No results for input(s): "HGBA1C" in the last 72 hours. Fasting Lipid Panel: No results for input(s): "CHOL", "HDL", "LDLCALC", "TRIG", "CHOLHDL", "LDLDIRECT" in the last 72 hours. Thyroid Function Tests: Recent Labs    03/06/23 0258  TSH 0.532    Anemia Panel: No results for input(s): "VITAMINB12", "FOLATE", "FERRITIN", "TIBC", "IRON", "RETICCTPCT" in the last 72 hours.   Radiology: ECHOCARDIOGRAM COMPLETE  Result Date: 03/06/2023    ECHOCARDIOGRAM REPORT   Patient Name:   Carlin Grunow    Date of Exam: 03/05/2023 Medical Rec #:  [id number]  Height:       57.0 in Accession #:    [id number] Weight:       100.0 lb Date of Birth:  07-29-1946 BSA:          1.340 m Patient Age:    76 years   BP:           123/76 mmHg Patient Gender: M          HR:           74 bpm. Exam Location:  [facility] Procedure: 2D Echo, Cardiac Doppler and Color Doppler Indications:     I31.3 Pericardial Effusion  History:         Patient has prior history of Echocardiogram examinations, most                  recent 07/27/2018. CHF; Risk Factors:Hypertension, Current                  Smoker and Alcohol Abuse.  Sonographer:     Daphine Deutscher RDCS Referring Phys:  [id number] Verdene Lennert Diagnosing Phys: Yvonne Kendall MD IMPRESSIONS  1. Left ventricular ejection fraction, by estimation, is 55 to 60%. The left ventricle has normal function. The left ventricle has no regional wall motion abnormalities. Left ventricular diastolic parameters are consistent with Grade I diastolic dysfunction (impaired relaxation).  2. Right ventricular systolic function is mildly reduced. The right ventricular size is normal. There is normal pulmonary artery systolic pressure.  3. Large pericardial effusion. The pericardial effusion is circumferential. Findings are consistent with cardiac tamponade.  4. The mitral valve is normal in structure. Mild mitral valve regurgitation.  5. Tricuspid valve regurgitation is mild to moderate.  6. The aortic valve is tricuspid. There is mild calcification  of the aortic valve. There is mild thickening of the aortic valve. Aortic valve regurgitation is mild to moderate.  7. The inferior vena cava is dilated in size with <50% respiratory variability, suggesting right atrial  pressure of 15 mmHg. Conclusion(s)/Recommendation(s): Findings were conveyed to Dr. Gillis Santa at 8:02 AM on 03/06/2023 by Dr. Cristal Deer End. FINDINGS  Left Ventricle: Left ventricular ejection fraction, by estimation, is 55 to 60%. The left ventricle has normal function. The left ventricle has no regional wall motion abnormalities. The left ventricular internal cavity size was normal in size. There is  no left ventricular hypertrophy. Left ventricular diastolic parameters are consistent with Grade I diastolic dysfunction (impaired relaxation). Right Ventricle: The right ventricular size is normal. No increase in right ventricular wall thickness. Right ventricular systolic function is mildly reduced. There is normal pulmonary artery systolic pressure. The tricuspid regurgitant velocity is 1.71 m/s, and with an assumed right atrial pressure of 15 mmHg, the estimated right ventricular systolic pressure is 26.7 mmHg. Left Atrium: Left atrial size was normal in size. Right Atrium: Right atrial size was normal in size. Pericardium: A large pericardial effusion is present. The pericardial effusion is circumferential. The pericardial effusion appears to contain fibrous material. There is diastolic collapse of the right ventricular free wall and excessive respiratory variation in the mitral valve spectral Doppler velocities. There is evidence of cardiac tamponade. Mitral Valve: The mitral valve is normal in structure. There is mild thickening of the mitral valve leaflet(s). Mild mitral valve regurgitation. Tricuspid Valve: The tricuspid valve is normal in structure. Tricuspid valve regurgitation is mild to moderate. Aortic Valve: The aortic valve is tricuspid. There is mild calcification of the aortic valve. There is mild thickening of the aortic valve. Aortic valve regurgitation is mild to moderate. Aortic regurgitation PHT measures 507 msec. Aortic valve mean gradient measures 2.5 mmHg. Aortic valve peak gradient  measures 4.3 mmHg. Aortic valve area, by VTI measures 2.84 cm. Pulmonic Valve: The pulmonic valve was normal in structure. Pulmonic valve regurgitation is trivial. No evidence of pulmonic stenosis. Aorta: The aortic root and ascending aorta are structurally normal, with no evidence of dilitation. Pulmonary Artery: The pulmonary artery is of normal size. Venous: The inferior vena cava is dilated in size with less than 50% respiratory variability, suggesting right atrial pressure of 15 mmHg. IAS/Shunts: The interatrial septum was not well visualized.  LEFT VENTRICLE PLAX 2D LVIDd:         3.50 cm   Diastology LVIDs:         2.50 cm   LV e' medial:    4.46 cm/s LV PW:         0.90 cm   LV E/e' medial:  9.7 LV IVS:        0.80 cm   LV e' lateral:   6.31 cm/s LVOT diam:     2.00 cm   LV E/e' lateral: 6.9 LV SV:         46 LV SV Index:   34 LVOT Area:     3.14 cm  RIGHT VENTRICLE RV Basal diam:  3.10 cm RV S prime:     7.29 cm/s TAPSE (M-mode): 1.3 cm LEFT ATRIUM             Index        RIGHT ATRIUM           Index LA diam:        3.10 cm 2.31 cm/m   RA Area:  10.70 cm LA Vol (A2C):   37.7 ml 28.14 ml/m  RA Volume:   27.40 ml  20.45 ml/m LA Vol (A4C):   25.3 ml 18.88 ml/m LA Biplane Vol: 33.9 ml 25.30 ml/m  AORTIC VALVE AV Area (Vmax):    2.86 cm AV Area (Vmean):   2.60 cm AV Area (VTI):     2.84 cm AV Vmax:           103.69 cm/s AV Vmean:          74.930 cm/s AV VTI:            0.161 m AV Peak Grad:      4.3 mmHg AV Mean Grad:      2.5 mmHg LVOT Vmax:         94.33 cm/s LVOT Vmean:        62.000 cm/s LVOT VTI:          0.145 m LVOT/AV VTI ratio: 0.90 AI PHT:            507 msec  AORTA Ao Root diam: 3.20 cm Ao Asc diam:  3.50 cm MITRAL VALVE               TRICUSPID VALVE MV Area (PHT): 4.28 cm    TR Peak grad:   11.7 mmHg MV Decel Time: 177 msec    TR Vmax:        171.00 cm/s MV E velocity: 43.23 cm/s MV A velocity: 63.73 cm/s  SHUNTS MV E/A ratio:  0.68        Systemic VTI:  0.15 m                             Systemic Diam: 2.00 cm Yvonne Kendall MD Electronically signed by Yvonne Kendall MD Signature Date/Time: 03/06/2023/8:13:55 AM    Final    MR Brain W and Wo Contrast  Result Date: 03/05/2023 CLINICAL DATA:  Mental status change, unknown cause EXAM: MRI HEAD WITHOUT AND WITH CONTRAST TECHNIQUE: Multiplanar, multiecho pulse sequences of the brain and surrounding structures were obtained without and with intravenous contrast. CONTRAST:  5mL GADAVIST GADOBUTROL 1 MMOL/ML IV SOLN COMPARISON:  Same day CT head FINDINGS: Brain: Negative for an acute infarct. No hydrocephalus. No extra-axial fluid collection. Small microhemorrhage in the right lentiform nucleus. Chronic left cerebellar infarct. There is sequela mild-to-moderate chronic microvascular ischemic change. There are multiple lesions, including- -1.0 x 0.7 cm contrast-enhancing lesion in the right frontal lobe with surrounding vasogenic edema (series 18, image 108) - 0.8 x 0.7 cm contrast-enhancing lesions in in the left occipital lobe with surrounding vasogenic edema (series 18, image 69). -1.2 x 1.1 cm contrast-enhancing lesion in the superior right cerebellum mild surrounding vasogenic edema (series 18, image 50) -0.6 x 0.4 cm dural-based contrast-enhancing lesion along the left frontal convexity (series 93, image 176). Vascular: Normal flow voids. Skull and upper cervical spine: Normal marrow signal. Sinuses/Orbits: No middle ear or mastoid effusion. Mild mucosal thickening bilateral ethmoid air cells. Orbits are unremarkable. Other: None. IMPRESSION: 1. Multiple contrast-enhancing lesions in the right frontal lobe, left occipital lobe, and superior right cerebellum with surrounding vasogenic edema. 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 2. Small dural-based contrast-enhancing lesion along the left frontal convexity could represent a small meningimoa, but metastatic disease is not excluded. Electronically Signed   By: Lorenza Cambridge  M.D.   On: 03/05/2023 16:48   CT CHEST ABDOMEN PELVIS W CONTRAST  Result Date: 03/05/2023 CLINICAL DATA:  Brain mass. Assess for occult malignancy. * Tracking Code: BO * EXAM: CT CHEST, ABDOMEN, AND PELVIS WITH CONTRAST TECHNIQUE: Multidetector CT imaging of the chest, abdomen and pelvis was performed following the standard protocol during bolus administration of intravenous contrast. RADIATION DOSE REDUCTION: This exam was performed according to the departmental dose-optimization program which includes automated exposure control, adjustment of the mA and/or kV according to patient size and/or use of iterative reconstruction technique. CONTRAST:  75mL OMNIPAQUE IOHEXOL 300 MG/ML  SOLN COMPARISON:  Chest CT without contrast 04/28/2015. X-ray chest earlier 03/05/2023 FINDINGS: CT CHEST FINDINGS Cardiovascular: Very large pericardial effusion. The heart itself is nonenlarged. Significant coronary artery calcifications. The thoracic aorta has a normal course and caliber with mild atherosclerotic calcified plaque. Mediastinum/Nodes: Normal caliber thoracic esophagus. Preserved thyroid gland. No specific abnormal lymph node enlargement seen including in the axillary regions, hilum. Prominent right paratracheal node on series 2, image 18 measures 18 by 7 mm. This could be 2 adjacent nodes. Subcarinal node series 2, image 29 measures 2.7 by 1.1 cm, mildly enlarged. Lungs/Pleura: Moderate right and small left effusion. There are some loculated components on the left with fluid tracking along the interlobar fissure. Adjacent parenchymal opacities. Atelectasis versus infiltrate. There is a opacity specifically in the lingula as well. Scattered ground-glass elsewhere in the visualized lungs. No pneumothorax. No obvious mass. Significant breathing motion throughout the examination. A subtle mass lesion could be obscured by the lung opacity in the effusions. Follow up study could be performed after improvement of the  effusions. Musculoskeletal: Mild degenerative changes along the spine. CT ABDOMEN PELVIS FINDINGS Hepatobiliary: Contrast exam in the abdomen is more in the late arterial phase and portal venous phase. Patent portal vein. Gallbladder is present. Small cysts seen in segment 4 of the liver. Pancreas: Unremarkable. No pancreatic ductal dilatation or surrounding inflammatory changes. Spleen: Normal in size without focal abnormality. Adrenals/Urinary Tract: Slight nonspecific thickening of the adrenal glands but unchanged from previous. No enhancing renal mass or collecting system dilatation. There are some small low-attenuation lesions in the left kidney which are too small to completely characterize but likely benign cysts, Bosniak 2 lesions. No specific imaging follow-up. The ureters have normal course and caliber extending down to the bladder. Diffuse wall thickening of the urinary bladder. Stomach/Bowel: On this non oral contrast exam, large bowel has a normal caliber. Redundant course of the sigmoid colon. There are some areas of mild wall thickening along loops of bowel, nonspecific. The stomach is mildly distended with fluid. Mild fold thickening suggested small bowel is nondilated. Vascular/Lymphatic: Normal caliber aorta and IVC with moderate calcified plaque along the aorta and iliac vessels. There are several enlarged nodes identified in the retroperitoneum and upper abdomen. Example aortocaval node posteriorly on series 2, image 64 measures 17 by 13 mm. No next of the celiac series 2, image 59 measures 16 by 14 mm. Left para-node anteriorly on series 2, image 68 measures 17 by 8 mm. A few small retrocrural nodes are also identified. Reproductive: Enlarged heterogeneous prostate. Please correlate with patient's PSA. Other: Anasarca.  Mesenteric haziness.  Trace ascites.  No free air Musculoskeletal: Mild degenerative changes along the spine. IMPRESSION: Large pericardial effusion. Please correlate with and  new symptoms including for tamponade. Moderate right and small left effusion with some loculated components on the left. Adjacent parenchymal opacities. Atelectasis versus infiltrate. Recommend follow up after clearance of the effusions. Few borderline enlarged lymph nodes identified including mediastinum, upper retroperitoneum.  Enlarged prostate with mass effect along the bladder with bladder wall thickening. Please correlate with patient's PSA. No bowel obstruction or free air. There is scattered mesenteric haziness with trace ascites. There are some small areas of wall thickening along the colon but this could simply relate to the other signs of edema. Electronically Signed   By: Karen Kays M.D.   On: 03/05/2023 15:03   DG Chest Port 1 View  Result Date: 03/05/2023 CLINICAL DATA:  Shortness of breath EXAM: PORTABLE CHEST 1 VIEW COMPARISON:  07/27/2018 FINDINGS: Cardiac silhouette is enlarged with diffuse interstitial opacities throughout both lungs compatible with edema related to CHF. Basilar atelectasis noted, worse on the left. Difficult to exclude small effusions. Also left lower lobe retrocardiac opacity obscures the left hemidiaphragm concerning for possible superimposed pneumonia. No pneumothorax. Trachea midline. Aorta atherosclerotic. No acute osseous finding. IMPRESSION: 1. Cardiomegaly with diffuse interstitial edema pattern. 2. Left lower lobe airspace consolidation concerning for superimposed pneumonia. 3. Suspect small effusions. Electronically Signed   By: Judie Petit.  Shick M.D.   On: 03/05/2023 13:20   CT Head Wo Contrast  Result Date: 03/05/2023 CLINICAL DATA:  Mental status change of unknown cause. Memory disturbance. EXAM: CT HEAD WITHOUT CONTRAST TECHNIQUE: Contiguous axial images were obtained from the base of the skull through the vertex without intravenous contrast. RADIATION DOSE REDUCTION: This exam was performed according to the departmental dose-optimization program which includes  automated exposure control, adjustment of the mA and/or kV according to patient size and/or use of iterative reconstruction technique. COMPARISON:  07/27/2018 FINDINGS: Brain: No focal abnormality affects the brainstem. Question mild edema and mass effect in the inferior right cerebellum. 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 There is abnormal vasogenic edema within the right posterior frontal region worrisome for an underlying mass lesion. In the left hemisphere, there is abnormal vasogenic edema in the occipital lobe worrisome for the presence of an occult mass lesion. No visible hemorrhage. Hydrocephalus or extra-axial collection. Vascular: There is atherosclerotic calcification of the major vessels at the base of the brain. Skull: No calvarial abnormality. Sinuses/Orbits: Clear/normal Other: None IMPRESSION: 1. Abnormal vasogenic edema in the right posterior frontal region and in the left occipital lobe worrisome for the presence of occult mass lesions. Question mild edema and mass effect in the inferior right cerebellum. No visible hemorrhage. MRI with and without contrast is recommended for further evaluation. 2. 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 3. Atherosclerotic calcification of the major vessels at the base of the brain. Electronically Signed   By: Paulina Fusi M.D.   On: 03/05/2023 12:52     TELEMETRY reviewed by me (LT) 03/06/2023 : Sinus rhythm to sinus tachycardia rate 90s-low 100s with some premature atrial contractions  EKG reviewed by me: Sinus rhythm with low voltage  Data reviewed by me (LT) 03/06/2023: ED note, admission H&P, oncology note last 24h vitals tele labs imaging I/O   Principal Problem:   Acute encephalopathy Active Problems:   Congestive heart failure (HCC)   Chronic obstructive pulmonary disease (HCC)   Acute hyponatremia   Pericardial effusion   Vasogenic edema (HCC)   Brain mass   Pleural effusion, bilateral   Alcohol use disorder    BPH (benign prostatic hyperplasia)   Elevated LFTs   Pneumonia of left lower lobe due to infectious organism    ASSESSMENT AND PLAN:  Cody [name] is a 77yo [place] male with a PMH of HFpEF, alcohol use disorder, ongoing tobacco abuse, HTN who presented  to [facility] ED 03/05/2023 with altered mental status (speaking nonsensically and having difficulty with his memory), generalized weakness, shortness of breath, and left-sided chest discomfort.  He was admitted to stepdown and head CT obtained showed abnormal vasogenic edema in the right posterior frontal region and left occipital lobe worrisome for mass lesions.  CT chest abdomen pelvis revealed a large pericardial effusion, confirmed by echo this morning to be a large circumferential pericardial effusion with findings consistent with cardiac tamponade.  # Altered mental status # Vasogenic edema with c/f brain metastasis Presents with 1 week of nonsensical speech and memory difficulty per family, head CT and brain MRI concerning for vasogenic edema and brain mets with unknown primary although, CT chest abdomen pelvis suspicious for prostate enlargement.  Started on steroids and 3% normal saline per nephrology.  Agree with current therapy per primary, oncology, and nephrology.  # Cardiac tamponade Initial CT chest abdomen pelvis showed a large pericardial effusion, with echo confirming large circumferential pericardial effusion with findings concerning for cardiac tamponade.  On exam he is hypertensive and currently hemodynamically stable with JVD.  EKG with low voltage QRS complexes.  Discussed with Dr. Darrold Junker (on call [facility] MD) who recommends urgent pericardiocentesis and pleural fluid analysis.  The patient's family is in agreement for the urgent procedure and confirm they would want everything done in order to save his life. -Will plan for urgent pericardiocentesis with pericardial fluid analysis with Dr. Marcina Millard.  Written consent will be  obtained.  # Alcohol use disorder Per family, the patient drinks 6-7 beers per day and had not had alcohol 2 days prior to admission because he was feeling poorly.  CIWA protocol per primary.  Complete cessation recommended  # Tobacco abuse Complete cessation advised.  This patient's plan of care was discussed and created with Dr. Darrold Junker and he is in agreement.  Signed: Rebeca Allegra , PA-C 03/06/2023, 9:08 AM [facility]

## 2023-03-06 NOTE — Progress Notes (Addendum)
[facility] Progress Note  Patient: Cody [name]    ZOX:[id number]  DOA: 03/05/2023     Date of Service: the patient was seen and examined on 03/06/2023  Chief Complaint  Patient presents with   Memory Loss   Brief hospital course: Cody [name] is a 77 y.o. male with medical history significant of hypertension, HFpEF, alcohol use disorder, BPH, who presents to the ED due to altered mental status.  Patient's son stated that he is having shortness of breath for past 1 week productive cough.  For past 2 to 3 days patient is talking randomly which does not make any sense and having memory issues.  Patient drinks 10 beers per day and active smoker. ED w/up: VS stable Hyponatremia sodium 117, mild acidosis CO2 18, mild hyperglycemia blood glucose 154, elevated AST and ALT, leukocytosis WBC 13.2 CXR:  Cardiomegaly with diffuse interstitial edema pattern. Left lower lobe airspace consolidation concerning for superimposed pneumonia. Suspect small effusions. CT head:  old CVA and vasogenic edema MRI brain: . Multiple contrast-enhancing lesions in the right frontal lobe, left occipital lobe, and superior right cerebellum with surrounding vasogenic edema. 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 Small dural-based contrast-enhancing lesion along the left frontal convexity could represent a small meningimoa, but metastatic disease is not excluded. CT Chest/a/p: Large pericardial effusion. Please correlate with and new symptoms including for tamponade.   Moderate right and small left effusion with some loculated components on the left. Adjacent parenchymal opacities. Atelectasis versus infiltrate. Recommend follow up after clearance of the effusions. Few borderline enlarged lymph nodes identified including mediastinum, upper retroperitoneum. Enlarged prostate with mass effect along the bladder with bladder wall thickening. Please correlate with patient's PSA. No bowel obstruction or free air.  There is scattered mesenteric haziness with trace ascites. There are some small areas of wall thickening along the colon but this could simply relate to the other signs of edema.  Assessment and Plan:  # Acute metabolic encephalopathy most likely due to metastatic brain lesions and vasogenic edema Symptoms improving, currently patient is back to his baseline  # Metastatic brain lesion with vasogenic edema CT scan and MRI brain reviewed S/p Decadron 8 mg IV x 1 dose, followed by Decadron 4 mg p.o. daily, Continue Keppra 500 mg IV twice daily for seizure prophylaxis Neurosurgery consulted, recommended no intervention at this time but if there is no diagnosis then brain biopsy can be done next week.   # Pericardial effusion CT chest shows large pericardial effusion 2D echocardiogram showed large pericardial fusion with possible tamponade Cardiology was consulted, s/p pericardiocentesis 1.1 liters bloody fluid was tapped and sample was sent for cytology Pericardial catheter intact, may need drainage again tomorrow a.m. Cardiology consult appreciated Patient feels significant improvement in the shortness of breath   # Acute onset A-fib with controlled ventricular rate, developed after pericardiocentesis Continue monitor on telemetry Cardiology following, no need of anticoagulation at this time   # Bilateral pleural effusion, right >left most likely due to metastatic disease Unknown primary Patient got pericardiocentesis done today so we will postpone thoracentesis CT chest/AP reviewed as above Oncologist consulted, recommended therapeutic thoracentesis, sample for cytology cell count and other routine labs. Patient may have underlying lung cancer due to history of smoking.   # Isotonic hyponatremia Serum osmolality 276 at lower end Hyponatremia most likely secondary to alcohol use disorder and possible SIADH due to underlying malignancy Sodium level 117 on admission Continue 3%  saline, monitor serum sodium level closely Nephrology consulted  for further recommendation   # History of gout, home meds colchicine and allopurinol, seems patient is noncompliant. Uric acid 9.7, elevated Started colchicine 0.6 mg p.o. daily Hold allopurinol for now   # CAD, HTN and chronic diastolic CHF Patient was on aspirin, Coreg, Entresto, statin, med rec shows patient is not taking medication seems noncompliant. Continue to monitor BP and titrate medications accordingly   # COPD, possible underlying pneumonia Continue empiric antibiotics ceftriaxone azithromycin Continue Decadron Continue DuoNeb as needed   # Alcohol use disorder Long-term history of alcohol use disorder that is currently active.  No evidence of withdrawal on exam at this time. - CIWA protocol with Ativan as needed - TOC consultation for resources - Daily folic acid, thiamine and multivitamin  # BPH and urinary retention, Foley catheter was inserted on 5/8, 620 mL urine retention as per bladder scan Continue Flomax and finasteride  # Elevated LFTs most likely secondary to alcohol use disorder Monitor LFTs   Body mass index is 19.38 kg/m.  Interventions:       Diet: NPO for procedure, will resume diet  DVT Prophylaxis: SCD, pharmacological prophylaxis contraindicated due to procedure brain mets/vasogenic edema    Advance goals of care discussion: Full code  Family Communication: family was present at bedside, at the time of interview.  The pt provided permission to discuss medical plan with the family. Opportunity was given to ask question and all questions were answered satisfactorily.   Disposition:  Pt is from Home, admitted with pericardial fusion, bilateral pleural effusion, metastatic disease, brain mets with vasogenic edem, which precludes a safe discharge. Discharge to Home, when stable, may need 5 to 7 days to improve..  Subjective: No significant events overnight, patient was seen  after pericardiocentesis, he feels a lot of improvement, denies any shortness of breath, no chest pain or palpitations.  Patient was resting comfortably.  Patient is AOx3, altered mental status improved, currently back to his baseline.  Physical Exam: General: NAD, lying comfortably Appear in no distress, affect appropriate Eyes: PERRLA ENT: Oral Mucosa Clear, moist  Neck: no JVD,  Cardiovascular: Irregular rhythm, no Murmur,  Respiratory: Good air entry bilaterally, bibasilar crackles, no wheezing appreciated.   Abdomen: Bowel Sound present, Soft and no tenderness,  Skin: no rashes Extremities: no Pedal edema, no calf tenderness Neurologic: without any new focal findings Gait not checked due to patient safety concerns  Vitals:   03/06/23 1500 03/06/23 1515 03/06/23 1545 03/06/23 1600  BP: 118/60 106/75 (!) 99/57 (!) 117/59  Pulse: (!) 124 77 (!) 57 92  Resp: 19 18 17 16   Temp:    97.8 F (36.6 C)  TempSrc:    Axillary  SpO2: 93% 91% 95% 100%  Weight:      Height:        Intake/Output Summary (Last 24 hours) at 03/06/2023 1637 Last data filed at 03/06/2023 1606 Gross per 24 hour  Intake 1307.68 ml  Output 350 ml  Net 957.68 ml   Filed Weights   03/05/23 1109 03/06/23 0000 03/06/23 0500  Weight: 45.4 kg 45 kg 45 kg    Data Reviewed: I have personally reviewed and interpreted daily labs, tele strips, imagings as discussed above. I reviewed all nursing notes, pharmacy notes, vitals, pertinent old records I have discussed plan of care as described above with RN and patient/family.  CBC: Recent Labs  Lab 03/05/23 1112 03/06/23 0258  WBC 13.2* 11.3*  NEUTROABS  --  9.2*  HGB 10.9* 10.9*  HCT  32.8* 32.2*  MCV 93.4 91.5  PLT 357 309   Basic Metabolic Panel: Recent Labs  Lab 03/05/23 1112 03/05/23 1656 03/05/23 2213 03/06/23 0258 03/06/23 0849 03/06/23 1455  NA 117* 118* 123* 124* 125* 126*  K 4.9  --   --  5.0  --   --   CL 87*  --   --  95*  --   --   CO2  18*  --   --  17*  --   --   GLUCOSE 154*  --   --  129*  --   --   BUN 39*  --   --  42*  --   --   CREATININE 1.18  --   --  0.99  --   --   CALCIUM 8.5*  --   --  8.5*  --   --   MG  --   --   --   --  2.5*  --   PHOS  --   --   --   --  4.3  --     Studies: CARDIAC CATHETERIZATION  Result Date: 03/06/2023 Successful pericardiocentesis with placement of pigtail catheter   ECHOCARDIOGRAM COMPLETE  Result Date: 03/06/2023    ECHOCARDIOGRAM REPORT   Patient Name:   Cody [name]    Date of Exam: 03/05/2023 Medical Rec #:  [id number]  Height:       57.0 in Accession #:    [id number] Weight:       100.0 lb Date of Birth:  Nov 04, 1945 BSA:          1.340 m Patient Age:    76 years   BP:           123/76 mmHg Patient Gender: M          HR:           74 bpm. Exam Location:  [facility] Procedure: 2D Echo, Cardiac Doppler and Color Doppler Indications:     I31.3 Pericardial Effusion  History:         Patient has prior history of Echocardiogram examinations, most                  recent 07/27/2018. CHF; Risk Factors:Hypertension, Current                  Smoker and Alcohol Abuse.  Sonographer:     Daphine Deutscher RDCS Referring Phys:  [id number] Verdene Lennert Diagnosing Phys: Yvonne Kendall MD IMPRESSIONS  1. Left ventricular ejection fraction, by estimation, is 55 to 60%. The left ventricle has normal function. The left ventricle has no regional wall motion abnormalities. Left ventricular diastolic parameters are consistent with Grade I diastolic dysfunction (impaired relaxation).  2. Right ventricular systolic function is mildly reduced. The right ventricular size is normal. There is normal pulmonary artery systolic pressure.  3. Large pericardial effusion. The pericardial effusion is circumferential. Findings are consistent with cardiac tamponade.  4. The mitral valve is normal in structure. Mild mitral valve regurgitation.  5. Tricuspid valve regurgitation is mild to moderate.  6. The aortic valve is tricuspid.  There is mild calcification of the aortic valve. There is mild thickening of the aortic valve. Aortic valve regurgitation is mild to moderate.  7. The inferior vena cava is dilated in size with <50% respiratory variability, suggesting right atrial pressure of 15 mmHg. Conclusion(s)/Recommendation(s): Findings were conveyed to Dr. Gillis Santa at 8:02 AM on 03/06/2023 by Dr. Cristal Deer End. FINDINGS  Left Ventricle: Left ventricular ejection fraction, by estimation, is 55 to 60%. The left ventricle has normal function. The left ventricle has no regional wall motion abnormalities. The left ventricular internal cavity size was normal in size. There is  no left ventricular hypertrophy. Left ventricular diastolic parameters are consistent with Grade I diastolic dysfunction (impaired relaxation). Right Ventricle: The right ventricular size is normal. No increase in right ventricular wall thickness. Right ventricular systolic function is mildly reduced. There is normal pulmonary artery systolic pressure. The tricuspid regurgitant velocity is 1.71 m/s, and with an assumed right atrial pressure of 15 mmHg, the estimated right ventricular systolic pressure is 26.7 mmHg. Left Atrium: Left atrial size was normal in size. Right Atrium: Right atrial size was normal in size. Pericardium: A large pericardial effusion is present. The pericardial effusion is circumferential. The pericardial effusion appears to contain fibrous material. There is diastolic collapse of the right ventricular free wall and excessive respiratory variation in the mitral valve spectral Doppler velocities. There is evidence of cardiac tamponade. Mitral Valve: The mitral valve is normal in structure. There is mild thickening of the mitral valve leaflet(s). Mild mitral valve regurgitation. Tricuspid Valve: The tricuspid valve is normal in structure. Tricuspid valve regurgitation is mild to moderate. Aortic Valve: The aortic valve is tricuspid. There is mild  calcification of the aortic valve. There is mild thickening of the aortic valve. Aortic valve regurgitation is mild to moderate. Aortic regurgitation PHT measures 507 msec. Aortic valve mean gradient measures 2.5 mmHg. Aortic valve peak gradient measures 4.3 mmHg. Aortic valve area, by VTI measures 2.84 cm. Pulmonic Valve: The pulmonic valve was normal in structure. Pulmonic valve regurgitation is trivial. No evidence of pulmonic stenosis. Aorta: The aortic root and ascending aorta are structurally normal, with no evidence of dilitation. Pulmonary Artery: The pulmonary artery is of normal size. Venous: The inferior vena cava is dilated in size with less than 50% respiratory variability, suggesting right atrial pressure of 15 mmHg. IAS/Shunts: The interatrial septum was not well visualized.  LEFT VENTRICLE PLAX 2D LVIDd:         3.50 cm   Diastology LVIDs:         2.50 cm   LV e' medial:    4.46 cm/s LV PW:         0.90 cm   LV E/e' medial:  9.7 LV IVS:        0.80 cm   LV e' lateral:   6.31 cm/s LVOT diam:     2.00 cm   LV E/e' lateral: 6.9 LV SV:         46 LV SV Index:   34 LVOT Area:     3.14 cm  RIGHT VENTRICLE RV Basal diam:  3.10 cm RV S prime:     7.29 cm/s TAPSE (M-mode): 1.3 cm LEFT ATRIUM             Index        RIGHT ATRIUM           Index LA diam:        3.10 cm 2.31 cm/m   RA Area:     10.70 cm LA Vol (A2C):   37.7 ml 28.14 ml/m  RA Volume:   27.40 ml  20.45 ml/m LA Vol (A4C):   25.3 ml 18.88 ml/m LA Biplane Vol: 33.9 ml 25.30 ml/m  AORTIC VALVE AV Area (Vmax):    2.86 cm AV Area (Vmean):   2.60 cm AV Area (  VTI):     2.84 cm AV Vmax:           103.69 cm/s AV Vmean:          74.930 cm/s AV VTI:            0.161 m AV Peak Grad:      4.3 mmHg AV Mean Grad:      2.5 mmHg LVOT Vmax:         94.33 cm/s LVOT Vmean:        62.000 cm/s LVOT VTI:          0.145 m LVOT/AV VTI ratio: 0.90 AI PHT:            507 msec  AORTA Ao Root diam: 3.20 cm Ao Asc diam:  3.50 cm MITRAL VALVE               TRICUSPID  VALVE MV Area (PHT): 4.28 cm    TR Peak grad:   11.7 mmHg MV Decel Time: 177 msec    TR Vmax:        171.00 cm/s MV E velocity: 43.23 cm/s MV A velocity: 63.73 cm/s  SHUNTS MV E/A ratio:  0.68        Systemic VTI:  0.15 m                            Systemic Diam: 2.00 cm Yvonne Kendall MD Electronically signed by Yvonne Kendall MD Signature Date/Time: 03/06/2023/8:13:55 AM    Final     Scheduled Meds:  Chlorhexidine Gluconate Cloth  6 each Topical Daily   dexamethasone  4 mg Oral Daily   folic acid  1 mg Oral Daily   multivitamin with minerals  1 tablet Oral Daily   sodium chloride flush  3 mL Intravenous Q12H   sodium chloride flush  3 mL Intravenous Q12H   tamsulosin  0.4 mg Oral Daily   thiamine  100 mg Oral Daily   Or   thiamine  100 mg Intravenous Daily   Continuous Infusions:  sodium chloride     [START ON 03/07/2023] sodium chloride     Followed by   Melene Muller ON 03/07/2023] sodium chloride     azithromycin (ZITHROMAX) 500 mg in sodium chloride 0.9 % 250 mL IVPB 250 mL/hr at 03/06/23 1606   cefTRIAXone (ROCEPHIN)  IV Stopped (03/06/23 1531)   levETIRAcetam Stopped (03/06/23 0540)   sodium chloride (hypertonic) 25 mL/hr at 03/06/23 1606   PRN Meds: sodium chloride, acetaminophen **OR** acetaminophen, LORazepam **OR** LORazepam, ondansetron **OR** ondansetron (ZOFRAN) IV, sodium chloride flush  Time spent: 55 minutes  Author: Gillis Santa. MD [facility] 03/06/2023 4:37 PM  To reach On-call, see care teams to locate the attending and reach out to them via [email or web address]. If 7PM-7AM, please contact night-coverage If you still have difficulty reaching the attending provider, please page the [facility] (Director on Call) for [facility] on amion for assistance.

## 2023-03-07 ENCOUNTER — Ambulatory Visit: Payer: Medicare Other

## 2023-03-07 ENCOUNTER — Inpatient Hospital Stay
Admit: 2023-03-07 | Discharge: 2023-03-07 | Disposition: A | Discharge status: Home / Self Care | Payer: Medicare Other | Attending: Student | Admitting: Student

## 2023-03-07 ENCOUNTER — Inpatient Hospital Stay: Payer: Medicare Other

## 2023-03-07 DIAGNOSIS — J90 Pleural effusion, not elsewhere classified: Secondary | ICD-10-CM | POA: Diagnosis not present

## 2023-03-07 DIAGNOSIS — I509 Heart failure, unspecified: Secondary | ICD-10-CM | POA: Diagnosis not present

## 2023-03-07 DIAGNOSIS — G936 Cerebral edema: Secondary | ICD-10-CM | POA: Diagnosis not present

## 2023-03-07 DIAGNOSIS — G9389 Other specified disorders of brain: Secondary | ICD-10-CM | POA: Diagnosis not present

## 2023-03-07 DIAGNOSIS — R4182 Altered mental status, unspecified: Secondary | ICD-10-CM | POA: Diagnosis not present

## 2023-03-07 DIAGNOSIS — Z7189 Other specified counseling: Secondary | ICD-10-CM | POA: Diagnosis not present

## 2023-03-07 DIAGNOSIS — G9340 Encephalopathy, unspecified: Secondary | ICD-10-CM | POA: Diagnosis not present

## 2023-03-07 DIAGNOSIS — F1090 Alcohol use, unspecified, uncomplicated: Secondary | ICD-10-CM | POA: Diagnosis not present

## 2023-03-07 DIAGNOSIS — E871 Hypo-osmolality and hyponatremia: Secondary | ICD-10-CM | POA: Diagnosis not present

## 2023-03-07 LAB — PROTEIN, PLEURAL OR PERITONEAL FLUID: Total protein, fluid: <3 g/dL

## 2023-03-07 LAB — BASIC METABOLIC PANEL
Anion gap: 10 (ref 5–15)
BUN: 43 mg/dL — ABNORMAL HIGH (ref 8–23)
CO2: 18 mmol/L — ABNORMAL LOW (ref 22–32)
Calcium: 8 mg/dL — ABNORMAL LOW (ref 8.9–10.3)
Chloride: 102 mmol/L (ref 98–111)
Creatinine, Ser: 0.89 mg/dL (ref 0.61–1.24)
GFR, Estimated: >60 mL/min (ref >60)
Glucose, Bld: 100 mg/dL — ABNORMAL HIGH (ref 70–99)
Potassium: 4.5 mmol/L (ref 3.5–5.1)
Sodium: 130 mmol/L — ABNORMAL LOW (ref 135–145)

## 2023-03-07 LAB — CBC
HCT: 31 % — ABNORMAL LOW (ref 39.0–52.0)
Hemoglobin: 10 g/dL — ABNORMAL LOW (ref 13.0–17.0)
MCH: 31.3 pg (ref 26.0–34.0)
MCHC: 32.3 g/dL (ref 30.0–36.0)
MCV: 96.9 fL (ref 80.0–100.0)
Platelets: 274 10*3/uL (ref 150–400)
RBC: 3.2 MIL/uL — ABNORMAL LOW (ref 4.22–5.81)
RDW: 13.4 % (ref 11.5–15.5)
WBC: 14.2 10*3/uL — ABNORMAL HIGH (ref 4.0–10.5)
nRBC: 0.1 % (ref 0.0–0.2)

## 2023-03-07 LAB — HEPATIC FUNCTION PANEL
ALT: 142 U/L — ABNORMAL HIGH (ref 0–44)
AST: 119 U/L — ABNORMAL HIGH (ref 15–41)
Albumin: 2.3 g/dL — ABNORMAL LOW (ref 3.5–5.0)
Alkaline Phosphatase: 59 U/L (ref 38–126)
Bilirubin, Direct: 0.1 mg/dL (ref 0.0–0.2)
Indirect Bilirubin: 0.6 mg/dL (ref 0.3–0.9)
Total Bilirubin: 0.7 mg/dL (ref 0.3–1.2)
Total Protein: 5.5 g/dL — ABNORMAL LOW (ref 6.5–8.1)

## 2023-03-07 LAB — ECHOCARDIOGRAM LIMITED
Area-P 1/2: 4.99 cm2
Height: 60 in
P 1/2 time: 436 msec
S' Lateral: 2.8 cm
Weight: 1724.88 oz

## 2023-03-07 LAB — AMYLASE, PLEURAL OR PERITONEAL FLUID: Amylase, Fluid: 7 U/L

## 2023-03-07 LAB — MAGNESIUM: Magnesium: 2.5 mg/dL — ABNORMAL HIGH (ref 1.7–2.4)

## 2023-03-07 LAB — SODIUM
Sodium: 129 mmol/L — ABNORMAL LOW (ref 135–145)
Sodium: 131 mmol/L — ABNORMAL LOW (ref 135–145)
Sodium: 131 mmol/L — ABNORMAL LOW (ref 135–145)

## 2023-03-07 LAB — BLOOD GAS, ARTERIAL
Acid-base deficit: 2.6 mmol/L — ABNORMAL HIGH (ref 0.0–2.0)
Bicarbonate: 20.2 mmol/L (ref 20.0–28.0)
O2 Content: 4 L/min
O2 Saturation: 98.2 %
Patient temperature: 37
pCO2 arterial: 29 mmHg — ABNORMAL LOW (ref 32–48)
pH, Arterial: 7.45 (ref 7.35–7.45)
pO2, Arterial: 87 mmHg (ref 83–108)

## 2023-03-07 LAB — BODY FLUID CELL COUNT WITH DIFFERENTIAL
Eos, Fluid: 0 %
Lymphs, Fluid: 49 %
Monocyte-Macrophage-Serous Fluid: 26 %
Neutrophil Count, Fluid: 25 %
Total Nucleated Cell Count, Fluid: 588 cu mm

## 2023-03-07 LAB — LACTATE DEHYDROGENASE, PLEURAL OR PERITONEAL FLUID: LD, Fluid: 114 U/L — ABNORMAL HIGH (ref 3–23)

## 2023-03-07 LAB — PHOSPHORUS: Phosphorus: 4.5 mg/dL (ref 2.5–4.6)

## 2023-03-07 LAB — GLUCOSE, PLEURAL OR PERITONEAL FLUID: Glucose, Fluid: 94 mg/dL

## 2023-03-07 LAB — LACTATE DEHYDROGENASE: LDH: 282 U/L — ABNORMAL HIGH (ref 98–192)

## 2023-03-07 LAB — ALBUMIN: Albumin: 2.4 g/dL — ABNORMAL LOW (ref 3.5–5.0)

## 2023-03-07 LAB — CULTURE, BLOOD (ROUTINE X 2)

## 2023-03-07 LAB — STREP PNEUMONIAE URINARY ANTIGEN: Strep Pneumo Urinary Antigen: NEGATIVE

## 2023-03-07 LAB — HEMATOCRIT: HCT: 32.6 % — ABNORMAL LOW (ref 39.0–52.0)

## 2023-03-07 LAB — PROTEIN, TOTAL: Total Protein: 5.8 g/dL — ABNORMAL LOW (ref 6.5–8.1)

## 2023-03-07 MED ORDER — SODIUM CHLORIDE 0.9 % IV SOLN
100.0000 mg | Freq: Two times a day (BID) | INTRAVENOUS | Status: DC
  Administered 2023-03-07 – 2023-03-11 (×9): 100 mg via INTRAVENOUS
  Filled 2023-03-07 (×10): qty 100

## 2023-03-07 MED ORDER — SODIUM BICARBONATE 650 MG PO TABS
650.0000 mg | ORAL_TABLET | Freq: Three times a day (TID) | ORAL | Status: DC
  Administered 2023-03-08: 650 mg via ORAL
  Filled 2023-03-07 (×4): qty 1

## 2023-03-07 NOTE — Progress Notes (Signed)
Daily Progress Note   Patient Name: Cody [name]       Date: 03/07/2023 DOB: 1946/06/18  Age: 77 y.o. MRN#: [id number] Attending Physician: Gillis Santa, MD Primary Care Physician: Orson Eva, NP Admit Date: 03/05/2023  Reason for Consultation/Follow-up: Establishing goals of care  Subjective: Notes and labs reviewed. In to see patient, he is currently undergoing thoracentesis. Spoke with attending and Oncology in depth. PMT will follow up Monday as cytology is pending amongst other work up and family needs more information to make decisions.     Length of Stay: 2  Current Medications: Scheduled Meds:   Chlorhexidine Gluconate Cloth  6 each Topical Daily   colchicine  0.6 mg Oral Daily   dexamethasone  4 mg Oral Daily   folic acid  1 mg Oral Daily   multivitamin with minerals  1 tablet Oral Daily   sodium bicarbonate  650 mg Oral TID   sodium chloride flush  3 mL Intravenous Q12H   sodium chloride flush  3 mL Intravenous Q12H   tamsulosin  0.4 mg Oral Daily   thiamine  100 mg Oral Daily   Or   thiamine  100 mg Intravenous Daily    Continuous Infusions:  sodium chloride     sodium chloride 1 mL/kg/hr (03/07/23 1150)   cefTRIAXone (ROCEPHIN)  IV Stopped (03/06/23 1531)   doxycycline (VIBRAMYCIN) IV 100 mg (03/07/23 1402)   levETIRAcetam Stopped (03/07/23 0603)   sodium chloride (hypertonic) Stopped (03/06/23 1904)    PRN Meds: sodium chloride, acetaminophen **OR** acetaminophen, ipratropium-albuterol, ondansetron **OR** ondansetron (ZOFRAN) IV, sodium chloride flush  Physical Exam Pulmonary:     Effort: Pulmonary effort is normal.  Neurological:     Mental Status: He is alert.             Vital Signs: BP (!) 101/59   Pulse 71   Temp 97.6 F (36.4 C) (Axillary)    Resp 17   Ht 5' (1.524 m)   Wt 48.9 kg   SpO2 100%   BMI 21.05 kg/m  SpO2: SpO2: 100 % O2 Device: O2 Device: Nasal Cannula O2 Flow Rate: O2 Flow Rate (L/min): 4 L/min  Intake/output summary:  Intake/Output Summary (Last 24 hours) at 03/07/2023 1605 Last data filed at 03/07/2023 1245 Gross per 24 hour  Intake 774.14 ml  Output  1100 ml  Net -325.86 ml   LBM: Last BM Date :  (PTA) Baseline Weight: Weight: 45.4 kg Most recent weight: Weight: 48.9 kg   Patient Active Problem List   Diagnosis Date Noted   Metastasis to brain (HCC) 03/06/2023   Acute encephalopathy 03/05/2023   Acute hyponatremia 03/05/2023   Pericardial effusion 03/05/2023   Vasogenic edema (HCC) 03/05/2023   Brain mass 03/05/2023   Pleural effusion, bilateral 03/05/2023   Alcohol use disorder 03/05/2023   BPH (benign prostatic hyperplasia) 03/05/2023   Elevated LFTs 03/05/2023   Pneumonia of left lower lobe due to infectious organism 03/05/2023   Congestive heart failure (HCC) 12/24/2022   Chronic obstructive pulmonary disease (HCC) 12/24/2022   Mixed hyperlipidemia 12/24/2022   Nicotine dependence with nicotine-induced disorder 12/24/2022   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   Code Status:    Code Status Orders  (From admission, onward)           Start     Ordered   03/05/23 1544  Full code  Continuous       Question:  By:  Answer:  Consent: discussion documented in EHR   03/05/23 1545           Code Status History     Date Active Date Inactive Code Status Order ID Comments User Context   08/25/2018 1408 08/25/2018 1923 Full Code [id number]  Laurier Nancy, MD Inpatient   07/27/2018 0224 07/27/2018 2126 Full Code [id number]  Barbaraann Rondo, MD ED   07/27/2018 0224 07/27/2018 0224 Full  Code [id number]  Barbaraann Rondo, MD ED       Prognosis:  Unable to determine    Thank you for allowing the Palliative Medicine Team to assist in the care of this patient.   Morton Stall, NP  Please contact Palliative Medicine Team phone at [phone] for questions and concerns.

## 2023-03-07 NOTE — Consult Note (Signed)
NAME:  Cody [name], MRN:  [id number], DOB:  1946-02-11, LOS: 2 ADMISSION DATE:  03/05/2023, CONSULTATION DATE:  03/07/2023 REFERRING MD:  Dr. Lucianne Muss, CHIEF COMPLAINT:  Altered Mental Status   Brief Pt Description / Synopsis:  77yo [place] male with a PMH of HFpEF, alcohol use disorder, ongoing tobacco abuse, HTN admitted with Acute Metabolic Encephalopathy due to Severe Hyponatremia & METASTATIC BRAIN MASS with VASOGENIC EDEMA.  Found to have Pericardial Effusion with concern for possible Tamponade,  s/p Pericardiocentesis on 03/06/23.  History of Present Illness:  Cody [name] is a 77yo [place] male with a PMH of HFpEF, alcohol use disorder, ongoing tobacco abuse, HTN who presented to [facility] ED on 03/05/2023 with altered mental status (speaking nonsensically and having difficulty with his memory), generalized weakness, shortness of breath, productive cough, and left-sided chest discomfort.   Patient smokes daily for 57 years.  He also drinks alcohol daily.  Patient lives at home with wife.  He is retired.  He has 11 children with the youngest daughter 71 years old.  He reports that he has lost about 5 pounds.  He has experienced lower back/hip pain recently which has improved.  Denies any swallowing difficulties, fever, chills, night sweats   ED Course: Initial Vital Signs: normotensive at 123/76 with heart rate of 96. He was saturating at 98% on room air. He was afebrile at 98.  Significant Labs:  sodium of 117, creatinine 1.18, he had a baseline  sodium of 142 months ago. LDH is elevated 694, WBC 13.2, hemoglobin 10.9, MCV 93.4, platelet count 257, BNP 99.4 Imaging CT head showed abdominal vasogenic edema in the right posterior frontal region and in the left occipital lobe worrisome for presence of occult mass lesions.  Questionable mild anemia and mass effect in the inferior right cerebellum.  No visible hemorrhage.  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  Atherosclerotic calcification of the  major vessels at the base of brain. Chest x-ray demonstrated cardiomegaly with diffuse interstitial edema and left lower lobe airspace consolidation  MRI brain with and without contrast showed multiple contrast-enhancing lesions in the right frontal lobe, left occipital lobe, superior right cerebellum with surrounding vasogenic edema.  Worrisome for intracranial metastatic disease.  Small dural based contrast-enhancing lesions along the left frontal convexity could represent a small hemangioma.  Metastatic disease is not excluded. CT Chest/abdomen/pelvis: Large pericardial effusion. Please correlate with and new symptoms including for tamponade.   Moderate right and small left effusion with some loculated components on the left. Adjacent parenchymal opacities. Atelectasis versus infiltrate. Recommend follow up after clearance of the effusions. Few borderline enlarged lymph nodes identified including mediastinum, upper retroperitoneum. Enlarged prostate with mass effect along the bladder with bladder wall thickening. Please correlate with patient's PSA. No bowel obstruction or free air. There is scattered mesenteric haziness with trace ascites. There are some small areas of wall thickening along the colon but this could simply relate to the other signs of edema Medications Administered: initiation of 3% saline. Patient started on azithromycin and ceftriaxone for suspected pneumonia.    He was admitted by the Hospitalist for further workup and treatment.  Oncology, Neurosurgery, and Nephrology were consulted.   Please see "Significant Hospital Events" section below for full detailed hospital course.  Pertinent  Medical History   Past Medical History:  Diagnosis Date   Alcohol dependence (HCC)    CHF (congestive heart failure) (HCC)    Hypertension    Tobacco abuse     Micro Data:  5/7: Blood culture x 2>> no growth to date 5/8: Pericardial fluid>> 5/9: Strep pneumo urine antigen>> 5/9:  Legionella urine antigen>>  Antimicrobials:   Anti-infectives (From admission, onward)    Start     Dose/Rate Route Frequency Ordered Stop   03/07/23 1230  doxycycline (VIBRAMYCIN) 100 mg in sodium chloride 0.9 % 250 mL IVPB        100 mg 125 mL/hr over 120 Minutes Intravenous 2 times daily 03/07/23 1144     03/06/23 1500  azithromycin (ZITHROMAX) 500 mg in sodium chloride 0.9 % 250 mL IVPB  Status:  Discontinued        500 mg 250 mL/hr over 60 Minutes Intravenous Every 24 hours 03/05/23 1603 03/07/23 1144   03/06/23 1400  cefTRIAXone (ROCEPHIN) 2 g in sodium chloride 0.9 % 100 mL IVPB        2 g 200 mL/hr over 30 Minutes Intravenous Every 24 hours 03/05/23 1603     03/05/23 1400  cefTRIAXone (ROCEPHIN) 1 g in sodium chloride 0.9 % 100 mL IVPB        1 g 200 mL/hr over 30 Minutes Intravenous  Once 03/05/23 1353 03/05/23 1456   03/05/23 1400  azithromycin (ZITHROMAX) 500 mg in sodium chloride 0.9 % 250 mL IVPB        500 mg 250 mL/hr over 60 Minutes Intravenous  Once 03/05/23 1353 03/05/23 1556        Significant Hospital Events: Including procedures, antibiotic start and stop dates in addition to other pertinent events   5/7: presented to ED , admitted by [facility].  Consults with Neurosurgery, Oncology, and Nephrology.  Started on 3% saline for severe hyponatremia. 5/8: Echo with large pericardial effusion with concern for Cardiac tamponade, underwent urgent pericardiocentesis and pleural fluid analysis. Palliative Care consulted.  Mentation improved 5/9: Received benzos overnight for agitation.  This morning now somnolent, currently protecting his airway.  PCCM consulted in case pt were to need intubation.  Interim History / Subjective:  -Earlier this morning patient was agitated and received benzodiazepines -Currently patient is somnolent but withdraws from pain, currently protecting his airway, hemodynamically stable -PCCM consulted if in the event patient were to need  intubation  Objective   Blood pressure (!) 105/55, pulse 66, temperature 97.6 F (36.4 C), temperature source Axillary, resp. rate 18, height 5' (1.524 m), weight 48.9 kg, SpO2 99 %.        Intake/Output Summary (Last 24 hours) at 03/07/2023 1144 Last data filed at 03/07/2023 5621 Gross per 24 hour  Intake 1297.79 ml  Output 750 ml  Net 547.79 ml   Filed Weights   03/06/23 0000 03/06/23 0500 03/07/23 0500  Weight: 45 kg 45 kg 48.9 kg    Examination: General: Acutely ill-appearing male, laying in bed, lethargic, no acute distress, currently protecting his airway HENT: Atraumatic, normocephalic, neck supple, no JVD Lungs: Coarse breath sounds throughout, even, nonlabored Cardiovascular: Irregular irregular rhythm, no murmurs rubs or gallops Abdomen: Soft, nontender, nondistended, no guarding rebound tenderness, bowel sounds positive x 4 Extremities: Normal bulk and tone, no deformities, no edema Neuro: Somnolent, withdraws to painful stimuli, not following commands, pupils PERRLA GU: Deferred  Resolved Hospital Problem list     Assessment & Plan:   #Pericardial Effusion with concern for possible Tamponade,  s/p Pericardiocentesis on 03/06/23 #Atrial Fibrillation (rate controlled) #Chronic HFpEF  PMHx: CAD, HTN -Continuous cardiac monitoring -Maintain MAP >65 -Cardiology following, appreciate input -Anticoagulation currently on hold -Repeat Echocardiogram pending  #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 Serum osmolality 276, Na was 117 on admission -Monitor I&O's / urinary output -Follow BMP (serial Na+ q4h) -Ensure adequate renal perfusion -Avoid nephrotoxic agents as able -Replace electrolytes as indicated -Continue 3% Hypertonic saline -Goal rate of Na correction 6-8 mEq per 24 hrs -Nephrology following, appreciate input  #Acute Metabolic Encephalopathy, suspect due to Severe Hyponatremia & METASTATIC BRAIN MASS with VASOGENIC EDEMA, exacerbated by Benzodiazepine administration #High risk for development of DT's PMHx: ETOH abuse Neurosurgery was consulted and recommended no intervention at this time; could consider brain biopsy next week if cytology from pericardiocentesis non diagnostic -Treatment of metabolic derangements as outlined above -Provide supportive care -Promote normal sleep wake cycle and family presence -Avoid sedating meds as able -Repeat CT Head negative for acute changes, ABG ok -Obtain EEG -Continue thiamine, folic acid, and MVI -CIWA protocol -Continue Decadron  -Keppra for seizure prophylaxis  -Oncology following, appreciate input  Best Practice (right click and "Reselect all SmartList Selections" daily)   Diet/type: NPO DVT prophylaxis: SCD GI prophylaxis: N/A Lines: N/A Foley:  N/A Code Status:  full code Last date of multidisciplinary goals of care discussion [N/A]  5/9: Patient's family updated at bedside  Labs   CBC: Recent Labs  Lab 03/05/23 1112 03/06/23 0258 03/07/23 0308  WBC 13.2* 11.3* 14.2*  NEUTROABS  --  9.2*  --   HGB 10.9* 10.9* 10.0*  HCT 32.8* 32.2* 31.0*  MCV 93.4 91.5 96.9  PLT 357 309 274    Basic Metabolic Panel: Recent Labs  Lab 03/05/23 1112 03/05/23 1656 03/06/23 0258 03/06/23 0849 03/06/23 1455 03/06/23 1806 03/06/23 2250 03/07/23 0308 03/07/23 0719  NA 117*   < > 124* 125* 126* 130* 129* 130* 129*  K 4.9  --  5.0  --   --   --   --  4.5  --   CL 87*   --  95*  --   --   --   --  102  --   CO2 18*  --  17*  --   --   --   --  18*  --   GLUCOSE 154*  --  129*  --   --   --   --  100*  --   BUN 39*  --  42*  --   --   --   --  43*  --   CREATININE 1.18  --  0.99  --   --   --   --  0.89  --   CALCIUM 8.5*  --  8.5*  --   --   --   --  8.0*  --   MG  --   --   --  2.5*  --   --   --  2.5*  --   PHOS  --   --   --  4.3  --   --   --  4.5  --    < > = values in this interval not displayed.   GFR: Estimated Creatinine  Clearance: 49.6 mL/min (by C-G formula based on SCr of 0.89 mg/dL). Recent Labs  Lab 03/05/23 1112 03/06/23 0258 03/07/23 0308  WBC 13.2* 11.3* 14.2*    Liver Function Tests: Recent Labs  Lab 03/05/23 1112 03/06/23 0258 03/07/23 0308  AST 333* 254* 119*  ALT 214* 217* 142*  ALKPHOS 84 82 59  BILITOT 1.0 0.9 0.7  PROT 7.5 6.7 5.5*  ALBUMIN 3.0* 2.7* 2.3*   No results for input(s): "LIPASE", "AMYLASE" in the last 168 hours. No results for input(s): "AMMONIA" in the last 168 hours.  ABG No results found for: "PHART", "PCO2ART", "PO2ART", "HCO3", "TCO2", "ACIDBASEDEF", "O2SAT"   Coagulation Profile: Recent Labs  Lab 03/06/23 0258  INR 1.5*    Cardiac Enzymes: No results for input(s): "CKTOTAL", "CKMB", "CKMBINDEX", "TROPONINI" in the last 168 hours.  HbA1C: Hgb A1c MFr Bld  Date/Time Value Ref Range Status  12/19/2022 08:41 AM 5.5 4.8 - 5.6 % Final    Comment:             Prediabetes: 5.7 - 6.4          Diabetes: >6.4          Glycemic control for adults with diabetes: <7.0     CBG: Recent Labs  Lab 03/05/23 2257  GLUCAP 119*    Review of Systems:   Unable to assess due to AMS   Past Medical History:  He,  has a past medical history of Alcohol dependence (HCC), CHF (congestive heart failure) (HCC), Hypertension, and Tobacco abuse.   Surgical History:   Past Surgical History:  Procedure Laterality Date   APPENDECTOMY     LEFT HEART CATH AND CORONARY ANGIOGRAPHY N/A 08/25/2018    Procedure: LEFT HEART CATH AND CORONARY ANGIOGRAPHY;  Surgeon: Laurier Nancy, MD;  Location: [facility] INVASIVE CV LAB;  Service: Cardiovascular;  Laterality: N/A;   PERICARDIOCENTESIS N/A 03/06/2023   Procedure: PERICARDIOCENTESIS;  Surgeon: Marcina Millard, MD;  Location: [facility] INVASIVE CV LAB;  Service: Cardiovascular;  Laterality: N/A;     Social History:   reports that he has been smoking cigarettes. He has been smoking an average of .5 packs per day. He has been exposed to tobacco smoke. He has never used smokeless tobacco. He reports current alcohol use of about 35.0 standard drinks of alcohol per week. He reports that he does not use drugs.   Family History:  His family history is not on file.   Allergies Allergies  Allergen Reactions   Shellfish Allergy Hives    Only allergic to crabs     Home Medications  Prior to Admission medications   Medication Sig Start Date End Date Taking? Authorizing Provider  tamsulosin (FLOMAX) 0.4 MG CAPS capsule Take 1 capsule (0.4 mg total) by mouth daily. 07/17/22  Yes Sondra Come, MD  allopurinol (ZYLOPRIM) 100 MG tablet Take 100 mg by mouth daily. Patient not taking: Reported on 12/24/2022 10/17/21   [provider]  aspirin EC 81 MG tablet Take 81 mg by mouth daily. Patient not taking: Reported on 03/05/2023    [provider]  atorvastatin (LIPITOR) 80 MG tablet Take 1 tablet by mouth every evening. Patient not taking: Reported on 03/05/2023 06/26/18   [provider]  carvedilol (COREG) 25 MG tablet Take 25 mg by mouth 2 (two) times daily. Patient not taking: Reported on 03/05/2023 06/26/18   [provider]  colchicine 0.6 MG tablet Take by mouth. 06/05/22 09/03/22  [provider]  ENTRESTO 24-26 MG  Take 1 tablet by mouth 2 (two) times daily. Patient not taking: Reported on 03/05/2023 06/26/18   [provider]  ezetimibe (ZETIA) 10 MG tablet Take 10 mg by mouth daily. Patient not taking:  Reported on 03/05/2023 10/13/21   [provider]  finasteride (PROSCAR) 5 MG tablet Take 1 tablet (5 mg total) by mouth daily. Patient not taking: Reported on 03/05/2023 07/17/22   Sondra Come, MD     Critical care time: 50 minutes     Harlon Ditty, AGACNP-BC [facility] Prefer epic messenger for cross cover needs If after hours, please call E-link

## 2023-03-07 NOTE — Progress Notes (Signed)
Hematology/Oncology Progress note [phone] [phone]     Patient Care Team: Orson Eva, NP as PCP - General (Nurse Practitioner)   Name of the patient: Cody [name]  [id number]  1946/10/02  Date of visit: 03/07/23   INTERVAL HISTORY-    -Status post pericardiocentesis with removal of 1.1 L of bloody fluid.  Pigtail catheter left in place.   -Status post thoracentesis Multiple family members at the bedside.  Patient is lethargic  Allergies  Allergen Reactions   Shellfish Allergy Hives    Only allergic to crabs    Patient Active Problem List   Diagnosis Date Noted   Metastasis to brain (HCC) 03/06/2023   Acute encephalopathy 03/05/2023   Acute hyponatremia 03/05/2023   Pericardial effusion 03/05/2023   Vasogenic edema (HCC) 03/05/2023   Brain mass 03/05/2023   Pleural effusion, bilateral 03/05/2023   Alcohol use disorder 03/05/2023   BPH (benign prostatic hyperplasia) 03/05/2023   Elevated LFTs 03/05/2023   Pneumonia of left lower lobe due to infectious organism 03/05/2023   Congestive heart failure (HCC) 12/24/2022   Chronic obstructive pulmonary disease (HCC) 12/24/2022   Mixed hyperlipidemia 12/24/2022   Nicotine dependence with nicotine-induced disorder 12/24/2022   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   Unstable angina (HCC) 08/13/2018   Syncope 07/27/2018     Past Medical History:  Diagnosis Date   Alcohol dependence (HCC)    CHF (congestive heart failure) (HCC)    Hypertension    Tobacco abuse      Past Surgical History:  Procedure Laterality Date   APPENDECTOMY     LEFT HEART CATH AND CORONARY ANGIOGRAPHY N/A 08/25/2018   Procedure: LEFT HEART CATH AND CORONARY ANGIOGRAPHY;  Surgeon: Laurier Nancy, MD;  Location: [facility] INVASIVE CV [facility];  Service: Cardiovascular;  Laterality: N/A;   PERICARDIOCENTESIS N/A 03/06/2023   Procedure: PERICARDIOCENTESIS;  Surgeon: Marcina Millard, MD;  Location: [facility] INVASIVE CV [facility];   Service: Cardiovascular;  Laterality: N/A;    Social History   Socioeconomic History   Marital status: Married    Spouse name: Not on file   Number of children: Not on file   Years of education: Not on file   Highest education level: Not on file  Occupational History   Not on file  Tobacco Use   Smoking status: Every Day    Packs/day: .5    Types: Cigarettes    Passive exposure: Current   Smokeless tobacco: Never  Vaping Use   Vaping Use: Never used  Substance and Sexual Activity   Alcohol use: Yes    Alcohol/week: 35.0 standard drinks of alcohol    Types: 35 Cans of beer per week   Drug use: Never   Sexual activity: Not Currently    Birth control/protection: None  Other Topics Concern   Not on file  Social History Narrative   Not on file   Social Determinants of Health   Financial Resource Strain: Low Risk  (08/25/2018)   Overall Financial Resource Strain (CARDIA)    Difficulty of Paying Living Expenses: Not very hard  Food Insecurity: Not on file  Transportation Needs: Unknown (08/25/2018)   PRAPARE - Transportation    Lack of Transportation (Medical): No    Lack of Transportation (Non-Medical): Not on file  Physical Activity: Not on file  Stress: No Stress Concern Present (08/25/2018)   [employer] of Occupational Health - Occupational Stress Questionnaire    Feeling of Stress : Only a little  Social Connections: Not on file  Intimate Partner Violence: Not on file     History reviewed. No pertinent family history.   Current Facility-Administered Medications:    0.9 %  sodium chloride infusion, 250 mL, Intravenous, PRN, Tang, Cheryln Manly, PA-C   [EXPIRED] 0.9% sodium chloride infusion, 3 mL/kg/hr, Intravenous, Continuous, Stopped at 03/07/23 0400 **FOLLOWED BY** 0.9% sodium chloride infusion, 1 mL/kg/hr, Intravenous, Continuous, Tang, Cheryln Manly, PA-C, Last Rate: 45 mL/hr at 03/07/23 1150, 1 mL/kg/hr at 03/07/23 1150   acetaminophen (TYLENOL)  tablet 650 mg, 650 mg, Oral, Q6H PRN **OR** acetaminophen (TYLENOL) suppository 650 mg, 650 mg, Rectal, Q6H PRN, Verdene Lennert, MD   cefTRIAXone (ROCEPHIN) 2 g in sodium chloride 0.9 % 100 mL IVPB, 2 g, Intravenous, Q24H, Verdene Lennert, MD, Stopped at 03/06/23 1531   Chlorhexidine Gluconate Cloth 2 % PADS 6 each, 6 each, Topical, Daily, Gillis Santa, MD, 6 each at 03/07/23 1125   colchicine tablet 0.6 mg, 0.6 mg, Oral, Daily, Gillis Santa, MD, 0.6 mg at 03/06/23 1834   dexamethasone (DECADRON) tablet 4 mg, 4 mg, Oral, Daily, Verdene Lennert, MD, 4 mg at 03/06/23 1218   doxycycline (VIBRAMYCIN) 100 mg in sodium chloride 0.9 % 250 mL IVPB, 100 mg, Intravenous, BID, Raechel Chute, MD, Last Rate: 125 mL/hr at 03/07/23 1402, 100 mg at 03/07/23 1402   folic acid (FOLVITE) tablet 1 mg, 1 mg, Oral, Daily, Verdene Lennert, MD, 1 mg at 03/06/23 1218   ipratropium-albuterol (DUONEB) 0.5-2.5 (3) MG/3ML nebulizer solution 3 mL, 3 mL, Nebulization, Q6H PRN, Gillis Santa, MD   levETIRAcetam (KEPPRA) IVPB 500 mg/100 mL premix, 500 mg, Intravenous, Q12H, Verdene Lennert, MD, Stopped at 03/07/23 0603   multivitamin with minerals tablet 1 tablet, 1 tablet, Oral, Daily, Verdene Lennert, MD, 1 tablet at 03/06/23 1218   ondansetron (ZOFRAN) tablet 4 mg, 4 mg, Oral, Q6H PRN **OR** ondansetron (ZOFRAN) injection 4 mg, 4 mg, Intravenous, Q6H PRN, Verdene Lennert, MD   sodium bicarbonate tablet 650 mg, 650 mg, Oral, TID, Gillis Santa, MD   sodium chloride (hypertonic) 3 % solution, , Intravenous, Continuous, Jene Every, MD, Stopped at 03/06/23 1904   sodium chloride flush (NS) 0.9 % injection 3 mL, 3 mL, Intravenous, Q12H, Verdene Lennert, MD, 3 mL at 03/07/23 1127   sodium chloride flush (NS) 0.9 % injection 3 mL, 3 mL, Intravenous, Q12H, Tang, Cheryln Manly, PA-C, 3 mL at 03/07/23 1127   sodium chloride flush (NS) 0.9 % injection 3 mL, 3 mL, Intravenous, PRN, Tang, Cheryln Manly, PA-C   tamsulosin [facility])  capsule 0.4 mg, 0.4 mg, Oral, Daily, Verdene Lennert, MD, 0.4 mg at 03/06/23 1219   thiamine (VITAMIN B1) tablet 100 mg, 100 mg, Oral, Daily, 100 mg at 03/05/23 1648 **OR** thiamine (VITAMIN B1) injection 100 mg, 100 mg, Intravenous, Daily, Verdene Lennert, MD, 100 mg at 03/06/23 0858   Physical exam:  Vitals:   03/07/23 1000 03/07/23 1100 03/07/23 1200 03/07/23 1430  BP: (!) 97/58 (!) 105/55 (!) 103/51 (!) 101/59  Pulse: 71 66 78 71  Resp: 15 18 14 17   Temp:      TempSrc:      SpO2: 99% 99% 100% 100%  Weight:      Height:       Physical Exam HENT:     Head: Normocephalic.  Eyes:     General: No scleral icterus. Cardiovascular:     Rate and Rhythm: Normal rate.     Pulses: Normal pulses.  Pulmonary:     Effort: Pulmonary effort is normal. No  respiratory distress.  Abdominal:     Palpations: Abdomen is soft.  Musculoskeletal:     Cervical back: Normal range of motion.  Skin:    Findings: No rash.  Neurological:     Comments: Lethargic       Labs    Latest Ref Rng & Units 03/07/2023    3:08 AM 03/06/2023    2:58 AM 03/05/2023   11:12 AM  CBC  WBC 4.0 - 10.5 K/uL 14.2  11.3  13.2   Hemoglobin 13.0 - 17.0 g/dL 73.2  20.2  54.2   Hematocrit 39.0 - 52.0 % 31.0  32.2  32.8   Platelets 150 - 400 K/uL 274  309  357       Latest Ref Rng & Units 03/07/2023    4:01 PM 03/07/2023   12:21 PM 03/07/2023    7:19 AM  CMP  Sodium 135 - 145 mmol/L 131  131  129      RADIOGRAPHIC STUDIES: I have personally reviewed the radiological images as listed and agreed with the findings in the report. DG Chest Port 1 View  Result Date: 03/07/2023 CLINICAL DATA:  Status post right thoracentesis EXAM: PORTABLE CHEST 1 VIEW COMPARISON:  Chest radiograph dated 03/07/2023 FINDINGS: Lines/tubes: Partially imaged inferior approach catheter projects over the left upper quadrant. Chest: Improved lung aeration with persistent left-greater-than-right interstitial and patchy opacities. Pleura: Decreased  right pleural effusion. Persistent moderate left pleural effusion. No pneumothorax. Heart/mediastinum: Similar enlarged cardiomediastinal silhouette. Bones: No acute osseous abnormality. IMPRESSION: 1. Decreased right pleural effusion status post thoracentesis. No pneumothorax. 2. Persistent moderate left pleural effusion. 3. Improved lung aeration with persistent left-greater-than-right interstitial and patchy opacities. Electronically Signed   By: Agustin Cree M.D.   On: 03/07/2023 16:35   ECHOCARDIOGRAM LIMITED  Result Date: 03/07/2023    ECHOCARDIOGRAM LIMITED REPORT   Patient Name:   Cody [name]    Date of Exam: 03/07/2023 Medical Rec #:  [id number]  Height:       60.0 in Accession #:    [id number] Weight:       107.8 lb Date of Birth:  Dec 12, 1945 BSA:          1.435 m Patient Age:    76 years   BP:           92/60 mmHg Patient Gender: M          HR:           87 bpm. Exam Location:  [facility] Procedure: Limited Echo, Color Doppler and Cardiac Doppler Indications:     Tamponade I31.4  History:         Patient has prior history of Echocardiogram examinations, most                  recent 03/06/2023. CHF; Risk Factors:Hypertension. Tobacco abuse.  Sonographer:     Cristela Blue Referring Phys:  [id number] CARALYN HUDSON Diagnosing Phys: Marcina Millard MD IMPRESSIONS  1. Left ventricular ejection fraction, by estimation, is 60 to 65%. The left ventricle has normal function. The left ventricle has no regional wall motion abnormalities. Indeterminate diastolic filling due to E-A fusion.  2. Right ventricular systolic function is normal. The right ventricular size is normal.  3. Moderate pericardial effusion.  4. The mitral valve is normal in structure. Mild mitral valve regurgitation. No evidence of mitral stenosis.  5. The aortic valve is normal in structure. Aortic valve regurgitation is mild. No aortic stenosis is present.  6. The inferior  vena cava is normal in size with greater than 50% respiratory variability, suggesting  right atrial pressure of 3 mmHg. FINDINGS  Left Ventricle: Left ventricular ejection fraction, by estimation, is 60 to 65%. The left ventricle has normal function. The left ventricle has no regional wall motion abnormalities. The left ventricular internal cavity size was normal in size. There is  no left ventricular hypertrophy. Indeterminate diastolic filling due to E-A fusion. Right Ventricle: The right ventricular size is normal. No increase in right ventricular wall thickness. Right ventricular systolic function is normal. Left Atrium: Left atrial size was normal in size. Right Atrium: Right atrial size was normal in size. Pericardium: Pericardial effusion much reduced after pericardiocenetesis. No right atrial or right ventricular diastolic collapse. No evidence for preicardial tamonade. A moderately sized pericardial effusion is present. Mitral Valve: The mitral valve is normal in structure. Mild mitral valve regurgitation. No evidence of mitral valve stenosis. Tricuspid Valve: The tricuspid valve is normal in structure. Tricuspid valve regurgitation is mild . No evidence of tricuspid stenosis. Aortic Valve: The aortic valve is normal in structure. Aortic valve regurgitation is mild. Aortic regurgitation PHT measures 436 msec. No aortic stenosis is present. Pulmonic Valve: The pulmonic valve was normal in structure. Pulmonic valve regurgitation is not visualized. No evidence of pulmonic stenosis. Aorta: The aortic root is normal in size and structure. Venous: The inferior vena cava is normal in size with greater than 50% respiratory variability, suggesting right atrial pressure of 3 mmHg. IAS/Shunts: No atrial level shunt detected by color flow Doppler. Additional Comments: Spectral Doppler performed. Color Doppler performed.  LEFT VENTRICLE PLAX 2D LVIDd:         4.40 cm LVIDs:         2.80 cm LV PW:         0.90 cm LV IVS:        0.80 cm LVOT diam:     2.00 cm LVOT Area:     3.14 cm  LEFT ATRIUM          Index LA diam:    2.80 cm 1.95 cm/m  AORTIC VALVE AI PHT:      436 msec  AORTA Ao Root diam: 3.00 cm MITRAL VALVE                TRICUSPID VALVE MV Area (PHT): 4.99 cm     TR Peak grad:   24.0 mmHg MV Decel Time: 152 msec     TR Vmax:        245.00 cm/s MV E velocity: 126.00 cm/s                             SHUNTS                             Systemic Diam: 2.00 cm Marcina Millard MD Electronically signed by Marcina Millard MD Signature Date/Time: 03/07/2023/1:09:22 PM    Final    CT HEAD WO CONTRAST ( )  Result Date: 03/07/2023 CLINICAL DATA:  Neuro deficit, acute, stroke suspected. Altered mental status. Memory loss. EXAM: CT HEAD WITHOUT CONTRAST TECHNIQUE: Contiguous axial images were obtained from the base of the skull through the vertex without intravenous contrast. RADIATION DOSE REDUCTION: This exam was performed according to the departmental dose-optimization program which includes automated exposure control, adjustment of the mA and/or kV according to patient size and/or use of iterative reconstruction technique.  COMPARISON:  Head CT and MRI 03/05/2023 FINDINGS: Brain: There is no evidence of an acute cortically based infarct, intracranial hemorrhage, midline shift, or extra-axial fluid collection. The ventricles are normal in size. Mild-to-moderate vasogenic edema in the right frontal lobe, left occipital lobe, and right cerebellar hemisphere is unchanged and without significant associated mass effect. Mild to moderate hypodensities elsewhere in the cerebral white matter bilaterally are nonspecific but compatible with chronic small vessel ischemic disease. 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 Vascular: Calcified atherosclerosis at the skull base. No hyperdense vessel. Skull: No acute fracture or suspicious osseous lesion. Sinuses/Orbits: Minimal mucosal thickening in the paranasal sinuses. Clear mastoid air cells. Unremarkable orbits. Other: None. IMPRESSION: 1. Unchanged  edema associated with known lesions in the right frontal lobe, left occipital lobe, and right cerebellum. No significant mass effect. 2. No evidence of a new intracranial abnormality. Electronically Signed   By: Sebastian Ache M.D.   On: 03/07/2023 11:23   DG Chest Port 1 View  Result Date: 03/07/2023 CLINICAL DATA:  Pleural effusion. EXAM: PORTABLE CHEST 1 VIEW COMPARISON:  Chest radiograph and CT 03/05/2023 FINDINGS: The cardiac silhouette remains enlarged. Aortic atherosclerosis is noted. Veiling opacities in the lower lungs are consistent with persistent small to moderate pleural effusions as shown on CT. Bilateral interstitial and patchy airspace opacities have slightly worsened. No pneumothorax is identified. IMPRESSION: 1. Slight worsening of bilateral lung opacities which may reflect edema or pneumonia. 2. Persistent bilateral pleural effusions. Electronically Signed   By: Sebastian Ache M.D.   On: 03/07/2023 09:50   US Venous Img Lower Bilateral (DVT)  Result Date: 03/06/2023 CLINICAL DATA:  Lower extremity edema EXAM: BILATERAL LOWER EXTREMITY VENOUS DOPPLER ULTRASOUND TECHNIQUE: Gray-scale sonography with graded compression, as well as color Doppler and duplex ultrasound were performed to evaluate the lower extremity deep venous systems from the level of the common femoral vein and including the common femoral, femoral, profunda femoral, popliteal and calf veins including the posterior tibial, peroneal and gastrocnemius veins when visible. The superficial great saphenous vein was also interrogated. Spectral Doppler was utilized to evaluate flow at rest and with distal augmentation maneuvers in the common femoral, femoral and popliteal veins. COMPARISON:  None Available. FINDINGS: RIGHT LOWER EXTREMITY Common Femoral Vein: No evidence of thrombus. Normal compressibility, respiratory phasicity and response to augmentation. Saphenofemoral Junction: No evidence of thrombus. Normal compressibility and flow  on color Doppler imaging. Profunda Femoral Vein: No evidence of thrombus. Normal compressibility and flow on color Doppler imaging. Femoral Vein: No evidence of thrombus. Normal compressibility, respiratory phasicity and response to augmentation. Popliteal Vein: No evidence of thrombus. Normal compressibility, respiratory phasicity and response to augmentation. Calf Veins: No evidence of thrombus. Normal compressibility and flow on color Doppler imaging. Superficial Great Saphenous Vein: No evidence of thrombus. Normal compressibility. Venous Reflux:  None. Other Findings:  None. LEFT LOWER EXTREMITY Common Femoral Vein: No evidence of thrombus. Normal compressibility, respiratory phasicity and response to augmentation. Saphenofemoral Junction: No evidence of thrombus. Normal compressibility and flow on color Doppler imaging. Profunda Femoral Vein: No evidence of thrombus. Normal compressibility and flow on color Doppler imaging. Femoral Vein: No evidence of thrombus. Normal compressibility, respiratory phasicity and response to augmentation. Popliteal Vein: No evidence of thrombus. Normal compressibility, respiratory phasicity and response to augmentation. Calf Veins: No evidence of thrombus. Normal compressibility and flow on color Doppler imaging. Superficial Great Saphenous Vein: No evidence of thrombus. Normal compressibility. Venous Reflux:  None. Other Findings:  None. IMPRESSION: No  evidence of deep venous thrombosis in either lower extremity. Electronically Signed   By: Alcide Clever M.D.   On: 03/06/2023 19:43   ECHOCARDIOGRAM COMPLETE  Result Date: 03/06/2023    ECHOCARDIOGRAM REPORT   Patient Name:  Cody [name]    Date of      03/06/2023                           Exam: Medical Rec #: [id number]  Height:      60.0 in Accession #:   [id number] Weight:      99.2 lb Date of Birth: 1945/12/08 BSA:         1.386 m Patient Age:   76 years   BP:          Not listed in chart/Not listed in chart                                         mmHg Patient        M          HR:          Not listed in chart bpm. Gender: Exam Location: [facility] Procedure: 2D Echo, Cardiac Doppler and Color Doppler Indications:     Tamponade I31.4  History:         Patient has prior history of Echocardiogram examinations, most                  recent 03/05/2023. CHF; Risk Factors:Hypertension. Tobacco abuse.  Sonographer:     Cristela Blue Referring Phys:  [id number] [facility] MICHELLE TANG Diagnosing Phys: Marcina Millard MD IMPRESSIONS  1. Left ventricular ejection fraction, by estimation, is 60 to 65%. The left ventricle has normal function. The left ventricle has no regional wall motion abnormalities. Left ventricular diastolic parameters are indeterminate.  2. Right ventricular systolic function is normal. The right ventricular size is normal.  3. Successful pericardiocentesis with removal of 1100 cc serosanguinous pericardial fluid.. Findings are consistent with cardiac tamponade.  4. The mitral valve is normal in structure. No evidence of mitral valve regurgitation. No evidence of mitral stenosis.  5. The aortic valve is normal in structure. Aortic valve regurgitation is not visualized. No aortic stenosis is present.  6. The inferior vena cava is normal in size with greater than 50% respiratory variability, suggesting right atrial pressure of 3 mmHg. FINDINGS  Left Ventricle: Left ventricular ejection fraction, by estimation, is 60 to 65%. The left ventricle has normal function. The left ventricle has no regional wall motion abnormalities. The left ventricular internal cavity size was normal in size. There is  no left ventricular hypertrophy. Left ventricular diastolic parameters are indeterminate. Right Ventricle: The right ventricular size is normal. No increase in right ventricular wall thickness. Right ventricular systolic function is normal. Left Atrium: Left atrial size was normal in size. Right Atrium: Right atrial size was normal in size. Pericardium:  Successful pericardiocentesis with removal of 1100 cc serosanguinous pericardial fluid. There is no evidence of pericardial effusion. The pericardial effusion is circumferential. There is diastolic collapse of the right ventricular free wall. There is evidence of cardiac tamponade. Mitral Valve: The mitral valve is normal in structure. No evidence of mitral valve regurgitation. No evidence of mitral valve stenosis. Tricuspid Valve: The tricuspid valve is normal in structure. Tricuspid valve regurgitation is not demonstrated. No evidence  of tricuspid stenosis. Aortic Valve: The aortic valve is normal in structure. Aortic valve regurgitation is not visualized. No aortic stenosis is present. Pulmonic Valve: The pulmonic valve was normal in structure. Pulmonic valve regurgitation is not visualized. No evidence of pulmonic stenosis. Aorta: The aortic root is normal in size and structure. Venous: The inferior vena cava is normal in size with greater than 50% respiratory variability, suggesting right atrial pressure of 3 mmHg. IAS/Shunts: No atrial level shunt detected by color flow Doppler.  LEFT VENTRICLE PLAX 2D LVIDd:         3.85 cm LVIDs:         3.10 cm LV PW:         0.75 cm LV IVS:        1.10 cm  RIGHT VENTRICLE RV Basal diam:  3.40 cm RV Mid diam:    2.50 cm LEFT ATRIUM         Index       RIGHT ATRIUM           Index LA diam:    4.40 cm 3.18 cm/m  RA Area:     12.50 cm                                 RA Volume:   27.90 ml  20.14 ml/m Marcina Millard MD Electronically signed by Marcina Millard MD Signature Date/Time: 03/06/2023/5:12:27 PM    Final    CARDIAC CATHETERIZATION  Result Date: 03/06/2023 Successful pericardiocentesis with placement of pigtail catheter   ECHOCARDIOGRAM COMPLETE  Result Date: 03/06/2023    ECHOCARDIOGRAM REPORT   Patient Name:   Cody [name]    Date of Exam: 03/05/2023 Medical Rec #:  [id number]  Height:       57.0 in Accession #:    [id number] Weight:       100.0 lb Date of  Birth:  1946-05-31 BSA:          1.340 m Patient Age:    76 years   BP:           123/76 mmHg Patient Gender: M          HR:           74 bpm. Exam Location:  [facility] Procedure: 2D Echo, Cardiac Doppler and Color Doppler Indications:     I31.3 Pericardial Effusion  History:         Patient has prior history of Echocardiogram examinations, most                  recent 07/27/2018. CHF; Risk Factors:Hypertension, Current                  Smoker and Alcohol Abuse.  Sonographer:     Daphine Deutscher RDCS Referring Phys:  [id number] Verdene Lennert Diagnosing Phys: Yvonne Kendall MD IMPRESSIONS  1. Left ventricular ejection fraction, by estimation, is 55 to 60%. The left ventricle has normal function. The left ventricle has no regional wall motion abnormalities. Left ventricular diastolic parameters are consistent with Grade I diastolic dysfunction (impaired relaxation).  2. Right ventricular systolic function is mildly reduced. The right ventricular size is normal. There is normal pulmonary artery systolic pressure.  3. Large pericardial effusion. The pericardial effusion is circumferential. Findings are consistent with cardiac tamponade.  4. The mitral valve is normal in structure. Mild mitral valve regurgitation.  5. Tricuspid valve regurgitation is mild to moderate.  6. The aortic valve is tricuspid. There is mild calcification of the aortic valve. There is mild thickening of the aortic valve. Aortic valve regurgitation is mild to moderate.  7. The inferior vena cava is dilated in size with <50% respiratory variability, suggesting right atrial pressure of 15 mmHg. Conclusion(s)/Recommendation(s): Findings were conveyed to Dr. Gillis Santa at 8:02 AM on 03/06/2023 by Dr. Cristal [place]. FINDINGS  Left Ventricle: Left ventricular ejection fraction, by estimation, is 55 to 60%. The left ventricle has normal function. The left ventricle has no regional wall motion abnormalities. The left ventricular internal cavity size  was normal in size. There is  no left ventricular hypertrophy. Left ventricular diastolic parameters are consistent with Grade I diastolic dysfunction (impaired relaxation). Right Ventricle: The right ventricular size is normal. No increase in right ventricular wall thickness. Right ventricular systolic function is mildly reduced. There is normal pulmonary artery systolic pressure. The tricuspid regurgitant velocity is 1.71 m/s, and with an assumed right atrial pressure of 15 mmHg, the estimated right ventricular systolic pressure is 26.7 mmHg. Left Atrium: Left atrial size was normal in size. Right Atrium: Right atrial size was normal in size. Pericardium: A large pericardial effusion is present. The pericardial effusion is circumferential. The pericardial effusion appears to contain fibrous material. There is diastolic collapse of the right ventricular free wall and excessive respiratory variation in the mitral valve spectral Doppler velocities. There is evidence of cardiac tamponade. Mitral Valve: The mitral valve is normal in structure. There is mild thickening of the mitral valve leaflet(s). Mild mitral valve regurgitation. Tricuspid Valve: The tricuspid valve is normal in structure. Tricuspid valve regurgitation is mild to moderate. Aortic Valve: The aortic valve is tricuspid. There is mild calcification of the aortic valve. There is mild thickening of the aortic valve. Aortic valve regurgitation is mild to moderate. Aortic regurgitation PHT measures 507 msec. Aortic valve mean gradient measures 2.5 mmHg. Aortic valve peak gradient measures 4.3 mmHg. Aortic valve area, by VTI measures 2.84 cm. Pulmonic Valve: The pulmonic valve was normal in structure. Pulmonic valve regurgitation is trivial. No evidence of pulmonic stenosis. Aorta: The aortic root and ascending aorta are structurally normal, with no evidence of dilitation. Pulmonary Artery: The pulmonary artery is of normal size. Venous: The inferior vena  cava is dilated in size with less than 50% respiratory variability, suggesting right atrial pressure of 15 mmHg. IAS/Shunts: The interatrial septum was not well visualized.  LEFT VENTRICLE PLAX 2D LVIDd:         3.50 cm   Diastology LVIDs:         2.50 cm   LV e' medial:    4.46 cm/s LV PW:         0.90 cm   LV E/e' medial:  9.7 LV IVS:        0.80 cm   LV e' lateral:   6.31 cm/s LVOT diam:     2.00 cm   LV E/e' lateral: 6.9 LV SV:         46 LV SV Index:   34 LVOT Area:     3.14 cm  RIGHT VENTRICLE RV Basal diam:  3.10 cm RV S prime:     7.29 cm/s TAPSE (M-mode): 1.3 cm LEFT ATRIUM             Index        RIGHT ATRIUM           Index LA diam:  3.10 cm 2.31 cm/m   RA Area:     10.70 cm LA Vol (A2C):   37.7 ml 28.14 ml/m  RA Volume:   27.40 ml  20.45 ml/m LA Vol (A4C):   25.3 ml 18.88 ml/m LA Biplane Vol: 33.9 ml 25.30 ml/m  AORTIC VALVE AV Area (Vmax):    2.86 cm AV Area (Vmean):   2.60 cm AV Area (VTI):     2.84 cm AV Vmax:           103.69 cm/s AV Vmean:          74.930 cm/s AV VTI:            0.161 m AV Peak Grad:      4.3 mmHg AV Mean Grad:      2.5 mmHg LVOT Vmax:         94.33 cm/s LVOT Vmean:        62.000 cm/s LVOT VTI:          0.145 m LVOT/AV VTI ratio: 0.90 AI PHT:            507 msec  AORTA Ao Root diam: 3.20 cm Ao Asc diam:  3.50 cm MITRAL VALVE               TRICUSPID VALVE MV Area (PHT): 4.28 cm    TR Peak grad:   11.7 mmHg MV Decel Time: 177 msec    TR Vmax:        171.00 cm/s MV E velocity: 43.23 cm/s MV A velocity: 63.73 cm/s  SHUNTS MV E/A ratio:  0.68        Systemic VTI:  0.15 m                            Systemic Diam: 2.00 cm Yvonne Kendall MD Electronically signed by Yvonne Kendall MD Signature Date/Time: 03/06/2023/8:13:55 AM    Final    MR Brain W and Wo Contrast  Result Date: 03/05/2023 CLINICAL DATA:  Mental status change, unknown cause EXAM: MRI HEAD WITHOUT AND WITH CONTRAST TECHNIQUE: Multiplanar, multiecho pulse sequences of the brain and surrounding structures  were obtained without and with intravenous contrast. CONTRAST:  5mL GADAVIST GADOBUTROL 1 MMOL/ML IV SOLN COMPARISON:  Same day CT head FINDINGS: Brain: Negative for an acute infarct. No hydrocephalus. No extra-axial fluid collection. Small microhemorrhage in the right lentiform nucleus. Chronic left cerebellar infarct. There is sequela mild-to-moderate chronic microvascular ischemic change. There are multiple lesions, including- -1.0 x 0.7 cm contrast-enhancing lesion in the right frontal lobe with surrounding vasogenic edema (series 18, image 108) - 0.8 x 0.7 cm contrast-enhancing lesions in in the left occipital lobe with surrounding vasogenic edema (series 18, image 69). -1.2 x 1.1 cm contrast-enhancing lesion in the superior right cerebellum mild surrounding vasogenic edema (series 18, image 50) -0.6 x 0.4 cm dural-based contrast-enhancing lesion along the left frontal convexity (series 93, image 176). Vascular: Normal flow voids. Skull and upper cervical spine: Normal marrow signal. Sinuses/Orbits: No middle ear or mastoid effusion. Mild mucosal thickening bilateral ethmoid air cells. Orbits are unremarkable. Other: None. IMPRESSION: 1. Multiple contrast-enhancing lesions in the right frontal lobe, left occipital lobe, and superior right cerebellum with surrounding vasogenic edema. 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 2. Small dural-based contrast-enhancing lesion along the left frontal convexity could represent a small meningimoa, but metastatic disease is not excluded. Electronically Signed   By: Elige Radon.D.  On: 03/05/2023 16:48   CT CHEST ABDOMEN PELVIS W CONTRAST  Result Date: 03/05/2023 CLINICAL DATA:  Brain mass. Assess for occult malignancy. * Tracking Code: BO * EXAM: CT CHEST, ABDOMEN, AND PELVIS WITH CONTRAST TECHNIQUE: Multidetector CT imaging of the chest, abdomen and pelvis was performed following the standard protocol during bolus administration of intravenous  contrast. RADIATION DOSE REDUCTION: This exam was performed according to the departmental dose-optimization program which includes automated exposure control, adjustment of the mA and/or kV according to patient size and/or use of iterative reconstruction technique. CONTRAST:  75mL OMNIPAQUE IOHEXOL 300 MG/ML  SOLN COMPARISON:  Chest CT without contrast 04/28/2015. X-ray chest earlier 03/05/2023 FINDINGS: CT CHEST FINDINGS Cardiovascular: Very large pericardial effusion. The heart itself is nonenlarged. Significant coronary artery calcifications. The thoracic aorta has a normal course and caliber with mild atherosclerotic calcified plaque. Mediastinum/Nodes: Normal caliber thoracic esophagus. Preserved thyroid gland. No specific abnormal lymph node enlargement seen including in the axillary regions, hilum. Prominent right paratracheal node on series 2, image 18 measures 18 by 7 mm. This could be 2 adjacent nodes. Subcarinal node series 2, image 29 measures 2.7 by 1.1 cm, mildly enlarged. Lungs/Pleura: Moderate right and small left effusion. There are some loculated components on the left with fluid tracking along the interlobar fissure. Adjacent parenchymal opacities. Atelectasis versus infiltrate. There is a opacity specifically in the lingula as well. Scattered ground-glass elsewhere in the visualized lungs. No pneumothorax. No obvious mass. Significant breathing motion throughout the examination. A subtle mass lesion could be obscured by the lung opacity in the effusions. Follow up study could be performed after improvement of the effusions. Musculoskeletal: Mild degenerative changes along the spine. CT ABDOMEN PELVIS FINDINGS Hepatobiliary: Contrast exam in the abdomen is more in the late arterial phase and portal venous phase. Patent portal vein. Gallbladder is present. Small cysts seen in segment 4 of the liver. Pancreas: Unremarkable. No pancreatic ductal dilatation or surrounding inflammatory changes. Spleen:  Normal in size without focal abnormality. Adrenals/Urinary Tract: Slight nonspecific thickening of the adrenal glands but unchanged from previous. No enhancing renal mass or collecting system dilatation. There are some small low-attenuation lesions in the left kidney which are too small to completely characterize but likely benign cysts, Bosniak 2 lesions. No specific imaging follow-up. The ureters have normal course and caliber extending down to the bladder. Diffuse wall thickening of the urinary bladder. Stomach/Bowel: On this non oral contrast exam, large bowel has a normal caliber. Redundant course of the sigmoid colon. There are some areas of mild wall thickening along loops of bowel, nonspecific. The stomach is mildly distended with fluid. Mild fold thickening suggested small bowel is nondilated. Vascular/Lymphatic: Normal caliber aorta and IVC with moderate calcified plaque along the aorta and iliac vessels. There are several enlarged nodes identified in the retroperitoneum and upper abdomen. Example aortocaval node posteriorly on series 2, image 64 measures 17 by 13 mm. No next of the celiac series 2, image 59 measures 16 by 14 mm. Left para-node anteriorly on series 2, image 68 measures 17 by 8 mm. A few small retrocrural nodes are also identified. Reproductive: Enlarged heterogeneous prostate. Please correlate with patient's PSA. Other: Anasarca.  Mesenteric haziness.  Trace ascites.  No free air Musculoskeletal: Mild degenerative changes along the spine. IMPRESSION: Large pericardial effusion. Please correlate with and new symptoms including for tamponade. Moderate right and small left effusion with some loculated components on the left. Adjacent parenchymal opacities. Atelectasis versus infiltrate. Recommend follow up after clearance of  the effusions. Few borderline enlarged lymph nodes identified including mediastinum, upper retroperitoneum. Enlarged prostate with mass effect along the bladder with  bladder wall thickening. Please correlate with patient's PSA. No bowel obstruction or free air. There is scattered mesenteric haziness with trace ascites. There are some small areas of wall thickening along the colon but this could simply relate to the other signs of edema. Electronically Signed   By: Karen Kays M.D.   On: 03/05/2023 15:03   DG Chest Port 1 View  Result Date: 03/05/2023 CLINICAL DATA:  Shortness of breath EXAM: PORTABLE CHEST 1 VIEW COMPARISON:  07/27/2018 FINDINGS: Cardiac silhouette is enlarged with diffuse interstitial opacities throughout both lungs compatible with edema related to CHF. Basilar atelectasis noted, worse on the left. Difficult to exclude small effusions. Also left lower lobe retrocardiac opacity obscures the left hemidiaphragm concerning for possible superimposed pneumonia. No pneumothorax. Trachea midline. Aorta atherosclerotic. No acute osseous finding. IMPRESSION: 1. Cardiomegaly with diffuse interstitial edema pattern. 2. Left lower lobe airspace consolidation concerning for superimposed pneumonia. 3. Suspect small effusions. Electronically Signed   By: Judie Petit.  Shick M.D.   On: 03/05/2023 13:20   CT Head Wo Contrast  Result Date: 03/05/2023 CLINICAL DATA:  Mental status change of unknown cause. Memory disturbance. EXAM: CT HEAD WITHOUT CONTRAST TECHNIQUE: Contiguous axial images were obtained from the base of the skull through the vertex without intravenous contrast. RADIATION DOSE REDUCTION: This exam was performed according to the departmental dose-optimization program which includes automated exposure control, adjustment of the mA and/or kV according to patient size and/or use of iterative reconstruction technique. COMPARISON:  07/27/2018 FINDINGS: Brain: No focal abnormality affects the brainstem. Question mild edema and mass effect in the inferior right cerebellum. 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 There is abnormal vasogenic edema within the right  posterior frontal region worrisome for an underlying mass lesion. In the left hemisphere, there is abnormal vasogenic edema in the occipital lobe worrisome for the presence of an occult mass lesion. No visible hemorrhage. Hydrocephalus or extra-axial collection. Vascular: There is atherosclerotic calcification of the major vessels at the base of the brain. Skull: No calvarial abnormality. Sinuses/Orbits: Clear/normal Other: None IMPRESSION: 1. Abnormal vasogenic edema in the right posterior frontal region and in the left occipital lobe worrisome for the presence of occult mass lesions. Question mild edema and mass effect in the inferior right cerebellum. No visible hemorrhage. MRI with and without contrast is recommended for further evaluation. 2. 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 3. Atherosclerotic calcification of the major vessels at the base of the brain. Electronically Signed   By: Paulina Fusi M.D.   On: 03/05/2023 12:52    Assessment and plan-   # Multiple brain mass with vasogenic edema. Neurosurgery recommendation reviewed. Continue Decadron and seizure prophylaxis with Keppra   # Pleural effusion, Mediastinal and retroperitoneal lymphadenopathy S/p therapeutic and diagnostic thoracentesis, pending fluid analysis including cytology, cell count, culture, protein, glucose, and LDH Consider repeat CT imaging after thoracentesis for evaluation of lung parenchyma, rule out potential lung lesion which were obscured due to atelectasis from pleural effusion.  LDH is elevated in the 600s.  Indicating aggressive process.   Patient has longstanding smoking and drinking history.  Differential diagnosis include primary lung cancer, Differential diagnosis include primary lung malignancy, lymphoma, other solid organ malignancy. COPD exacerbation, with empiric antibiotics coverage.   # Pericardial effusion, status post pericardiocentesis # Hyponatremia, sodium has improved.  Off Hypertonic  saline # Transaminitis, could  be secondary to chronic alcohol use. CIWA protocol    Patient is lethargic.  I had discussion with multiple family members at bedside including one of his daughter and 2 of his son-in-law's. Patient is critically ill.  Prognosis is poor, awaiting above workup.  Currently family members prefer to keep CODE STATUS as full code. Family needs more information to make further decisions.  Thank you for allowing me to participate in the care of this patient.   Rickard Patience, MD, PhD Hematology Oncology 03/07/2023

## 2023-03-07 NOTE — Progress Notes (Signed)
SUBJECTIVE: Cody [name] is a 77yo [place] male with a PMH of HFpEF, alcohol use disorder, ongoing tobacco abuse, HTN who presented to [facility] ED 03/05/2023 with altered mental status (speaking nonsensically and having difficulty with his memory), generalized weakness, shortness of breath, and left-sided chest discomfort.  He was admitted to stepdown and head CT obtained showed abnormal vasogenic edema in the right posterior frontal region and left occipital lobe worrisome for mass lesions.  CT chest abdomen pelvis revealed a large pericardial effusion, confirmed by echo this morning to be a large circumferential pericardial effusion with findings consistent with cardiac tamponade.    Patient underwent urgent pericardiocentesis and pleural fluid analysis on 03/06/23. Patient developed atrial fibrillation with slow ventricular response post procedure.    Vitals:   03/07/23 0400 03/07/23 0500 03/07/23 0600 03/07/23 0700  BP: (!) 116/45 96/73 (!) 98/52 92/60  Pulse: 63 62 82 87  Resp: 15 15 16 14   Temp:      TempSrc:      SpO2: 92% 94% (!) 79% 93%  Weight:  48.9 kg    Height:        Intake/Output Summary (Last 24 hours) at 03/07/2023 0853 Last data filed at 03/07/2023 1610 Gross per 24 hour  Intake 1260.46 ml  Output 300 ml  Net 960.46 ml    LABS: Basic Metabolic Panel: Recent Labs    03/06/23 0258 03/06/23 0849 03/06/23 1455 03/07/23 0308 03/07/23 0719  NA 124* 125*   < > 130* 129*  K 5.0  --   --  4.5  --   CL 95*  --   --  102  --   CO2 17*  --   --  18*  --   GLUCOSE 129*  --   --  100*  --   BUN 42*  --   --  43*  --   CREATININE 0.99  --   --  0.89  --   CALCIUM 8.5*  --   --  8.0*  --   MG  --  2.5*  --  2.5*  --   PHOS  --  4.3  --  4.5  --    < > = values in this interval not displayed.   Liver Function Tests: Recent Labs    03/06/23 0258 03/07/23 0308  AST 254* 119*  ALT 217* 142*  ALKPHOS 82 59  BILITOT 0.9 0.7  PROT 6.7 5.5*  ALBUMIN 2.7* 2.3*   No results for  input(s): "LIPASE", "AMYLASE" in the last 72 hours. CBC: Recent Labs    03/06/23 0258 03/07/23 0308  WBC 11.3* 14.2*  NEUTROABS 9.2*  --   HGB 10.9* 10.0*  HCT 32.2* 31.0*  MCV 91.5 96.9  PLT 309 274   Cardiac Enzymes: No results for input(s): "CKTOTAL", "CKMB", "CKMBINDEX", "TROPONINI" in the last 72 hours. BNP: Invalid input(s): "POCBNP" D-Dimer: No results for input(s): "DDIMER" in the last 72 hours. Hemoglobin A1C: No results for input(s): "HGBA1C" in the last 72 hours. Fasting Lipid Panel: No results for input(s): "CHOL", "HDL", "LDLCALC", "TRIG", "CHOLHDL", "LDLDIRECT" in the last 72 hours. Thyroid Function Tests: Recent Labs    03/06/23 0258  TSH 0.532   Anemia Panel: No results for input(s): "VITAMINB12", "FOLATE", "FERRITIN", "TIBC", "IRON", "RETICCTPCT" in the last 72 hours.   PHYSICAL EXAM General: no acute distress, sleeping HEENT:  Normocephalic and atramatic Neck:  No JVD.  Lungs: Clear bilaterally to auscultation and percussion. Heart: irregular rhythm, regular rate Abdomen: Bowel  sounds are positive, abdomen soft and non-tender  Msk:  Back normal, normal gait. Normal strength and tone for age. Extremities: No clubbing, cyanosis or edema.     TELEMETRY: atrial fibrillation, HR 68 bpm  ASSESSMENT AND PLAN: Patient resting comfortably in bed. Continues to be in atrial fibrillation with slow ventricular rate. Heparin being held for now. Repeat echo this morning, results pending. Will continue to follow.    ICD-10-CM   1. Acute hyponatremia  E87.1     2. Vasogenic edema (HCC)  G93.6     3. Pneumonia of left lower lobe due to infectious organism  J18.9       Principal Problem:   Acute encephalopathy Active Problems:   Congestive heart failure (HCC)   Chronic obstructive pulmonary disease (HCC)   Acute hyponatremia   Pericardial effusion   Vasogenic edema (HCC)   Brain mass   Pleural effusion, bilateral   Alcohol use disorder   BPH (benign  prostatic hyperplasia)   Elevated LFTs   Pneumonia of left lower lobe due to infectious organism   Metastasis to brain [facility])    Kaysie Michelini, FNP-C 03/07/2023 8:53 AM

## 2023-03-07 NOTE — Progress Notes (Signed)
[facility] CLINIC CARDIOLOGY CONSULT NOTE       Patient ID: Cody [name] MRN: [id number] DOB/AGE: 76-11-1945 76 y.o.  Admit date: 03/05/2023 Referring Physician Dr. Gillis Santa Primary Physician Orson Eva, NP  Primary Cardiologist Dr. Adrian Blackwater Reason for Consultation cardiac tamponade  HPI: Cody [name] is a 77yo [place] male with a PMH of HFpEF, alcohol use disorder, ongoing tobacco abuse, HTN who presented to [facility] ED 03/05/2023 with altered mental status (speaking nonsensically and having difficulty with his memory), generalized weakness, shortness of breath, and left-sided chest discomfort.  He was admitted to stepdown and head CT obtained showed abnormal vasogenic edema in the right posterior frontal region and left occipital lobe worrisome for mass lesions.  CT chest abdomen pelvis revealed a large pericardial effusion, confirmed by echo this morning to be a large circumferential pericardial effusion with findings consistent with cardiac tamponade.  [facility] cardiology following for pericardiocentesis and postprocedural management.  Interval history: -S/p pericardiocentesis with 1.1 L of bloody fluid aspirated and pigtail catheter left in place -Repeat limited echo this morning showed a moderate pericardial effusion with significant reduction after pericardiocentesis without RA or RV diastolic collapse or evidence of pericardial tamponade. -On exam the patient remains encephalopathic and is predominantly moaning to stimuli but is hemodynamically stable, on room air.  Past Medical History:  Diagnosis Date   Alcohol dependence (HCC)    CHF (congestive heart failure) (HCC)    Hypertension    Tobacco abuse     Past Surgical History:  Procedure Laterality Date   APPENDECTOMY     LEFT HEART CATH AND CORONARY ANGIOGRAPHY N/A 08/25/2018   Procedure: LEFT HEART CATH AND CORONARY ANGIOGRAPHY;  Surgeon: Laurier Nancy, MD;  Location: [facility] INVASIVE CV LAB;  Service: Cardiovascular;  Laterality:  N/A;   PERICARDIOCENTESIS N/A 03/06/2023   Procedure: PERICARDIOCENTESIS;  Surgeon: Marcina Millard, MD;  Location: [facility] INVASIVE CV LAB;  Service: Cardiovascular;  Laterality: N/A;    Medications Prior to Admission  Medication Sig Dispense Refill Last Dose   tamsulosin (FLOMAX) 0.4 MG CAPS capsule Take 1 capsule (0.4 mg total) by mouth daily. 90 capsule 3 Past Week   allopurinol (ZYLOPRIM) 100 MG tablet Take 100 mg by mouth daily. (Patient not taking: Reported on 12/24/2022)      aspirin EC 81 MG tablet Take 81 mg by mouth daily. (Patient not taking: Reported on 03/05/2023)   Not Taking   atorvastatin (LIPITOR) 80 MG tablet Take 1 tablet by mouth every evening. (Patient not taking: Reported on 03/05/2023)  5 Not Taking   carvedilol (COREG) 25 MG tablet Take 25 mg by mouth 2 (two) times daily. (Patient not taking: Reported on 03/05/2023)  3 Not Taking   colchicine 0.6 MG tablet Take by mouth.      ENTRESTO 24-26 MG Take 1 tablet by mouth 2 (two) times daily. (Patient not taking: Reported on 03/05/2023)  5 Not Taking   ezetimibe (ZETIA) 10 MG tablet Take 10 mg by mouth daily. (Patient not taking: Reported on 03/05/2023)   Not Taking   finasteride (PROSCAR) 5 MG tablet Take 1 tablet (5 mg total) by mouth daily. (Patient not taking: Reported on 03/05/2023) 90 tablet 3 Not Taking   Social History   Socioeconomic History   Marital status: Married    Spouse name: Not on file   Number of children: Not on file   Years of education: Not on file   Highest education level: Not on file  Occupational History   Not on  file  Tobacco Use   Smoking status: Every Day    Packs/day: .5    Types: Cigarettes    Passive exposure: Current   Smokeless tobacco: Never  Vaping Use   Vaping Use: Never used  Substance and Sexual Activity   Alcohol use: Yes    Alcohol/week: 35.0 standard drinks of alcohol    Types: 35 Cans of beer per week   Drug use: Never   Sexual activity: Not Currently    Birth  control/protection: None  Other Topics Concern   Not on file  Social History Narrative   Not on file   Social Determinants of Health   Financial Resource Strain: Low Risk  (08/25/2018)   Overall Financial Resource Strain (CARDIA)    Difficulty of Paying Living Expenses: Not very hard  Food Insecurity: Not on file  Transportation Needs: Unknown (08/25/2018)   PRAPARE - Transportation    Lack of Transportation (Medical): No    Lack of Transportation (Non-Medical): Not on file  Physical Activity: Not on file  Stress: No Stress Concern Present (08/25/2018)   [employer] of Occupational Health - Occupational Stress Questionnaire    Feeling of Stress : Only a little  Social Connections: Not on file  Intimate Partner Violence: Not on file    History reviewed. No pertinent family history.    Intake/Output Summary (Last 24 hours) at 03/07/2023 1410 Last data filed at 03/07/2023 1245 Gross per 24 hour  Intake 1212.29 ml  Output 1100 ml  Net 112.29 ml     Vitals:   03/07/23 0900 03/07/23 1000 03/07/23 1100 03/07/23 1200  BP: (!) 110/54 (!) 97/58 (!) 105/55 (!) 103/51  Pulse: 68 71 66 78  Resp: 19 15 18 14   Temp:      TempSrc:      SpO2: (!) 83% 99% 99% 100%  Weight:      Height:        PHYSICAL EXAM General: Ill-appearing thin Asian male, laying at low incline in ICU bed without family present.   HEENT:  Normocephalic and atraumatic. Neck:  no JVD.  Lungs: Normal respiratory effort on room air.   Heart: Irregularly irregular with controlled rate. Chest: Subxiphoid pigtail catheter sutured in place. Abdomen: Non-distended appearing.  Msk: Normal strength and tone for age. Extremities: No obvious swelling  neuro: Somnolent and encephalopathic, moaning to stimuli Psych: Somnolent  Labs: Basic Metabolic Panel: Recent Labs    03/06/23 0258 03/06/23 0849 03/06/23 1455 03/07/23 0308 03/07/23 0719 03/07/23 1221  NA 124* 125*   < > 130* 129* 131*  K 5.0  --   --   4.5  --   --   CL 95*  --   --  102  --   --   CO2 17*  --   --  18*  --   --   GLUCOSE 129*  --   --  100*  --   --   BUN 42*  --   --  43*  --   --   CREATININE 0.99  --   --  0.89  --   --   CALCIUM 8.5*  --   --  8.0*  --   --   MG  --  2.5*  --  2.5*  --   --   PHOS  --  4.3  --  4.5  --   --    < > = values in this interval not displayed.  Liver Function Tests: Recent Labs    03/06/23 0258 03/07/23 0308  AST 254* 119*  ALT 217* 142*  ALKPHOS 82 59  BILITOT 0.9 0.7  PROT 6.7 5.5*  ALBUMIN 2.7* 2.3*    No results for input(s): "LIPASE", "AMYLASE" in the last 72 hours. CBC: Recent Labs    03/06/23 0258 03/07/23 0308  WBC 11.3* 14.2*  NEUTROABS 9.2*  --   HGB 10.9* 10.0*  HCT 32.2* 31.0*  MCV 91.5 96.9  PLT 309 274    Cardiac Enzymes: No results for input(s): "CKTOTAL", "CKMB", "CKMBINDEX", "TROPONINIHS" in the last 72 hours. BNP: Recent Labs    03/05/23 1112  BNP 99.4    D-Dimer: No results for input(s): "DDIMER" in the last 72 hours. Hemoglobin A1C: No results for input(s): "HGBA1C" in the last 72 hours. Fasting Lipid Panel: No results for input(s): "CHOL", "HDL", "LDLCALC", "TRIG", "CHOLHDL", "LDLDIRECT" in the last 72 hours. Thyroid Function Tests: Recent Labs    03/06/23 0258  TSH 0.532    Anemia Panel: No results for input(s): "VITAMINB12", "FOLATE", "FERRITIN", "TIBC", "IRON", "RETICCTPCT" in the last 72 hours.   Radiology: ECHOCARDIOGRAM LIMITED  Result Date: 03/07/2023    ECHOCARDIOGRAM LIMITED REPORT   Patient Name:   Cody [name]    Date of Exam: 03/07/2023 Medical Rec #:  [id number]  Height:       60.0 in Accession #:    [id number] Weight:       107.8 lb Date of Birth:  [birth year] BSA:          1.435 m Patient Age:    76 years   BP:           92/60 mmHg Patient Gender: M          HR:           87 bpm. Exam Location:  [facility] Procedure: Limited Echo, Color Doppler and Cardiac Doppler Indications:     Tamponade I31.4  History:         Patient has  prior history of Echocardiogram examinations, most                  recent 03/06/2023. CHF; Risk Factors:Hypertension. Tobacco abuse.  Sonographer:     Cristela Blue Referring Phys:  [id number] CARALYN HUDSON Diagnosing Phys: Marcina Millard MD IMPRESSIONS  1. Left ventricular ejection fraction, by estimation, is 60 to 65%. The left ventricle has normal function. The left ventricle has no regional wall motion abnormalities. Indeterminate diastolic filling due to E-A fusion.  2. Right ventricular systolic function is normal. The right ventricular size is normal.  3. Moderate pericardial effusion.  4. The mitral valve is normal in structure. Mild mitral valve regurgitation. No evidence of mitral stenosis.  5. The aortic valve is normal in structure. Aortic valve regurgitation is mild. No aortic stenosis is present.  6. The inferior vena cava is normal in size with greater than 50% respiratory variability, suggesting right atrial pressure of 3 mmHg. FINDINGS  Left Ventricle: Left ventricular ejection fraction, by estimation, is 60 to 65%. The left ventricle has normal function. The left ventricle has no regional wall motion abnormalities. The left ventricular internal cavity size was normal in size. There is  no left ventricular hypertrophy. Indeterminate diastolic filling due to E-A fusion. Right Ventricle: The right ventricular size is normal. No increase in right ventricular wall thickness. Right ventricular systolic function is normal. Left Atrium: Left atrial size was normal in size. Right Atrium: Right atrial size was  normal in size. Pericardium: Pericardial effusion much reduced after pericardiocenetesis. No right atrial or right ventricular diastolic collapse. No evidence for preicardial tamonade. A moderately sized pericardial effusion is present. Mitral Valve: The mitral valve is normal in structure. Mild mitral valve regurgitation. No evidence of mitral valve stenosis. Tricuspid Valve: The tricuspid valve is  normal in structure. Tricuspid valve regurgitation is mild . No evidence of tricuspid stenosis. Aortic Valve: The aortic valve is normal in structure. Aortic valve regurgitation is mild. Aortic regurgitation PHT measures 436 msec. No aortic stenosis is present. Pulmonic Valve: The pulmonic valve was normal in structure. Pulmonic valve regurgitation is not visualized. No evidence of pulmonic stenosis. Aorta: The aortic root is normal in size and structure. Venous: The inferior vena cava is normal in size with greater than 50% respiratory variability, suggesting right atrial pressure of 3 mmHg. IAS/Shunts: No atrial level shunt detected by color flow Doppler. Additional Comments: Spectral Doppler performed. Color Doppler performed.  LEFT VENTRICLE PLAX 2D LVIDd:         4.40 cm LVIDs:         2.80 cm LV PW:         0.90 cm LV IVS:        0.80 cm LVOT diam:     2.00 cm LVOT Area:     3.14 cm  LEFT ATRIUM         Index LA diam:    2.80 cm 1.95 cm/m  AORTIC VALVE AI PHT:      436 msec  AORTA Ao Root diam: 3.00 cm MITRAL VALVE                TRICUSPID VALVE MV Area (PHT): 4.99 cm     TR Peak grad:   24.0 mmHg MV Decel Time: 152 msec     TR Vmax:        245.00 cm/s MV E velocity: 126.00 cm/s                             SHUNTS                             Systemic Diam: 2.00 cm Marcina Millard MD Electronically signed by Marcina Millard MD Signature Date/Time: 03/07/2023/1:09:22 PM    Final    CT HEAD WO CONTRAST ( )  Result Date: 03/07/2023 CLINICAL DATA:  Neuro deficit, acute, stroke suspected. Altered mental status. Memory loss. EXAM: CT HEAD WITHOUT CONTRAST TECHNIQUE: Contiguous axial images were obtained from the base of the skull through the vertex without intravenous contrast. RADIATION DOSE REDUCTION: This exam was performed according to the departmental dose-optimization program which includes automated exposure control, adjustment of the mA and/or kV according to patient size and/or use of iterative  reconstruction technique. COMPARISON:  Head CT and MRI 03/05/2023 FINDINGS: Brain: There is no evidence of an acute cortically based infarct, intracranial hemorrhage, midline shift, or extra-axial fluid collection. The ventricles are normal in size. Mild-to-moderate vasogenic edema in the right frontal lobe, left occipital lobe, and right cerebellar hemisphere is unchanged and without significant associated mass effect. Mild to moderate hypodensities elsewhere in the cerebral white matter bilaterally are nonspecific but compatible with chronic small vessel ischemic disease. 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 Vascular: Calcified atherosclerosis at the skull base. No hyperdense vessel. Skull: No acute fracture or suspicious osseous lesion. Sinuses/Orbits: Minimal mucosal thickening  in the paranasal sinuses. Clear mastoid air cells. Unremarkable orbits. Other: None. IMPRESSION: 1. Unchanged edema associated with known lesions in the right frontal lobe, left occipital lobe, and right cerebellum. No significant mass effect. 2. No evidence of a new intracranial abnormality. Electronically Signed   By: Sebastian Ache M.D.   On: 03/07/2023 11:23   DG Chest Port 1 View  Result Date: 03/07/2023 CLINICAL DATA:  Pleural effusion. EXAM: PORTABLE CHEST 1 VIEW COMPARISON:  Chest radiograph and CT 03/05/2023 FINDINGS: The cardiac silhouette remains enlarged. Aortic atherosclerosis is noted. Veiling opacities in the lower lungs are consistent with persistent small to moderate pleural effusions as shown on CT. Bilateral interstitial and patchy airspace opacities have slightly worsened. No pneumothorax is identified. IMPRESSION: 1. Slight worsening of bilateral lung opacities which may reflect edema or pneumonia. 2. Persistent bilateral pleural effusions. Electronically Signed   By: Sebastian Ache M.D.   On: 03/07/2023 09:50   US Venous Img Lower Bilateral (DVT)  Result Date: 03/06/2023 CLINICAL DATA:  Lower  extremity edema EXAM: BILATERAL LOWER EXTREMITY VENOUS DOPPLER ULTRASOUND TECHNIQUE: Gray-scale sonography with graded compression, as well as color Doppler and duplex ultrasound were performed to evaluate the lower extremity deep venous systems from the level of the common femoral vein and including the common femoral, femoral, profunda femoral, popliteal and calf veins including the posterior tibial, peroneal and gastrocnemius veins when visible. The superficial great saphenous vein was also interrogated. Spectral Doppler was utilized to evaluate flow at rest and with distal augmentation maneuvers in the common femoral, femoral and popliteal veins. COMPARISON:  None Available. FINDINGS: RIGHT LOWER EXTREMITY Common Femoral Vein: No evidence of thrombus. Normal compressibility, respiratory phasicity and response to augmentation. Saphenofemoral Junction: No evidence of thrombus. Normal compressibility and flow on color Doppler imaging. Profunda Femoral Vein: No evidence of thrombus. Normal compressibility and flow on color Doppler imaging. Femoral Vein: No evidence of thrombus. Normal compressibility, respiratory phasicity and response to augmentation. Popliteal Vein: No evidence of thrombus. Normal compressibility, respiratory phasicity and response to augmentation. Calf Veins: No evidence of thrombus. Normal compressibility and flow on color Doppler imaging. Superficial Great Saphenous Vein: No evidence of thrombus. Normal compressibility. Venous Reflux:  None. Other Findings:  None. LEFT LOWER EXTREMITY Common Femoral Vein: No evidence of thrombus. Normal compressibility, respiratory phasicity and response to augmentation. Saphenofemoral Junction: No evidence of thrombus. Normal compressibility and flow on color Doppler imaging. Profunda Femoral Vein: No evidence of thrombus. Normal compressibility and flow on color Doppler imaging. Femoral Vein: No evidence of thrombus. Normal compressibility, respiratory  phasicity and response to augmentation. Popliteal Vein: No evidence of thrombus. Normal compressibility, respiratory phasicity and response to augmentation. Calf Veins: No evidence of thrombus. Normal compressibility and flow on color Doppler imaging. Superficial Great Saphenous Vein: No evidence of thrombus. Normal compressibility. Venous Reflux:  None. Other Findings:  None. IMPRESSION: No evidence of deep venous thrombosis in either lower extremity. Electronically Signed   By: Alcide Clever M.D.   On: 03/06/2023 19:43   ECHOCARDIOGRAM COMPLETE  Result Date: 03/06/2023    ECHOCARDIOGRAM REPORT   Patient Name:  Cody [name]    Date of      03/06/2023                           Exam: Medical Rec #: [id number]  Height:      60.0 in Accession #:   [id number] Weight:      99.2 lb Date  of Birth: 23-May-1946 BSA:         1.386 m Patient Age:   76 years   BP:          Not listed in chart/Not listed in chart                                        mmHg Patient        M          HR:          Not listed in chart bpm. Gender: Exam Location: [facility] Procedure: 2D Echo, Cardiac Doppler and Color Doppler Indications:     Tamponade I31.4  History:         Patient has prior history of Echocardiogram examinations, most                  recent 03/05/2023. CHF; Risk Factors:Hypertension. Tobacco abuse.  Sonographer:     Cristela Blue Referring Phys:  [id number] [facility] MICHELLE Mieka Leaton Diagnosing Phys: Marcina Millard MD IMPRESSIONS  1. Left ventricular ejection fraction, by estimation, is 60 to 65%. The left ventricle has normal function. The left ventricle has no regional wall motion abnormalities. Left ventricular diastolic parameters are indeterminate.  2. Right ventricular systolic function is normal. The right ventricular size is normal.  3. Successful pericardiocentesis with removal of 1100 cc serosanguinous pericardial fluid.. Findings are consistent with cardiac tamponade.  4. The mitral valve is normal in structure. No evidence of mitral valve  regurgitation. No evidence of mitral stenosis.  5. The aortic valve is normal in structure. Aortic valve regurgitation is not visualized. No aortic stenosis is present.  6. The inferior vena cava is normal in size with greater than 50% respiratory variability, suggesting right atrial pressure of 3 mmHg. FINDINGS  Left Ventricle: Left ventricular ejection fraction, by estimation, is 60 to 65%. The left ventricle has normal function. The left ventricle has no regional wall motion abnormalities. The left ventricular internal cavity size was normal in size. There is  no left ventricular hypertrophy. Left ventricular diastolic parameters are indeterminate. Right Ventricle: The right ventricular size is normal. No increase in right ventricular wall thickness. Right ventricular systolic function is normal. Left Atrium: Left atrial size was normal in size. Right Atrium: Right atrial size was normal in size. Pericardium: Successful pericardiocentesis with removal of 1100 cc serosanguinous pericardial fluid. There is no evidence of pericardial effusion. The pericardial effusion is circumferential. There is diastolic collapse of the right ventricular free wall. There is evidence of cardiac tamponade. Mitral Valve: The mitral valve is normal in structure. No evidence of mitral valve regurgitation. No evidence of mitral valve stenosis. Tricuspid Valve: The tricuspid valve is normal in structure. Tricuspid valve regurgitation is not demonstrated. No evidence of tricuspid stenosis. Aortic Valve: The aortic valve is normal in structure. Aortic valve regurgitation is not visualized. No aortic stenosis is present. Pulmonic Valve: The pulmonic valve was normal in structure. Pulmonic valve regurgitation is not visualized. No evidence of pulmonic stenosis. Aorta: The aortic root is normal in size and structure. Venous: The inferior vena cava is normal in size with greater than 50% respiratory variability, suggesting right atrial  pressure of 3 mmHg. IAS/Shunts: No atrial level shunt detected by color flow Doppler.  LEFT VENTRICLE PLAX 2D LVIDd:         3.85 cm LVIDs:  3.10 cm LV PW:         0.75 cm LV IVS:        1.10 cm  RIGHT VENTRICLE RV Basal diam:  3.40 cm RV Mid diam:    2.50 cm LEFT ATRIUM         Index       RIGHT ATRIUM           Index LA diam:    4.40 cm 3.18 cm/m  RA Area:     12.50 cm                                 RA Volume:   27.90 ml  20.14 ml/m Marcina Millard MD Electronically signed by Marcina Millard MD Signature Date/Time: 03/06/2023/5:12:27 PM    Final    CARDIAC CATHETERIZATION  Result Date: 03/06/2023 Successful pericardiocentesis with placement of pigtail catheter   ECHOCARDIOGRAM COMPLETE  Result Date: 03/06/2023    ECHOCARDIOGRAM REPORT   Patient Name:   Cody [name]    Date of Exam: 03/05/2023 Medical Rec #:  [id number]  Height:       57.0 in Accession #:    [id number] Weight:       100.0 lb Date of Birth:  [birth year] BSA:          1.340 m Patient Age:    76 years   BP:           123/76 mmHg Patient Gender: M          HR:           74 bpm. Exam Location:  [facility] Procedure: 2D Echo, Cardiac Doppler and Color Doppler Indications:     I31.3 Pericardial Effusion  History:         Patient has prior history of Echocardiogram examinations, most                  recent 07/27/2018. CHF; Risk Factors:Hypertension, Current                  Smoker and Alcohol Abuse.  Sonographer:     Daphine Deutscher RDCS Referring Phys:  [id number] Verdene Lennert Diagnosing Phys: Yvonne Kendall MD IMPRESSIONS  1. Left ventricular ejection fraction, by estimation, is 55 to 60%. The left ventricle has normal function. The left ventricle has no regional wall motion abnormalities. Left ventricular diastolic parameters are consistent with Grade I diastolic dysfunction (impaired relaxation).  2. Right ventricular systolic function is mildly reduced. The right ventricular size is normal. There is normal pulmonary artery systolic  pressure.  3. Large pericardial effusion. The pericardial effusion is circumferential. Findings are consistent with cardiac tamponade.  4. The mitral valve is normal in structure. Mild mitral valve regurgitation.  5. Tricuspid valve regurgitation is mild to moderate.  6. The aortic valve is tricuspid. There is mild calcification of the aortic valve. There is mild thickening of the aortic valve. Aortic valve regurgitation is mild to moderate.  7. The inferior vena cava is dilated in size with <50% respiratory variability, suggesting right atrial pressure of 15 mmHg. Conclusion(s)/Recommendation(s): Findings were conveyed to Dr. Gillis Santa at 8:02 AM on 03/06/2023 by Dr. Cristal [place]. FINDINGS  Left Ventricle: Left ventricular ejection fraction, by estimation, is 55 to 60%. The left ventricle has normal function. The left ventricle has no regional wall motion abnormalities. The left ventricular internal cavity size was normal in size. There is  no left ventricular hypertrophy. Left ventricular diastolic parameters are consistent with Grade I diastolic dysfunction (impaired relaxation). Right Ventricle: The right ventricular size is normal. No increase in right ventricular wall thickness. Right ventricular systolic function is mildly reduced. There is normal pulmonary artery systolic pressure. The tricuspid regurgitant velocity is 1.71 m/s, and with an assumed right atrial pressure of 15 mmHg, the estimated right ventricular systolic pressure is 26.7 mmHg. Left Atrium: Left atrial size was normal in size. Right Atrium: Right atrial size was normal in size. Pericardium: A large pericardial effusion is present. The pericardial effusion is circumferential. The pericardial effusion appears to contain fibrous material. There is diastolic collapse of the right ventricular free wall and excessive respiratory variation in the mitral valve spectral Doppler velocities. There is evidence of cardiac tamponade. Mitral Valve: The  mitral valve is normal in structure. There is mild thickening of the mitral valve leaflet(s). Mild mitral valve regurgitation. Tricuspid Valve: The tricuspid valve is normal in structure. Tricuspid valve regurgitation is mild to moderate. Aortic Valve: The aortic valve is tricuspid. There is mild calcification of the aortic valve. There is mild thickening of the aortic valve. Aortic valve regurgitation is mild to moderate. Aortic regurgitation PHT measures 507 msec. Aortic valve mean gradient measures 2.5 mmHg. Aortic valve peak gradient measures 4.3 mmHg. Aortic valve area, by VTI measures 2.84 cm. Pulmonic Valve: The pulmonic valve was normal in structure. Pulmonic valve regurgitation is trivial. No evidence of pulmonic stenosis. Aorta: The aortic root and ascending aorta are structurally normal, with no evidence of dilitation. Pulmonary Artery: The pulmonary artery is of normal size. Venous: The inferior vena cava is dilated in size with less than 50% respiratory variability, suggesting right atrial pressure of 15 mmHg. IAS/Shunts: The interatrial septum was not well visualized.  LEFT VENTRICLE PLAX 2D LVIDd:         3.50 cm   Diastology LVIDs:         2.50 cm   LV e' medial:    4.46 cm/s LV PW:         0.90 cm   LV E/e' medial:  9.7 LV IVS:        0.80 cm   LV e' lateral:   6.31 cm/s LVOT diam:     2.00 cm   LV E/e' lateral: 6.9 LV SV:         46 LV SV Index:   34 LVOT Area:     3.14 cm  RIGHT VENTRICLE RV Basal diam:  3.10 cm RV S prime:     7.29 cm/s TAPSE (M-mode): 1.3 cm LEFT ATRIUM             Index        RIGHT ATRIUM           Index LA diam:        3.10 cm 2.31 cm/m   RA Area:     10.70 cm LA Vol (A2C):   37.7 ml 28.14 ml/m  RA Volume:   27.40 ml  20.45 ml/m LA Vol (A4C):   25.3 ml 18.88 ml/m LA Biplane Vol: 33.9 ml 25.30 ml/m  AORTIC VALVE AV Area (Vmax):    2.86 cm AV Area (Vmean):   2.60 cm AV Area (VTI):     2.84 cm AV Vmax:           103.69 cm/s AV Vmean:          74.930 cm/s AV VTI:  0.161 m AV Peak Grad:      4.3 mmHg AV Mean Grad:      2.5 mmHg LVOT Vmax:         94.33 cm/s LVOT Vmean:        62.000 cm/s LVOT VTI:          0.145 m LVOT/AV VTI ratio: 0.90 AI PHT:            507 msec  AORTA Ao Root diam: 3.20 cm Ao Asc diam:  3.50 cm MITRAL VALVE               TRICUSPID VALVE MV Area (PHT): 4.28 cm    TR Peak grad:   11.7 mmHg MV Decel Time: 177 msec    TR Vmax:        171.00 cm/s MV E velocity: 43.23 cm/s MV A velocity: 63.73 cm/s  SHUNTS MV E/A ratio:  0.68        Systemic VTI:  0.15 m                            Systemic Diam: 2.00 cm Yvonne Kendall MD Electronically signed by Yvonne Kendall MD Signature Date/Time: 03/06/2023/8:13:55 AM    Final    MR Brain W and Wo Contrast  Result Date: 03/05/2023 CLINICAL DATA:  Mental status change, unknown cause EXAM: MRI HEAD WITHOUT AND WITH CONTRAST TECHNIQUE: Multiplanar, multiecho pulse sequences of the brain and surrounding structures were obtained without and with intravenous contrast. CONTRAST:  5mL GADAVIST GADOBUTROL 1 MMOL/ML IV SOLN COMPARISON:  Same day CT head FINDINGS: Brain: Negative for an acute infarct. No hydrocephalus. No extra-axial fluid collection. Small microhemorrhage in the right lentiform nucleus. Chronic left cerebellar infarct. There is sequela mild-to-moderate chronic microvascular ischemic change. There are multiple lesions, including- -1.0 x 0.7 cm contrast-enhancing lesion in the right frontal lobe with surrounding vasogenic edema (series 18, image 108) - 0.8 x 0.7 cm contrast-enhancing lesions in in the left occipital lobe with surrounding vasogenic edema (series 18, image 69). -1.2 x 1.1 cm contrast-enhancing lesion in the superior right cerebellum mild surrounding vasogenic edema (series 18, image 50) -0.6 x 0.4 cm dural-based contrast-enhancing lesion along the left frontal convexity (series 93, image 176). Vascular: Normal flow voids. Skull and upper cervical spine: Normal marrow signal. Sinuses/Orbits: No  middle ear or mastoid effusion. Mild mucosal thickening bilateral ethmoid air cells. Orbits are unremarkable. Other: None. IMPRESSION: 1. Multiple contrast-enhancing lesions in the right frontal lobe, left occipital lobe, and superior right cerebellum with surrounding vasogenic edema. 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 2. Small dural-based contrast-enhancing lesion along the left frontal convexity could represent a small meningimoa, but metastatic disease is not excluded. Electronically Signed   By: Lorenza Cambridge M.D.   On: 03/05/2023 16:48   CT CHEST ABDOMEN PELVIS W CONTRAST  Result Date: 03/05/2023 CLINICAL DATA:  Brain mass. Assess for occult malignancy. * Tracking Code: BO * EXAM: CT CHEST, ABDOMEN, AND PELVIS WITH CONTRAST TECHNIQUE: Multidetector CT imaging of the chest, abdomen and pelvis was performed following the standard protocol during bolus administration of intravenous contrast. RADIATION DOSE REDUCTION: This exam was performed according to the departmental dose-optimization program which includes automated exposure control, adjustment of the mA and/or kV according to patient size and/or use of iterative reconstruction technique. CONTRAST:  75mL OMNIPAQUE IOHEXOL 300 MG/ML  SOLN COMPARISON:  Chest CT without contrast 04/28/2015. X-ray chest earlier 03/05/2023 FINDINGS: CT CHEST  FINDINGS Cardiovascular: Very large pericardial effusion. The heart itself is nonenlarged. Significant coronary artery calcifications. The thoracic aorta has a normal course and caliber with mild atherosclerotic calcified plaque. Mediastinum/Nodes: Normal caliber thoracic esophagus. Preserved thyroid gland. No specific abnormal lymph node enlargement seen including in the axillary regions, hilum. Prominent right paratracheal node on series 2, image 18 measures 18 by 7 mm. This could be 2 adjacent nodes. Subcarinal node series 2, image 29 measures 2.7 by 1.1 cm, mildly enlarged. Lungs/Pleura: Moderate  right and small left effusion. There are some loculated components on the left with fluid tracking along the interlobar fissure. Adjacent parenchymal opacities. Atelectasis versus infiltrate. There is a opacity specifically in the lingula as well. Scattered ground-glass elsewhere in the visualized lungs. No pneumothorax. No obvious mass. Significant breathing motion throughout the examination. A subtle mass lesion could be obscured by the lung opacity in the effusions. Follow up study could be performed after improvement of the effusions. Musculoskeletal: Mild degenerative changes along the spine. CT ABDOMEN PELVIS FINDINGS Hepatobiliary: Contrast exam in the abdomen is more in the late arterial phase and portal venous phase. Patent portal vein. Gallbladder is present. Small cysts seen in segment 4 of the liver. Pancreas: Unremarkable. No pancreatic ductal dilatation or surrounding inflammatory changes. Spleen: Normal in size without focal abnormality. Adrenals/Urinary Tract: Slight nonspecific thickening of the adrenal glands but unchanged from previous. No enhancing renal mass or collecting system dilatation. There are some small low-attenuation lesions in the left kidney which are too small to completely characterize but likely benign cysts, Bosniak 2 lesions. No specific imaging follow-up. The ureters have normal course and caliber extending down to the bladder. Diffuse wall thickening of the urinary bladder. Stomach/Bowel: On this non oral contrast exam, large bowel has a normal caliber. Redundant course of the sigmoid colon. There are some areas of mild wall thickening along loops of bowel, nonspecific. The stomach is mildly distended with fluid. Mild fold thickening suggested small bowel is nondilated. Vascular/Lymphatic: Normal caliber aorta and IVC with moderate calcified plaque along the aorta and iliac vessels. There are several enlarged nodes identified in the retroperitoneum and upper abdomen. Example  aortocaval node posteriorly on series 2, image 64 measures 17 by 13 mm. No next of the celiac series 2, image 59 measures 16 by 14 mm. Left para-node anteriorly on series 2, image 68 measures 17 by 8 mm. A few small retrocrural nodes are also identified. Reproductive: Enlarged heterogeneous prostate. Please correlate with patient's PSA. Other: Anasarca.  Mesenteric haziness.  Trace ascites.  No free air Musculoskeletal: Mild degenerative changes along the spine. IMPRESSION: Large pericardial effusion. Please correlate with and new symptoms including for tamponade. Moderate right and small left effusion with some loculated components on the left. Adjacent parenchymal opacities. Atelectasis versus infiltrate. Recommend follow up after clearance of the effusions. Few borderline enlarged lymph nodes identified including mediastinum, upper retroperitoneum. Enlarged prostate with mass effect along the bladder with bladder wall thickening. Please correlate with patient's PSA. No bowel obstruction or free air. There is scattered mesenteric haziness with trace ascites. There are some small areas of wall thickening along the colon but this could simply relate to the other signs of edema. Electronically Signed   By: Karen Kays M.D.   On: 03/05/2023 15:03   DG Chest Port 1 View  Result Date: 03/05/2023 CLINICAL DATA:  Shortness of breath EXAM: PORTABLE CHEST 1 VIEW COMPARISON:  07/27/2018 FINDINGS: Cardiac silhouette is enlarged with diffuse interstitial opacities throughout both lungs  compatible with edema related to CHF. Basilar atelectasis noted, worse on the left. Difficult to exclude small effusions. Also left lower lobe retrocardiac opacity obscures the left hemidiaphragm concerning for possible superimposed pneumonia. No pneumothorax. Trachea midline. Aorta atherosclerotic. No acute osseous finding. IMPRESSION: 1. Cardiomegaly with diffuse interstitial edema pattern. 2. Left lower lobe airspace consolidation  concerning for superimposed pneumonia. 3. Suspect small effusions. Electronically Signed   By: Judie Petit.  Shick M.D.   On: 03/05/2023 13:20   CT Head Wo Contrast  Result Date: 03/05/2023 CLINICAL DATA:  Mental status change of unknown cause. Memory disturbance. EXAM: CT HEAD WITHOUT CONTRAST TECHNIQUE: Contiguous axial images were obtained from the base of the skull through the vertex without intravenous contrast. RADIATION DOSE REDUCTION: This exam was performed according to the departmental dose-optimization program which includes automated exposure control, adjustment of the mA and/or kV according to patient size and/or use of iterative reconstruction technique. COMPARISON:  07/27/2018 FINDINGS: Brain: No focal abnormality affects the brainstem. Question mild edema and mass effect in the inferior right cerebellum. 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 There is abnormal vasogenic edema within the right posterior frontal region worrisome for an underlying mass lesion. In the left hemisphere, there is abnormal vasogenic edema in the occipital lobe worrisome for the presence of an occult mass lesion. No visible hemorrhage. Hydrocephalus or extra-axial collection. Vascular: There is atherosclerotic calcification of the major vessels at the base of the brain. Skull: No calvarial abnormality. Sinuses/Orbits: Clear/normal Other: None IMPRESSION: 1. Abnormal vasogenic edema in the right posterior frontal region and in the left occipital lobe worrisome for the presence of occult mass lesions. Question mild edema and mass effect in the inferior right cerebellum. No visible hemorrhage. MRI with and without contrast is recommended for further evaluation. 2. 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 3. Atherosclerotic calcification of the major vessels at the base of the brain. Electronically Signed   By: Paulina Fusi M.D.   On: 03/05/2023 12:52     TELEMETRY reviewed by me (LT) 03/07/2023 : AF rate  50s-70s  EKG reviewed by me: Sinus rhythm with low voltage  Data reviewed by me (LT) 03/07/2023: PCCM note, nephrology note, neurosurgery note, hospitalist progress note last 24h vitals tele labs imaging I/O   Principal Problem:   Acute encephalopathy Active Problems:   Congestive heart failure (HCC)   Chronic obstructive pulmonary disease (HCC)   Acute hyponatremia   Pericardial effusion   Vasogenic edema (HCC)   Brain mass   Pleural effusion, bilateral   Alcohol use disorder   BPH (benign prostatic hyperplasia)   Elevated LFTs   Pneumonia of left lower lobe due to infectious organism   Metastasis to brain [facility])    ASSESSMENT AND PLAN:  Cody [name] is a 77yo [place] male with a PMH of HFpEF, alcohol use disorder, ongoing tobacco abuse, HTN who presented to [facility] ED 03/05/2023 with altered mental status (speaking nonsensically and having difficulty with his memory), generalized weakness, shortness of breath, and left-sided chest discomfort.  He was admitted to stepdown and head CT obtained showed abnormal vasogenic edema in the right posterior frontal region and left occipital lobe worrisome for mass lesions.  CT chest abdomen pelvis revealed a large pericardial effusion, confirmed by echo this morning to be a large circumferential pericardial effusion with findings consistent with cardiac tamponade.  # Altered mental status # Vasogenic edema with c/f brain metastasis Presents with 1 week of nonsensical speech and memory difficulty per family,  head CT and brain MRI concerning for vasogenic edema and brain mets with unknown primary although, CT chest abdomen pelvis suspicious for prostate enlargement.  Started on steroids and 3% normal saline per nephrology.  Agree with current therapy per primary, oncology, and nephrology. -Cytology pending  # Large pericardial effusion with C/F cardiac tamponade # S/p urgent pericardiocentesis 5/8 Initial CT chest abdomen pelvis showed a large pericardial  effusion, with echo confirming large circumferential pericardial effusion with findings concerning for cardiac tamponade.  S/p urgent pericardiocentesis with 1.1 L of bloody fluid aspirate and pigtail catheter left in place.  Repeat limited echo showed much improvement in the pericardial effusion to a moderate-sized effusion today.  Dr. Darrold Junker aspirated an additional ~200 cc of bloody fluid at the bedside.   -Keep pigtail catheter in place for today. -Dr. Darrold Junker and I will reassess/attempt to aspirate additional effusion tomorrow and likely remove the drain -If recurrence in pericardial fluid, he may need to be transferred to a tertiary facility for a pericardial window, if this remains within the family and patient's goals of care. -Cytology pending.  # AF with slow ventricular response Developed following pericardiocentesis, controlled ventricular rate on telemetry today.  [place] not recommend heparin with bloody fluid aspirate.  Further management per primary cardiology team (Dr. Kela Millin, NP)  # Alcohol use disorder Per family, the patient drinks 6-7 beers per day and had not had alcohol 2 days prior to admission because he was feeling poorly.  CIWA protocol per primary.  Complete cessation recommended  # Tobacco abuse Complete cessation advised.  This patient's plan of care was discussed and created with Dr. Darrold Junker and he is in agreement.  Signed: Rebeca Allegra , PA-C 03/07/2023, 2:10 PM [facility]

## 2023-03-07 NOTE — Progress Notes (Signed)
*  PRELIMINARY RESULTS* Echocardiogram 2D Echocardiogram has been performed.  Cristela Blue 03/07/2023, 8:23 AM

## 2023-03-07 NOTE — Procedures (Signed)
Thoracentesis  Procedure Note  Cody [name]  [id number]  1946/02/20  Date:03/07/23  Time:3:55 PM   Provider Performing:Euclid Cassetta   Procedure: Thoracentesis with imaging guidance (40981)  Indication(s) Pleural Effusion  Consent Risks of the procedure as well as the alternatives and risks of each were explained to the patient and/or caregiver.  Consent for the procedure was obtained and is signed in the bedside chart  Anesthesia Topical only with 1% lidocaine    Time Out Verified patient identification, verified procedure, site/side was marked, verified correct patient position, special equipment/implants available, medications/allergies/relevant history reviewed, required imaging and test results available.   Sterile Technique Maximal sterile technique including full sterile barrier drape, hand hygiene, sterile gown, sterile gloves, mask, hair covering, sterile ultrasound probe cover (if used).  Procedure Description Ultrasound was used to identify appropriate pleural anatomy for placement and overlying skin marked.  Area of drainage cleaned and draped in sterile fashion. Lidocaine was used to anesthetize the skin and subcutaneous tissue.  500 cc's of serosanguinous appearing fluid was drained from the right pleural space. Catheter then removed and bandaid applied to site.   Complications/Tolerance None; patient tolerated the procedure well. Chest X-ray is ordered to confirm no post-procedural complication.   EBL Minimal   Specimen(s) Pleural fluid     Raechel Chute, MD [facility] 03/07/2023 3:56 PM

## 2023-03-07 NOTE — Progress Notes (Signed)
Eeg done 

## 2023-03-07 NOTE — Consult Note (Addendum)
MEDICATION RELATED CONSULT NOTE  Pharmacy Consult for hypertonic saline monitoring Indication: hyponatremia  Allergies  Allergen Reactions   Shellfish Allergy Hives    Only allergic to crabs    Patient Measurements: Height: 5' (152.4 cm) Weight: 45 kg (99 lb 3.3 oz) IBW/kg (Calculated) : 50   Vital Signs: Temp: 97.7 F (36.5 C) (05/08 1930) Temp Source: Axillary (05/08 1930) BP: 103/61 (05/08 2000) Pulse Rate: 57 (05/08 2300) Intake/Output from previous day: 05/08 0701 - 05/09 0700 In: 1041 [P.O.:420; I.V.:246.3; IV Piggyback:374.7] Out: 650 [Urine:650] Intake/Output from this shift: No intake/output data recorded.  Labs: Recent Labs    03/05/23 1112 03/06/23 0258 03/06/23 0849  WBC 13.2* 11.3*  --   HGB 10.9* 10.9*  --   HCT 32.8* 32.2*  --   PLT 357 309  --   CREATININE 1.18 0.99  --   MG  --   --  2.5*  PHOS  --   --  4.3  ALBUMIN 3.0* 2.7*  --   PROT 7.5 6.7  --   AST 333* 254*  --   ALT 214* 217*  --   ALKPHOS 84 82  --   BILITOT 1.0 0.9  --     Estimated Creatinine Clearance: 41 mL/min (by C-G formula based on SCr of 0.99 mg/dL).   Medical History: Past Medical History:  Diagnosis Date   Alcohol dependence (HCC)    CHF (congestive heart failure) (HCC)    Hypertension    Tobacco abuse     Assessment: 77 yo male brought to the emergency department by family due to memory loss, falls, and change in mental status.  Sodium found to be low at 117 in the ED.  Provider has order hypertonic saline infusion for correction.  Goal of Therapy:  Goal for acute hyponatremia: Increase Na by 4-6 mEq/L in 4-6 hours Goal for 24 hour correction generally no more than 8-10 mEq/L  Na Levels:  05/07 1112 Na 117 05/07 1422 NaCl 3% @ 25 ml/hr started 05/07 1656 Na 118 05/07 2213 Na 123  05/08 0258 Na 124  05/08 0849 Na 125 05/08 1455 Na 126 05/08 1806 Na 130 - RN to hold infusion per nephrology 05/08 2250 Na 129  05/09 0308 Na 130  Plan:  Na increase  last 24h: 12 mmol/L Currently ordered NaCl 3% @ 25 ml/hr.  After discussion with nephrology. Per RN, 3% NaCl paused on 5/8 @ ~ 1200 Continue to monitor q4h   Nylee Barbuto D, PharmD 03/07/2023,12:57 AM

## 2023-03-07 NOTE — Progress Notes (Addendum)
[facility] Progress Note  Patient: Cody [name]    UJW:[id number]  DOA: 03/05/2023     Date of Service: the patient was seen and examined on 03/07/2023  Chief Complaint  Patient presents with   Memory Loss   Brief hospital course: Montey [name] is a 77 y.o. male with medical history significant of hypertension, HFpEF, alcohol use disorder, BPH, who presents to the ED due to altered mental status.  Patient's son stated that he is having shortness of breath for past 1 week productive cough.  For past 2 to 3 days patient is talking randomly which does not make any sense and having memory issues.  Patient drinks 10 beers per day and active smoker. ED w/up: VS stable Hyponatremia sodium 117, mild acidosis CO2 18, mild hyperglycemia blood glucose 154, elevated AST and ALT, leukocytosis WBC 13.2 CXR:  Cardiomegaly with diffuse interstitial edema pattern. Left lower lobe airspace consolidation concerning for superimposed pneumonia. Suspect small effusions. CT head:  old CVA and vasogenic edema MRI brain: . Multiple contrast-enhancing lesions in the right frontal lobe, left occipital lobe, and superior right cerebellum with surrounding vasogenic edema. 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 Small dural-based contrast-enhancing lesion along the left frontal convexity could represent a small meningimoa, but metastatic disease is not excluded. CT Chest/a/p: Large pericardial effusion. Please correlate with and new symptoms including for tamponade.   Moderate right and small left effusion with some loculated components on the left. Adjacent parenchymal opacities. Atelectasis versus infiltrate. Recommend follow up after clearance of the effusions. Few borderline enlarged lymph nodes identified including mediastinum, upper retroperitoneum. Enlarged prostate with mass effect along the bladder with bladder wall thickening. Please correlate with patient's PSA. No bowel obstruction or free air.  There is scattered mesenteric haziness with trace ascites. There are some small areas of wall thickening along the colon but this could simply relate to the other signs of edema.  Assessment and Plan:  # Acute metabolic encephalopathy most likely due to metastatic brain lesions and vasogenic edema Symptoms improving, currently patient is back to his baseline Patient was agitated last night on 5/8 the patient was given lorazepam 4 mg, trazodone 50 mg And melatonin 5 mg the patient was very sleepy in the morning on 5/9.  CT head was done did not show any acute/new findings. Pulmonary critical care was consulted, ABG was done, EEG was done, patient will stay in the stepdown unit for close monitoring.  Very high risk to decompensate.   # Metastatic brain lesion with vasogenic edema CT scan and MRI brain reviewed S/p Decadron 8 mg IV x 1 dose, followed by Decadron 4 mg p.o. daily, Continue Keppra 500 mg IV twice daily for seizure prophylaxis Neurosurgery consulted, recommended no intervention at this time but if there is no diagnosis then brain biopsy can be done next week. 5/9 CT Head: Unchanged edema associated with known lesions in the right frontal lobe, left occipital lobe, and right cerebellum. No significant mass effect. No evidence of a new intracranial abnormality.   # Pericardial effusion CT chest shows large pericardial effusion 2D echocardiogram showed large pericardial fusion with possible tamponade Cardiology was consulted, s/p pericardiocentesis 1.1 liters bloody fluid was tapped and sample was sent for cytology Pericardial catheter intact, may need drainage again tomorrow a.m. Cardiology consult appreciated Patient feels significant improvement in the shortness of breath 5/9 200 cc pericardial fluid was drained by cardiology   # Acute onset A-fib with controlled ventricular rate, developed after pericardiocentesis Continue  monitor on telemetry Cardiology following, no need  of anticoagulation at this time   # Bilateral pleural effusion, right >left most likely due to metastatic disease Unknown primary Patient got pericardiocentesis done today so we will postpone thoracentesis CT chest/AP reviewed as above Oncologist consulted, recommended therapeutic thoracentesis, sample for cytology cell count and other routine labs. Patient may have underlying lung cancer due to history of smoking. 5/9 CXR: Slight worsening of bilateral lung opacities which may reflect edema or pneumonia.  Persistent bilateral pleural effusions. Continue ceftriaxone and doxycycline for possible underlying pneumonia   # Isotonic hyponatremia Serum osmolality 276 at lower end Hyponatremia most likely secondary to alcohol use disorder and possible SIADH due to underlying malignancy Sodium level 117 on admission Continue 3% saline, monitor serum sodium level closely Na 131 gradually improving Nephrology consulted for further recommendation   # History of gout, home meds colchicine and allopurinol, seems patient is noncompliant. Uric acid 9.7, elevated Started colchicine 0.6 mg p.o. daily Hold allopurinol for now   # CAD, HTN and chronic diastolic CHF Patient was on aspirin, Coreg, Entresto, statin, med rec shows patient is not taking medication seems noncompliant. Continue to monitor BP and titrate medications accordingly   # COPD, possible underlying pneumonia Continue empiric antibiotics ceftriaxone azithromycin Continue Decadron Continue DuoNeb as needed   # Alcohol use disorder Long-term history of alcohol use disorder that is currently active.  No evidence of withdrawal on exam at this time. - CIWA protocol with Ativan as needed - TOC consultation for resources - Daily folic acid, thiamine and multivitamin  # BPH and urinary retention, Foley catheter was inserted on 5/8, 620 mL urine retention as per bladder scan Continue Flomax and finasteride  # Elevated LFTs most  likely secondary to alcohol use disorder Monitor LFTs   Body mass index is 19.38 kg/m.  Interventions:       Diet: Currently n.p.o. due to AMS, we will advance diet as per improvement.   DVT Prophylaxis: SCD, pharmacological prophylaxis contraindicated due to procedure brain mets/vasogenic edema    Advance goals of care discussion: Full code  Family Communication: family was present at bedside, at the time of interview.  The pt provided permission to discuss medical plan with the family. Opportunity was given to ask question and all questions were answered satisfactorily.   Disposition:  Pt is from Home, admitted with pericardial fusion, bilateral pleural effusion, metastatic disease, brain mets with vasogenic edem, which precludes a safe discharge. Discharge to Home, when stable, may need 5 to 7 days to improve..  Subjective: Overnight patient was very restless and agitated, so patient received 4 mg of Ativan, trazodone and melatonin for sleep.  In the morning time patient was very sleepy and unable to wake up with sternal rub and supraorbital pressure.  Patient was in deep sleep. Management plan discussed with patient's family at bedside.   Physical Exam: General: NAD, lying comfortably Appear in no distress Eyes: closed ENT: Oral Mucosa Clear, dry Neck: no JVD,  Cardiovascular: Irregular rhythm, no Murmur,  Respiratory: Good air entry bilaterally, bibasilar crackles, no wheezing appreciated.   Abdomen: Bowel Sound present, Soft and no tenderness,  Skin: no rashes Extremities: no Pedal edema, no calf tenderness Neurologic: without any new focal findings Gait not checked due to patient safety concerns  Vitals:   03/07/23 1000 03/07/23 1100 03/07/23 1200 03/07/23 1430  BP: (!) 97/58 (!) 105/55 (!) 103/51 (!) 101/59  Pulse: 71 66 78 71  Resp: 15 18 14  17  Temp:      TempSrc:      SpO2: 99% 99% 100% 100%  Weight:      Height:        Intake/Output Summary (Last 24  hours) at 03/07/2023 1520 Last data filed at 03/07/2023 1245 Gross per 24 hour  Intake 1212.29 ml  Output 1100 ml  Net 112.29 ml   Filed Weights   03/06/23 0000 03/06/23 0500 03/07/23 0500  Weight: 45 kg 45 kg 48.9 kg    Data Reviewed: I have personally reviewed and interpreted daily labs, tele strips, imagings as discussed above. I reviewed all nursing notes, pharmacy notes, vitals, pertinent old records I have discussed plan of care as described above with RN and patient/family.  CBC: Recent Labs  Lab 03/05/23 1112 03/06/23 0258 03/07/23 0308  WBC 13.2* 11.3* 14.2*  NEUTROABS  --  9.2*  --   HGB 10.9* 10.9* 10.0*  HCT 32.8* 32.2* 31.0*  MCV 93.4 91.5 96.9  PLT 357 309 274   Basic Metabolic Panel: Recent Labs  Lab 03/05/23 1112 03/05/23 1656 03/06/23 0258 03/06/23 0849 03/06/23 1455 03/06/23 1806 03/06/23 2250 03/07/23 0308 03/07/23 0719 03/07/23 1221  NA 117*   < > 124* 125*   < > 130* 129* 130* 129* 131*  K 4.9  --  5.0  --   --   --   --  4.5  --   --   CL 87*  --  95*  --   --   --   --  102  --   --   CO2 18*  --  17*  --   --   --   --  18*  --   --   GLUCOSE 154*  --  129*  --   --   --   --  100*  --   --   BUN 39*  --  42*  --   --   --   --  43*  --   --   CREATININE 1.18  --  0.99  --   --   --   --  0.89  --   --   CALCIUM 8.5*  --  8.5*  --   --   --   --  8.0*  --   --   MG  --   --   --  2.5*  --   --   --  2.5*  --   --   PHOS  --   --   --  4.3  --   --   --  4.5  --   --    < > = values in this interval not displayed.    Studies: ECHOCARDIOGRAM LIMITED  Result Date: 03/07/2023    ECHOCARDIOGRAM LIMITED REPORT   Patient Name:   Andri Diebold    Date of Exam: 03/07/2023 Medical Rec #:  [id number]  Height:       60.0 in Accession #:    [id number] Weight:       107.8 lb Date of Birth:  [birth year] BSA:          1.435 m Patient Age:    76 years   BP:           92/60 mmHg Patient Gender: M          HR:           87 bpm. Exam Location:  [facility] Procedure:  Limited  Echo, Color Doppler and Cardiac Doppler Indications:     Tamponade I31.4  History:         Patient has prior history of Echocardiogram examinations, most                  recent 03/06/2023. CHF; Risk Factors:Hypertension. Tobacco abuse.  Sonographer:     Cristela Blue Referring Phys:  [id number] CARALYN HUDSON Diagnosing Phys: Marcina Millard MD IMPRESSIONS  1. Left ventricular ejection fraction, by estimation, is 60 to 65%. The left ventricle has normal function. The left ventricle has no regional wall motion abnormalities. Indeterminate diastolic filling due to E-A fusion.  2. Right ventricular systolic function is normal. The right ventricular size is normal.  3. Moderate pericardial effusion.  4. The mitral valve is normal in structure. Mild mitral valve regurgitation. No evidence of mitral stenosis.  5. The aortic valve is normal in structure. Aortic valve regurgitation is mild. No aortic stenosis is present.  6. The inferior vena cava is normal in size with greater than 50% respiratory variability, suggesting right atrial pressure of 3 mmHg. FINDINGS  Left Ventricle: Left ventricular ejection fraction, by estimation, is 60 to 65%. The left ventricle has normal function. The left ventricle has no regional wall motion abnormalities. The left ventricular internal cavity size was normal in size. There is  no left ventricular hypertrophy. Indeterminate diastolic filling due to E-A fusion. Right Ventricle: The right ventricular size is normal. No increase in right ventricular wall thickness. Right ventricular systolic function is normal. Left Atrium: Left atrial size was normal in size. Right Atrium: Right atrial size was normal in size. Pericardium: Pericardial effusion much reduced after pericardiocenetesis. No right atrial or right ventricular diastolic collapse. No evidence for preicardial tamonade. A moderately sized pericardial effusion is present. Mitral Valve: The mitral valve is normal in structure. Mild mitral  valve regurgitation. No evidence of mitral valve stenosis. Tricuspid Valve: The tricuspid valve is normal in structure. Tricuspid valve regurgitation is mild . No evidence of tricuspid stenosis. Aortic Valve: The aortic valve is normal in structure. Aortic valve regurgitation is mild. Aortic regurgitation PHT measures 436 msec. No aortic stenosis is present. Pulmonic Valve: The pulmonic valve was normal in structure. Pulmonic valve regurgitation is not visualized. No evidence of pulmonic stenosis. Aorta: The aortic root is normal in size and structure. Venous: The inferior vena cava is normal in size with greater than 50% respiratory variability, suggesting right atrial pressure of 3 mmHg. IAS/Shunts: No atrial level shunt detected by color flow Doppler. Additional Comments: Spectral Doppler performed. Color Doppler performed.  LEFT VENTRICLE PLAX 2D LVIDd:         4.40 cm LVIDs:         2.80 cm LV PW:         0.90 cm LV IVS:        0.80 cm LVOT diam:     2.00 cm LVOT Area:     3.14 cm  LEFT ATRIUM         Index LA diam:    2.80 cm 1.95 cm/m  AORTIC VALVE AI PHT:      436 msec  AORTA Ao Root diam: 3.00 cm MITRAL VALVE                TRICUSPID VALVE MV Area (PHT): 4.99 cm     TR Peak grad:   24.0 mmHg MV Decel Time: 152 msec     TR Vmax:  245.00 cm/s MV E velocity: 126.00 cm/s                             SHUNTS                             Systemic Diam: 2.00 cm Marcina Millard MD Electronically signed by Marcina Millard MD Signature Date/Time: 03/07/2023/1:09:22 PM    Final    CT HEAD WO CONTRAST ( )  Result Date: 03/07/2023 CLINICAL DATA:  Neuro deficit, acute, stroke suspected. Altered mental status. Memory loss. EXAM: CT HEAD WITHOUT CONTRAST TECHNIQUE: Contiguous axial images were obtained from the base of the skull through the vertex without intravenous contrast. RADIATION DOSE REDUCTION: This exam was performed according to the departmental dose-optimization program which includes automated  exposure control, adjustment of the mA and/or kV according to patient size and/or use of iterative reconstruction technique. COMPARISON:  Head CT and MRI 03/05/2023 FINDINGS: Brain: There is no evidence of an acute cortically based infarct, intracranial hemorrhage, midline shift, or extra-axial fluid collection. The ventricles are normal in size. Mild-to-moderate vasogenic edema in the right frontal lobe, left occipital lobe, and right cerebellar hemisphere is unchanged and without significant associated mass effect. Mild to moderate hypodensities elsewhere in the cerebral white matter bilaterally are nonspecific but compatible with chronic small vessel ischemic disease. 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 Vascular: Calcified atherosclerosis at the skull base. No hyperdense vessel. Skull: No acute fracture or suspicious osseous lesion. Sinuses/Orbits: Minimal mucosal thickening in the paranasal sinuses. Clear mastoid air cells. Unremarkable orbits. Other: None. IMPRESSION: 1. Unchanged edema associated with known lesions in the right frontal lobe, left occipital lobe, and right cerebellum. No significant mass effect. 2. No evidence of a new intracranial abnormality. Electronically Signed   By: Sebastian Ache M.D.   On: 03/07/2023 11:23   DG Chest Port 1 View  Result Date: 03/07/2023 CLINICAL DATA:  Pleural effusion. EXAM: PORTABLE CHEST 1 VIEW COMPARISON:  Chest radiograph and CT 03/05/2023 FINDINGS: The cardiac silhouette remains enlarged. Aortic atherosclerosis is noted. Veiling opacities in the lower lungs are consistent with persistent small to moderate pleural effusions as shown on CT. Bilateral interstitial and patchy airspace opacities have slightly worsened. No pneumothorax is identified. IMPRESSION: 1. Slight worsening of bilateral lung opacities which may reflect edema or pneumonia. 2. Persistent bilateral pleural effusions. Electronically Signed   By: Sebastian Ache M.D.   On:  03/07/2023 09:50   US Venous Img Lower Bilateral (DVT)  Result Date: 03/06/2023 CLINICAL DATA:  Lower extremity edema EXAM: BILATERAL LOWER EXTREMITY VENOUS DOPPLER ULTRASOUND TECHNIQUE: Gray-scale sonography with graded compression, as well as color Doppler and duplex ultrasound were performed to evaluate the lower extremity deep venous systems from the level of the common femoral vein and including the common femoral, femoral, profunda femoral, popliteal and calf veins including the posterior tibial, peroneal and gastrocnemius veins when visible. The superficial great saphenous vein was also interrogated. Spectral Doppler was utilized to evaluate flow at rest and with distal augmentation maneuvers in the common femoral, femoral and popliteal veins. COMPARISON:  None Available. FINDINGS: RIGHT LOWER EXTREMITY Common Femoral Vein: No evidence of thrombus. Normal compressibility, respiratory phasicity and response to augmentation. Saphenofemoral Junction: No evidence of thrombus. Normal compressibility and flow on color Doppler imaging. Profunda Femoral Vein: No evidence of thrombus. Normal compressibility and flow on color Doppler imaging. Femoral Vein:  No evidence of thrombus. Normal compressibility, respiratory phasicity and response to augmentation. Popliteal Vein: No evidence of thrombus. Normal compressibility, respiratory phasicity and response to augmentation. Calf Veins: No evidence of thrombus. Normal compressibility and flow on color Doppler imaging. Superficial Great Saphenous Vein: No evidence of thrombus. Normal compressibility. Venous Reflux:  None. Other Findings:  None. LEFT LOWER EXTREMITY Common Femoral Vein: No evidence of thrombus. Normal compressibility, respiratory phasicity and response to augmentation. Saphenofemoral Junction: No evidence of thrombus. Normal compressibility and flow on color Doppler imaging. Profunda Femoral Vein: No evidence of thrombus. Normal compressibility and flow on  color Doppler imaging. Femoral Vein: No evidence of thrombus. Normal compressibility, respiratory phasicity and response to augmentation. Popliteal Vein: No evidence of thrombus. Normal compressibility, respiratory phasicity and response to augmentation. Calf Veins: No evidence of thrombus. Normal compressibility and flow on color Doppler imaging. Superficial Great Saphenous Vein: No evidence of thrombus. Normal compressibility. Venous Reflux:  None. Other Findings:  None. IMPRESSION: No evidence of deep venous thrombosis in either lower extremity. Electronically Signed   By: Alcide Clever M.D.   On: 03/06/2023 19:43    Scheduled Meds:  Chlorhexidine Gluconate Cloth  6 each Topical Daily   colchicine  0.6 mg Oral Daily   dexamethasone  4 mg Oral Daily   folic acid  1 mg Oral Daily   melatonin  5 mg Oral QHS   multivitamin with minerals  1 tablet Oral Daily   sodium bicarbonate  650 mg Oral TID   sodium chloride flush  3 mL Intravenous Q12H   sodium chloride flush  3 mL Intravenous Q12H   tamsulosin  0.4 mg Oral Daily   thiamine  100 mg Oral Daily   Or   thiamine  100 mg Intravenous Daily   Continuous Infusions:  sodium chloride     sodium chloride 1 mL/kg/hr (03/07/23 1150)   cefTRIAXone (ROCEPHIN)  IV Stopped (03/06/23 1531)   doxycycline (VIBRAMYCIN) IV 100 mg (03/07/23 1402)   levETIRAcetam Stopped (03/07/23 0603)   sodium chloride (hypertonic) Stopped (03/06/23 1904)   PRN Meds: sodium chloride, acetaminophen **OR** acetaminophen, ipratropium-albuterol, ondansetron **OR** ondansetron (ZOFRAN) IV, sodium chloride flush  Time spent: 55 minutes  Author: Gillis Santa. MD [facility] 03/07/2023 3:20 PM  To reach On-call, see care teams to locate the attending and reach out to them via [email or web address]. If 7PM-7AM, please contact night-coverage If you still have difficulty reaching the attending provider, please page the [facility] (Director on Call) for [facility] on amion for  assistance.

## 2023-03-07 NOTE — Progress Notes (Signed)
[facility]  ROUNDING NOTE   Subjective:   Patient seen resting quietly, wife not present Patient denies any pain or discomfort Patient remains on room air Family reports patient remains confused  Objective:  Vital signs in last 24 hours:  Temp:  [97.6 F (36.4 C)-97.8 F (36.6 C)] 97.6 F (36.4 C) (05/09 0800) Pulse Rate:  [49-124] 78 (05/09 1200) Resp:  [14-30] 14 (05/09 1200) BP: (83-128)/(37-87) 103/51 (05/09 1200) SpO2:  [79 %-100 %] 100 % (05/09 1200) Weight:  [48.9 kg] 48.9 kg (05/09 0500)  Weight change: 3.54 kg Filed Weights   03/06/23 0000 03/06/23 0500 03/07/23 0500  Weight: 45 kg 45 kg 48.9 kg    Intake/Output: I/O last 3 completed shifts: In: 1720.4 [P.O.:420; I.V.:652.1; IV Piggyback:648.3] Out: 650 [Urine:650]   Intake/Output this shift:  Total I/O In: 238.5 [I.V.:238.5] Out: 800 [Urine:800]  Physical Exam: General: NAD  Head: Normocephalic, atraumatic. Moist oral mucosal membranes  Eyes: Anicteric  Lungs:  Clear to auscultation, normal effort  Heart: Regular rate and rhythm  Abdomen:  Soft, nontender,   Extremities: No peripheral edema.  Neurologic: Nonfocal, moving all four extremities  Skin: No lesions  Access: None    Basic Metabolic Panel: Recent Labs  Lab 03/05/23 1112 03/05/23 1656 03/06/23 0258 03/06/23 0849 03/06/23 1455 03/06/23 1806 03/06/23 2250 03/07/23 0308 03/07/23 0719 03/07/23 1221  NA 117*   < > 124* 125*   < > 130* 129* 130* 129* 131*  K 4.9  --  5.0  --   --   --   --  4.5  --   --   CL 87*  --  95*  --   --   --   --  102  --   --   CO2 18*  --  17*  --   --   --   --  18*  --   --   GLUCOSE 154*  --  129*  --   --   --   --  100*  --   --   BUN 39*  --  42*  --   --   --   --  43*  --   --   CREATININE 1.18  --  0.99  --   --   --   --  0.89  --   --   CALCIUM 8.5*  --  8.5*  --   --   --   --  8.0*  --   --   MG  --   --   --  2.5*  --   --   --  2.5*  --   --   PHOS  --   --   --  4.3  --    --   --  4.5  --   --    < > = values in this interval not displayed.    Liver Function Tests: Recent Labs  Lab 03/05/23 1112 03/06/23 0258 03/07/23 0308  AST 333* 254* 119*  ALT 214* 217* 142*  ALKPHOS 84 82 59  BILITOT 1.0 0.9 0.7  PROT 7.5 6.7 5.5*  ALBUMIN 3.0* 2.7* 2.3*   No results for input(s): "LIPASE", "AMYLASE" in the last 168 hours. No results for input(s): "AMMONIA" in the last 168 hours.  CBC: Recent Labs  Lab 03/05/23 1112 03/06/23 0258 03/07/23 0308  WBC 13.2* 11.3* 14.2*  NEUTROABS  --  9.2*  --   HGB 10.9*  10.9* 10.0*  HCT 32.8* 32.2* 31.0*  MCV 93.4 91.5 96.9  PLT 357 309 274    Cardiac Enzymes: No results for input(s): "CKTOTAL", "CKMB", "CKMBINDEX", "TROPONINI" in the last 168 hours.  BNP: Invalid input(s): "POCBNP"  CBG: Recent Labs  Lab 03/05/23 2257  GLUCAP 119*    Microbiology: Results for orders placed or performed during the hospital encounter of 03/05/23  Blood culture (routine x 2)     Status: None (Preliminary result)   Collection Time: 03/05/23  2:10 PM   Specimen: BLOOD  Result Value Ref Range Status   Specimen Description BLOOD BLOOD RIGHT ARM  Final   Special Requests   Final    BOTTLES DRAWN AEROBIC AND ANAEROBIC Blood Culture adequate volume   Culture   Final    NO GROWTH 2 DAYS Performed at [facility], [address] [place], [place] [address]    Report Status PENDING  Incomplete  Blood culture (routine x 2)     Status: None (Preliminary result)   Collection Time: 03/05/23  2:10 PM   Specimen: BLOOD  Result Value Ref Range Status   Specimen Description BLOOD BLOOD LEFT ARM  Final   Special Requests   Final    BOTTLES DRAWN AEROBIC AND ANAEROBIC Blood Culture adequate volume   Culture   Final    NO GROWTH 2 DAYS Performed at [facility], [address] [place], [place] [address]    Report Status PENDING  Incomplete  Body fluid culture w Gram Stain     Status: None (Preliminary result)    Collection Time: 03/06/23  9:34 AM   Specimen: PATH Cytology Misc. fluid; Body Fluid  Result Value Ref Range Status   Specimen Description   Final    PERICARDIAL Performed at [facility], [address] [place], [place] [address]    Special Requests   Final    PERICARDIAL Performed at [facility], [address] [place], [place] [address]    Gram Stain NO WBC SEEN NO ORGANISMS SEEN   Final   Culture   Final    NO GROWTH < 24 HOURS Performed at [facility], [address], [place], [place] [address]    Report Status PENDING  Incomplete    Coagulation Studies: Recent Labs    03/06/23 0258  LABPROT 18.3*  INR 1.5*    Urinalysis: No results for input(s): "COLORURINE", "LABSPEC", "PHURINE", "GLUCOSEU", "HGBUR", "BILIRUBINUR", "KETONESUR", "PROTEINUR", "UROBILINOGEN", "NITRITE", "LEUKOCYTESUR" in the last 72 hours.  Invalid input(s): "APPERANCEUR"    Imaging: ECHOCARDIOGRAM LIMITED  Result Date: 03/07/2023    ECHOCARDIOGRAM LIMITED REPORT   Patient Name:   Cody [name]    Date of Exam: 03/07/2023 Medical Rec #:  [id number]  Height:       60.0 in Accession #:    [id number] Weight:       107.8 lb Date of Birth:  02-Feb-1946 BSA:          1.435 m Patient Age:    76 years   BP:           92/60 mmHg Patient Gender: M          HR:           87 bpm. Exam Location:  [facility] Procedure: Limited Echo, Color Doppler and Cardiac Doppler Indications:     Tamponade I31.4  History:         Patient has prior history of Echocardiogram examinations, most  recent 03/06/2023. CHF; Risk Factors:Hypertension. Tobacco abuse.  Sonographer:     Cristela Blue Referring Phys:  [id number] CARALYN HUDSON Diagnosing Phys: Marcina Millard MD IMPRESSIONS  1. Left ventricular ejection fraction, by estimation, is 60 to 65%. The left ventricle has normal function. The left ventricle has no regional wall motion abnormalities. Indeterminate diastolic filling due to E-A fusion.  2. Right  ventricular systolic function is normal. The right ventricular size is normal.  3. Moderate pericardial effusion.  4. The mitral valve is normal in structure. Mild mitral valve regurgitation. No evidence of mitral stenosis.  5. The aortic valve is normal in structure. Aortic valve regurgitation is mild. No aortic stenosis is present.  6. The inferior vena cava is normal in size with greater than 50% respiratory variability, suggesting right atrial pressure of 3 mmHg. FINDINGS  Left Ventricle: Left ventricular ejection fraction, by estimation, is 60 to 65%. The left ventricle has normal function. The left ventricle has no regional wall motion abnormalities. The left ventricular internal cavity size was normal in size. There is  no left ventricular hypertrophy. Indeterminate diastolic filling due to E-A fusion. Right Ventricle: The right ventricular size is normal. No increase in right ventricular wall thickness. Right ventricular systolic function is normal. Left Atrium: Left atrial size was normal in size. Right Atrium: Right atrial size was normal in size. Pericardium: Pericardial effusion much reduced after pericardiocenetesis. No right atrial or right ventricular diastolic collapse. No evidence for preicardial tamonade. A moderately sized pericardial effusion is present. Mitral Valve: The mitral valve is normal in structure. Mild mitral valve regurgitation. No evidence of mitral valve stenosis. Tricuspid Valve: The tricuspid valve is normal in structure. Tricuspid valve regurgitation is mild . No evidence of tricuspid stenosis. Aortic Valve: The aortic valve is normal in structure. Aortic valve regurgitation is mild. Aortic regurgitation PHT measures 436 msec. No aortic stenosis is present. Pulmonic Valve: The pulmonic valve was normal in structure. Pulmonic valve regurgitation is not visualized. No evidence of pulmonic stenosis. Aorta: The aortic root is normal in size and structure. Venous: The inferior vena  cava is normal in size with greater than 50% respiratory variability, suggesting right atrial pressure of 3 mmHg. IAS/Shunts: No atrial level shunt detected by color flow Doppler. Additional Comments: Spectral Doppler performed. Color Doppler performed.  LEFT VENTRICLE PLAX 2D LVIDd:         4.40 cm LVIDs:         2.80 cm LV PW:         0.90 cm LV IVS:        0.80 cm LVOT diam:     2.00 cm LVOT Area:     3.14 cm  LEFT ATRIUM         Index LA diam:    2.80 cm 1.95 cm/m  AORTIC VALVE AI PHT:      436 msec  AORTA Ao Root diam: 3.00 cm MITRAL VALVE                TRICUSPID VALVE MV Area (PHT): 4.99 cm     TR Peak grad:   24.0 mmHg MV Decel Time: 152 msec     TR Vmax:        245.00 cm/s MV E velocity: 126.00 cm/s                             SHUNTS  Systemic Diam: 2.00 cm Marcina Millard MD Electronically signed by Marcina Millard MD Signature Date/Time: 03/07/2023/1:09:22 PM    Final    CT HEAD WO CONTRAST ( )  Result Date: 03/07/2023 CLINICAL DATA:  Neuro deficit, acute, stroke suspected. Altered mental status. Memory loss. EXAM: CT HEAD WITHOUT CONTRAST TECHNIQUE: Contiguous axial images were obtained from the base of the skull through the vertex without intravenous contrast. RADIATION DOSE REDUCTION: This exam was performed according to the departmental dose-optimization program which includes automated exposure control, adjustment of the mA and/or kV according to patient size and/or use of iterative reconstruction technique. COMPARISON:  Head CT and MRI 03/05/2023 FINDINGS: Brain: There is no evidence of an acute cortically based infarct, intracranial hemorrhage, midline shift, or extra-axial fluid collection. The ventricles are normal in size. Mild-to-moderate vasogenic edema in the right frontal lobe, left occipital lobe, and right cerebellar hemisphere is unchanged and without significant associated mass effect. Mild to moderate hypodensities elsewhere in the cerebral white  matter bilaterally are nonspecific but compatible with chronic small vessel ischemic disease. 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 Vascular: Calcified atherosclerosis at the skull base. No hyperdense vessel. Skull: No acute fracture or suspicious osseous lesion. Sinuses/Orbits: Minimal mucosal thickening in the paranasal sinuses. Clear mastoid air cells. Unremarkable orbits. Other: None. IMPRESSION: 1. Unchanged edema associated with known lesions in the right frontal lobe, left occipital lobe, and right cerebellum. No significant mass effect. 2. No evidence of a new intracranial abnormality. Electronically Signed   By: Sebastian Ache M.D.   On: 03/07/2023 11:23   DG Chest Port 1 View  Result Date: 03/07/2023 CLINICAL DATA:  Pleural effusion. EXAM: PORTABLE CHEST 1 VIEW COMPARISON:  Chest radiograph and CT 03/05/2023 FINDINGS: The cardiac silhouette remains enlarged. Aortic atherosclerosis is noted. Veiling opacities in the lower lungs are consistent with persistent small to moderate pleural effusions as shown on CT. Bilateral interstitial and patchy airspace opacities have slightly worsened. No pneumothorax is identified. IMPRESSION: 1. Slight worsening of bilateral lung opacities which may reflect edema or pneumonia. 2. Persistent bilateral pleural effusions. Electronically Signed   By: Sebastian Ache M.D.   On: 03/07/2023 09:50   US Venous Img Lower Bilateral (DVT)  Result Date: 03/06/2023 CLINICAL DATA:  Lower extremity edema EXAM: BILATERAL LOWER EXTREMITY VENOUS DOPPLER ULTRASOUND TECHNIQUE: Gray-scale sonography with graded compression, as well as color Doppler and duplex ultrasound were performed to evaluate the lower extremity deep venous systems from the level of the common femoral vein and including the common femoral, femoral, profunda femoral, popliteal and calf veins including the posterior tibial, peroneal and gastrocnemius veins when visible. The superficial great saphenous vein  was also interrogated. Spectral Doppler was utilized to evaluate flow at rest and with distal augmentation maneuvers in the common femoral, femoral and popliteal veins. COMPARISON:  None Available. FINDINGS: RIGHT LOWER EXTREMITY Common Femoral Vein: No evidence of thrombus. Normal compressibility, respiratory phasicity and response to augmentation. Saphenofemoral Junction: No evidence of thrombus. Normal compressibility and flow on color Doppler imaging. Profunda Femoral Vein: No evidence of thrombus. Normal compressibility and flow on color Doppler imaging. Femoral Vein: No evidence of thrombus. Normal compressibility, respiratory phasicity and response to augmentation. Popliteal Vein: No evidence of thrombus. Normal compressibility, respiratory phasicity and response to augmentation. Calf Veins: No evidence of thrombus. Normal compressibility and flow on color Doppler imaging. Superficial Great Saphenous Vein: No evidence of thrombus. Normal compressibility. Venous Reflux:  None. Other Findings:  None. LEFT LOWER EXTREMITY Common Femoral Vein:  No evidence of thrombus. Normal compressibility, respiratory phasicity and response to augmentation. Saphenofemoral Junction: No evidence of thrombus. Normal compressibility and flow on color Doppler imaging. Profunda Femoral Vein: No evidence of thrombus. Normal compressibility and flow on color Doppler imaging. Femoral Vein: No evidence of thrombus. Normal compressibility, respiratory phasicity and response to augmentation. Popliteal Vein: No evidence of thrombus. Normal compressibility, respiratory phasicity and response to augmentation. Calf Veins: No evidence of thrombus. Normal compressibility and flow on color Doppler imaging. Superficial Great Saphenous Vein: No evidence of thrombus. Normal compressibility. Venous Reflux:  None. Other Findings:  None. IMPRESSION: No evidence of deep venous thrombosis in either lower extremity. Electronically Signed   By: Alcide Clever M.D.   On: 03/06/2023 19:43   ECHOCARDIOGRAM COMPLETE  Result Date: 03/06/2023    ECHOCARDIOGRAM REPORT   Patient Name:  Cody [name]    Date of      03/06/2023                           Exam: Medical Rec #: [id number]  Height:      60.0 in Accession #:   [id number] Weight:      99.2 lb Date of Birth: Jul 14, 1946 BSA:         1.386 m Patient Age:   76 years   BP:          Not listed in chart/Not listed in chart                                        mmHg Patient        M          HR:          Not listed in chart bpm. Gender: Exam Location: [facility] Procedure: 2D Echo, Cardiac Doppler and Color Doppler Indications:     Tamponade I31.4  History:         Patient has prior history of Echocardiogram examinations, most                  recent 03/05/2023. CHF; Risk Factors:Hypertension. Tobacco abuse.  Sonographer:     Cristela Blue Referring Phys:  [id number] [facility] MICHELLE TANG Diagnosing Phys: Marcina Millard MD IMPRESSIONS  1. Left ventricular ejection fraction, by estimation, is 60 to 65%. The left ventricle has normal function. The left ventricle has no regional wall motion abnormalities. Left ventricular diastolic parameters are indeterminate.  2. Right ventricular systolic function is normal. The right ventricular size is normal.  3. Successful pericardiocentesis with removal of 1100 cc serosanguinous pericardial fluid.. Findings are consistent with cardiac tamponade.  4. The mitral valve is normal in structure. No evidence of mitral valve regurgitation. No evidence of mitral stenosis.  5. The aortic valve is normal in structure. Aortic valve regurgitation is not visualized. No aortic stenosis is present.  6. The inferior vena cava is normal in size with greater than 50% respiratory variability, suggesting right atrial pressure of 3 mmHg. FINDINGS  Left Ventricle: Left ventricular ejection fraction, by estimation, is 60 to 65%. The left ventricle has normal function. The left ventricle has no regional wall motion  abnormalities. The left ventricular internal cavity size was normal in size. There is  no left ventricular hypertrophy. Left ventricular diastolic parameters are indeterminate. Right Ventricle: The right ventricular size is normal. No increase in right ventricular  wall thickness. Right ventricular systolic function is normal. Left Atrium: Left atrial size was normal in size. Right Atrium: Right atrial size was normal in size. Pericardium: Successful pericardiocentesis with removal of 1100 cc serosanguinous pericardial fluid. There is no evidence of pericardial effusion. The pericardial effusion is circumferential. There is diastolic collapse of the right ventricular free wall. There is evidence of cardiac tamponade. Mitral Valve: The mitral valve is normal in structure. No evidence of mitral valve regurgitation. No evidence of mitral valve stenosis. Tricuspid Valve: The tricuspid valve is normal in structure. Tricuspid valve regurgitation is not demonstrated. No evidence of tricuspid stenosis. Aortic Valve: The aortic valve is normal in structure. Aortic valve regurgitation is not visualized. No aortic stenosis is present. Pulmonic Valve: The pulmonic valve was normal in structure. Pulmonic valve regurgitation is not visualized. No evidence of pulmonic stenosis. Aorta: The aortic root is normal in size and structure. Venous: The inferior vena cava is normal in size with greater than 50% respiratory variability, suggesting right atrial pressure of 3 mmHg. IAS/Shunts: No atrial level shunt detected by color flow Doppler.  LEFT VENTRICLE PLAX 2D LVIDd:         3.85 cm LVIDs:         3.10 cm LV PW:         0.75 cm LV IVS:        1.10 cm  RIGHT VENTRICLE RV Basal diam:  3.40 cm RV Mid diam:    2.50 cm LEFT ATRIUM         Index       RIGHT ATRIUM           Index LA diam:    4.40 cm 3.18 cm/m  RA Area:     12.50 cm                                 RA Volume:   27.90 ml  20.14 ml/m Marcina Millard MD Electronically  signed by Marcina Millard MD Signature Date/Time: 03/06/2023/5:12:27 PM    Final    CARDIAC CATHETERIZATION  Result Date: 03/06/2023 Successful pericardiocentesis with placement of pigtail catheter   ECHOCARDIOGRAM COMPLETE  Result Date: 03/06/2023    ECHOCARDIOGRAM REPORT   Patient Name:   Cody [name]    Date of Exam: 03/05/2023 Medical Rec #:  [id number]  Height:       57.0 in Accession #:    [id number] Weight:       100.0 lb Date of Birth:  11/29/1945 BSA:          1.340 m Patient Age:    76 years   BP:           123/76 mmHg Patient Gender: M          HR:           74 bpm. Exam Location:  [facility] Procedure: 2D Echo, Cardiac Doppler and Color Doppler Indications:     I31.3 Pericardial Effusion  History:         Patient has prior history of Echocardiogram examinations, most                  recent 07/27/2018. CHF; Risk Factors:Hypertension, Current                  Smoker and Alcohol Abuse.  Sonographer:     Daphine Deutscher RDCS Referring Phys:  [id number] Verdene Lennert Diagnosing Phys: Cristal Deer  End MD IMPRESSIONS  1. Left ventricular ejection fraction, by estimation, is 55 to 60%. The left ventricle has normal function. The left ventricle has no regional wall motion abnormalities. Left ventricular diastolic parameters are consistent with Grade I diastolic dysfunction (impaired relaxation).  2. Right ventricular systolic function is mildly reduced. The right ventricular size is normal. There is normal pulmonary artery systolic pressure.  3. Large pericardial effusion. The pericardial effusion is circumferential. Findings are consistent with cardiac tamponade.  4. The mitral valve is normal in structure. Mild mitral valve regurgitation.  5. Tricuspid valve regurgitation is mild to moderate.  6. The aortic valve is tricuspid. There is mild calcification of the aortic valve. There is mild thickening of the aortic valve. Aortic valve regurgitation is mild to moderate.  7. The inferior vena cava is dilated in size  with <50% respiratory variability, suggesting right atrial pressure of 15 mmHg. Conclusion(s)/Recommendation(s): Findings were conveyed to Dr. Gillis Santa at 8:02 AM on 03/06/2023 by Dr. Cristal Deer End. FINDINGS  Left Ventricle: Left ventricular ejection fraction, by estimation, is 55 to 60%. The left ventricle has normal function. The left ventricle has no regional wall motion abnormalities. The left ventricular internal cavity size was normal in size. There is  no left ventricular hypertrophy. Left ventricular diastolic parameters are consistent with Grade I diastolic dysfunction (impaired relaxation). Right Ventricle: The right ventricular size is normal. No increase in right ventricular wall thickness. Right ventricular systolic function is mildly reduced. There is normal pulmonary artery systolic pressure. The tricuspid regurgitant velocity is 1.71 m/s, and with an assumed right atrial pressure of 15 mmHg, the estimated right ventricular systolic pressure is 26.7 mmHg. Left Atrium: Left atrial size was normal in size. Right Atrium: Right atrial size was normal in size. Pericardium: A large pericardial effusion is present. The pericardial effusion is circumferential. The pericardial effusion appears to contain fibrous material. There is diastolic collapse of the right ventricular free wall and excessive respiratory variation in the mitral valve spectral Doppler velocities. There is evidence of cardiac tamponade. Mitral Valve: The mitral valve is normal in structure. There is mild thickening of the mitral valve leaflet(s). Mild mitral valve regurgitation. Tricuspid Valve: The tricuspid valve is normal in structure. Tricuspid valve regurgitation is mild to moderate. Aortic Valve: The aortic valve is tricuspid. There is mild calcification of the aortic valve. There is mild thickening of the aortic valve. Aortic valve regurgitation is mild to moderate. Aortic regurgitation PHT measures 507 msec. Aortic valve mean  gradient measures 2.5 mmHg. Aortic valve peak gradient measures 4.3 mmHg. Aortic valve area, by VTI measures 2.84 cm. Pulmonic Valve: The pulmonic valve was normal in structure. Pulmonic valve regurgitation is trivial. No evidence of pulmonic stenosis. Aorta: The aortic root and ascending aorta are structurally normal, with no evidence of dilitation. Pulmonary Artery: The pulmonary artery is of normal size. Venous: The inferior vena cava is dilated in size with less than 50% respiratory variability, suggesting right atrial pressure of 15 mmHg. IAS/Shunts: The interatrial septum was not well visualized.  LEFT VENTRICLE PLAX 2D LVIDd:         3.50 cm   Diastology LVIDs:         2.50 cm   LV e' medial:    4.46 cm/s LV PW:         0.90 cm   LV E/e' medial:  9.7 LV IVS:        0.80 cm   LV e' lateral:   6.31 cm/s  LVOT diam:     2.00 cm   LV E/e' lateral: 6.9 LV SV:         46 LV SV Index:   34 LVOT Area:     3.14 cm  RIGHT VENTRICLE RV Basal diam:  3.10 cm RV S prime:     7.29 cm/s TAPSE (M-mode): 1.3 cm LEFT ATRIUM             Index        RIGHT ATRIUM           Index LA diam:        3.10 cm 2.31 cm/m   RA Area:     10.70 cm LA Vol (A2C):   37.7 ml 28.14 ml/m  RA Volume:   27.40 ml  20.45 ml/m LA Vol (A4C):   25.3 ml 18.88 ml/m LA Biplane Vol: 33.9 ml 25.30 ml/m  AORTIC VALVE AV Area (Vmax):    2.86 cm AV Area (Vmean):   2.60 cm AV Area (VTI):     2.84 cm AV Vmax:           103.69 cm/s AV Vmean:          74.930 cm/s AV VTI:            0.161 m AV Peak Grad:      4.3 mmHg AV Mean Grad:      2.5 mmHg LVOT Vmax:         94.33 cm/s LVOT Vmean:        62.000 cm/s LVOT VTI:          0.145 m LVOT/AV VTI ratio: 0.90 AI PHT:            507 msec  AORTA Ao Root diam: 3.20 cm Ao Asc diam:  3.50 cm MITRAL VALVE               TRICUSPID VALVE MV Area (PHT): 4.28 cm    TR Peak grad:   11.7 mmHg MV Decel Time: 177 msec    TR Vmax:        171.00 cm/s MV E velocity: 43.23 cm/s MV A velocity: 63.73 cm/s  SHUNTS MV E/A ratio:   0.68        Systemic VTI:  0.15 m                            Systemic Diam: 2.00 cm Yvonne Kendall MD Electronically signed by Yvonne Kendall MD Signature Date/Time: 03/06/2023/8:13:55 AM    Final    MR Brain W and Wo Contrast  Result Date: 03/05/2023 CLINICAL DATA:  Mental status change, unknown cause EXAM: MRI HEAD WITHOUT AND WITH CONTRAST TECHNIQUE: Multiplanar, multiecho pulse sequences of the brain and surrounding structures were obtained without and with intravenous contrast. CONTRAST:  5mL GADAVIST GADOBUTROL 1 MMOL/ML IV SOLN COMPARISON:  Same day CT head FINDINGS: Brain: Negative for an acute infarct. No hydrocephalus. No extra-axial fluid collection. Small microhemorrhage in the right lentiform nucleus. Chronic left cerebellar infarct. There is sequela mild-to-moderate chronic microvascular ischemic change. There are multiple lesions, including- -1.0 x 0.7 cm contrast-enhancing lesion in the right frontal lobe with surrounding vasogenic edema (series 18, image 108) - 0.8 x 0.7 cm contrast-enhancing lesions in in the left occipital lobe with surrounding vasogenic edema (series 18, image 69). -1.2 x 1.1 cm contrast-enhancing lesion in the superior right cerebellum mild surrounding vasogenic edema (series 18, image 50) -0.6 x  0.4 cm dural-based contrast-enhancing lesion along the left frontal convexity (series 93, image 176). Vascular: Normal flow voids. Skull and upper cervical spine: Normal marrow signal. Sinuses/Orbits: No middle ear or mastoid effusion. Mild mucosal thickening bilateral ethmoid air cells. Orbits are unremarkable. Other: None. IMPRESSION: 1. Multiple contrast-enhancing lesions in the right frontal lobe, left occipital lobe, and superior right cerebellum with surrounding vasogenic edema. 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 2. Small dural-based contrast-enhancing lesion along the left frontal convexity could represent a small meningimoa, but metastatic disease is  not excluded. Electronically Signed   By: Lorenza Cambridge M.D.   On: 03/05/2023 16:48   CT CHEST ABDOMEN PELVIS W CONTRAST  Result Date: 03/05/2023 CLINICAL DATA:  Brain mass. Assess for occult malignancy. * Tracking Code: BO * EXAM: CT CHEST, ABDOMEN, AND PELVIS WITH CONTRAST TECHNIQUE: Multidetector CT imaging of the chest, abdomen and pelvis was performed following the standard protocol during bolus administration of intravenous contrast. RADIATION DOSE REDUCTION: This exam was performed according to the departmental dose-optimization program which includes automated exposure control, adjustment of the mA and/or kV according to patient size and/or use of iterative reconstruction technique. CONTRAST:  75mL OMNIPAQUE IOHEXOL 300 MG/ML  SOLN COMPARISON:  Chest CT without contrast 04/28/2015. X-ray chest earlier 03/05/2023 FINDINGS: CT CHEST FINDINGS Cardiovascular: Very large pericardial effusion. The heart itself is nonenlarged. Significant coronary artery calcifications. The thoracic aorta has a normal course and caliber with mild atherosclerotic calcified plaque. Mediastinum/Nodes: Normal caliber thoracic esophagus. Preserved thyroid gland. No specific abnormal lymph node enlargement seen including in the axillary regions, hilum. Prominent right paratracheal node on series 2, image 18 measures 18 by 7 mm. This could be 2 adjacent nodes. Subcarinal node series 2, image 29 measures 2.7 by 1.1 cm, mildly enlarged. Lungs/Pleura: Moderate right and small left effusion. There are some loculated components on the left with fluid tracking along the interlobar fissure. Adjacent parenchymal opacities. Atelectasis versus infiltrate. There is a opacity specifically in the lingula as well. Scattered ground-glass elsewhere in the visualized lungs. No pneumothorax. No obvious mass. Significant breathing motion throughout the examination. A subtle mass lesion could be obscured by the lung opacity in the effusions. Follow up  study could be performed after improvement of the effusions. Musculoskeletal: Mild degenerative changes along the spine. CT ABDOMEN PELVIS FINDINGS Hepatobiliary: Contrast exam in the abdomen is more in the late arterial phase and portal venous phase. Patent portal vein. Gallbladder is present. Small cysts seen in segment 4 of the liver. Pancreas: Unremarkable. No pancreatic ductal dilatation or surrounding inflammatory changes. Spleen: Normal in size without focal abnormality. Adrenals/Urinary Tract: Slight nonspecific thickening of the adrenal glands but unchanged from previous. No enhancing renal mass or collecting system dilatation. There are some small low-attenuation lesions in the left kidney which are too small to completely characterize but likely benign cysts, Bosniak 2 lesions. No specific imaging follow-up. The ureters have normal course and caliber extending down to the bladder. Diffuse wall thickening of the urinary bladder. Stomach/Bowel: On this non oral contrast exam, large bowel has a normal caliber. Redundant course of the sigmoid colon. There are some areas of mild wall thickening along loops of bowel, nonspecific. The stomach is mildly distended with fluid. Mild fold thickening suggested small bowel is nondilated. Vascular/Lymphatic: Normal caliber aorta and IVC with moderate calcified plaque along the aorta and iliac vessels. There are several enlarged nodes identified in the retroperitoneum and upper abdomen. Example aortocaval node posteriorly on series 2, image 64 measures  17 by 13 mm. No next of the celiac series 2, image 59 measures 16 by 14 mm. Left para-node anteriorly on series 2, image 68 measures 17 by 8 mm. A few small retrocrural nodes are also identified. Reproductive: Enlarged heterogeneous prostate. Please correlate with patient's PSA. Other: Anasarca.  Mesenteric haziness.  Trace ascites.  No free air Musculoskeletal: Mild degenerative changes along the spine. IMPRESSION: Large  pericardial effusion. Please correlate with and new symptoms including for tamponade. Moderate right and small left effusion with some loculated components on the left. Adjacent parenchymal opacities. Atelectasis versus infiltrate. Recommend follow up after clearance of the effusions. Few borderline enlarged lymph nodes identified including mediastinum, upper retroperitoneum. Enlarged prostate with mass effect along the bladder with bladder wall thickening. Please correlate with patient's PSA. No bowel obstruction or free air. There is scattered mesenteric haziness with trace ascites. There are some small areas of wall thickening along the colon but this could simply relate to the other signs of edema. Electronically Signed   By: Karen Kays M.D.   On: 03/05/2023 15:03     Medications:    sodium chloride     sodium chloride 1 mL/kg/hr (03/07/23 1150)   cefTRIAXone (ROCEPHIN)  IV Stopped (03/06/23 1531)   doxycycline (VIBRAMYCIN) IV     levETIRAcetam Stopped (03/07/23 0603)   sodium chloride (hypertonic) Stopped (03/06/23 1904)    Chlorhexidine Gluconate Cloth  6 each Topical Daily   colchicine  0.6 mg Oral Daily   dexamethasone  4 mg Oral Daily   folic acid  1 mg Oral Daily   melatonin  5 mg Oral QHS   multivitamin with minerals  1 tablet Oral Daily   sodium bicarbonate  650 mg Oral TID   sodium chloride flush  3 mL Intravenous Q12H   sodium chloride flush  3 mL Intravenous Q12H   tamsulosin  0.4 mg Oral Daily   thiamine  100 mg Oral Daily   Or   thiamine  100 mg Intravenous Daily   sodium chloride, acetaminophen **OR** acetaminophen, ipratropium-albuterol, ondansetron **OR** ondansetron (ZOFRAN) IV, sodium chloride flush  Assessment/ Plan:  [name] is a 77 y.o.  male with past medical conditions including hypertension, alcohol abuse, BPH, and heart failure with preserved EF, who was admitted to [facility] on 03/05/2023 for  Acute hyponatremia [E87.1] Acute encephalopathy  [G93.40] Pneumonia of left lower lobe due to infectious organism [J18.9] Vasogenic edema (HCC) [G93.6]   Hyponatremia likely secondary to increased fluid intake and suspected malignancy seen on CT chest, abdomen, and pelvis.  Patient does have tobacco use history.  Sodium corrected to 130 yesterday evening.  3% hypertonic saline stopped at that time.  Sodium remains 129.  If further decrease, can consider tolvaptan if no liver concerns.    LOS: 2 Wendee Beavers 5/9/20241:46 PM

## 2023-03-08 ENCOUNTER — Other Ambulatory Visit: Payer: Self-pay | Admitting: Anatomic Pathology & Clinical Pathology

## 2023-03-08 ENCOUNTER — Inpatient Hospital Stay: Payer: Medicare Other

## 2023-03-08 DIAGNOSIS — G936 Cerebral edema: Secondary | ICD-10-CM | POA: Diagnosis not present

## 2023-03-08 DIAGNOSIS — I509 Heart failure, unspecified: Secondary | ICD-10-CM | POA: Diagnosis not present

## 2023-03-08 DIAGNOSIS — C7931 Secondary malignant neoplasm of brain: Secondary | ICD-10-CM | POA: Diagnosis not present

## 2023-03-08 DIAGNOSIS — G9340 Encephalopathy, unspecified: Secondary | ICD-10-CM | POA: Diagnosis not present

## 2023-03-08 DIAGNOSIS — G9389 Other specified disorders of brain: Secondary | ICD-10-CM | POA: Diagnosis not present

## 2023-03-08 DIAGNOSIS — Z7189 Other specified counseling: Secondary | ICD-10-CM

## 2023-03-08 DIAGNOSIS — C3490 Malignant neoplasm of unspecified part of unspecified bronchus or lung: Secondary | ICD-10-CM

## 2023-03-08 LAB — BASIC METABOLIC PANEL
Anion gap: 4 — ABNORMAL LOW (ref 5–15)
BUN: 28 mg/dL — ABNORMAL HIGH (ref 8–23)
CO2: 23 mmol/L (ref 22–32)
Calcium: 7.7 mg/dL — ABNORMAL LOW (ref 8.9–10.3)
Chloride: 107 mmol/L (ref 98–111)
Creatinine, Ser: 0.82 mg/dL (ref 0.61–1.24)
GFR, Estimated: >60 mL/min (ref >60)
Glucose, Bld: 73 mg/dL (ref 70–99)
Potassium: 3.7 mmol/L (ref 3.5–5.1)
Sodium: 134 mmol/L — ABNORMAL LOW (ref 135–145)

## 2023-03-08 LAB — CBC
HCT: 30.4 % — ABNORMAL LOW (ref 39.0–52.0)
Hemoglobin: 9.7 g/dL — ABNORMAL LOW (ref 13.0–17.0)
MCH: 31 pg (ref 26.0–34.0)
MCHC: 31.9 g/dL (ref 30.0–36.0)
MCV: 97.1 fL (ref 80.0–100.0)
Platelets: 260 10*3/uL (ref 150–400)
RBC: 3.13 MIL/uL — ABNORMAL LOW (ref 4.22–5.81)
RDW: 13.7 % (ref 11.5–15.5)
WBC: 12.1 10*3/uL — ABNORMAL HIGH (ref 4.0–10.5)
nRBC: 0 % (ref 0.0–0.2)

## 2023-03-08 LAB — HEPATIC FUNCTION PANEL
ALT: 122 U/L — ABNORMAL HIGH (ref 0–44)
AST: 90 U/L — ABNORMAL HIGH (ref 15–41)
Albumin: 2.2 g/dL — ABNORMAL LOW (ref 3.5–5.0)
Alkaline Phosphatase: 52 U/L (ref 38–126)
Bilirubin, Direct: 0.1 mg/dL (ref 0.0–0.2)
Indirect Bilirubin: 0.6 mg/dL (ref 0.3–0.9)
Total Bilirubin: 0.7 mg/dL (ref 0.3–1.2)
Total Protein: 5.2 g/dL — ABNORMAL LOW (ref 6.5–8.1)

## 2023-03-08 LAB — LD, BODY FLUID (OTHER): LD, Body Fluid: 654 IU/L

## 2023-03-08 LAB — COMP PANEL: LEUKEMIA/LYMPHOMA

## 2023-03-08 LAB — GLUCOSE, BODY FLUID OTHER: Glucose, Body Fluid Other: 23 mg/dL

## 2023-03-08 LAB — PHOSPHORUS: Phosphorus: 3.1 mg/dL (ref 2.5–4.6)

## 2023-03-08 LAB — PROTEIN, BODY FLUID (OTHER): Total Protein, Body Fluid Other: 5.6 g/dL

## 2023-03-08 LAB — MAGNESIUM: Magnesium: 2.3 mg/dL (ref 1.7–2.4)

## 2023-03-08 LAB — CYTOLOGY - NON PAP

## 2023-03-08 LAB — CULTURE, BLOOD (ROUTINE X 2)

## 2023-03-08 LAB — BODY FLUID CULTURE W GRAM STAIN

## 2023-03-08 MED ORDER — ENSURE ENLIVE PO LIQD
237.0000 mL | Freq: Three times a day (TID) | ORAL | Status: DC
  Administered 2023-03-08 – 2023-03-12 (×9): 237 mL via ORAL

## 2023-03-08 MED ORDER — AMIODARONE HCL 200 MG PO TABS
200.0000 mg | ORAL_TABLET | Freq: Every day | ORAL | Status: DC
  Administered 2023-03-08 – 2023-03-12 (×5): 200 mg via ORAL
  Filled 2023-03-08 (×6): qty 1

## 2023-03-08 MED ORDER — DEXAMETHASONE 0.5 MG PO TABS
2.0000 mg | ORAL_TABLET | Freq: Two times a day (BID) | ORAL | Status: DC
  Administered 2023-03-09 – 2023-03-12 (×7): 2 mg via ORAL
  Filled 2023-03-08 (×7): qty 4

## 2023-03-08 NOTE — Plan of Care (Signed)
Continuing with plan of care. 

## 2023-03-08 NOTE — Progress Notes (Signed)
[facility]  ROUNDING NOTE   Subjective:  Patient awake and alert this a.m. Family members at bedside. Serum sodium now up to 134.  Objective:  Vital signs in last 24 hours:  Temp:  [97.6 F (36.4 C)-97.8 F (36.6 C)] 97.8 F (36.6 C) (05/10 0800) Pulse Rate:  [48-103] 78 (05/10 0900) Resp:  [10-18] 18 (05/10 0900) BP: (96-129)/(51-88) 126/61 (05/10 0800) SpO2:  [93 %-100 %] 100 % (05/10 0900)  Weight change:  Filed Weights   03/06/23 0000 03/06/23 0500 03/07/23 0500  Weight: 45 kg 45 kg 48.9 kg    Intake/Output: I/O last 3 completed shifts: In: 2088.7 [I.V.:1144.8; IV Piggyback:943.9] Out: 1550 [Urine:1550]   Intake/Output this shift:  Total I/O In: 121.8 [I.V.:90; IV Piggyback:31.8] Out: -   Physical Exam: General: NAD  Head: Normocephalic, atraumatic. Moist oral mucosal membranes  Eyes: Anicteric  Lungs:  Clear to auscultation, normal effort  Heart: Regular rate and rhythm  Abdomen:  Soft, nontender,   Extremities: No peripheral edema.  Neurologic: Nonfocal, moving all four extremities  Skin: No lesions  Access: None    Basic Metabolic Panel: Recent Labs  Lab 03/05/23 1112 03/05/23 1656 03/06/23 0258 03/06/23 0849 03/06/23 1455 03/07/23 0308 03/07/23 0719 03/07/23 1221 03/07/23 1601 03/08/23 0539  NA 117*   < > 124* 125*   < > 130* 129* 131* 131* 134*  K 4.9  --  5.0  --   --  4.5  --   --   --  3.7  CL 87*  --  95*  --   --  102  --   --   --  107  CO2 18*  --  17*  --   --  18*  --   --   --  23  GLUCOSE 154*  --  129*  --   --  100*  --   --   --  73  BUN 39*  --  42*  --   --  43*  --   --   --  28*  CREATININE 1.18  --  0.99  --   --  0.89  --   --   --  0.82  CALCIUM 8.5*  --  8.5*  --   --  8.0*  --   --   --  7.7*  MG  --   --   --  2.5*  --  2.5*  --   --   --  2.3  PHOS  --   --   --  4.3  --  4.5  --   --   --  3.1   < > = values in this interval not displayed.     Liver Function Tests: Recent Labs  Lab  03/05/23 1112 03/06/23 0258 03/07/23 0308 03/07/23 1628 03/08/23 0539  AST 333* 254* 119*  --  90*  ALT 214* 217* 142*  --  122*  ALKPHOS 84 82 59  --  52  BILITOT 1.0 0.9 0.7  --  0.7  PROT 7.5 6.7 5.5* 5.8* 5.2*  ALBUMIN 3.0* 2.7* 2.3* 2.4* 2.2*    No results for input(s): "LIPASE", "AMYLASE" in the last 168 hours. No results for input(s): "AMMONIA" in the last 168 hours.  CBC: Recent Labs  Lab 03/05/23 1112 03/06/23 0258 03/07/23 0308 03/07/23 1628 03/08/23 0539  WBC 13.2* 11.3* 14.2*  --  12.1*  NEUTROABS  --  9.2*  --   --   --  HGB 10.9* 10.9* 10.0*  --  9.7*  HCT 32.8* 32.2* 31.0* 32.6* 30.4*  MCV 93.4 91.5 96.9  --  97.1  PLT 357 309 274  --  260     Cardiac Enzymes: No results for input(s): "CKTOTAL", "CKMB", "CKMBINDEX", "TROPONINI" in the last 168 hours.  BNP: Invalid input(s): "POCBNP"  CBG: Recent Labs  Lab 03/05/23 2257  GLUCAP 119*     Microbiology: Results for orders placed or performed during the hospital encounter of 03/05/23  Blood culture (routine x 2)     Status: None (Preliminary result)   Collection Time: 03/05/23  2:10 PM   Specimen: BLOOD  Result Value Ref Range Status   Specimen Description BLOOD BLOOD RIGHT ARM  Final   Special Requests   Final    BOTTLES DRAWN AEROBIC AND ANAEROBIC Blood Culture adequate volume   Culture   Final    NO GROWTH 3 DAYS Performed at [facility], [address] [place], [place] [address]    Report Status PENDING  Incomplete  Blood culture (routine x 2)     Status: None (Preliminary result)   Collection Time: 03/05/23  2:10 PM   Specimen: BLOOD  Result Value Ref Range Status   Specimen Description BLOOD BLOOD LEFT ARM  Final   Special Requests   Final    BOTTLES DRAWN AEROBIC AND ANAEROBIC Blood Culture adequate volume   Culture   Final    NO GROWTH 3 DAYS Performed at [facility], [address], [place], [place] [address]    Report Status PENDING  Incomplete  Body  fluid culture w Gram Stain     Status: None (Preliminary result)   Collection Time: 03/06/23  9:34 AM   Specimen: PATH Cytology Misc. fluid; Body Fluid  Result Value Ref Range Status   Specimen Description   Final    PERICARDIAL Performed at [facility], [address] [place], [place] [address]    Special Requests   Final    PERICARDIAL Performed at [facility], [address] [place], [place] [address]    Gram Stain NO WBC SEEN NO ORGANISMS SEEN   Final   Culture   Final    NO GROWTH < 24 HOURS Performed at [facility], [address], [place], [place] [address]    Report Status PENDING  Incomplete  Pleural fluid culture w Gram Stain     Status: None (Preliminary result)   Collection Time: 03/07/23  4:00 PM   Specimen: Pleural Fluid  Result Value Ref Range Status   Specimen Description   Final    PLEURAL Performed at [facility], [address], [place], [place] [address]    Special Requests   Final    NONE Performed at [facility], [address] [place], [place] [address]    Gram Stain   Final    FEW WBC PRESENT,BOTH PMN AND MONONUCLEAR NO ORGANISMS SEEN Performed at [facility], [address], [place], [place] [address]    Culture PENDING  Incomplete   Report Status PENDING  Incomplete    Coagulation Studies: Recent Labs    03/06/23 0258  LABPROT 18.3*  INR 1.5*     Urinalysis: No results for input(s): "COLORURINE", "LABSPEC", "PHURINE", "GLUCOSEU", "HGBUR", "BILIRUBINUR", "KETONESUR", "PROTEINUR", "UROBILINOGEN", "NITRITE", "LEUKOCYTESUR" in the last 72 hours.  Invalid input(s): "APPERANCEUR"    Imaging: DG Chest Port 1 View  Result Date: 03/07/2023 CLINICAL DATA:  Status  post right thoracentesis EXAM: PORTABLE CHEST 1 VIEW COMPARISON:  Chest radiograph dated 03/07/2023 FINDINGS: Lines/tubes: Partially imaged inferior approach catheter projects over the left upper quadrant. Chest: Improved  lung aeration with persistent left-greater-than-right interstitial and patchy opacities. Pleura: Decreased right pleural effusion. Persistent moderate left pleural effusion. No pneumothorax. Heart/mediastinum: Similar enlarged cardiomediastinal silhouette. Bones: No acute osseous abnormality. IMPRESSION: 1. Decreased right pleural effusion status post thoracentesis. No pneumothorax. 2. Persistent moderate left pleural effusion. 3. Improved lung aeration with persistent left-greater-than-right interstitial and patchy opacities. Electronically Signed   By: Agustin Cree M.D.   On: 03/07/2023 16:35   ECHOCARDIOGRAM LIMITED  Result Date: 03/07/2023    ECHOCARDIOGRAM LIMITED REPORT   Patient Name:   Demarqus Rieman    Date of Exam: 03/07/2023 Medical Rec #:  [id number]  Height:       60.0 in Accession #:    [id number] Weight:       107.8 lb Date of Birth:  06/22/1946 BSA:          1.435 m Patient Age:    76 years   BP:           92/60 mmHg Patient Gender: M          HR:           87 bpm. Exam Location:  [facility] Procedure: Limited Echo, Color Doppler and Cardiac Doppler Indications:     Tamponade I31.4  History:         Patient has prior history of Echocardiogram examinations, most                  recent 03/06/2023. CHF; Risk Factors:Hypertension. Tobacco abuse.  Sonographer:     Cristela Blue Referring Phys:  [id number] CARALYN HUDSON Diagnosing Phys: Marcina Millard MD IMPRESSIONS  1. Left ventricular ejection fraction, by estimation, is 60 to 65%. The left ventricle has normal function. The left ventricle has no regional wall motion abnormalities. Indeterminate diastolic filling due to E-A fusion.  2. Right ventricular systolic function is normal. The right ventricular size is normal.  3. Moderate pericardial effusion.  4. The mitral valve is normal in structure. Mild mitral valve regurgitation. No evidence of mitral stenosis.  5. The aortic valve is normal in structure. Aortic valve regurgitation is mild. No aortic stenosis is  present.  6. The inferior vena cava is normal in size with greater than 50% respiratory variability, suggesting right atrial pressure of 3 mmHg. FINDINGS  Left Ventricle: Left ventricular ejection fraction, by estimation, is 60 to 65%. The left ventricle has normal function. The left ventricle has no regional wall motion abnormalities. The left ventricular internal cavity size was normal in size. There is  no left ventricular hypertrophy. Indeterminate diastolic filling due to E-A fusion. Right Ventricle: The right ventricular size is normal. No increase in right ventricular wall thickness. Right ventricular systolic function is normal. Left Atrium: Left atrial size was normal in size. Right Atrium: Right atrial size was normal in size. Pericardium: Pericardial effusion much reduced after pericardiocenetesis. No right atrial or right ventricular diastolic collapse. No evidence for preicardial tamonade. A moderately sized pericardial effusion is present. Mitral Valve: The mitral valve is normal in structure. Mild mitral valve regurgitation. No evidence of mitral valve stenosis. Tricuspid Valve: The tricuspid valve is normal in structure. Tricuspid valve regurgitation is mild . No evidence of tricuspid stenosis. Aortic Valve: The aortic valve is normal in structure. Aortic valve regurgitation is mild. Aortic regurgitation PHT measures 436 msec. No  aortic stenosis is present. Pulmonic Valve: The pulmonic valve was normal in structure. Pulmonic valve regurgitation is not visualized. No evidence of pulmonic stenosis. Aorta: The aortic root is normal in size and structure. Venous: The inferior vena cava is normal in size with greater than 50% respiratory variability, suggesting right atrial pressure of 3 mmHg. IAS/Shunts: No atrial level shunt detected by color flow Doppler. Additional Comments: Spectral Doppler performed. Color Doppler performed.  LEFT VENTRICLE PLAX 2D LVIDd:         4.40 cm LVIDs:         2.80 cm LV  PW:         0.90 cm LV IVS:        0.80 cm LVOT diam:     2.00 cm LVOT Area:     3.14 cm  LEFT ATRIUM         Index LA diam:    2.80 cm 1.95 cm/m  AORTIC VALVE AI PHT:      436 msec  AORTA Ao Root diam: 3.00 cm MITRAL VALVE                TRICUSPID VALVE MV Area (PHT): 4.99 cm     TR Peak grad:   24.0 mmHg MV Decel Time: 152 msec     TR Vmax:        245.00 cm/s MV E velocity: 126.00 cm/s                             SHUNTS                             Systemic Diam: 2.00 cm Marcina Millard MD Electronically signed by Marcina Millard MD Signature Date/Time: 03/07/2023/1:09:22 PM    Final    CT HEAD WO CONTRAST ( )  Result Date: 03/07/2023 CLINICAL DATA:  Neuro deficit, acute, stroke suspected. Altered mental status. Memory loss. EXAM: CT HEAD WITHOUT CONTRAST TECHNIQUE: Contiguous axial images were obtained from the base of the skull through the vertex without intravenous contrast. RADIATION DOSE REDUCTION: This exam was performed according to the departmental dose-optimization program which includes automated exposure control, adjustment of the mA and/or kV according to patient size and/or use of iterative reconstruction technique. COMPARISON:  Head CT and MRI 03/05/2023 FINDINGS: Brain: There is no evidence of an acute cortically based infarct, intracranial hemorrhage, midline shift, or extra-axial fluid collection. The ventricles are normal in size. Mild-to-moderate vasogenic edema in the right frontal lobe, left occipital lobe, and right cerebellar hemisphere is unchanged and without significant associated mass effect. Mild to moderate hypodensities elsewhere in the cerebral white matter bilaterally are nonspecific but compatible with chronic small vessel ischemic disease. 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 Vascular: Calcified atherosclerosis at the skull base. No hyperdense vessel. Skull: No acute fracture or suspicious osseous lesion. Sinuses/Orbits: Minimal mucosal thickening  in the paranasal sinuses. Clear mastoid air cells. Unremarkable orbits. Other: None. IMPRESSION: 1. Unchanged edema associated with known lesions in the right frontal lobe, left occipital lobe, and right cerebellum. No significant mass effect. 2. No evidence of a new intracranial abnormality. Electronically Signed   By: Sebastian Ache M.D.   On: 03/07/2023 11:23   DG Chest Port 1 View  Result Date: 03/07/2023 CLINICAL DATA:  Pleural effusion. EXAM: PORTABLE CHEST 1 VIEW COMPARISON:  Chest radiograph and CT 03/05/2023 FINDINGS: The cardiac silhouette remains  enlarged. Aortic atherosclerosis is noted. Veiling opacities in the lower lungs are consistent with persistent small to moderate pleural effusions as shown on CT. Bilateral interstitial and patchy airspace opacities have slightly worsened. No pneumothorax is identified. IMPRESSION: 1. Slight worsening of bilateral lung opacities which may reflect edema or pneumonia. 2. Persistent bilateral pleural effusions. Electronically Signed   By: Sebastian Ache M.D.   On: 03/07/2023 09:50   US Venous Img Lower Bilateral (DVT)  Result Date: 03/06/2023 CLINICAL DATA:  Lower extremity edema EXAM: BILATERAL LOWER EXTREMITY VENOUS DOPPLER ULTRASOUND TECHNIQUE: Gray-scale sonography with graded compression, as well as color Doppler and duplex ultrasound were performed to evaluate the lower extremity deep venous systems from the level of the common femoral vein and including the common femoral, femoral, profunda femoral, popliteal and calf veins including the posterior tibial, peroneal and gastrocnemius veins when visible. The superficial great saphenous vein was also interrogated. Spectral Doppler was utilized to evaluate flow at rest and with distal augmentation maneuvers in the common femoral, femoral and popliteal veins. COMPARISON:  None Available. FINDINGS: RIGHT LOWER EXTREMITY Common Femoral Vein: No evidence of thrombus. Normal compressibility, respiratory phasicity and  response to augmentation. Saphenofemoral Junction: No evidence of thrombus. Normal compressibility and flow on color Doppler imaging. Profunda Femoral Vein: No evidence of thrombus. Normal compressibility and flow on color Doppler imaging. Femoral Vein: No evidence of thrombus. Normal compressibility, respiratory phasicity and response to augmentation. Popliteal Vein: No evidence of thrombus. Normal compressibility, respiratory phasicity and response to augmentation. Calf Veins: No evidence of thrombus. Normal compressibility and flow on color Doppler imaging. Superficial Great Saphenous Vein: No evidence of thrombus. Normal compressibility. Venous Reflux:  None. Other Findings:  None. LEFT LOWER EXTREMITY Common Femoral Vein: No evidence of thrombus. Normal compressibility, respiratory phasicity and response to augmentation. Saphenofemoral Junction: No evidence of thrombus. Normal compressibility and flow on color Doppler imaging. Profunda Femoral Vein: No evidence of thrombus. Normal compressibility and flow on color Doppler imaging. Femoral Vein: No evidence of thrombus. Normal compressibility, respiratory phasicity and response to augmentation. Popliteal Vein: No evidence of thrombus. Normal compressibility, respiratory phasicity and response to augmentation. Calf Veins: No evidence of thrombus. Normal compressibility and flow on color Doppler imaging. Superficial Great Saphenous Vein: No evidence of thrombus. Normal compressibility. Venous Reflux:  None. Other Findings:  None. IMPRESSION: No evidence of deep venous thrombosis in either lower extremity. Electronically Signed   By: Alcide Clever M.D.   On: 03/06/2023 19:43   ECHOCARDIOGRAM COMPLETE  Result Date: 03/06/2023    ECHOCARDIOGRAM REPORT   Patient Name:  Ksean Bressi    Date of      03/06/2023                           Exam: Medical Rec #: [id number]  Height:      60.0 in Accession #:   [id number] Weight:      99.2 lb Date of Birth: Mar 06, 1946 BSA:          1.386 m Patient Age:   76 years   BP:          Not listed in chart/Not listed in chart                                        mmHg Patient        M  HR:          Not listed in chart bpm. Gender: Exam Location: [facility] Procedure: 2D Echo, Cardiac Doppler and Color Doppler Indications:     Tamponade I31.4  History:         Patient has prior history of Echocardiogram examinations, most                  recent 03/05/2023. CHF; Risk Factors:Hypertension. Tobacco abuse.  Sonographer:     Cristela Blue Referring Phys:  [id number] [facility] MICHELLE TANG Diagnosing Phys: Marcina Millard MD IMPRESSIONS  1. Left ventricular ejection fraction, by estimation, is 60 to 65%. The left ventricle has normal function. The left ventricle has no regional wall motion abnormalities. Left ventricular diastolic parameters are indeterminate.  2. Right ventricular systolic function is normal. The right ventricular size is normal.  3. Successful pericardiocentesis with removal of 1100 cc serosanguinous pericardial fluid.. Findings are consistent with cardiac tamponade.  4. The mitral valve is normal in structure. No evidence of mitral valve regurgitation. No evidence of mitral stenosis.  5. The aortic valve is normal in structure. Aortic valve regurgitation is not visualized. No aortic stenosis is present.  6. The inferior vena cava is normal in size with greater than 50% respiratory variability, suggesting right atrial pressure of 3 mmHg. FINDINGS  Left Ventricle: Left ventricular ejection fraction, by estimation, is 60 to 65%. The left ventricle has normal function. The left ventricle has no regional wall motion abnormalities. The left ventricular internal cavity size was normal in size. There is  no left ventricular hypertrophy. Left ventricular diastolic parameters are indeterminate. Right Ventricle: The right ventricular size is normal. No increase in right ventricular wall thickness. Right ventricular systolic function is normal. Left  Atrium: Left atrial size was normal in size. Right Atrium: Right atrial size was normal in size. Pericardium: Successful pericardiocentesis with removal of 1100 cc serosanguinous pericardial fluid. There is no evidence of pericardial effusion. The pericardial effusion is circumferential. There is diastolic collapse of the right ventricular free wall. There is evidence of cardiac tamponade. Mitral Valve: The mitral valve is normal in structure. No evidence of mitral valve regurgitation. No evidence of mitral valve stenosis. Tricuspid Valve: The tricuspid valve is normal in structure. Tricuspid valve regurgitation is not demonstrated. No evidence of tricuspid stenosis. Aortic Valve: The aortic valve is normal in structure. Aortic valve regurgitation is not visualized. No aortic stenosis is present. Pulmonic Valve: The pulmonic valve was normal in structure. Pulmonic valve regurgitation is not visualized. No evidence of pulmonic stenosis. Aorta: The aortic root is normal in size and structure. Venous: The inferior vena cava is normal in size with greater than 50% respiratory variability, suggesting right atrial pressure of 3 mmHg. IAS/Shunts: No atrial level shunt detected by color flow Doppler.  LEFT VENTRICLE PLAX 2D LVIDd:         3.85 cm LVIDs:         3.10 cm LV PW:         0.75 cm LV IVS:        1.10 cm  RIGHT VENTRICLE RV Basal diam:  3.40 cm RV Mid diam:    2.50 cm LEFT ATRIUM         Index       RIGHT ATRIUM           Index LA diam:    4.40 cm 3.18 cm/m  RA Area:     12.50 cm  RA Volume:   27.90 ml  20.14 ml/m Marcina Millard MD Electronically signed by Marcina Millard MD Signature Date/Time: 03/06/2023/5:12:27 PM    Final      Medications:    sodium chloride     sodium chloride 1 mL/kg/hr (03/08/23 0900)   cefTRIAXone (ROCEPHIN)  IV Stopped (03/07/23 1748)   doxycycline (VIBRAMYCIN) IV 100 mg (03/08/23 0919)   levETIRAcetam Stopped (03/08/23 0704)     Chlorhexidine Gluconate Cloth  6 each Topical Daily   colchicine  0.6 mg Oral Daily   dexamethasone  4 mg Oral Daily   feeding supplement  237 mL Oral TID BM   folic acid  1 mg Oral Daily   multivitamin with minerals  1 tablet Oral Daily   sodium chloride flush  3 mL Intravenous Q12H   sodium chloride flush  3 mL Intravenous Q12H   tamsulosin  0.4 mg Oral Daily   thiamine  100 mg Oral Daily   Or   thiamine  100 mg Intravenous Daily   sodium chloride, acetaminophen **OR** acetaminophen, ipratropium-albuterol, ondansetron **OR** ondansetron (ZOFRAN) IV, sodium chloride flush  Assessment/ Plan:  [name] is a 77 y.o.  male with past medical conditions including hypertension, alcohol abuse, BPH, and heart failure with preserved EF, who was admitted to [facility] on 03/05/2023 for  Acute hyponatremia [E87.1] Acute encephalopathy [G93.40] Pneumonia of left lower lobe due to infectious organism [J18.9] Vasogenic edema (HCC) [G93.6]   Hyponatremia likely secondary to increased fluid intake and suspected malignancy seen on CT chest, abdomen, and pelvis.  Patient does have tobacco use history.   Serum sodium now almost completely corrected at 134.  Would recommend 1.2 L fluid restriction.  If sodium drops in the future could consider adding salt tablets as well as low-dose furosemide.  No further renal input.  Thanks for allowing [place] to participate.  We will sign off.    LOS: 3 Tyyne Cliett 5/10/202410:36 AM

## 2023-03-08 NOTE — Procedures (Signed)
Routine EEG Report  Cody [name] is a 77 y.o. male with a history of altered mental status who is undergoing an EEG to evaluate for seizures.  Report: This EEG was acquired with electrodes placed according to the International 10-20 electrode system (including Fp1, Fp2, F3, F4, C3, C4, P3, P4, O1, O2, T3, T4, T5, T6, A1, A2, Fz, Cz, Pz). The following electrodes were missing or displaced: none.  The occipital dominant rhythm was 4-6 Hz. This activity is reactive to stimulation. There are runs of faster activity most prominent over the left frontal region that are favored to be arousals. Drowsiness was manifested by background fragmentation; deeper stages of sleep were identified by K complexes and sleep spindles. There iws focal slowing over the left frontal region. There were occasional L occipital sharp waves. There were no electrographic seizures identified. Photic stimulation and hyperventilation were not performed.   Impression and clinical correlation: This EEG was obtained while awake and asleep and is abnormal due to: - moderate diffuse slowing indicative of global cerebral dysfunction - left focal slowing indicative of superimposed focal cerebral dysfunction in that region - sharp waves in the left occipital region indicating increased epileptogenicity in that region  No electrographic seizures were captured during this recording.  Bing Neighbors, MD [facility] [phone]  If 7pm- 7am, please page neurology on call as listed in AMION.

## 2023-03-08 NOTE — Progress Notes (Signed)
[facility] Progress Note  Patient: Cody [name]    ZOX:[id number]  DOA: 03/05/2023     Date of Service: the patient was seen and examined on 03/08/2023  Chief Complaint  Patient presents with   Memory Loss   Brief hospital course: Cody [name] is a 77 y.o. male with medical history significant of hypertension, HFpEF, alcohol use disorder, BPH, who presents to the ED due to altered mental status.  Patient's son stated that he is having shortness of breath for past 1 week productive cough.  For past 2 to 3 days patient is talking randomly which does not make any sense and having memory issues.  Patient drinks 10 beers per day and active smoker. ED w/up: VS stable Hyponatremia sodium 117, mild acidosis CO2 18, mild hyperglycemia blood glucose 154, elevated AST and ALT, leukocytosis WBC 13.2 CXR:  Cardiomegaly with diffuse interstitial edema pattern. Left lower lobe airspace consolidation concerning for superimposed pneumonia. Suspect small effusions. CT head:  old CVA and vasogenic edema MRI brain: . Multiple contrast-enhancing lesions in the right frontal lobe, left occipital lobe, and superior right cerebellum with surrounding vasogenic edema. 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 Small dural-based contrast-enhancing lesion along the left frontal convexity could represent a small meningimoa, but metastatic disease is not excluded. CT Chest/a/p: Large pericardial effusion. Please correlate with and new symptoms including for tamponade.   Moderate right and small left effusion with some loculated components on the left. Adjacent parenchymal opacities. Atelectasis versus infiltrate. Recommend follow up after clearance of the effusions. Few borderline enlarged lymph nodes identified including mediastinum, upper retroperitoneum. Enlarged prostate with mass effect along the bladder with bladder wall thickening. Please correlate with patient's PSA. No bowel obstruction or free air.  There is scattered mesenteric haziness with trace ascites. There are some small areas of wall thickening along the colon but this could simply relate to the other signs of edema.  Assessment and Plan:  # Acute metabolic encephalopathy most likely due to metastatic brain lesions and vasogenic edema Symptoms improving, currently patient is back to his baseline Patient was agitated at last night on 5/8 the patient was given lorazepam 4 mg, trazodone 50 mg, and melatonin 5 mg the patient was very sleepy in the morning on 5/9.  CT head was done did not show any acute/new findings. Pulmonary critical care was consulted, ABG was done, EEG was don.  Patient's mental status gradually improved.    # Metastatic brain lesion with vasogenic edema CT scan and MRI brain reviewed S/p Decadron 8 mg IV x 1 dose, s/p Decadron 4 mg p.o. daily till 5/10 5/1 Decadron 2 mg p.o. twice daily Continue Keppra 500 mg IV twice daily for seizure prophylaxis Neurosurgery consulted, recommended no intervention at this time but if there is no diagnosis then brain biopsy can be done next week. 5/9 CT Head: Unchanged edema associated with known lesions in the right frontal lobe, left occipital lobe, and right cerebellum. No significant mass effect. No evidence of a new intracranial abnormality.   # Pericardial effusion CT chest shows large pericardial effusion 2D echocardiogram showed large pericardial fusion with possible tamponade Cardiology was consulted, s/p pericardiocentesis 1.1 liters bloody fluid was tapped and sample was sent for cytology Pericardial catheter intact, may need drainage again tomorrow a.m. Cardiology consult appreciated Patient feels significant improvement in the shortness of breath 5/9 200 cc pericardial fluid was drained by cardiology 5/10 pigtail catheter was removed today by cardiology.   # Acute onset A-fib  with controlled ventricular rate, developed after pericardiocentesis Continue  monitor on telemetry Cardiology following, no need of anticoagulation at this time   # Bilateral pleural effusion, right >left most likely due to metastatic disease Unknown primary Patient got pericardiocentesis done today so we will postpone thoracentesis CT chest/AP reviewed as above Oncologist consulted, recommended therapeutic thoracentesis, sample for cytology cell count and other routine labs. Patient may have underlying lung cancer due to history of smoking. 5/9 CXR: Slight worsening of bilateral lung opacities which may reflect edema or pneumonia.  Persistent bilateral pleural effusions. Continue ceftriaxone and doxycycline for possible underlying pneumonia 5/9 s/p right thoracentesis 500 mL fluid was tapped by [facility] attending, fluid studies are consistent with transudative fluid.  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 Uric acid 9.7, elevated Started colchicine 0.6 mg p.o. daily Hold allopurinol for now   # CAD, HTN and chronic diastolic CHF Patient was on aspirin, Coreg, Entresto, statin, med rec shows patient is not taking medication, seems noncompliant. Continue to monitor BP and titrate medications accordingly   # COPD, possible underlying pneumonia Continue empiric antibiotics ceftriaxone and doxycycline Continue Decadron Continue DuoNeb as needed   # Alcohol use disorder Long-term history of alcohol use disorder that is currently active.  No evidence of withdrawal on exam at this time. - CIWA protocol with Ativan as needed - TOC consultation for resources - Daily folic acid, thiamine and  multivitamin  # BPH and urinary retention, Foley catheter was inserted on 5/8, 620 mL urine retention as per bladder scan Continue Flomax and finasteride  # Elevated LFTs most likely secondary to alcohol use disorder LFTs gradually improving, continue to monitor   Body mass index is 19.38 kg/m.  Interventions:       Diet: Regular diet and protein supplement  DVT Prophylaxis: SCD, pharmacological prophylaxis contraindicated due to procedure brain mets/vasogenic edema    Advance goals of care discussion: Full code  Family Communication: family was present at bedside, at the time of interview.  The pt provided permission to discuss medical plan with the family. Opportunity was given to ask question and all questions were answered satisfactorily.   Disposition:  Pt is from Home, admitted with pericardial fusion, bilateral pleural effusion, metastatic disease, brain mets with vasogenic edem, which precludes a safe discharge. Discharge to Home, when stable, may need 5 to 7 days to improve..  Subjective: No significant overnight events, patient was awake and alert in the morning, stated that he is feeling better.  Denies any complaints.  Patient was little bit short of breath.  Patient was asking when he can go home and drink beer and started smoking.  Counseling was done for smoking cessation and abstinence from alcohol. Patient's daughters were at bedside.   Physical Exam: General: NAD, lying comfortably Appear in no distress Eyes: PERRLA ENT: Oral Mucosa Clear, dry Neck: no JVD,  Cardiovascular: Irregular rhythm,  no Murmur,  Respiratory: Good air entry bilaterally, bibasilar crackles, no wheezing appreciated.   Abdomen: Bowel Sound present, Soft and no tenderness,  Skin: no rashes Extremities: no Pedal edema, no calf tenderness Neurologic: without any new focal findings Gait not checked due to patient safety concerns  Vitals:   03/08/23 1100 03/08/23 1200 03/08/23 1300  03/08/23 1400  BP: 112/61 (!) 105/48 122/62 110/61  Pulse: 75 68 79 76  Resp: 14 13 14 13   Temp:  97.8 F (36.6 C)    TempSrc:  Oral    SpO2: 100% 99% 100% 100%  Weight:      Height:        Intake/Output Summary (Last 24 hours) at 03/08/2023 1448 Last data filed at 03/08/2023 1430 Gross per 24 hour  Intake 2250.02 ml  Output 1450 ml  Net 800.02 ml   Filed Weights   03/06/23 0000 03/06/23 0500 03/07/23 0500  Weight: 45 kg 45 kg 48.9 kg    Data Reviewed: I have personally reviewed and interpreted daily labs, tele strips, imagings as discussed above. I reviewed all nursing notes, pharmacy notes, vitals, pertinent old records I have discussed plan of care as described above with RN and patient/family.  CBC: Recent Labs  Lab 03/05/23 1112 03/06/23 0258 03/07/23 0308 03/07/23 1628 03/08/23 0539  WBC 13.2* 11.3* 14.2*  --  12.1*  NEUTROABS  --  9.2*  --   --   --   HGB 10.9* 10.9* 10.0*  --  9.7*  HCT 32.8* 32.2* 31.0* 32.6* 30.4*  MCV 93.4 91.5 96.9  --  97.1  PLT 357 309 274  --  260   Basic Metabolic Panel: Recent Labs  Lab 03/05/23 1112 03/05/23 1656 03/06/23 0258 03/06/23 0849 03/06/23 1455 03/07/23 0308 03/07/23 0719 03/07/23 1221 03/07/23 1601 03/08/23 0539  NA 117*   < > 124* 125*   < > 130* 129* 131* 131* 134*  K 4.9  --  5.0  --   --  4.5  --   --   --  3.7  CL 87*  --  95*  --   --  102  --   --   --  107  CO2 18*  --  17*  --   --  18*  --   --   --  23  GLUCOSE 154*  --  129*  --   --  100*  --   --   --  73  BUN 39*  --  42*  --   --  43*  --   --   --  28*  CREATININE 1.18  --  0.99  --   --  0.89  --   --   --  0.82  CALCIUM 8.5*  --  8.5*  --   --  8.0*  --   --   --  7.7*  MG  --   --   --  2.5*  --  2.5*  --   --   --  2.3  PHOS  --   --   --  4.3  --  4.5  --   --   --  3.1   < > = values in this interval not displayed.    Studies: DG Chest Port 1 View  Result Date: 03/07/2023 CLINICAL DATA:  Status post right thoracentesis EXAM:  PORTABLE CHEST 1 VIEW COMPARISON:  Chest radiograph dated 03/07/2023 FINDINGS: Lines/tubes: Partially imaged inferior approach catheter projects over the left upper quadrant. Chest: Improved lung aeration with persistent left-greater-than-right interstitial and patchy opacities. Pleura: Decreased right pleural effusion. Persistent moderate left pleural effusion. No pneumothorax. Heart/mediastinum: Similar enlarged cardiomediastinal silhouette. Bones: No acute osseous abnormality. IMPRESSION: 1. Decreased right pleural effusion status post thoracentesis. No  pneumothorax. 2. Persistent moderate left pleural effusion. 3. Improved lung aeration with persistent left-greater-than-right interstitial and patchy opacities. Electronically Signed   By: Agustin Cree M.D.   On: 03/07/2023 16:35    Scheduled Meds:  Chlorhexidine Gluconate Cloth  6 each Topical Daily   colchicine  0.6 mg Oral Daily   [START ON 03/09/2023] dexamethasone  2 mg Oral Q12H   feeding supplement  237 mL Oral TID BM   folic acid  1 mg Oral Daily   multivitamin with minerals  1 tablet Oral Daily   sodium chloride flush  3 mL Intravenous Q12H   sodium chloride flush  3 mL Intravenous Q12H   tamsulosin  0.4 mg Oral Daily   thiamine  100 mg Oral Daily   Or   thiamine  100 mg Intravenous Daily   Continuous Infusions:  sodium chloride     sodium chloride 1 mL/kg/hr (03/08/23 1430)   cefTRIAXone (ROCEPHIN)  IV 2 g (03/08/23 1439)   doxycycline (VIBRAMYCIN) IV Stopped (03/08/23 1123)   levETIRAcetam Stopped (03/08/23 0704)   PRN Meds: sodium chloride, acetaminophen **OR** acetaminophen, ipratropium-albuterol, ondansetron **OR** ondansetron (ZOFRAN) IV, sodium chloride flush  Time spent: 50 minutes  Author: Gillis Santa. MD [facility] 03/08/2023 2:48 PM  To reach On-call, see care teams to locate the attending and reach out to them via [email or web address]. If 7PM-7AM, please contact night-coverage If you still have difficulty reaching  the attending provider, please page the [facility] (Director on Call) for [facility] on amion for assistance.

## 2023-03-08 NOTE — Progress Notes (Addendum)
Hematology/Oncology Progress note [phone] [phone]     Patient Care Team: Orson Eva, NP as PCP - General (Nurse Practitioner)   Name of the patient: Cody [name]  [id number]  10/18/1946  Date of visit: 03/08/23   INTERVAL HISTORY-   S/p pericardiocentesis with removal of 1.1 L of bloody fluid.  Pigtail catheter left in place.  -Cytology came back positive for malignancy.  Metastatic adenocarcinoma, compatible with pulmonary adenocarcinoma  -s/p  thoracentesis, cytology is pending. 2 daughters at the bedside.  Patient is lethargic   Allergies  Allergen Reactions   Shellfish Allergy Hives    Only allergic to crabs    Patient Active Problem List   Diagnosis Date Noted   Metastasis to brain (HCC) 03/06/2023   Acute encephalopathy 03/05/2023   Acute hyponatremia 03/05/2023   Pericardial effusion 03/05/2023   Vasogenic edema (HCC) 03/05/2023   Brain mass 03/05/2023   Pleural effusion, bilateral 03/05/2023   Alcohol use disorder 03/05/2023   BPH (benign prostatic hyperplasia) 03/05/2023   Elevated LFTs 03/05/2023   Pneumonia of left lower lobe due to infectious organism 03/05/2023   Congestive heart failure (HCC) 12/24/2022   Chronic obstructive pulmonary disease (HCC) 12/24/2022   Mixed hyperlipidemia 12/24/2022   Nicotine dependence with nicotine-induced disorder 12/24/2022   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   Unstable angina (HCC) 08/13/2018   Syncope 07/27/2018     Past Medical History:  Diagnosis Date   Alcohol dependence (HCC)    CHF (congestive heart failure) (HCC)    Hypertension    Tobacco abuse      Past Surgical History:  Procedure Laterality Date   APPENDECTOMY     LEFT HEART CATH AND CORONARY ANGIOGRAPHY N/A 08/25/2018   Procedure: LEFT HEART CATH AND CORONARY ANGIOGRAPHY;  Surgeon: Laurier Nancy, MD;  Location: [facility] INVASIVE CV [facility];  Service: Cardiovascular;  Laterality: N/A;   PERICARDIOCENTESIS N/A 03/06/2023    Procedure: PERICARDIOCENTESIS;  Surgeon: Marcina Millard, MD;  Location: [facility] INVASIVE CV [facility];  Service: Cardiovascular;  Laterality: N/A;    Social History   Socioeconomic History   Marital status: Married    Spouse name: Not on file   Number of children: Not on file   Years of education: Not on file   Highest education level: Not on file  Occupational History   Not on file  Tobacco Use   Smoking status: Every Day    Packs/day: .5    Types: Cigarettes    Passive exposure: Current   Smokeless tobacco: Never  Vaping Use   Vaping Use: Never used  Substance and Sexual Activity   Alcohol use: Yes    Alcohol/week: 35.0 standard drinks of alcohol    Types: 35 Cans of beer per week   Drug use: Never   Sexual activity: Not Currently    Birth control/protection: None  Other Topics Concern   Not on file  Social History Narrative   Not on file   Social Determinants of Health   Financial Resource Strain: Low Risk  (08/25/2018)   Overall Financial Resource Strain (CARDIA)    Difficulty of Paying Living Expenses: Not very hard  Food Insecurity: Not on file  Transportation Needs: Unknown (08/25/2018)   PRAPARE - Transportation    Lack of Transportation (Medical): No    Lack of Transportation (Non-Medical): Not on file  Physical Activity: Not on file  Stress: No Stress Concern Present (08/25/2018)   [employer] of Occupational Health - Occupational Stress Questionnaire  Feeling of Stress : Only a little  Social Connections: Not on file  Intimate Partner Violence: Not on file     History reviewed. No pertinent family history.   Current Facility-Administered Medications:    0.9 %  sodium chloride infusion, 250 mL, Intravenous, PRN, Tang, Cheryln Manly, PA-C   [EXPIRED] 0.9% sodium chloride infusion, 3 mL/kg/hr, Intravenous, Continuous, Stopped at 03/07/23 0400 **FOLLOWED BY** 0.9% sodium chloride infusion, 1 mL/kg/hr, Intravenous, Continuous, Tang, Cheryln Manly,  PA-C, Last Rate: 45 mL/hr at 03/08/23 1430, 1 mL/kg/hr at 03/08/23 1430   acetaminophen (TYLENOL) tablet 650 mg, 650 mg, Oral, Q6H PRN **OR** acetaminophen (TYLENOL) suppository 650 mg, 650 mg, Rectal, Q6H PRN, Verdene Lennert, MD   cefTRIAXone (ROCEPHIN) 2 g in sodium chloride 0.9 % 100 mL IVPB, 2 g, Intravenous, Q24H, Verdene Lennert, MD, Last Rate: 200 mL/hr at 03/08/23 1439, 2 g at 03/08/23 1439   Chlorhexidine Gluconate Cloth 2 % PADS 6 each, 6 each, Topical, Daily, Gillis Santa, MD, 6 each at 03/08/23 1000   colchicine tablet 0.6 mg, 0.6 mg, Oral, Daily, Gillis Santa, MD, 0.6 mg at 03/08/23 0914   [START ON 03/09/2023] dexamethasone (DECADRON) tablet 2 mg, 2 mg, Oral, Q12H, Gillis Santa, MD   doxycycline (VIBRAMYCIN) 100 mg in sodium chloride 0.9 % 250 mL IVPB, 100 mg, Intravenous, BID, Dgayli, Lianne Bushy, MD, Stopped at 03/08/23 1123   feeding supplement (ENSURE ENLIVE / ENSURE PLUS) liquid 237 mL, 237 mL, Oral, TID BM, Gillis Santa, MD, 237 mL at 03/08/23 1441   folic acid (FOLVITE) tablet 1 mg, 1 mg, Oral, Daily, Verdene Lennert, MD, 1 mg at 03/08/23 0914   ipratropium-albuterol (DUONEB) 0.5-2.5 (3) MG/3ML nebulizer solution 3 mL, 3 mL, Nebulization, Q6H PRN, Gillis Santa, MD   levETIRAcetam (KEPPRA) IVPB 500 mg/100 mL premix, 500 mg, Intravenous, Q12H, Verdene Lennert, MD, Paused at 03/08/23 0704   multivitamin with minerals tablet 1 tablet, 1 tablet, Oral, Daily, Verdene Lennert, MD, 1 tablet at 03/08/23 0914   ondansetron (ZOFRAN) tablet 4 mg, 4 mg, Oral, Q6H PRN **OR** ondansetron (ZOFRAN) injection 4 mg, 4 mg, Intravenous, Q6H PRN, Verdene Lennert, MD   sodium chloride flush (NS) 0.9 % injection 3 mL, 3 mL, Intravenous, Q12H, Verdene Lennert, MD, 3 mL at 03/08/23 0915   sodium chloride flush (NS) 0.9 % injection 3 mL, 3 mL, Intravenous, Q12H, Tang, Cheryln Manly, PA-C, 3 mL at 03/08/23 0915   sodium chloride flush (NS) 0.9 % injection 3 mL, 3 mL, Intravenous, PRN, Tang, Cheryln Manly,  PA-C   tamsulosin [facility]) capsule 0.4 mg, 0.4 mg, Oral, Daily, Verdene Lennert, MD, 0.4 mg at 03/08/23 1610   thiamine (VITAMIN B1) tablet 100 mg, 100 mg, Oral, Daily, 100 mg at 03/08/23 0915 **OR** thiamine (VITAMIN B1) injection 100 mg, 100 mg, Intravenous, Daily, Verdene Lennert, MD, 100 mg at 03/06/23 0858   Physical exam:  Vitals:   03/08/23 1100 03/08/23 1200 03/08/23 1300 03/08/23 1400  BP: 112/61 (!) 105/48 122/62 110/61  Pulse: 75 68 79 76  Resp: 14 13 14 13   Temp:  97.8 F (36.6 C)    TempSrc:  Oral    SpO2: 100% 99% 100% 100%  Weight:      Height:       Physical Exam HENT:     Head: Normocephalic.  Eyes:     General: No scleral icterus. Cardiovascular:     Rate and Rhythm: Normal rate.     Pulses: Normal pulses.  Pulmonary:     Effort:  Pulmonary effort is normal. No respiratory distress.  Abdominal:     General: There is no distension.     Palpations: Abdomen is soft.     Tenderness: There is no abdominal tenderness.  Musculoskeletal:     Cervical back: Neck supple.  Skin:    Findings: No rash.  Neurological:     Comments: Lethargic       Labs    Latest Ref Rng & Units 03/08/2023    5:39 AM 03/07/2023    4:28 PM 03/07/2023    3:08 AM  CBC  WBC 4.0 - 10.5 K/uL 12.1   14.2   Hemoglobin 13.0 - 17.0 g/dL 9.7   16.1   Hematocrit 39.0 - 52.0 % 30.4  32.6  31.0   Platelets 150 - 400 K/uL 260   274       Latest Ref Rng & Units 03/08/2023    5:39 AM 03/07/2023    4:28 PM 03/07/2023    4:01 PM  CMP  Glucose 70 - 99 mg/dL 73     BUN 8 - 23 mg/dL 28     Creatinine 0.96 - 1.24 mg/dL 0.45     Sodium 409 - 811 mmol/L 134   131   Potassium 3.5 - 5.1 mmol/L 3.7     Chloride 98 - 111 mmol/L 107     CO2 22 - 32 mmol/L 23     Calcium 8.9 - 10.3 mg/dL 7.7     Total Protein 6.5 - 8.1 g/dL 5.2  5.8    Total Bilirubin 0.3 - 1.2 mg/dL 0.7     Alkaline Phos 38 - 126 U/L 52     AST 15 - 41 U/L 90     ALT 0 - 44 U/L 122        RADIOGRAPHIC STUDIES: I have personally  reviewed the radiological images as listed and agreed with the findings in the report. DG Chest Port 1 View  Result Date: 03/07/2023 CLINICAL DATA:  Status post right thoracentesis EXAM: PORTABLE CHEST 1 VIEW COMPARISON:  Chest radiograph dated 03/07/2023 FINDINGS: Lines/tubes: Partially imaged inferior approach catheter projects over the left upper quadrant. Chest: Improved lung aeration with persistent left-greater-than-right interstitial and patchy opacities. Pleura: Decreased right pleural effusion. Persistent moderate left pleural effusion. No pneumothorax. Heart/mediastinum: Similar enlarged cardiomediastinal silhouette. Bones: No acute osseous abnormality. IMPRESSION: 1. Decreased right pleural effusion status post thoracentesis. No pneumothorax. 2. Persistent moderate left pleural effusion. 3. Improved lung aeration with persistent left-greater-than-right interstitial and patchy opacities. Electronically Signed   By: Agustin Cree M.D.   On: 03/07/2023 16:35   ECHOCARDIOGRAM LIMITED  Result Date: 03/07/2023    ECHOCARDIOGRAM LIMITED REPORT   Patient Name:   Trampas Dave    Date of Exam: 03/07/2023 Medical Rec #:  [id number]  Height:       60.0 in Accession #:    [id number] Weight:       107.8 lb Date of Birth:  November 30, 1945 BSA:          1.435 m Patient Age:    76 years   BP:           92/60 mmHg Patient Gender: M          HR:           87 bpm. Exam Location:  [facility] Procedure: Limited Echo, Color Doppler and Cardiac Doppler Indications:     Tamponade I31.4  History:         Patient has  prior history of Echocardiogram examinations, most                  recent 03/06/2023. CHF; Risk Factors:Hypertension. Tobacco abuse.  Sonographer:     Cristela Blue Referring Phys:  [id number] CARALYN HUDSON Diagnosing Phys: Marcina Millard MD IMPRESSIONS  1. Left ventricular ejection fraction, by estimation, is 60 to 65%. The left ventricle has normal function. The left ventricle has no regional wall motion abnormalities. Indeterminate  diastolic filling due to E-A fusion.  2. Right ventricular systolic function is normal. The right ventricular size is normal.  3. Moderate pericardial effusion.  4. The mitral valve is normal in structure. Mild mitral valve regurgitation. No evidence of mitral stenosis.  5. The aortic valve is normal in structure. Aortic valve regurgitation is mild. No aortic stenosis is present.  6. The inferior vena cava is normal in size with greater than 50% respiratory variability, suggesting right atrial pressure of 3 mmHg. FINDINGS  Left Ventricle: Left ventricular ejection fraction, by estimation, is 60 to 65%. The left ventricle has normal function. The left ventricle has no regional wall motion abnormalities. The left ventricular internal cavity size was normal in size. There is  no left ventricular hypertrophy. Indeterminate diastolic filling due to E-A fusion. Right Ventricle: The right ventricular size is normal. No increase in right ventricular wall thickness. Right ventricular systolic function is normal. Left Atrium: Left atrial size was normal in size. Right Atrium: Right atrial size was normal in size. Pericardium: Pericardial effusion much reduced after pericardiocenetesis. No right atrial or right ventricular diastolic collapse. No evidence for preicardial tamonade. A moderately sized pericardial effusion is present. Mitral Valve: The mitral valve is normal in structure. Mild mitral valve regurgitation. No evidence of mitral valve stenosis. Tricuspid Valve: The tricuspid valve is normal in structure. Tricuspid valve regurgitation is mild . No evidence of tricuspid stenosis. Aortic Valve: The aortic valve is normal in structure. Aortic valve regurgitation is mild. Aortic regurgitation PHT measures 436 msec. No aortic stenosis is present. Pulmonic Valve: The pulmonic valve was normal in structure. Pulmonic valve regurgitation is not visualized. No evidence of pulmonic stenosis. Aorta: The aortic root is normal in  size and structure. Venous: The inferior vena cava is normal in size with greater than 50% respiratory variability, suggesting right atrial pressure of 3 mmHg. IAS/Shunts: No atrial level shunt detected by color flow Doppler. Additional Comments: Spectral Doppler performed. Color Doppler performed.  LEFT VENTRICLE PLAX 2D LVIDd:         4.40 cm LVIDs:         2.80 cm LV PW:         0.90 cm LV IVS:        0.80 cm LVOT diam:     2.00 cm LVOT Area:     3.14 cm  LEFT ATRIUM         Index LA diam:    2.80 cm 1.95 cm/m  AORTIC VALVE AI PHT:      436 msec  AORTA Ao Root diam: 3.00 cm MITRAL VALVE                TRICUSPID VALVE MV Area (PHT): 4.99 cm     TR Peak grad:   24.0 mmHg MV Decel Time: 152 msec     TR Vmax:        245.00 cm/s MV E velocity: 126.00 cm/s  SHUNTS                             Systemic Diam: 2.00 cm Marcina Millard MD Electronically signed by Marcina Millard MD Signature Date/Time: 03/07/2023/1:09:22 PM    Final    CT HEAD WO CONTRAST ( )  Result Date: 03/07/2023 CLINICAL DATA:  Neuro deficit, acute, stroke suspected. Altered mental status. Memory loss. EXAM: CT HEAD WITHOUT CONTRAST TECHNIQUE: Contiguous axial images were obtained from the base of the skull through the vertex without intravenous contrast. RADIATION DOSE REDUCTION: This exam was performed according to the departmental dose-optimization program which includes automated exposure control, adjustment of the mA and/or kV according to patient size and/or use of iterative reconstruction technique. COMPARISON:  Head CT and MRI 03/05/2023 FINDINGS: Brain: There is no evidence of an acute cortically based infarct, intracranial hemorrhage, midline shift, or extra-axial fluid collection. The ventricles are normal in size. Mild-to-moderate vasogenic edema in the right frontal lobe, left occipital lobe, and right cerebellar hemisphere is unchanged and without significant associated mass effect. Mild to moderate  hypodensities elsewhere in the cerebral white matter bilaterally are nonspecific but compatible with chronic small vessel ischemic disease. 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 Vascular: Calcified atherosclerosis at the skull base. No hyperdense vessel. Skull: No acute fracture or suspicious osseous lesion. Sinuses/Orbits: Minimal mucosal thickening in the paranasal sinuses. Clear mastoid air cells. Unremarkable orbits. Other: None. IMPRESSION: 1. Unchanged edema associated with known lesions in the right frontal lobe, left occipital lobe, and right cerebellum. No significant mass effect. 2. No evidence of a new intracranial abnormality. Electronically Signed   By: Sebastian Ache M.D.   On: 03/07/2023 11:23   DG Chest Port 1 View  Result Date: 03/07/2023 CLINICAL DATA:  Pleural effusion. EXAM: PORTABLE CHEST 1 VIEW COMPARISON:  Chest radiograph and CT 03/05/2023 FINDINGS: The cardiac silhouette remains enlarged. Aortic atherosclerosis is noted. Veiling opacities in the lower lungs are consistent with persistent small to moderate pleural effusions as shown on CT. Bilateral interstitial and patchy airspace opacities have slightly worsened. No pneumothorax is identified. IMPRESSION: 1. Slight worsening of bilateral lung opacities which may reflect edema or pneumonia. 2. Persistent bilateral pleural effusions. Electronically Signed   By: Sebastian Ache M.D.   On: 03/07/2023 09:50   US Venous Img Lower Bilateral (DVT)  Result Date: 03/06/2023 CLINICAL DATA:  Lower extremity edema EXAM: BILATERAL LOWER EXTREMITY VENOUS DOPPLER ULTRASOUND TECHNIQUE: Gray-scale sonography with graded compression, as well as color Doppler and duplex ultrasound were performed to evaluate the lower extremity deep venous systems from the level of the common femoral vein and including the common femoral, femoral, profunda femoral, popliteal and calf veins including the posterior tibial, peroneal and gastrocnemius veins when  visible. The superficial great saphenous vein was also interrogated. Spectral Doppler was utilized to evaluate flow at rest and with distal augmentation maneuvers in the common femoral, femoral and popliteal veins. COMPARISON:  None Available. FINDINGS: RIGHT LOWER EXTREMITY Common Femoral Vein: No evidence of thrombus. Normal compressibility, respiratory phasicity and response to augmentation. Saphenofemoral Junction: No evidence of thrombus. Normal compressibility and flow on color Doppler imaging. Profunda Femoral Vein: No evidence of thrombus. Normal compressibility and flow on color Doppler imaging. Femoral Vein: No evidence of thrombus. Normal compressibility, respiratory phasicity and response to augmentation. Popliteal Vein: No evidence of thrombus. Normal compressibility, respiratory phasicity and response to augmentation. Calf Veins: No evidence of thrombus. Normal compressibility and  flow on color Doppler imaging. Superficial Great Saphenous Vein: No evidence of thrombus. Normal compressibility. Venous Reflux:  None. Other Findings:  None. LEFT LOWER EXTREMITY Common Femoral Vein: No evidence of thrombus. Normal compressibility, respiratory phasicity and response to augmentation. Saphenofemoral Junction: No evidence of thrombus. Normal compressibility and flow on color Doppler imaging. Profunda Femoral Vein: No evidence of thrombus. Normal compressibility and flow on color Doppler imaging. Femoral Vein: No evidence of thrombus. Normal compressibility, respiratory phasicity and response to augmentation. Popliteal Vein: No evidence of thrombus. Normal compressibility, respiratory phasicity and response to augmentation. Calf Veins: No evidence of thrombus. Normal compressibility and flow on color Doppler imaging. Superficial Great Saphenous Vein: No evidence of thrombus. Normal compressibility. Venous Reflux:  None. Other Findings:  None. IMPRESSION: No evidence of deep venous thrombosis in either lower  extremity. Electronically Signed   By: Alcide Clever M.D.   On: 03/06/2023 19:43   ECHOCARDIOGRAM COMPLETE  Result Date: 03/06/2023    ECHOCARDIOGRAM REPORT   Patient Name:  Melroy Dillingham    Date of      03/06/2023                           Exam: Medical Rec #: [id number]  Height:      60.0 in Accession #:   [id number] Weight:      99.2 lb Date of Birth: [birth year] BSA:         1.386 m Patient Age:   76 years   BP:          Not listed in chart/Not listed in chart                                        mmHg Patient        M          HR:          Not listed in chart bpm. Gender: Exam Location: [facility] Procedure: 2D Echo, Cardiac Doppler and Color Doppler Indications:     Tamponade I31.4  History:         Patient has prior history of Echocardiogram examinations, most                  recent 03/05/2023. CHF; Risk Factors:Hypertension. Tobacco abuse.  Sonographer:     Cristela Blue Referring Phys:  [id number] [facility] MICHELLE TANG Diagnosing Phys: Marcina Millard MD IMPRESSIONS  1. Left ventricular ejection fraction, by estimation, is 60 to 65%. The left ventricle has normal function. The left ventricle has no regional wall motion abnormalities. Left ventricular diastolic parameters are indeterminate.  2. Right ventricular systolic function is normal. The right ventricular size is normal.  3. Successful pericardiocentesis with removal of 1100 cc serosanguinous pericardial fluid.. Findings are consistent with cardiac tamponade.  4. The mitral valve is normal in structure. No evidence of mitral valve regurgitation. No evidence of mitral stenosis.  5. The aortic valve is normal in structure. Aortic valve regurgitation is not visualized. No aortic stenosis is present.  6. The inferior vena cava is normal in size with greater than 50% respiratory variability, suggesting right atrial pressure of 3 mmHg. FINDINGS  Left Ventricle: Left ventricular ejection fraction, by estimation, is 60 to 65%. The left ventricle has normal function. The left  ventricle has no regional wall motion abnormalities. The left ventricular internal cavity size was  normal in size. There is  no left ventricular hypertrophy. Left ventricular diastolic parameters are indeterminate. Right Ventricle: The right ventricular size is normal. No increase in right ventricular wall thickness. Right ventricular systolic function is normal. Left Atrium: Left atrial size was normal in size. Right Atrium: Right atrial size was normal in size. Pericardium: Successful pericardiocentesis with removal of 1100 cc serosanguinous pericardial fluid. There is no evidence of pericardial effusion. The pericardial effusion is circumferential. There is diastolic collapse of the right ventricular free wall. There is evidence of cardiac tamponade. Mitral Valve: The mitral valve is normal in structure. No evidence of mitral valve regurgitation. No evidence of mitral valve stenosis. Tricuspid Valve: The tricuspid valve is normal in structure. Tricuspid valve regurgitation is not demonstrated. No evidence of tricuspid stenosis. Aortic Valve: The aortic valve is normal in structure. Aortic valve regurgitation is not visualized. No aortic stenosis is present. Pulmonic Valve: The pulmonic valve was normal in structure. Pulmonic valve regurgitation is not visualized. No evidence of pulmonic stenosis. Aorta: The aortic root is normal in size and structure. Venous: The inferior vena cava is normal in size with greater than 50% respiratory variability, suggesting right atrial pressure of 3 mmHg. IAS/Shunts: No atrial level shunt detected by color flow Doppler.  LEFT VENTRICLE PLAX 2D LVIDd:         3.85 cm LVIDs:         3.10 cm LV PW:         0.75 cm LV IVS:        1.10 cm  RIGHT VENTRICLE RV Basal diam:  3.40 cm RV Mid diam:    2.50 cm LEFT ATRIUM         Index       RIGHT ATRIUM           Index LA diam:    4.40 cm 3.18 cm/m  RA Area:     12.50 cm                                 RA Volume:   27.90 ml  20.14 ml/m  Marcina Millard MD Electronically signed by Marcina Millard MD Signature Date/Time: 03/06/2023/5:12:27 PM    Final    CARDIAC CATHETERIZATION  Result Date: 03/06/2023 Successful pericardiocentesis with placement of pigtail catheter   ECHOCARDIOGRAM COMPLETE  Result Date: 03/06/2023    ECHOCARDIOGRAM REPORT   Patient Name:   Richerd Azimi    Date of Exam: 03/05/2023 Medical Rec #:  [id number]  Height:       57.0 in Accession #:    [id number] Weight:       100.0 lb Date of Birth:  06-03-1946 BSA:          1.340 m Patient Age:    76 years   BP:           123/76 mmHg Patient Gender: M          HR:           74 bpm. Exam Location:  [facility] Procedure: 2D Echo, Cardiac Doppler and Color Doppler Indications:     I31.3 Pericardial Effusion  History:         Patient has prior history of Echocardiogram examinations, most                  recent 07/27/2018. CHF; Risk Factors:Hypertension, Current  Smoker and Alcohol Abuse.  Sonographer:     Daphine Deutscher RDCS Referring Phys:  [id number] Verdene Lennert Diagnosing Phys: Yvonne Kendall MD IMPRESSIONS  1. Left ventricular ejection fraction, by estimation, is 55 to 60%. The left ventricle has normal function. The left ventricle has no regional wall motion abnormalities. Left ventricular diastolic parameters are consistent with Grade I diastolic dysfunction (impaired relaxation).  2. Right ventricular systolic function is mildly reduced. The right ventricular size is normal. There is normal pulmonary artery systolic pressure.  3. Large pericardial effusion. The pericardial effusion is circumferential. Findings are consistent with cardiac tamponade.  4. The mitral valve is normal in structure. Mild mitral valve regurgitation.  5. Tricuspid valve regurgitation is mild to moderate.  6. The aortic valve is tricuspid. There is mild calcification of the aortic valve. There is mild thickening of the aortic valve. Aortic valve regurgitation is mild to moderate.  7. The  inferior vena cava is dilated in size with <50% respiratory variability, suggesting right atrial pressure of 15 mmHg. Conclusion(s)/Recommendation(s): Findings were conveyed to Dr. Gillis Santa at 8:02 AM on 03/06/2023 by Dr. Cristal [place]. FINDINGS  Left Ventricle: Left ventricular ejection fraction, by estimation, is 55 to 60%. The left ventricle has normal function. The left ventricle has no regional wall motion abnormalities. The left ventricular internal cavity size was normal in size. There is  no left ventricular hypertrophy. Left ventricular diastolic parameters are consistent with Grade I diastolic dysfunction (impaired relaxation). Right Ventricle: The right ventricular size is normal. No increase in right ventricular wall thickness. Right ventricular systolic function is mildly reduced. There is normal pulmonary artery systolic pressure. The tricuspid regurgitant velocity is 1.71 m/s, and with an assumed right atrial pressure of 15 mmHg, the estimated right ventricular systolic pressure is 26.7 mmHg. Left Atrium: Left atrial size was normal in size. Right Atrium: Right atrial size was normal in size. Pericardium: A large pericardial effusion is present. The pericardial effusion is circumferential. The pericardial effusion appears to contain fibrous material. There is diastolic collapse of the right ventricular free wall and excessive respiratory variation in the mitral valve spectral Doppler velocities. There is evidence of cardiac tamponade. Mitral Valve: The mitral valve is normal in structure. There is mild thickening of the mitral valve leaflet(s). Mild mitral valve regurgitation. Tricuspid Valve: The tricuspid valve is normal in structure. Tricuspid valve regurgitation is mild to moderate. Aortic Valve: The aortic valve is tricuspid. There is mild calcification of the aortic valve. There is mild thickening of the aortic valve. Aortic valve regurgitation is mild to moderate. Aortic regurgitation PHT  measures 507 msec. Aortic valve mean gradient measures 2.5 mmHg. Aortic valve peak gradient measures 4.3 mmHg. Aortic valve area, by VTI measures 2.84 cm. Pulmonic Valve: The pulmonic valve was normal in structure. Pulmonic valve regurgitation is trivial. No evidence of pulmonic stenosis. Aorta: The aortic root and ascending aorta are structurally normal, with no evidence of dilitation. Pulmonary Artery: The pulmonary artery is of normal size. Venous: The inferior vena cava is dilated in size with less than 50% respiratory variability, suggesting right atrial pressure of 15 mmHg. IAS/Shunts: The interatrial septum was not well visualized.  LEFT VENTRICLE PLAX 2D LVIDd:         3.50 cm   Diastology LVIDs:         2.50 cm   LV e' medial:    4.46 cm/s LV PW:         0.90 cm   LV E/e' medial:  9.7 LV IVS:        0.80 cm   LV e' lateral:   6.31 cm/s LVOT diam:     2.00 cm   LV E/e' lateral: 6.9 LV SV:         46 LV SV Index:   34 LVOT Area:     3.14 cm  RIGHT VENTRICLE RV Basal diam:  3.10 cm RV S prime:     7.29 cm/s TAPSE (M-mode): 1.3 cm LEFT ATRIUM             Index        RIGHT ATRIUM           Index LA diam:        3.10 cm 2.31 cm/m   RA Area:     10.70 cm LA Vol (A2C):   37.7 ml 28.14 ml/m  RA Volume:   27.40 ml  20.45 ml/m LA Vol (A4C):   25.3 ml 18.88 ml/m LA Biplane Vol: 33.9 ml 25.30 ml/m  AORTIC VALVE AV Area (Vmax):    2.86 cm AV Area (Vmean):   2.60 cm AV Area (VTI):     2.84 cm AV Vmax:           103.69 cm/s AV Vmean:          74.930 cm/s AV VTI:            0.161 m AV Peak Grad:      4.3 mmHg AV Mean Grad:      2.5 mmHg LVOT Vmax:         94.33 cm/s LVOT Vmean:        62.000 cm/s LVOT VTI:          0.145 m LVOT/AV VTI ratio: 0.90 AI PHT:            507 msec  AORTA Ao Root diam: 3.20 cm Ao Asc diam:  3.50 cm MITRAL VALVE               TRICUSPID VALVE MV Area (PHT): 4.28 cm    TR Peak grad:   11.7 mmHg MV Decel Time: 177 msec    TR Vmax:        171.00 cm/s MV E velocity: 43.23 cm/s MV A velocity:  63.73 cm/s  SHUNTS MV E/A ratio:  0.68        Systemic VTI:  0.15 m                            Systemic Diam: 2.00 cm Yvonne Kendall MD Electronically signed by Yvonne Kendall MD Signature Date/Time: 03/06/2023/8:13:55 AM    Final    MR Brain W and Wo Contrast  Result Date: 03/05/2023 CLINICAL DATA:  Mental status change, unknown cause EXAM: MRI HEAD WITHOUT AND WITH CONTRAST TECHNIQUE: Multiplanar, multiecho pulse sequences of the brain and surrounding structures were obtained without and with intravenous contrast. CONTRAST:  5mL GADAVIST GADOBUTROL 1 MMOL/ML IV SOLN COMPARISON:  Same day CT head FINDINGS: Brain: Negative for an acute infarct. No hydrocephalus. No extra-axial fluid collection. Small microhemorrhage in the right lentiform nucleus. Chronic left cerebellar infarct. There is sequela mild-to-moderate chronic microvascular ischemic change. There are multiple lesions, including- -1.0 x 0.7 cm contrast-enhancing lesion in the right frontal lobe with surrounding vasogenic edema (series 18, image 108) - 0.8 x 0.7 cm contrast-enhancing lesions in in the left occipital lobe with surrounding vasogenic edema (series 18, image 69). -  1.2 x 1.1 cm contrast-enhancing lesion in the superior right cerebellum mild surrounding vasogenic edema (series 18, image 50) -0.6 x 0.4 cm dural-based contrast-enhancing lesion along the left frontal convexity (series 93, image 176). Vascular: Normal flow voids. Skull and upper cervical spine: Normal marrow signal. Sinuses/Orbits: No middle ear or mastoid effusion. Mild mucosal thickening bilateral ethmoid air cells. Orbits are unremarkable. Other: None. IMPRESSION: 1. Multiple contrast-enhancing lesions in the right frontal lobe, left occipital lobe, and superior right cerebellum with surrounding vasogenic edema. 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 2. Small dural-based contrast-enhancing lesion along the left frontal convexity could represent a small  meningimoa, but metastatic disease is not excluded. Electronically Signed   By: Lorenza Cambridge M.D.   On: 03/05/2023 16:48   CT CHEST ABDOMEN PELVIS W CONTRAST  Result Date: 03/05/2023 CLINICAL DATA:  Brain mass. Assess for occult malignancy. * Tracking Code: BO * EXAM: CT CHEST, ABDOMEN, AND PELVIS WITH CONTRAST TECHNIQUE: Multidetector CT imaging of the chest, abdomen and pelvis was performed following the standard protocol during bolus administration of intravenous contrast. RADIATION DOSE REDUCTION: This exam was performed according to the departmental dose-optimization program which includes automated exposure control, adjustment of the mA and/or kV according to patient size and/or use of iterative reconstruction technique. CONTRAST:  75mL OMNIPAQUE IOHEXOL 300 MG/ML  SOLN COMPARISON:  Chest CT without contrast 04/28/2015. X-ray chest earlier 03/05/2023 FINDINGS: CT CHEST FINDINGS Cardiovascular: Very large pericardial effusion. The heart itself is nonenlarged. Significant coronary artery calcifications. The thoracic aorta has a normal course and caliber with mild atherosclerotic calcified plaque. Mediastinum/Nodes: Normal caliber thoracic esophagus. Preserved thyroid gland. No specific abnormal lymph node enlargement seen including in the axillary regions, hilum. Prominent right paratracheal node on series 2, image 18 measures 18 by 7 mm. This could be 2 adjacent nodes. Subcarinal node series 2, image 29 measures 2.7 by 1.1 cm, mildly enlarged. Lungs/Pleura: Moderate right and small left effusion. There are some loculated components on the left with fluid tracking along the interlobar fissure. Adjacent parenchymal opacities. Atelectasis versus infiltrate. There is a opacity specifically in the lingula as well. Scattered ground-glass elsewhere in the visualized lungs. No pneumothorax. No obvious mass. Significant breathing motion throughout the examination. A subtle mass lesion could be obscured by the lung  opacity in the effusions. Follow up study could be performed after improvement of the effusions. Musculoskeletal: Mild degenerative changes along the spine. CT ABDOMEN PELVIS FINDINGS Hepatobiliary: Contrast exam in the abdomen is more in the late arterial phase and portal venous phase. Patent portal vein. Gallbladder is present. Small cysts seen in segment 4 of the liver. Pancreas: Unremarkable. No pancreatic ductal dilatation or surrounding inflammatory changes. Spleen: Normal in size without focal abnormality. Adrenals/Urinary Tract: Slight nonspecific thickening of the adrenal glands but unchanged from previous. No enhancing renal mass or collecting system dilatation. There are some small low-attenuation lesions in the left kidney which are too small to completely characterize but likely benign cysts, Bosniak 2 lesions. No specific imaging follow-up. The ureters have normal course and caliber extending down to the bladder. Diffuse wall thickening of the urinary bladder. Stomach/Bowel: On this non oral contrast exam, large bowel has a normal caliber. Redundant course of the sigmoid colon. There are some areas of mild wall thickening along loops of bowel, nonspecific. The stomach is mildly distended with fluid. Mild fold thickening suggested small bowel is nondilated. Vascular/Lymphatic: Normal caliber aorta and IVC with moderate calcified plaque along the aorta and iliac vessels. There  are several enlarged nodes identified in the retroperitoneum and upper abdomen. Example aortocaval node posteriorly on series 2, image 64 measures 17 by 13 mm. No next of the celiac series 2, image 59 measures 16 by 14 mm. Left para-node anteriorly on series 2, image 68 measures 17 by 8 mm. A few small retrocrural nodes are also identified. Reproductive: Enlarged heterogeneous prostate. Please correlate with patient's PSA. Other: Anasarca.  Mesenteric haziness.  Trace ascites.  No free air Musculoskeletal: Mild degenerative changes  along the spine. IMPRESSION: Large pericardial effusion. Please correlate with and new symptoms including for tamponade. Moderate right and small left effusion with some loculated components on the left. Adjacent parenchymal opacities. Atelectasis versus infiltrate. Recommend follow up after clearance of the effusions. Few borderline enlarged lymph nodes identified including mediastinum, upper retroperitoneum. Enlarged prostate with mass effect along the bladder with bladder wall thickening. Please correlate with patient's PSA. No bowel obstruction or free air. There is scattered mesenteric haziness with trace ascites. There are some small areas of wall thickening along the colon but this could simply relate to the other signs of edema. Electronically Signed   By: Karen Kays M.D.   On: 03/05/2023 15:03   DG Chest Port 1 View  Result Date: 03/05/2023 CLINICAL DATA:  Shortness of breath EXAM: PORTABLE CHEST 1 VIEW COMPARISON:  07/27/2018 FINDINGS: Cardiac silhouette is enlarged with diffuse interstitial opacities throughout both lungs compatible with edema related to CHF. Basilar atelectasis noted, worse on the left. Difficult to exclude small effusions. Also left lower lobe retrocardiac opacity obscures the left hemidiaphragm concerning for possible superimposed pneumonia. No pneumothorax. Trachea midline. Aorta atherosclerotic. No acute osseous finding. IMPRESSION: 1. Cardiomegaly with diffuse interstitial edema pattern. 2. Left lower lobe airspace consolidation concerning for superimposed pneumonia. 3. Suspect small effusions. Electronically Signed   By: Judie Petit.  Shick M.D.   On: 03/05/2023 13:20   CT Head Wo Contrast  Result Date: 03/05/2023 CLINICAL DATA:  Mental status change of unknown cause. Memory disturbance. EXAM: CT HEAD WITHOUT CONTRAST TECHNIQUE: Contiguous axial images were obtained from the base of the skull through the vertex without intravenous contrast. RADIATION DOSE REDUCTION: This exam was  performed according to the departmental dose-optimization program which includes automated exposure control, adjustment of the mA and/or kV according to patient size and/or use of iterative reconstruction technique. COMPARISON:  07/27/2018 FINDINGS: Brain: No focal abnormality affects the brainstem. Question mild edema and mass effect in the inferior right cerebellum. 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 There is abnormal vasogenic edema within the right posterior frontal region worrisome for an underlying mass lesion. In the left hemisphere, there is abnormal vasogenic edema in the occipital lobe worrisome for the presence of an occult mass lesion. No visible hemorrhage. Hydrocephalus or extra-axial collection. Vascular: There is atherosclerotic calcification of the major vessels at the base of the brain. Skull: No calvarial abnormality. Sinuses/Orbits: Clear/normal Other: None IMPRESSION: 1. Abnormal vasogenic edema in the right posterior frontal region and in the left occipital lobe worrisome for the presence of occult mass lesions. Question mild edema and mass effect in the inferior right cerebellum. No visible hemorrhage. MRI with and without contrast is recommended for further evaluation. 2. 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 3. Atherosclerotic calcification of the major vessels at the base of the brain. Electronically Signed   By: Paulina Fusi M.D.   On: 03/05/2023 12:52    Assessment and plan-   # Multiple brain mass with vasogenic edema.  Neurosurgery recommendation reviewed. 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 Prognosis is poor. Patient is lethargic. I had a lengthy discussion with patient's 2 daughters at the bedside, as well as daughter Irving Burton via facetime.  Discussed about diagnosis.  Family is aware about that patient is  critically ill, his condition is not curable. Goals of care is palliative intent.  CODE STATUS was discussed with patient.  I recommend DNR/DNI.  Currently patient's family request to keep patient has full code, daughter plans to discuss with the rest of the family members.  Family is interested in available treatment options For now, patient is not a candidate for aggressive chemotherapy treatments.  Recommend to consult radiation oncology for evaluation of radiation for brain metastasis. If patient remains stable, may consider immunotherapy in the outpatient setting.-Add PD-L1 staining If he continues to deteriorate, I think hospice comfort care is very reasonable.   # Hyponatremia, sodium has improved.  Off Hypertonic saline # Transaminitis, could be secondary to chronic alcohol use. CIWA protocol    Thank you for allowing me to participate in the care of this patient.   Rickard Patience, MD, PhD Hematology Oncology 03/08/2023

## 2023-03-08 NOTE — Progress Notes (Signed)
SUBJECTIVE: Cody [name] is a 77yo [place] male with a PMH of HFpEF, alcohol use disorder, ongoing tobacco abuse, HTN who presented to [facility] ED 03/05/2023 with altered mental status (speaking nonsensically and having difficulty with his memory), generalized weakness, shortness of breath, and left-sided chest discomfort.  He was admitted to stepdown and head CT obtained showed abnormal vasogenic edema in the right posterior frontal region and left occipital lobe worrisome for mass lesions.  CT chest abdomen pelvis revealed a large pericardial effusion, confirmed by echo this morning to be a large circumferential pericardial effusion with findings consistent with cardiac tamponade.    Patient underwent urgent pericardiocentesis by Dr. Darrold Junker and pleural fluid analysis on 03/06/23. Patient developed atrial fibrillation with slow ventricular response post procedure.   Limited echo 03/07/23 showed moderate pericardial effusion with significant reduction post pericardiocentesis without RA or RV diastolic collapse or evidence of pericardial tamponade. [facility] cardiology following with additional bloody fluid aspirated from pigtail catheter on 03/07/23. Patient underwent thoracentesis on 03/07/23.  Pigtail catheter removed this morning by Dr. Darrold Junker.   Vitals:   03/08/23 0400 03/08/23 0500 03/08/23 0600 03/08/23 0700  BP: 119/88 (!) 106/56 101/73 (!) 122/56  Pulse: 65 (!) 54  (!) 103  Resp: 15 14 17 13   Temp:      TempSrc:      SpO2: 100% 100%  98%  Weight:      Height:        Intake/Output Summary (Last 24 hours) at 03/08/2023 0844 Last data filed at 03/08/2023 0644 Gross per 24 hour  Intake 1789.15 ml  Output 1550 ml  Net 239.15 ml    LABS: Basic Metabolic Panel: Recent Labs    03/07/23 0308 03/07/23 0719 03/07/23 1601 03/08/23 0539  NA 130*   < > 131* 134*  K 4.5  --   --  3.7  CL 102  --   --  107  CO2 18*  --   --  23  GLUCOSE 100*  --   --  73  BUN 43*  --   --  28*  CREATININE 0.89  --    --  0.82  CALCIUM 8.0*  --   --  7.7*  MG 2.5*  --   --  2.3  PHOS 4.5  --   --  3.1   < > = values in this interval not displayed.   Liver Function Tests: Recent Labs    03/07/23 0308 03/07/23 1628 03/08/23 0539  AST 119*  --  90*  ALT 142*  --  122*  ALKPHOS 59  --  52  BILITOT 0.7  --  0.7  PROT 5.5* 5.8* 5.2*  ALBUMIN 2.3* 2.4* 2.2*   No results for input(s): "LIPASE", "AMYLASE" in the last 72 hours. CBC: Recent Labs    03/06/23 0258 03/07/23 0308 03/07/23 1628 03/08/23 0539  WBC 11.3* 14.2*  --  12.1*  NEUTROABS 9.2*  --   --   --   HGB 10.9* 10.0*  --  9.7*  HCT 32.2* 31.0* 32.6* 30.4*  MCV 91.5 96.9  --  97.1  PLT 309 274  --  260   Cardiac Enzymes: No results for input(s): "CKTOTAL", "CKMB", "CKMBINDEX", "TROPONINI" in the last 72 hours. BNP: Invalid input(s): "POCBNP" D-Dimer: No results for input(s): "DDIMER" in the last 72 hours. Hemoglobin A1C: No results for input(s): "HGBA1C" in the last 72 hours. Fasting Lipid Panel: No results for input(s): "CHOL", "HDL", "LDLCALC", "TRIG", "CHOLHDL", "LDLDIRECT" in the  last 72 hours. Thyroid Function Tests: Recent Labs    03/06/23 0258  TSH 0.532   Anemia Panel: No results for input(s): "VITAMINB12", "FOLATE", "FERRITIN", "TIBC", "IRON", "RETICCTPCT" in the last 72 hours.   PHYSICAL EXAM General: Well developed, well nourished, in no acute distress HEENT:  Normocephalic and atramatic Neck:  No JVD.  Lungs: Clear bilaterally to auscultation and percussion. Heart: HRRR . Normal S1 and S2 without gallops or murmurs.  Abdomen: Bowel sounds are positive, abdomen soft and non-tender  Msk:  Back normal, normal gait. Normal strength and tone for age. Extremities: No clubbing, cyanosis or edema.   Neuro: Alert and oriented X 3. Psych:  Good affect, responds appropriately  TELEMETRY: atrial fibrillation, HR 76 bpm  ASSESSMENT AND PLAN: Patient resting in bed, family at bedside. Patient continues to be in  atrial fibrillation with slow ventricular rate. Will add amiodarone PO 200 mg daily. Blood thinner contraindicated at this time due to hemorrhagic pericardial effusion. Will continue to follow.    ICD-10-CM   1. Acute hyponatremia  E87.1     2. Vasogenic edema (HCC)  G93.6     3. Pneumonia of left lower lobe due to infectious organism  J18.9       Principal Problem:   Acute encephalopathy Active Problems:   Congestive heart failure (HCC)   Chronic obstructive pulmonary disease (HCC)   Acute hyponatremia   Pericardial effusion   Vasogenic edema (HCC)   Brain mass   Pleural effusion, bilateral   Alcohol use disorder   BPH (benign prostatic hyperplasia)   Elevated LFTs   Pneumonia of left lower lobe due to infectious organism   Metastasis to brain [facility])    [name], FNP-C 03/08/2023 8:44 AM

## 2023-03-08 NOTE — Progress Notes (Signed)
Eeg done 

## 2023-03-08 NOTE — Progress Notes (Signed)
[facility] CLINIC CARDIOLOGY CONSULT NOTE       Patient ID: Cody [name] MRN: [id number] DOB/AGE: 1946/01/01 77 y.o.  Admit date: 03/05/2023 Referring Physician Dr. Gillis Santa Primary Physician Orson Eva, NP  Primary Cardiologist Dr. Adrian Blackwater Reason for Consultation cardiac tamponade  HPI: Cody [name] is a 77yo [place] male with a PMH of HFpEF, alcohol use disorder, ongoing tobacco abuse, HTN who presented to [facility] ED 03/05/2023 with altered mental status (speaking nonsensically and having difficulty with his memory), generalized weakness, shortness of breath, and left-sided chest discomfort.  He was admitted to stepdown and head CT obtained showed abnormal vasogenic edema in the right posterior frontal region and left occipital lobe worrisome for mass lesions.  CT chest abdomen pelvis revealed a large pericardial effusion, confirmed by echo this morning to be a large circumferential pericardial effusion with findings consistent with cardiac tamponade.  [facility] cardiology following for pericardiocentesis and postprocedural management.  Interval history: -No acute events -Remains encephalopathic, but hemodynamically stable -In atrial fibrillation on telemetry with controlled ventricular response -Dr. Darrold Junker aspirated an additional 50 cc of bloody pericardial fluid this morning    Past Medical History:  Diagnosis Date   Alcohol dependence (HCC)    CHF (congestive heart failure) (HCC)    Hypertension    Tobacco abuse     Past Surgical History:  Procedure Laterality Date   APPENDECTOMY     LEFT HEART CATH AND CORONARY ANGIOGRAPHY N/A 08/25/2018   Procedure: LEFT HEART CATH AND CORONARY ANGIOGRAPHY;  Surgeon: Laurier Nancy, MD;  Location: [facility] INVASIVE CV LAB;  Service: Cardiovascular;  Laterality: N/A;   PERICARDIOCENTESIS N/A 03/06/2023   Procedure: PERICARDIOCENTESIS;  Surgeon: Marcina Millard, MD;  Location: [facility] INVASIVE CV LAB;  Service: Cardiovascular;  Laterality: N/A;     Medications Prior to Admission  Medication Sig Dispense Refill Last Dose   tamsulosin (FLOMAX) 0.4 MG CAPS capsule Take 1 capsule (0.4 mg total) by mouth daily. 90 capsule 3 Past Week   allopurinol (ZYLOPRIM) 100 MG tablet Take 100 mg by mouth daily. (Patient not taking: Reported on 12/24/2022)      aspirin EC 81 MG tablet Take 81 mg by mouth daily. (Patient not taking: Reported on 03/05/2023)   Not Taking   atorvastatin (LIPITOR) 80 MG tablet Take 1 tablet by mouth every evening. (Patient not taking: Reported on 03/05/2023)  5 Not Taking   carvedilol (COREG) 25 MG tablet Take 25 mg by mouth 2 (two) times daily. (Patient not taking: Reported on 03/05/2023)  3 Not Taking   colchicine 0.6 MG tablet Take by mouth.      ENTRESTO 24-26 MG Take 1 tablet by mouth 2 (two) times daily. (Patient not taking: Reported on 03/05/2023)  5 Not Taking   ezetimibe (ZETIA) 10 MG tablet Take 10 mg by mouth daily. (Patient not taking: Reported on 03/05/2023)   Not Taking   finasteride (PROSCAR) 5 MG tablet Take 1 tablet (5 mg total) by mouth daily. (Patient not taking: Reported on 03/05/2023) 90 tablet 3 Not Taking   Social History   Socioeconomic History   Marital status: Married    Spouse name: Not on file   Number of children: Not on file   Years of education: Not on file   Highest education level: Not on file  Occupational History   Not on file  Tobacco Use   Smoking status: Every Day    Packs/day: .5    Types: Cigarettes    Passive exposure: Current  Smokeless tobacco: Never  Vaping Use   Vaping Use: Never used  Substance and Sexual Activity   Alcohol use: Yes    Alcohol/week: 35.0 standard drinks of alcohol    Types: 35 Cans of beer per week   Drug use: Never   Sexual activity: Not Currently    Birth control/protection: None  Other Topics Concern   Not on file  Social History Narrative   Not on file   Social Determinants of Health   Financial Resource Strain: Low Risk  (08/25/2018)   Overall  Financial Resource Strain (CARDIA)    Difficulty of Paying Living Expenses: Not very hard  Food Insecurity: Not on file  Transportation Needs: Unknown (08/25/2018)   PRAPARE - Transportation    Lack of Transportation (Medical): No    Lack of Transportation (Non-Medical): Not on file  Physical Activity: Not on file  Stress: No Stress Concern Present (08/25/2018)   [employer] of Occupational Health - Occupational Stress Questionnaire    Feeling of Stress : Only a little  Social Connections: Not on file  Intimate Partner Violence: Not on file    History reviewed. No pertinent family history.    Intake/Output Summary (Last 24 hours) at 03/08/2023 1500 Last data filed at 03/08/2023 1430 Gross per 24 hour  Intake 2250.02 ml  Output 1450 ml  Net 800.02 ml     Vitals:   03/08/23 1100 03/08/23 1200 03/08/23 1300 03/08/23 1400  BP: 112/61 (!) 105/48 122/62 110/61  Pulse: 75 68 79 76  Resp: 14 13 14 13   Temp:  97.8 F (36.6 C)    TempSrc:  Oral    SpO2: 100% 99% 100% 100%  Weight:      Height:        PHYSICAL EXAM General: Ill-appearing thin Asian male, laying at low incline in ICU bed without family present.   HEENT:  Normocephalic and atraumatic. Neck:  + JVD.  Lungs: Normal respiratory effort on room air.   Heart: Irregularly irregular with controlled rate. Chest: Subxiphoid pigtail catheter removed at the bedside by Dr. Darrold Junker, gauze and Tegaderm in place. Abdomen: Non-distended appearing.  Msk: Normal strength and tone for age. Extremities: No obvious swelling  neuro: Somnolent and encephalopathic, moaning to stimuli Psych: Somnolent  Labs: Basic Metabolic Panel: Recent Labs    03/07/23 0308 03/07/23 0719 03/07/23 1601 03/08/23 0539  NA 130*   < > 131* 134*  K 4.5  --   --  3.7  CL 102  --   --  107  CO2 18*  --   --  23  GLUCOSE 100*  --   --  73  BUN 43*  --   --  28*  CREATININE 0.89  --   --  0.82  CALCIUM 8.0*  --   --  7.7*  MG 2.5*  --   --   2.3  PHOS 4.5  --   --  3.1   < > = values in this interval not displayed.    Liver Function Tests: Recent Labs    03/07/23 0308 03/07/23 1628 03/08/23 0539  AST 119*  --  90*  ALT 142*  --  122*  ALKPHOS 59  --  52  BILITOT 0.7  --  0.7  PROT 5.5* 5.8* 5.2*  ALBUMIN 2.3* 2.4* 2.2*    No results for input(s): "LIPASE", "AMYLASE" in the last 72 hours. CBC: Recent Labs    03/06/23 0258 03/07/23 0308 03/07/23 1628 03/08/23 1610  WBC 11.3* 14.2*  --  12.1*  NEUTROABS 9.2*  --   --   --   HGB 10.9* 10.0*  --  9.7*  HCT 32.2* 31.0* 32.6* 30.4*  MCV 91.5 96.9  --  97.1  PLT 309 274  --  260    Cardiac Enzymes: No results for input(s): "CKTOTAL", "CKMB", "CKMBINDEX", "TROPONINIHS" in the last 72 hours. BNP: No results for input(s): "BNP" in the last 72 hours.  D-Dimer: No results for input(s): "DDIMER" in the last 72 hours. Hemoglobin A1C: No results for input(s): "HGBA1C" in the last 72 hours. Fasting Lipid Panel: No results for input(s): "CHOL", "HDL", "LDLCALC", "TRIG", "CHOLHDL", "LDLDIRECT" in the last 72 hours. Thyroid Function Tests: Recent Labs    03/06/23 0258  TSH 0.532    Anemia Panel: No results for input(s): "VITAMINB12", "FOLATE", "FERRITIN", "TIBC", "IRON", "RETICCTPCT" in the last 72 hours.   Radiology: [place] Chest Port 1 View  Result Date: 03/07/2023 CLINICAL DATA:  Status post right thoracentesis EXAM: PORTABLE CHEST 1 VIEW COMPARISON:  Chest radiograph dated 03/07/2023 FINDINGS: Lines/tubes: Partially imaged inferior approach catheter projects over the left upper quadrant. Chest: Improved lung aeration with persistent left-greater-than-right interstitial and patchy opacities. Pleura: Decreased right pleural effusion. Persistent moderate left pleural effusion. No pneumothorax. Heart/mediastinum: Similar enlarged cardiomediastinal silhouette. Bones: No acute osseous abnormality. IMPRESSION: 1. Decreased right pleural effusion status post thoracentesis.  No pneumothorax. 2. Persistent moderate left pleural effusion. 3. Improved lung aeration with persistent left-greater-than-right interstitial and patchy opacities. Electronically Signed   By: Agustin Cree M.D.   On: 03/07/2023 16:35   ECHOCARDIOGRAM LIMITED  Result Date: 03/07/2023    ECHOCARDIOGRAM LIMITED REPORT   Patient Name:   Cody [name]    Date of Exam: 03/07/2023 Medical Rec #:  [id number]  Height:       60.0 in Accession #:    [id number] Weight:       107.8 lb Date of Birth:  1946/04/10 BSA:          1.435 m Patient Age:    76 years   BP:           92/60 mmHg Patient Gender: M          HR:           87 bpm. Exam Location:  [facility] Procedure: Limited Echo, Color Doppler and Cardiac Doppler Indications:     Tamponade I31.4  History:         Patient has prior history of Echocardiogram examinations, most                  recent 03/06/2023. CHF; Risk Factors:Hypertension. Tobacco abuse.  Sonographer:     Cristela Blue Referring Phys:  [id number] CARALYN HUDSON Diagnosing Phys: Marcina Millard MD IMPRESSIONS  1. Left ventricular ejection fraction, by estimation, is 60 to 65%. The left ventricle has normal function. The left ventricle has no regional wall motion abnormalities. Indeterminate diastolic filling due to E-A fusion.  2. Right ventricular systolic function is normal. The right ventricular size is normal.  3. Moderate pericardial effusion.  4. The mitral valve is normal in structure. Mild mitral valve regurgitation. No evidence of mitral stenosis.  5. The aortic valve is normal in structure. Aortic valve regurgitation is mild. No aortic stenosis is present.  6. The inferior vena cava is normal in size with greater than 50% respiratory variability, suggesting right atrial pressure of 3 mmHg. FINDINGS  Left Ventricle: Left ventricular ejection fraction, by estimation, is  60 to 65%. The left ventricle has normal function. The left ventricle has no regional wall motion abnormalities. The left ventricular internal cavity  size was normal in size. There is  no left ventricular hypertrophy. Indeterminate diastolic filling due to E-A fusion. Right Ventricle: The right ventricular size is normal. No increase in right ventricular wall thickness. Right ventricular systolic function is normal. Left Atrium: Left atrial size was normal in size. Right Atrium: Right atrial size was normal in size. Pericardium: Pericardial effusion much reduced after pericardiocenetesis. No right atrial or right ventricular diastolic collapse. No evidence for preicardial tamonade. A moderately sized pericardial effusion is present. Mitral Valve: The mitral valve is normal in structure. Mild mitral valve regurgitation. No evidence of mitral valve stenosis. Tricuspid Valve: The tricuspid valve is normal in structure. Tricuspid valve regurgitation is mild . No evidence of tricuspid stenosis. Aortic Valve: The aortic valve is normal in structure. Aortic valve regurgitation is mild. Aortic regurgitation PHT measures 436 msec. No aortic stenosis is present. Pulmonic Valve: The pulmonic valve was normal in structure. Pulmonic valve regurgitation is not visualized. No evidence of pulmonic stenosis. Aorta: The aortic root is normal in size and structure. Venous: The inferior vena cava is normal in size with greater than 50% respiratory variability, suggesting right atrial pressure of 3 mmHg. IAS/Shunts: No atrial level shunt detected by color flow Doppler. Additional Comments: Spectral Doppler performed. Color Doppler performed.  LEFT VENTRICLE PLAX 2D LVIDd:         4.40 cm LVIDs:         2.80 cm LV PW:         0.90 cm LV IVS:        0.80 cm LVOT diam:     2.00 cm LVOT Area:     3.14 cm  LEFT ATRIUM         Index LA diam:    2.80 cm 1.95 cm/m  AORTIC VALVE AI PHT:      436 msec  AORTA Ao Root diam: 3.00 cm MITRAL VALVE                TRICUSPID VALVE MV Area (PHT): 4.99 cm     TR Peak grad:   24.0 mmHg MV Decel Time: 152 msec     TR Vmax:        245.00 cm/s MV E  velocity: 126.00 cm/s                             SHUNTS                             Systemic Diam: 2.00 cm Marcina Millard MD Electronically signed by Marcina Millard MD Signature Date/Time: 03/07/2023/1:09:22 PM    Final    CT HEAD WO CONTRAST ( )  Result Date: 03/07/2023 CLINICAL DATA:  Neuro deficit, acute, stroke suspected. Altered mental status. Memory loss. EXAM: CT HEAD WITHOUT CONTRAST TECHNIQUE: Contiguous axial images were obtained from the base of the skull through the vertex without intravenous contrast. RADIATION DOSE REDUCTION: This exam was performed according to the departmental dose-optimization program which includes automated exposure control, adjustment of the mA and/or kV according to patient size and/or use of iterative reconstruction technique. COMPARISON:  Head CT and MRI 03/05/2023 FINDINGS: Brain: There is no evidence of an acute cortically based infarct, intracranial hemorrhage, midline shift, or extra-axial fluid collection. The ventricles are  normal in size. Mild-to-moderate vasogenic edema in the right frontal lobe, left occipital lobe, and right cerebellar hemisphere is unchanged and without significant associated mass effect. Mild to moderate hypodensities elsewhere in the cerebral white matter bilaterally are nonspecific but compatible with chronic small vessel ischemic disease. 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 Vascular: Calcified atherosclerosis at the skull base. No hyperdense vessel. Skull: No acute fracture or suspicious osseous lesion. Sinuses/Orbits: Minimal mucosal thickening in the paranasal sinuses. Clear mastoid air cells. Unremarkable orbits. Other: None. IMPRESSION: 1. Unchanged edema associated with known lesions in the right frontal lobe, left occipital lobe, and right cerebellum. No significant mass effect. 2. No evidence of a new intracranial abnormality. Electronically Signed   By: Sebastian Ache M.D.   On: 03/07/2023 11:23   DG  Chest Port 1 View  Result Date: 03/07/2023 CLINICAL DATA:  Pleural effusion. EXAM: PORTABLE CHEST 1 VIEW COMPARISON:  Chest radiograph and CT 03/05/2023 FINDINGS: The cardiac silhouette remains enlarged. Aortic atherosclerosis is noted. Veiling opacities in the lower lungs are consistent with persistent small to moderate pleural effusions as shown on CT. Bilateral interstitial and patchy airspace opacities have slightly worsened. No pneumothorax is identified. IMPRESSION: 1. Slight worsening of bilateral lung opacities which may reflect edema or pneumonia. 2. Persistent bilateral pleural effusions. Electronically Signed   By: Sebastian Ache M.D.   On: 03/07/2023 09:50   US Venous Img Lower Bilateral (DVT)  Result Date: 03/06/2023 CLINICAL DATA:  Lower extremity edema EXAM: BILATERAL LOWER EXTREMITY VENOUS DOPPLER ULTRASOUND TECHNIQUE: Gray-scale sonography with graded compression, as well as color Doppler and duplex ultrasound were performed to evaluate the lower extremity deep venous systems from the level of the common femoral vein and including the common femoral, femoral, profunda femoral, popliteal and calf veins including the posterior tibial, peroneal and gastrocnemius veins when visible. The superficial great saphenous vein was also interrogated. Spectral Doppler was utilized to evaluate flow at rest and with distal augmentation maneuvers in the common femoral, femoral and popliteal veins. COMPARISON:  None Available. FINDINGS: RIGHT LOWER EXTREMITY Common Femoral Vein: No evidence of thrombus. Normal compressibility, respiratory phasicity and response to augmentation. Saphenofemoral Junction: No evidence of thrombus. Normal compressibility and flow on color Doppler imaging. Profunda Femoral Vein: No evidence of thrombus. Normal compressibility and flow on color Doppler imaging. Femoral Vein: No evidence of thrombus. Normal compressibility, respiratory phasicity and response to augmentation. Popliteal  Vein: No evidence of thrombus. Normal compressibility, respiratory phasicity and response to augmentation. Calf Veins: No evidence of thrombus. Normal compressibility and flow on color Doppler imaging. Superficial Great Saphenous Vein: No evidence of thrombus. Normal compressibility. Venous Reflux:  None. Other Findings:  None. LEFT LOWER EXTREMITY Common Femoral Vein: No evidence of thrombus. Normal compressibility, respiratory phasicity and response to augmentation. Saphenofemoral Junction: No evidence of thrombus. Normal compressibility and flow on color Doppler imaging. Profunda Femoral Vein: No evidence of thrombus. Normal compressibility and flow on color Doppler imaging. Femoral Vein: No evidence of thrombus. Normal compressibility, respiratory phasicity and response to augmentation. Popliteal Vein: No evidence of thrombus. Normal compressibility, respiratory phasicity and response to augmentation. Calf Veins: No evidence of thrombus. Normal compressibility and flow on color Doppler imaging. Superficial Great Saphenous Vein: No evidence of thrombus. Normal compressibility. Venous Reflux:  None. Other Findings:  None. IMPRESSION: No evidence of deep venous thrombosis in either lower extremity. Electronically Signed   By: Alcide Clever M.D.   On: 03/06/2023 19:43   ECHOCARDIOGRAM COMPLETE  Result Date:  03/06/2023    ECHOCARDIOGRAM REPORT   Patient Name:  Cody [name]    Date of      03/06/2023                           Exam: Medical Rec #: [id number]  Height:      60.0 in Accession #:   [id number] Weight:      99.2 lb Date of Birth: 11/25/1945 BSA:         1.386 m Patient Age:   76 years   BP:          Not listed in chart/Not listed in chart                                        mmHg Patient        M          HR:          Not listed in chart bpm. Gender: Exam Location: [facility] Procedure: 2D Echo, Cardiac Doppler and Color Doppler Indications:     Tamponade I31.4  History:         Patient has prior history of  Echocardiogram examinations, most                  recent 03/05/2023. CHF; Risk Factors:Hypertension. Tobacco abuse.  Sonographer:     Cristela Blue Referring Phys:  [id number] [facility] MICHELLE Alastair Hennes Diagnosing Phys: Marcina Millard MD IMPRESSIONS  1. Left ventricular ejection fraction, by estimation, is 60 to 65%. The left ventricle has normal function. The left ventricle has no regional wall motion abnormalities. Left ventricular diastolic parameters are indeterminate.  2. Right ventricular systolic function is normal. The right ventricular size is normal.  3. Successful pericardiocentesis with removal of 1100 cc serosanguinous pericardial fluid.. Findings are consistent with cardiac tamponade.  4. The mitral valve is normal in structure. No evidence of mitral valve regurgitation. No evidence of mitral stenosis.  5. The aortic valve is normal in structure. Aortic valve regurgitation is not visualized. No aortic stenosis is present.  6. The inferior vena cava is normal in size with greater than 50% respiratory variability, suggesting right atrial pressure of 3 mmHg. FINDINGS  Left Ventricle: Left ventricular ejection fraction, by estimation, is 60 to 65%. The left ventricle has normal function. The left ventricle has no regional wall motion abnormalities. The left ventricular internal cavity size was normal in size. There is  no left ventricular hypertrophy. Left ventricular diastolic parameters are indeterminate. Right Ventricle: The right ventricular size is normal. No increase in right ventricular wall thickness. Right ventricular systolic function is normal. Left Atrium: Left atrial size was normal in size. Right Atrium: Right atrial size was normal in size. Pericardium: Successful pericardiocentesis with removal of 1100 cc serosanguinous pericardial fluid. There is no evidence of pericardial effusion. The pericardial effusion is circumferential. There is diastolic collapse of the right ventricular free wall. There is  evidence of cardiac tamponade. Mitral Valve: The mitral valve is normal in structure. No evidence of mitral valve regurgitation. No evidence of mitral valve stenosis. Tricuspid Valve: The tricuspid valve is normal in structure. Tricuspid valve regurgitation is not demonstrated. No evidence of tricuspid stenosis. Aortic Valve: The aortic valve is normal in structure. Aortic valve regurgitation is not visualized. No aortic stenosis is present. Pulmonic Valve: The pulmonic valve was normal  in structure. Pulmonic valve regurgitation is not visualized. No evidence of pulmonic stenosis. Aorta: The aortic root is normal in size and structure. Venous: The inferior vena cava is normal in size with greater than 50% respiratory variability, suggesting right atrial pressure of 3 mmHg. IAS/Shunts: No atrial level shunt detected by color flow Doppler.  LEFT VENTRICLE PLAX 2D LVIDd:         3.85 cm LVIDs:         3.10 cm LV PW:         0.75 cm LV IVS:        1.10 cm  RIGHT VENTRICLE RV Basal diam:  3.40 cm RV Mid diam:    2.50 cm LEFT ATRIUM         Index       RIGHT ATRIUM           Index LA diam:    4.40 cm 3.18 cm/m  RA Area:     12.50 cm                                 RA Volume:   27.90 ml  20.14 ml/m Marcina Millard MD Electronically signed by Marcina Millard MD Signature Date/Time: 03/06/2023/5:12:27 PM    Final    CARDIAC CATHETERIZATION  Result Date: 03/06/2023 Successful pericardiocentesis with placement of pigtail catheter   ECHOCARDIOGRAM COMPLETE  Result Date: 03/06/2023    ECHOCARDIOGRAM REPORT   Patient Name:   Cody [name]    Date of Exam: 03/05/2023 Medical Rec #:  [id number]  Height:       57.0 in Accession #:    [id number] Weight:       100.0 lb Date of Birth:  [birth year] BSA:          1.340 m Patient Age:    76 years   BP:           123/76 mmHg Patient Gender: M          HR:           74 bpm. Exam Location:  [facility] Procedure: 2D Echo, Cardiac Doppler and Color Doppler Indications:     I31.3 Pericardial  Effusion  History:         Patient has prior history of Echocardiogram examinations, most                  recent 07/27/2018. CHF; Risk Factors:Hypertension, Current                  Smoker and Alcohol Abuse.  Sonographer:     Daphine Deutscher RDCS Referring Phys:  [id number] Verdene Lennert Diagnosing Phys: Yvonne Kendall MD IMPRESSIONS  1. Left ventricular ejection fraction, by estimation, is 55 to 60%. The left ventricle has normal function. The left ventricle has no regional wall motion abnormalities. Left ventricular diastolic parameters are consistent with Grade I diastolic dysfunction (impaired relaxation).  2. Right ventricular systolic function is mildly reduced. The right ventricular size is normal. There is normal pulmonary artery systolic pressure.  3. Large pericardial effusion. The pericardial effusion is circumferential. Findings are consistent with cardiac tamponade.  4. The mitral valve is normal in structure. Mild mitral valve regurgitation.  5. Tricuspid valve regurgitation is mild to moderate.  6. The aortic valve is tricuspid. There is mild calcification of the aortic valve. There is mild thickening of the aortic valve. Aortic valve regurgitation is mild to moderate.  7. The inferior vena cava is dilated in size with <50% respiratory variability, suggesting right atrial pressure of 15 mmHg. Conclusion(s)/Recommendation(s): Findings were conveyed to Dr. Gillis Santa at 8:02 AM on 03/06/2023 by Dr. Cristal [place]. FINDINGS  Left Ventricle: Left ventricular ejection fraction, by estimation, is 55 to 60%. The left ventricle has normal function. The left ventricle has no regional wall motion abnormalities. The left ventricular internal cavity size was normal in size. There is  no left ventricular hypertrophy. Left ventricular diastolic parameters are consistent with Grade I diastolic dysfunction (impaired relaxation). Right Ventricle: The right ventricular size is normal. No increase in right  ventricular wall thickness. Right ventricular systolic function is mildly reduced. There is normal pulmonary artery systolic pressure. The tricuspid regurgitant velocity is 1.71 m/s, and with an assumed right atrial pressure of 15 mmHg, the estimated right ventricular systolic pressure is 26.7 mmHg. Left Atrium: Left atrial size was normal in size. Right Atrium: Right atrial size was normal in size. Pericardium: A large pericardial effusion is present. The pericardial effusion is circumferential. The pericardial effusion appears to contain fibrous material. There is diastolic collapse of the right ventricular free wall and excessive respiratory variation in the mitral valve spectral Doppler velocities. There is evidence of cardiac tamponade. Mitral Valve: The mitral valve is normal in structure. There is mild thickening of the mitral valve leaflet(s). Mild mitral valve regurgitation. Tricuspid Valve: The tricuspid valve is normal in structure. Tricuspid valve regurgitation is mild to moderate. Aortic Valve: The aortic valve is tricuspid. There is mild calcification of the aortic valve. There is mild thickening of the aortic valve. Aortic valve regurgitation is mild to moderate. Aortic regurgitation PHT measures 507 msec. Aortic valve mean gradient measures 2.5 mmHg. Aortic valve peak gradient measures 4.3 mmHg. Aortic valve area, by VTI measures 2.84 cm. Pulmonic Valve: The pulmonic valve was normal in structure. Pulmonic valve regurgitation is trivial. No evidence of pulmonic stenosis. Aorta: The aortic root and ascending aorta are structurally normal, with no evidence of dilitation. Pulmonary Artery: The pulmonary artery is of normal size. Venous: The inferior vena cava is dilated in size with less than 50% respiratory variability, suggesting right atrial pressure of 15 mmHg. IAS/Shunts: The interatrial septum was not well visualized.  LEFT VENTRICLE PLAX 2D LVIDd:         3.50 cm   Diastology LVIDs:         2.50  cm   LV e' medial:    4.46 cm/s LV PW:         0.90 cm   LV E/e' medial:  9.7 LV IVS:        0.80 cm   LV e' lateral:   6.31 cm/s LVOT diam:     2.00 cm   LV E/e' lateral: 6.9 LV SV:         46 LV SV Index:   34 LVOT Area:     3.14 cm  RIGHT VENTRICLE RV Basal diam:  3.10 cm RV S prime:     7.29 cm/s TAPSE (M-mode): 1.3 cm LEFT ATRIUM             Index        RIGHT ATRIUM           Index LA diam:        3.10 cm 2.31 cm/m   RA Area:     10.70 cm LA Vol (A2C):   37.7 ml 28.14 ml/m  RA Volume:   27.40  ml  20.45 ml/m LA Vol (A4C):   25.3 ml 18.88 ml/m LA Biplane Vol: 33.9 ml 25.30 ml/m  AORTIC VALVE AV Area (Vmax):    2.86 cm AV Area (Vmean):   2.60 cm AV Area (VTI):     2.84 cm AV Vmax:           103.69 cm/s AV Vmean:          74.930 cm/s AV VTI:            0.161 m AV Peak Grad:      4.3 mmHg AV Mean Grad:      2.5 mmHg LVOT Vmax:         94.33 cm/s LVOT Vmean:        62.000 cm/s LVOT VTI:          0.145 m LVOT/AV VTI ratio: 0.90 AI PHT:            507 msec  AORTA Ao Root diam: 3.20 cm Ao Asc diam:  3.50 cm MITRAL VALVE               TRICUSPID VALVE MV Area (PHT): 4.28 cm    TR Peak grad:   11.7 mmHg MV Decel Time: 177 msec    TR Vmax:        171.00 cm/s MV E velocity: 43.23 cm/s MV A velocity: 63.73 cm/s  SHUNTS MV E/A ratio:  0.68        Systemic VTI:  0.15 m                            Systemic Diam: 2.00 cm Yvonne Kendall MD Electronically signed by Yvonne Kendall MD Signature Date/Time: 03/06/2023/8:13:55 AM    Final    MR Brain W and Wo Contrast  Result Date: 03/05/2023 CLINICAL DATA:  Mental status change, unknown cause EXAM: MRI HEAD WITHOUT AND WITH CONTRAST TECHNIQUE: Multiplanar, multiecho pulse sequences of the brain and surrounding structures were obtained without and with intravenous contrast. CONTRAST:  5mL GADAVIST GADOBUTROL 1 MMOL/ML IV SOLN COMPARISON:  Same day CT head FINDINGS: Brain: Negative for an acute infarct. No hydrocephalus. No extra-axial fluid collection. Small  microhemorrhage in the right lentiform nucleus. Chronic left cerebellar infarct. There is sequela mild-to-moderate chronic microvascular ischemic change. There are multiple lesions, including- -1.0 x 0.7 cm contrast-enhancing lesion in the right frontal lobe with surrounding vasogenic edema (series 18, image 108) - 0.8 x 0.7 cm contrast-enhancing lesions in in the left occipital lobe with surrounding vasogenic edema (series 18, image 69). -1.2 x 1.1 cm contrast-enhancing lesion in the superior right cerebellum mild surrounding vasogenic edema (series 18, image 50) -0.6 x 0.4 cm dural-based contrast-enhancing lesion along the left frontal convexity (series 93, image 176). Vascular: Normal flow voids. Skull and upper cervical spine: Normal marrow signal. Sinuses/Orbits: No middle ear or mastoid effusion. Mild mucosal thickening bilateral ethmoid air cells. Orbits are unremarkable. Other: None. IMPRESSION: 1. Multiple contrast-enhancing lesions in the right frontal lobe, left occipital lobe, and superior right cerebellum with surrounding vasogenic edema. 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 2. Small dural-based contrast-enhancing lesion along the left frontal convexity could represent a small meningimoa, but metastatic disease is not excluded. Electronically Signed   By: Lorenza Cambridge M.D.   On: 03/05/2023 16:48   CT CHEST ABDOMEN PELVIS W CONTRAST  Result Date: 03/05/2023 CLINICAL DATA:  Brain mass. Assess for occult malignancy. * Tracking Code: BO *  EXAM: CT CHEST, ABDOMEN, AND PELVIS WITH CONTRAST TECHNIQUE: Multidetector CT imaging of the chest, abdomen and pelvis was performed following the standard protocol during bolus administration of intravenous contrast. RADIATION DOSE REDUCTION: This exam was performed according to the departmental dose-optimization program which includes automated exposure control, adjustment of the mA and/or kV according to patient size and/or use of iterative  reconstruction technique. CONTRAST:  75mL OMNIPAQUE IOHEXOL 300 MG/ML  SOLN COMPARISON:  Chest CT without contrast 04/28/2015. X-ray chest earlier 03/05/2023 FINDINGS: CT CHEST FINDINGS Cardiovascular: Very large pericardial effusion. The heart itself is nonenlarged. Significant coronary artery calcifications. The thoracic aorta has a normal course and caliber with mild atherosclerotic calcified plaque. Mediastinum/Nodes: Normal caliber thoracic esophagus. Preserved thyroid gland. No specific abnormal lymph node enlargement seen including in the axillary regions, hilum. Prominent right paratracheal node on series 2, image 18 measures 18 by 7 mm. This could be 2 adjacent nodes. Subcarinal node series 2, image 29 measures 2.7 by 1.1 cm, mildly enlarged. Lungs/Pleura: Moderate right and small left effusion. There are some loculated components on the left with fluid tracking along the interlobar fissure. Adjacent parenchymal opacities. Atelectasis versus infiltrate. There is a opacity specifically in the lingula as well. Scattered ground-glass elsewhere in the visualized lungs. No pneumothorax. No obvious mass. Significant breathing motion throughout the examination. A subtle mass lesion could be obscured by the lung opacity in the effusions. Follow up study could be performed after improvement of the effusions. Musculoskeletal: Mild degenerative changes along the spine. CT ABDOMEN PELVIS FINDINGS Hepatobiliary: Contrast exam in the abdomen is more in the late arterial phase and portal venous phase. Patent portal vein. Gallbladder is present. Small cysts seen in segment 4 of the liver. Pancreas: Unremarkable. No pancreatic ductal dilatation or surrounding inflammatory changes. Spleen: Normal in size without focal abnormality. Adrenals/Urinary Tract: Slight nonspecific thickening of the adrenal glands but unchanged from previous. No enhancing renal mass or collecting system dilatation. There are some small  low-attenuation lesions in the left kidney which are too small to completely characterize but likely benign cysts, Bosniak 2 lesions. No specific imaging follow-up. The ureters have normal course and caliber extending down to the bladder. Diffuse wall thickening of the urinary bladder. Stomach/Bowel: On this non oral contrast exam, large bowel has a normal caliber. Redundant course of the sigmoid colon. There are some areas of mild wall thickening along loops of bowel, nonspecific. The stomach is mildly distended with fluid. Mild fold thickening suggested small bowel is nondilated. Vascular/Lymphatic: Normal caliber aorta and IVC with moderate calcified plaque along the aorta and iliac vessels. There are several enlarged nodes identified in the retroperitoneum and upper abdomen. Example aortocaval node posteriorly on series 2, image 64 measures 17 by 13 mm. No next of the celiac series 2, image 59 measures 16 by 14 mm. Left para-node anteriorly on series 2, image 68 measures 17 by 8 mm. A few small retrocrural nodes are also identified. Reproductive: Enlarged heterogeneous prostate. Please correlate with patient's PSA. Other: Anasarca.  Mesenteric haziness.  Trace ascites.  No free air Musculoskeletal: Mild degenerative changes along the spine. IMPRESSION: Large pericardial effusion. Please correlate with and new symptoms including for tamponade. Moderate right and small left effusion with some loculated components on the left. Adjacent parenchymal opacities. Atelectasis versus infiltrate. Recommend follow up after clearance of the effusions. Few borderline enlarged lymph nodes identified including mediastinum, upper retroperitoneum. Enlarged prostate with mass effect along the bladder with bladder wall thickening. Please correlate with patient's PSA.  No bowel obstruction or free air. There is scattered mesenteric haziness with trace ascites. There are some small areas of wall thickening along the colon but this  could simply relate to the other signs of edema. Electronically Signed   By: Karen Kays M.D.   On: 03/05/2023 15:03   DG Chest Port 1 View  Result Date: 03/05/2023 CLINICAL DATA:  Shortness of breath EXAM: PORTABLE CHEST 1 VIEW COMPARISON:  07/27/2018 FINDINGS: Cardiac silhouette is enlarged with diffuse interstitial opacities throughout both lungs compatible with edema related to CHF. Basilar atelectasis noted, worse on the left. Difficult to exclude small effusions. Also left lower lobe retrocardiac opacity obscures the left hemidiaphragm concerning for possible superimposed pneumonia. No pneumothorax. Trachea midline. Aorta atherosclerotic. No acute osseous finding. IMPRESSION: 1. Cardiomegaly with diffuse interstitial edema pattern. 2. Left lower lobe airspace consolidation concerning for superimposed pneumonia. 3. Suspect small effusions. Electronically Signed   By: Judie Petit.  Shick M.D.   On: 03/05/2023 13:20   CT Head Wo Contrast  Result Date: 03/05/2023 CLINICAL DATA:  Mental status change of unknown cause. Memory disturbance. EXAM: CT HEAD WITHOUT CONTRAST TECHNIQUE: Contiguous axial images were obtained from the base of the skull through the vertex without intravenous contrast. RADIATION DOSE REDUCTION: This exam was performed according to the departmental dose-optimization program which includes automated exposure control, adjustment of the mA and/or kV according to patient size and/or use of iterative reconstruction technique. COMPARISON:  07/27/2018 FINDINGS: Brain: No focal abnormality affects the brainstem. Question mild edema and mass effect in the inferior right cerebellum. 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 There is abnormal vasogenic edema within the right posterior frontal region worrisome for an underlying mass lesion. In the left hemisphere, there is abnormal vasogenic edema in the occipital lobe worrisome for the presence of an occult mass lesion. No visible hemorrhage.  Hydrocephalus or extra-axial collection. Vascular: There is atherosclerotic calcification of the major vessels at the base of the brain. Skull: No calvarial abnormality. Sinuses/Orbits: Clear/normal Other: None IMPRESSION: 1. Abnormal vasogenic edema in the right posterior frontal region and in the left occipital lobe worrisome for the presence of occult mass lesions. Question mild edema and mass effect in the inferior right cerebellum. No visible hemorrhage. MRI with and without contrast is recommended for further evaluation. 2. 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 3. Atherosclerotic calcification of the major vessels at the base of the brain. Electronically Signed   By: Paulina Fusi M.D.   On: 03/05/2023 12:52     TELEMETRY reviewed by me (LT) 03/08/2023 : AF rate 50s-70s  EKG reviewed by me: Sinus rhythm with low voltage  Data reviewed by me (LT) 03/08/2023: PCCM note, nephrology note, neurosurgery note, hospitalist progress note last 24h vitals tele labs imaging I/O   Principal Problem:   Acute encephalopathy Active Problems:   Congestive heart failure (HCC)   Chronic obstructive pulmonary disease (HCC)   Acute hyponatremia   Pericardial effusion   Vasogenic edema (HCC)   Brain mass   Pleural effusion, bilateral   Alcohol use disorder   BPH (benign prostatic hyperplasia)   Elevated LFTs   Pneumonia of left lower lobe due to infectious organism   Metastasis to brain [facility])    ASSESSMENT AND PLAN:  Cody [name] is a 77yo [place] male with a PMH of HFpEF, alcohol use disorder, ongoing tobacco abuse, HTN who presented to [facility] ED 03/05/2023 with altered mental status (speaking nonsensically and having difficulty with his memory), generalized weakness,  shortness of breath, and left-sided chest discomfort.  He was admitted to stepdown and head CT obtained showed abnormal vasogenic edema in the right posterior frontal region and left occipital lobe worrisome for mass lesions.  CT chest  abdomen pelvis revealed a large pericardial effusion, confirmed by echo this morning to be a large circumferential pericardial effusion with findings consistent with cardiac tamponade.  # Altered mental status # Vasogenic edema with c/f brain metastasis Presents with 1 week of nonsensical speech and memory difficulty per family, head CT and brain MRI concerning for vasogenic edema and brain mets with unknown primary although, CT chest abdomen pelvis suspicious for prostate enlargement.  Started on steroids and 3% normal saline per nephrology.  Agree with current therapy per primary, oncology, and nephrology. -Cytology pending  # Large pericardial effusion with C/F cardiac tamponade # S/p urgent pericardiocentesis 5/8 Initial CT chest abdomen pelvis showed a large pericardial effusion, with echo confirming large circumferential pericardial effusion with findings concerning for cardiac tamponade.  S/p urgent pericardiocentesis with 1.1 L of bloody fluid aspirate and pigtail catheter left in place.  Repeat limited echo showed much improvement in the pericardial effusion to a moderate-sized effusion today.  Dr. Darrold Junker aspirated an additional ~200 cc of bloody fluid at the bedside on 5/9, and an additional 50 cc today.   -Pigtail catheter removed at the bedside this morning, keep dry gauze and Tegaderm dressing in place -If recurrence in pericardial fluid, he may need to be transferred to a tertiary facility for a pericardial window, if this remains within the family and patient's goals of care. -Cytology pending.  # AF with slow ventricular response Developed following pericardiocentesis, controlled ventricular rate on telemetry.  Cody [name] not recommend heparin with bloody fluid aspirate.    # Alcohol use disorder Per family, the patient drinks 6-7 beers per day and had not had alcohol 2 days prior to admission because he was feeling poorly.  CIWA protocol per primary.  Complete cessation recommended  #  Tobacco abuse Complete cessation advised.  This patient's plan of care was discussed and created with Dr. Darrold Junker and he is in agreement.  Signed: Rebeca Allegra , PA-C 03/08/2023, 3:00 PM [facility] Cardiology

## 2023-03-09 DIAGNOSIS — G9340 Encephalopathy, unspecified: Secondary | ICD-10-CM | POA: Diagnosis not present

## 2023-03-09 LAB — IRON AND TIBC
Iron: 41 ug/dL — ABNORMAL LOW (ref 45–182)
Saturation Ratios: 17 % — ABNORMAL LOW (ref 17.9–39.5)
TIBC: 239 ug/dL — ABNORMAL LOW (ref 250–450)
UIBC: 198 ug/dL

## 2023-03-09 LAB — HEPATIC FUNCTION PANEL
ALT: 117 U/L — ABNORMAL HIGH (ref 0–44)
AST: 75 U/L — ABNORMAL HIGH (ref 15–41)
Albumin: 2.2 g/dL — ABNORMAL LOW (ref 3.5–5.0)
Alkaline Phosphatase: 63 U/L (ref 38–126)
Bilirubin, Direct: 0.1 mg/dL (ref 0.0–0.2)
Indirect Bilirubin: 0.6 mg/dL (ref 0.3–0.9)
Total Bilirubin: 0.7 mg/dL (ref 0.3–1.2)
Total Protein: 5.6 g/dL — ABNORMAL LOW (ref 6.5–8.1)

## 2023-03-09 LAB — PHOSPHORUS: Phosphorus: 3.5 mg/dL (ref 2.5–4.6)

## 2023-03-09 LAB — CULTURE, BLOOD (ROUTINE X 2)
Culture: NO GROWTH
Culture: NO GROWTH

## 2023-03-09 LAB — BASIC METABOLIC PANEL
Anion gap: 4 — ABNORMAL LOW (ref 5–15)
BUN: 20 mg/dL (ref 8–23)
CO2: 22 mmol/L (ref 22–32)
Calcium: 8.1 mg/dL — ABNORMAL LOW (ref 8.9–10.3)
Chloride: 107 mmol/L (ref 98–111)
Creatinine, Ser: 0.77 mg/dL (ref 0.61–1.24)
GFR, Estimated: >60 mL/min (ref >60)
Glucose, Bld: 129 mg/dL — ABNORMAL HIGH (ref 70–99)
Potassium: 3.9 mmol/L (ref 3.5–5.1)
Sodium: 133 mmol/L — ABNORMAL LOW (ref 135–145)

## 2023-03-09 LAB — CBC
HCT: 32.4 % — ABNORMAL LOW (ref 39.0–52.0)
Hemoglobin: 10.4 g/dL — ABNORMAL LOW (ref 13.0–17.0)
MCH: 30.8 pg (ref 26.0–34.0)
MCHC: 32.1 g/dL (ref 30.0–36.0)
MCV: 95.9 fL (ref 80.0–100.0)
Platelets: 281 10*3/uL (ref 150–400)
RBC: 3.38 MIL/uL — ABNORMAL LOW (ref 4.22–5.81)
RDW: 13.8 % (ref 11.5–15.5)
WBC: 11.5 10*3/uL — ABNORMAL HIGH (ref 4.0–10.5)
nRBC: 0 % (ref 0.0–0.2)

## 2023-03-09 LAB — MAGNESIUM: Magnesium: 2.2 mg/dL (ref 1.7–2.4)

## 2023-03-09 LAB — FOLATE: Folate: 11.8 ng/mL (ref >5.9)

## 2023-03-09 MED ORDER — ALUM & MAG HYDROXIDE-SIMETH 200-200-20 MG/5ML PO SUSP
30.0000 mL | ORAL | Status: DC | PRN
  Administered 2023-03-09: 30 mL via ORAL
  Filled 2023-03-09: qty 30

## 2023-03-09 NOTE — Progress Notes (Signed)
[facility] Progress Note  Patient: Cody [name]    ZOX:[id number]  DOA: 03/05/2023     Date of Service: the patient was seen and examined on 03/09/2023  Chief Complaint  Patient presents with   Memory Loss   Brief hospital course: Cody [name] is a 77 y.o. male with medical history significant of hypertension, HFpEF, alcohol use disorder, BPH, who presents to the ED due to altered mental status.  Patient's son stated that he is having shortness of breath for past 1 week productive cough.  For past 2 to 3 days patient is talking randomly which does not make any sense and having memory issues.  Patient drinks 10 beers per day and active smoker. ED w/up: VS stable Hyponatremia sodium 117, mild acidosis CO2 18, mild hyperglycemia blood glucose 154, elevated AST and ALT, leukocytosis WBC 13.2 CXR:  Cardiomegaly with diffuse interstitial edema pattern. Left lower lobe airspace consolidation concerning for superimposed pneumonia. Suspect small effusions. CT head:  old CVA and vasogenic edema MRI brain: . Multiple contrast-enhancing lesions in the right frontal lobe, left occipital lobe, and superior right cerebellum with surrounding vasogenic edema. 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 Small dural-based contrast-enhancing lesion along the left frontal convexity could represent a small meningimoa, but metastatic disease is not excluded. CT Chest/a/p: Large pericardial effusion. Please correlate with and new symptoms including for tamponade.   Moderate right and small left effusion with some loculated components on the left. Adjacent parenchymal opacities. Atelectasis versus infiltrate. Recommend follow up after clearance of the effusions. Few borderline enlarged lymph nodes identified including mediastinum, upper retroperitoneum. Enlarged prostate with mass effect along the bladder with bladder wall thickening. Please correlate with patient's PSA. No bowel obstruction or free air.  There is scattered mesenteric haziness with trace ascites. There are some small areas of wall thickening along the colon but this could simply relate to the other signs of edema.  Assessment and Plan:  # Acute metabolic encephalopathy most likely due to metastatic brain lesions and vasogenic edema Symptoms improving, currently patient is back to his baseline Patient was agitated at last night on 5/8 the patient was given lorazepam 4 mg, trazodone 50 mg, and melatonin 5 mg the patient was very sleepy in the morning on 5/9.  CT head was done did not show any acute/new findings. Pulmonary critical care was consulted, ABG was done, EEG was don.  Patient's mental status gradually improved.   # Metastatic adenocarcinoma, compatible with pulmonary adenocarcinoma, stage IV with metastatic pericardial effusion, pleural effusion, and metastasis to brain with vasogenic edema CT scan and MRI brain reviewed S/p Decadron 8 mg IV x 1 dose, s/p Decadron 4 mg p.o. daily till 5/10 5/1 Decadron 2 mg p.o. twice daily Continue Keppra 500 mg IV twice daily for seizure prophylaxis Neurosurgery consulted, recommended no intervention at this time but if there is no diagnosis then brain biopsy can be done next week. 5/9 CT Head: Unchanged edema associated with known lesions in the right frontal lobe, left occipital lobe, and right cerebellum. No significant mass effect. No evidence of a new intracranial abnormality. As per oncologist patient is not a candidate for aggressive chemotherapy, recommended radiation oncology for evaluation of radiation for brain mets.  If patient remains stable then consider immunotherapy in the outpatient setting. .-Add PD-L1 staining. If he continues to deteriorate, I think hospice comfort care is very reasonable.    # Pericardial effusion CT chest shows large pericardial effusion 2D echocardiogram showed large pericardial fusion  with possible tamponade Cardiology was consulted, s/p  pericardiocentesis 1.1 liters bloody fluid was tapped and sample was sent for cytology Pericardial catheter intact, may need drainage again tomorrow a.m. Cardiology consult appreciated Patient feels significant improvement in the shortness of breath 5/9 200 cc pericardial fluid was drained by cardiology 5/10 pigtail catheter was removed today by cardiology.   # Acute onset A-fib with controlled ventricular rate, developed after pericardiocentesis Continue monitor on telemetry Cardiology following, no need of anticoagulation at this time 5/10 started amiodarone 200 mg p.o. daily  # Bilateral pleural effusion, right >left most likely due to metastatic disease Unknown primary Patient got pericardiocentesis done today so we will postpone thoracentesis CT chest/AP reviewed as above Oncologist consulted, recommended therapeutic thoracentesis, sample for cytology cell count and other routine labs. Patient may have underlying lung cancer due to history of smoking. 5/9 CXR: Slight worsening of bilateral lung opacities which may reflect edema or pneumonia.  Persistent bilateral pleural effusions. Continue ceftriaxone and doxycycline for possible underlying pneumonia 5/9 s/p right thoracentesis 500 mL fluid was tapped by [facility] attending, fluid studies are consistent with transudative fluid.  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 Uric acid 9.7, elevated Started colchicine 0.6 mg p.o. daily Hold allopurinol for now   # CAD, HTN and chronic diastolic CHF Patient was on aspirin, Coreg, Entresto, statin, med rec shows patient is not  taking medication, seems noncompliant. Continue to monitor BP and titrate medications accordingly   # COPD, possible underlying pneumonia Continue empiric antibiotics ceftriaxone and doxycycline Continue Decadron Continue DuoNeb as needed   # Alcohol use disorder Long-term history of alcohol use disorder that is currently active.  No evidence of withdrawal on exam at this time. - CIWA protocol with Ativan as needed - TOC consultation for resources - Daily folic acid, thiamine and multivitamin  # BPH and urinary retention, Foley catheter was inserted on 5/8, 620 mL urine retention as per bladder scan Continue Flomax and finasteride  # Elevated LFTs most likely secondary to alcohol use disorder LFTs gradually improving, continue to monitor   Body mass index is 19.38 kg/m.  Interventions:       Diet: Regular diet and protein supplement  DVT Prophylaxis: SCD, pharmacological prophylaxis contraindicated due to procedure brain mets/vasogenic edema    Advance goals of care discussion: Full code  Family Communication: family was present at bedside, at the time of interview.  The pt provided permission to discuss medical plan with the family. Opportunity was given to ask question and all questions were answered satisfactorily.   Disposition:  Pt is from Home, admitted with pericardial fusion, bilateral pleural effusion, metastatic disease, brain mets with vasogenic edem, which precludes a safe discharge. Discharge to Home, when stable, may need 5 to 7 days to improve..  Subjective: No significant overnight events, patient is feeling improvement in the shortness of breath, denies any chest pain or palpitations, resting comfortably.  Did not offer any complaints.   Physical Exam: General: NAD, lying comfortably Appear in no distress Eyes: PERRLA ENT: Oral Mucosa Clear, dry Neck: no JVD,  Cardiovascular: Irregular rhythm,  no Murmur,  Respiratory: Good air entry bilaterally,  bibasilar crackles, no  wheezing appreciated.   Abdomen: Bowel Sound present, Soft and no tenderness,  Skin: no rashes Extremities: no Pedal edema, no calf tenderness Neurologic: without any new focal findings Gait not checked due to patient safety concerns  Vitals:   03/09/23 0004 03/09/23 0454 03/09/23 0734 03/09/23 1120  BP: 117/62 124/73 (!) 112/54 132/65  Pulse: 84 91 90 84  Resp: 18 17 18 18   Temp: (!) 97.4 F (36.3 C) 97.8 F (36.6 C) 97.9 F (36.6 C) 97.6 F (36.4 C)  TempSrc:  Oral  Oral  SpO2: 97% 99% 97% 97%  Weight:      Height:        Intake/Output Summary (Last 24 hours) at 03/09/2023 1516 Last data filed at 03/09/2023 1442 Gross per 24 hour  Intake 1176.79 ml  Output 700 ml  Net 476.79 ml   Filed Weights   03/06/23 0000 03/06/23 0500 03/07/23 0500  Weight: 45 kg 45 kg 48.9 kg    Data Reviewed: I have personally reviewed and interpreted daily labs, tele strips, imagings as discussed above. I reviewed all nursing notes, pharmacy notes, vitals, pertinent old records I have discussed plan of care as described above with RN and patient/family.  CBC: Recent Labs  Lab 03/05/23 1112 03/06/23 0258 03/07/23 0308 03/07/23 1628 03/08/23 0539 03/09/23 0413  WBC 13.2* 11.3* 14.2*  --  12.1* 11.5*  NEUTROABS  --  9.2*  --   --   --   --   HGB 10.9* 10.9* 10.0*  --  9.7* 10.4*  HCT 32.8* 32.2* 31.0* 32.6* 30.4* 32.4*  MCV 93.4 91.5 96.9  --  97.1 95.9  PLT 357 309 274  --  260 281   Basic Metabolic Panel: Recent Labs  Lab 03/05/23 1112 03/05/23 1656 03/06/23 0258 03/06/23 0849 03/06/23 1455 03/07/23 0308 03/07/23 0719 03/07/23 1221 03/07/23 1601 03/08/23 0539 03/09/23 0413  NA 117*   < > 124* 125*   < > 130* 129* 131* 131* 134* 133*  K 4.9  --  5.0  --   --  4.5  --   --   --  3.7 3.9  CL 87*  --  95*  --   --  102  --   --   --  107 107  CO2 18*  --  17*  --   --  18*  --   --   --  23 22  GLUCOSE 154*  --  129*  --   --  100*  --   --   --  73  129*  BUN 39*  --  42*  --   --  43*  --   --   --  28* 20  CREATININE 1.18  --  0.99  --   --  0.89  --   --   --  0.82 0.77  CALCIUM 8.5*  --  8.5*  --   --  8.0*  --   --   --  7.7* 8.1*  MG  --   --   --  2.5*  --  2.5*  --   --   --  2.3 2.2  PHOS  --   --   --  4.3  --  4.5  --   --   --  3.1 3.5   < > = values in this interval not displayed.    Studies: No results found.  Scheduled Meds:  amiodarone  200 mg Oral Daily  Chlorhexidine Gluconate Cloth  6 each Topical Daily   colchicine  0.6 mg Oral Daily   dexamethasone  2 mg Oral Q12H   feeding supplement  237 mL Oral TID BM   folic acid  1 mg Oral Daily   multivitamin with minerals  1 tablet Oral Daily   sodium chloride flush  3 mL Intravenous Q12H   sodium chloride flush  3 mL Intravenous Q12H   tamsulosin  0.4 mg Oral Daily   thiamine  100 mg Oral Daily   Or   thiamine  100 mg Intravenous Daily   Continuous Infusions:  sodium chloride 10 mL (03/08/23 2257)   sodium chloride 1 mL/kg/hr (03/08/23 1900)   cefTRIAXone (ROCEPHIN)  IV 2 g (03/09/23 1335)   doxycycline (VIBRAMYCIN) IV 100 mg (03/09/23 0943)   levETIRAcetam 500 mg (03/09/23 0626)   PRN Meds: sodium chloride, acetaminophen **OR** acetaminophen, ipratropium-albuterol, ondansetron **OR** ondansetron (ZOFRAN) IV, sodium chloride flush  Time spent: 50 minutes  Author: Gillis Santa. MD [facility] 03/09/2023 3:16 PM  To reach On-call, see care teams to locate the attending and reach out to them via [email or web address]. If 7PM-7AM, please contact night-coverage If you still have difficulty reaching the attending provider, please page the [facility] (Director on Call) for [facility] on amion for assistance.

## 2023-03-09 NOTE — Progress Notes (Addendum)
Patient arrived to room 259 from ICU.  Assessment complete, VS obtained, family at bedside.

## 2023-03-09 NOTE — Progress Notes (Signed)
[facility] Cardiology  SUBJECTIVE: Patient laying in bed, according to family members he feels much better, denies chest pain or shortness of breath   Vitals:   03/08/23 2035 03/09/23 0004 03/09/23 0454 03/09/23 0734  BP: 130/66 117/62 124/73 (!) 112/54  Pulse: 84 84 91 90  Resp: 19 18 17 18   Temp: 98.6 F (37 C) (!) 97.4 F (36.3 C) 97.8 F (36.6 C) 97.9 F (36.6 C)  TempSrc: Oral  Oral   SpO2: 100% 97% 99% 97%  Weight:      Height:         Intake/Output Summary (Last 24 hours) at 03/09/2023 0955 Last data filed at 03/09/2023 1610 Gross per 24 hour  Intake 1725.4 ml  Output 1150 ml  Net 575.4 ml      PHYSICAL EXAM  General: Well developed, well nourished, in no acute distress HEENT:  Normocephalic and atramatic Neck:  No JVD.  Lungs: Clear bilaterally to auscultation and percussion. Heart: HRRR . Normal S1 and S2 without gallops or murmurs.  Abdomen: Bowel sounds are positive, abdomen soft and non-tender  Msk:  Back normal, normal gait. Normal strength and tone for age. Extremities: No clubbing, cyanosis or edema.   Neuro: Alert and oriented X 3. Psych:  Good affect, responds appropriately   LABS: Basic Metabolic Panel: Recent Labs    03/08/23 0539 03/09/23 0413  NA 134* 133*  K 3.7 3.9  CL 107 107  CO2 23 22  GLUCOSE 73 129*  BUN 28* 20  CREATININE 0.82 0.77  CALCIUM 7.7* 8.1*  MG 2.3 2.2  PHOS 3.1 3.5   Liver Function Tests: Recent Labs    03/08/23 0539 03/09/23 0413  AST 90* 75*  ALT 122* 117*  ALKPHOS 52 63  BILITOT 0.7 0.7  PROT 5.2* 5.6*  ALBUMIN 2.2* 2.2*   No results for input(s): "LIPASE", "AMYLASE" in the last 72 hours. CBC: Recent Labs    03/08/23 0539 03/09/23 0413  WBC 12.1* 11.5*  HGB 9.7* 10.4*  HCT 30.4* 32.4*  MCV 97.1 95.9  PLT 260 281   Cardiac Enzymes: No results for input(s): "CKTOTAL", "CKMB", "CKMBINDEX", "TROPONINI" in the last 72 hours. BNP: Invalid input(s): "POCBNP" D-Dimer: No results for input(s): "DDIMER"  in the last 72 hours. Hemoglobin A1C: No results for input(s): "HGBA1C" in the last 72 hours. Fasting Lipid Panel: No results for input(s): "CHOL", "HDL", "LDLCALC", "TRIG", "CHOLHDL", "LDLDIRECT" in the last 72 hours. Thyroid Function Tests: No results for input(s): "TSH", "T4TOTAL", "T3FREE", "THYROIDAB" in the last 72 hours.  Invalid input(s): "FREET3" Anemia Panel: No results for input(s): "VITAMINB12", "FOLATE", "FERRITIN", "TIBC", "IRON", "RETICCTPCT" in the last 72 hours.  EEG adult  Result Date: 03/07/2023 Jefferson Fuel, MD     03/08/2023  3:10 PM Routine EEG Report Cody [name] is a 77 y.o. male with a history of altered mental status who is undergoing an EEG to evaluate for seizures. Report: This EEG was acquired with electrodes placed according to the International 10-20 electrode system (including Fp1, Fp2, F3, F4, C3, C4, P3, P4, O1, O2, T3, T4, T5, T6, A1, A2, Fz, Cz, Pz). The following electrodes were missing or displaced: none. The occipital dominant rhythm was 4-6 Hz. This activity is reactive to stimulation. There are runs of faster activity most prominent over the left frontal region that are favored to be arousals. Drowsiness was manifested by background fragmentation; deeper stages of sleep were identified by K complexes and sleep spindles. There iws focal slowing over the  left frontal region. There were occasional L occipital sharp waves. There were no electrographic seizures identified. Photic stimulation and hyperventilation were not performed. Impression and clinical correlation: This EEG was obtained while awake and asleep and is abnormal due to: - moderate diffuse slowing indicative of global cerebral dysfunction - left focal slowing indicative of superimposed focal cerebral dysfunction in that region - sharp waves in the left occipital region indicating increased epileptogenicity in that region No electrographic seizures were captured during this recording. Bing Neighbors, MD  [facility] [phone] If 7pm- 7am, please page neurology on call as listed in AMION.   DG Chest Port 1 View  Result Date: 03/07/2023 CLINICAL DATA:  Status post right thoracentesis EXAM: PORTABLE CHEST 1 VIEW COMPARISON:  Chest radiograph dated 03/07/2023 FINDINGS: Lines/tubes: Partially imaged inferior approach catheter projects over the left upper quadrant. Chest: Improved lung aeration with persistent left-greater-than-right interstitial and patchy opacities. Pleura: Decreased right pleural effusion. Persistent moderate left pleural effusion. No pneumothorax. Heart/mediastinum: Similar enlarged cardiomediastinal silhouette. Bones: No acute osseous abnormality. IMPRESSION: 1. Decreased right pleural effusion status post thoracentesis. No pneumothorax. 2. Persistent moderate left pleural effusion. 3. Improved lung aeration with persistent left-greater-than-right interstitial and patchy opacities. Electronically Signed   By: Agustin Cree M.D.   On: 03/07/2023 16:35   CT HEAD WO CONTRAST ( )  Result Date: 03/07/2023 CLINICAL DATA:  Neuro deficit, acute, stroke suspected. Altered mental status. Memory loss. EXAM: CT HEAD WITHOUT CONTRAST TECHNIQUE: Contiguous axial images were obtained from the base of the skull through the vertex without intravenous contrast. RADIATION DOSE REDUCTION: This exam was performed according to the departmental dose-optimization program which includes automated exposure control, adjustment of the mA and/or kV according to patient size and/or use of iterative reconstruction technique. COMPARISON:  Head CT and MRI 03/05/2023 FINDINGS: Brain: There is no evidence of an acute cortically based infarct, intracranial hemorrhage, midline shift, or extra-axial fluid collection. The ventricles are normal in size. Mild-to-moderate vasogenic edema in the right frontal lobe, left occipital lobe, and right cerebellar hemisphere is unchanged and without significant associated mass  effect. Mild to moderate hypodensities elsewhere in the cerebral white matter bilaterally are nonspecific but compatible with chronic small vessel ischemic disease. 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 Vascular: Calcified atherosclerosis at the skull base. No hyperdense vessel. Skull: No acute fracture or suspicious osseous lesion. Sinuses/Orbits: Minimal mucosal thickening in the paranasal sinuses. Clear mastoid air cells. Unremarkable orbits. Other: None. IMPRESSION: 1. Unchanged edema associated with known lesions in the right frontal lobe, left occipital lobe, and right cerebellum. No significant mass effect. 2. No evidence of a new intracranial abnormality. Electronically Signed   By: Sebastian Ache M.D.   On: 03/07/2023 11:23     Echo EF 60-65%, moderate pericardial effusion pericardial tamponade, following pericardiocentesis 03/07/2023  TELEMETRY: Sinus rhythm 88 bpm:  ASSESSMENT AND PLAN:  Principal Problem:   Acute encephalopathy Active Problems:   Congestive heart failure (HCC)   Chronic obstructive pulmonary disease (HCC)   Acute hyponatremia   Pericardial effusion   Vasogenic edema (HCC)   Brain mass   Pleural effusion, bilateral   Alcohol use disorder   BPH (benign prostatic hyperplasia)   Elevated LFTs   Pneumonia of left lower lobe due to infectious organism   Metastasis to brain [facility])   Metastatic non-small cell lung cancer (HCC)   Goals of care, counseling/discussion    1.  Large pericardial effusion, with cardiac tamponade, as post urgent pericardiocentesis 03/06/2023, with initial  removal of 1.1 L of sanguinous pericardial fluid with resolution of pericardial tamponade, and additional removal of 0.4 L over the next 24 to 48 hours, pigtail catheter removed on 03/08/2023.  Cytology positive for metastatic adenocarcinoma. 2.  Paroxysmal atrial fibrillation with slow ventricular response, currently in sinus rhythm at 88 bpm 3.  Metastatic adenocarcinoma,  likely pulmonary adenocarcinoma, with metastasis to brain, very poor prognosis, possible candidate for radiation therapy and outpatient immunotherapy versus palliative care, to be determined 4.  Alcohol abuse, patient typically drinks 6-7 beers per day, withdrawl signs and symptoms, appears resolved 5.  Tobacco abuse  Recommendations  1.  Agree with current therapy 2.  Brief relook 2D echo 03/11/2023 3.  Defer chronic anticoagulation for atrial fibrillation in light of metastatic adenocarcinoma, with brain metastasis   Marcina Millard, MD, PhD, [facility] 03/09/2023 9:55 AM

## 2023-03-10 DIAGNOSIS — G936 Cerebral edema: Secondary | ICD-10-CM | POA: Diagnosis not present

## 2023-03-10 DIAGNOSIS — E871 Hypo-osmolality and hyponatremia: Secondary | ICD-10-CM | POA: Diagnosis not present

## 2023-03-10 DIAGNOSIS — G9340 Encephalopathy, unspecified: Secondary | ICD-10-CM | POA: Diagnosis not present

## 2023-03-10 DIAGNOSIS — J189 Pneumonia, unspecified organism: Secondary | ICD-10-CM | POA: Diagnosis not present

## 2023-03-10 LAB — CBC
HCT: 32.1 % — ABNORMAL LOW (ref 39.0–52.0)
Hemoglobin: 10.2 g/dL — ABNORMAL LOW (ref 13.0–17.0)
MCH: 30.4 pg (ref 26.0–34.0)
MCHC: 31.8 g/dL (ref 30.0–36.0)
MCV: 95.8 fL (ref 80.0–100.0)
Platelets: 270 10*3/uL (ref 150–400)
RBC: 3.35 MIL/uL — ABNORMAL LOW (ref 4.22–5.81)
RDW: 13.8 % (ref 11.5–15.5)
WBC: 11.4 10*3/uL — ABNORMAL HIGH (ref 4.0–10.5)
nRBC: 0 % (ref 0.0–0.2)

## 2023-03-10 LAB — ACID FAST SMEAR (AFB, MYCOBACTERIA): Acid Fast Smear: NEGATIVE

## 2023-03-10 LAB — HEPATIC FUNCTION PANEL
ALT: 100 U/L — ABNORMAL HIGH (ref 0–44)
AST: 76 U/L — ABNORMAL HIGH (ref 15–41)
Albumin: 2.1 g/dL — ABNORMAL LOW (ref 3.5–5.0)
Alkaline Phosphatase: 59 U/L (ref 38–126)
Bilirubin, Direct: 0.1 mg/dL (ref 0.0–0.2)
Indirect Bilirubin: 0.5 mg/dL (ref 0.3–0.9)
Total Bilirubin: 0.6 mg/dL (ref 0.3–1.2)
Total Protein: 5.4 g/dL — ABNORMAL LOW (ref 6.5–8.1)

## 2023-03-10 LAB — TRIGLYCERIDES, BODY FLUIDS: Triglycerides, Fluid: 15 mg/dL

## 2023-03-10 LAB — BASIC METABOLIC PANEL
Anion gap: 4 — ABNORMAL LOW (ref 5–15)
BUN: 16 mg/dL (ref 8–23)
CO2: 23 mmol/L (ref 22–32)
Calcium: 7.8 mg/dL — ABNORMAL LOW (ref 8.9–10.3)
Chloride: 103 mmol/L (ref 98–111)
Creatinine, Ser: 0.72 mg/dL (ref 0.61–1.24)
GFR, Estimated: >60 mL/min (ref >60)
Glucose, Bld: 122 mg/dL — ABNORMAL HIGH (ref 70–99)
Potassium: 3.8 mmol/L (ref 3.5–5.1)
Sodium: 130 mmol/L — ABNORMAL LOW (ref 135–145)

## 2023-03-10 LAB — BODY FLUID CULTURE W GRAM STAIN
Culture: NO GROWTH
Gram Stain: NONE SEEN

## 2023-03-10 LAB — CULTURE, BLOOD (ROUTINE X 2)
Special Requests: ADEQUATE
Special Requests: ADEQUATE

## 2023-03-10 LAB — VITAMIN D 25 HYDROXY (VIT D DEFICIENCY, FRACTURES): Vit D, 25-Hydroxy: 16.45 ng/mL — ABNORMAL LOW (ref 30–100)

## 2023-03-10 LAB — PHOSPHORUS: Phosphorus: 3.2 mg/dL (ref 2.5–4.6)

## 2023-03-10 LAB — VITAMIN B12: Vitamin B-12: 955 pg/mL — ABNORMAL HIGH (ref 180–914)

## 2023-03-10 LAB — MAGNESIUM: Magnesium: 2.2 mg/dL (ref 1.7–2.4)

## 2023-03-10 MED ORDER — VITAMIN D (ERGOCALCIFEROL) 1.25 MG (50000 UNIT) PO CAPS
50000.0000 [IU] | ORAL_CAPSULE | ORAL | Status: DC
  Administered 2023-03-10: 50000 [IU] via ORAL
  Filled 2023-03-10: qty 1

## 2023-03-10 MED ORDER — POLYSACCHARIDE IRON COMPLEX 150 MG PO CAPS
150.0000 mg | ORAL_CAPSULE | Freq: Every day | ORAL | Status: DC
  Administered 2023-03-10 – 2023-03-12 (×3): 150 mg via ORAL
  Filled 2023-03-10 (×3): qty 1

## 2023-03-10 MED ORDER — POLYETHYLENE GLYCOL 3350 17 G PO PACK
17.0000 g | PACK | Freq: Once | ORAL | Status: AC
  Administered 2023-03-10: 17 g via ORAL
  Filled 2023-03-10: qty 1

## 2023-03-10 NOTE — Progress Notes (Signed)
Hematology/Oncology Progress note [phone] [phone]     Patient Care Team: Orson Eva, NP as PCP - General (Nurse Practitioner)   Name of the patient: Cody [name]  [id number]  July 02, 1946  Date of visit: 03/10/23   INTERVAL HISTORY-   -5/9 S/p pericardiocentesis with removal of 1.1 L of bloody fluid.  Pigtail catheter left in place.  -Cytology came back positive for malignancy.  Metastatic adenocarcinoma, compatible with pulmonary adenocarcinoma  -5/9 s/p  thoracentesis, cytology is pending.  -5/12 Daughter Larita Fife and her husband at the bedside.  Patient was transferred out of ICU. Clinically doing slightly better.  Mental status has improved   Allergies  Allergen Reactions   Shellfish Allergy Hives    Only allergic to crabs    Patient Active Problem List   Diagnosis Date Noted   Metastatic non-small cell lung cancer (HCC) 03/08/2023   Goals of care, counseling/discussion 03/08/2023   Metastasis to brain (HCC) 03/06/2023   Acute encephalopathy 03/05/2023   Acute hyponatremia 03/05/2023   Pericardial effusion 03/05/2023   Vasogenic edema (HCC) 03/05/2023   Brain mass 03/05/2023   Pleural effusion, bilateral 03/05/2023   Alcohol use disorder 03/05/2023   BPH (benign prostatic hyperplasia) 03/05/2023   Elevated LFTs 03/05/2023   Pneumonia of left lower lobe due to infectious organism 03/05/2023   Congestive heart failure (HCC) 12/24/2022   Chronic obstructive pulmonary disease (HCC) 12/24/2022   Mixed hyperlipidemia 12/24/2022   Nicotine dependence with nicotine-induced disorder 12/24/2022   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   Unstable angina (HCC) 08/13/2018   Syncope 07/27/2018     Past Medical History:  Diagnosis Date   Alcohol dependence (HCC)    CHF (congestive heart failure) (HCC)    Hypertension    Tobacco abuse      Past Surgical History:  Procedure Laterality Date   APPENDECTOMY     LEFT HEART CATH AND CORONARY  ANGIOGRAPHY N/A 08/25/2018   Procedure: LEFT HEART CATH AND CORONARY ANGIOGRAPHY;  Surgeon: Laurier Nancy, MD;  Location: [facility] INVASIVE CV LAB;  Service: Cardiovascular;  Laterality: N/A;   PERICARDIOCENTESIS N/A 03/06/2023   Procedure: PERICARDIOCENTESIS;  Surgeon: Marcina Millard, MD;  Location: [facility] INVASIVE CV LAB;  Service: Cardiovascular;  Laterality: N/A;    Social History   Socioeconomic History   Marital status: Married    Spouse name: Not on file   Number of children: Not on file   Years of education: Not on file   Highest education level: Not on file  Occupational History   Not on file  Tobacco Use   Smoking status: Every Day    Packs/day: .5    Types: Cigarettes    Passive exposure: Current   Smokeless tobacco: Never  Vaping Use   Vaping Use: Never used  Substance and Sexual Activity   Alcohol use: Yes    Alcohol/week: 35.0 standard drinks of alcohol    Types: 35 Cans of beer per week   Drug use: Never   Sexual activity: Not Currently    Birth control/protection: None  Other Topics Concern   Not on file  Social History Narrative   Not on file   Social Determinants of Health   Financial Resource Strain: Low Risk  (08/25/2018)   Overall Financial Resource Strain (CARDIA)    Difficulty of Paying Living Expenses: Not very hard  Food Insecurity: Not on file  Transportation Needs: Unknown (08/25/2018)   PRAPARE - Administrator, Civil Service (Medical): No  Lack of Transportation (Non-Medical): Not on file  Physical Activity: Not on file  Stress: No Stress Concern Present (08/25/2018)   [employer] of Occupational Health - Occupational Stress Questionnaire    Feeling of Stress : Only a little  Social Connections: Not on file  Intimate Partner Violence: Not on file     History reviewed. No pertinent family history.   Current Facility-Administered Medications:    0.9 %  sodium chloride infusion, 250 mL, Intravenous, PRN, Tang, Cheryln Manly, PA-C, Last Rate: 10 mL/hr at 03/08/23 2257, 10 mL at 03/08/23 2257   [EXPIRED] 0.9% sodium chloride infusion, 3 mL/kg/hr, Intravenous, Continuous, Stopped at 03/07/23 0400 **FOLLOWED BY** 0.9% sodium chloride infusion, 1 mL/kg/hr, Intravenous, Continuous, Tang, Cheryln Manly, PA-C, Last Rate: 45 mL/hr at 03/08/23 1900, 1 mL/kg/hr at 03/08/23 1900   acetaminophen (TYLENOL) tablet 650 mg, 650 mg, Oral, Q6H PRN **OR** acetaminophen (TYLENOL) suppository 650 mg, 650 mg, Rectal, Q6H PRN, Verdene Lennert, MD   alum & mag hydroxide-simeth (MAALOX/MYLANTA) 200-200-20 MG/5ML suspension 30 mL, 30 mL, Oral, Q4H PRN, Gillis Santa, MD, 30 mL at 03/09/23 2307   amiodarone (PACERONE) tablet 200 mg, 200 mg, Oral, Daily, Scoggins, Amber, NP, 200 mg at 03/10/23 0831   cefTRIAXone (ROCEPHIN) 2 g in sodium chloride 0.9 % 100 mL IVPB, 2 g, Intravenous, Q24H, Verdene Lennert, MD, Last Rate: 200 mL/hr at 03/09/23 1335, 2 g at 03/09/23 1335   Chlorhexidine Gluconate Cloth 2 % PADS 6 each, 6 each, Topical, Daily, Gillis Santa, MD, 6 each at 03/10/23 1610   colchicine tablet 0.6 mg, 0.6 mg, Oral, Daily, Gillis Santa, MD, 0.6 mg at 03/10/23 0831   dexamethasone (DECADRON) tablet 2 mg, 2 mg, Oral, Q12H, Gillis Santa, MD, 2 mg at 03/10/23 0831   doxycycline (VIBRAMYCIN) 100 mg in sodium chloride 0.9 % 250 mL IVPB, 100 mg, Intravenous, BID, Dgayli, Lianne Bushy, MD, Last Rate: 125 mL/hr at 03/10/23 1050, 100 mg at 03/10/23 1050   feeding supplement (ENSURE ENLIVE / ENSURE PLUS) liquid 237 mL, 237 mL, Oral, TID BM, Gillis Santa, MD, 237 mL at 03/10/23 9604   folic acid (FOLVITE) tablet 1 mg, 1 mg, Oral, Daily, Verdene Lennert, MD, 1 mg at 03/10/23 0831   ipratropium-albuterol (DUONEB) 0.5-2.5 (3) MG/3ML nebulizer solution 3 mL, 3 mL, Nebulization, Q6H PRN, Gillis Santa, MD   levETIRAcetam (KEPPRA) IVPB 500 mg/100 mL premix, 500 mg, Intravenous, Q12H, Verdene Lennert, MD, Last Rate: 400 mL/hr at 03/10/23 0553, 500 mg at  03/10/23 0553   multivitamin with minerals tablet 1 tablet, 1 tablet, Oral, Daily, Verdene Lennert, MD, 1 tablet at 03/10/23 0830   ondansetron (ZOFRAN) tablet 4 mg, 4 mg, Oral, Q6H PRN **OR** ondansetron (ZOFRAN) injection 4 mg, 4 mg, Intravenous, Q6H PRN, Verdene Lennert, MD   sodium chloride flush (NS) 0.9 % injection 3 mL, 3 mL, Intravenous, Q12H, Huel Cote, Iulia, MD, 3 mL at 03/10/23 0832   sodium chloride flush (NS) 0.9 % injection 3 mL, 3 mL, Intravenous, Q12H, Tang, Cheryln Manly, PA-C, 3 mL at 03/10/23 5409   sodium chloride flush (NS) 0.9 % injection 3 mL, 3 mL, Intravenous, PRN, Tang, Cheryln Manly, PA-C   tamsulosin (FLOMAX) capsule 0.4 mg, 0.4 mg, Oral, Daily, Verdene Lennert, MD, 0.4 mg at 03/10/23 0831   thiamine (VITAMIN B1) tablet 100 mg, 100 mg, Oral, Daily, 100 mg at 03/10/23 0831 **OR** thiamine (VITAMIN B1) injection 100 mg, 100 mg, Intravenous, Daily, Verdene Lennert, MD, 100 mg at 03/06/23 8119   Physical exam:  Vitals:   03/09/23 1659 03/09/23 2200 03/10/23 0400 03/10/23 0800  BP: 127/65 (!) 150/73 134/69 122/62  Pulse: 88 80 69 79  Resp:   15 18  Temp: 98.1 F (36.7 C) 97.8 F (36.6 C) 98 F (36.7 C) 97.8 F (36.6 C)  TempSrc:  Oral  Oral  SpO2: 99% 95% 100% 99%  Weight:      Height:       Physical Exam Constitutional:      Appearance: He is ill-appearing.  HENT:     Head: Normocephalic.  Eyes:     General: No scleral icterus. Cardiovascular:     Rate and Rhythm: Normal rate.     Pulses: Normal pulses.  Pulmonary:     Effort: Pulmonary effort is normal. No respiratory distress.     Comments: Nasal cannula oxygen Abdominal:     General: There is no distension.     Palpations: Abdomen is soft.     Tenderness: There is no abdominal tenderness.  Musculoskeletal:     Cervical back: Neck supple.  Skin:    Findings: No rash.  Neurological:     Mental Status: He is alert and oriented to person, place, and time.     Comments: More lucid today, able to  answer simple questions.         Labs    Latest Ref Rng & Units 03/10/2023    5:40 AM 03/09/2023    4:13 AM 03/08/2023    5:39 AM  CBC  WBC 4.0 - 10.5 K/uL 11.4  11.5  12.1   Hemoglobin 13.0 - 17.0 g/dL 65.7  84.6  9.7   Hematocrit 39.0 - 52.0 % 32.1  32.4  30.4   Platelets 150 - 400 K/uL 270  281  260       Latest Ref Rng & Units 03/10/2023    5:40 AM 03/09/2023    4:13 AM 03/08/2023    5:39 AM  CMP  Glucose 70 - 99 mg/dL 962  952  73   BUN 8 - 23 mg/dL 16  20  28    Creatinine 0.61 - 1.24 mg/dL 8.41  3.24  4.01   Sodium 135 - 145 mmol/L 130  133  134   Potassium 3.5 - 5.1 mmol/L 3.8  3.9  3.7   Chloride 98 - 111 mmol/L 103  107  107   CO2 22 - 32 mmol/L 23  22  23    Calcium 8.9 - 10.3 mg/dL 7.8  8.1  7.7   Total Protein 6.5 - 8.1 g/dL 5.4  5.6  5.2   Total Bilirubin 0.3 - 1.2 mg/dL 0.6  0.7  0.7   Alkaline Phos 38 - 126 U/L 59  63  52   AST 15 - 41 U/L 76  75  90   ALT 0 - 44 U/L 100  117  122      RADIOGRAPHIC STUDIES: I have personally reviewed the radiological images as listed and agreed with the findings in the report. EEG adult  Result Date: 03/07/2023 Jefferson Fuel, MD     03/08/2023  3:10 PM Routine EEG Report Gwynn Honeck is a 77 y.o. male with a history of altered mental status who is undergoing an EEG to evaluate for seizures. Report: This EEG was acquired with electrodes placed according to the International 10-20 electrode system (including Fp1, Fp2, F3, F4, C3, C4, P3, P4, O1, O2, T3, T4, T5, T6, A1, A2, Fz, Cz, Pz). The following electrodes  were missing or displaced: none. The occipital dominant rhythm was 4-6 Hz. This activity is reactive to stimulation. There are runs of faster activity most prominent over the left frontal region that are favored to be arousals. Drowsiness was manifested by background fragmentation; deeper stages of sleep were identified by K complexes and sleep spindles. There iws focal slowing over the left frontal region. There were occasional L  occipital sharp waves. There were no electrographic seizures identified. Photic stimulation and hyperventilation were not performed. Impression and clinical correlation: This EEG was obtained while awake and asleep and is abnormal due to: - moderate diffuse slowing indicative of global cerebral dysfunction - left focal slowing indicative of superimposed focal cerebral dysfunction in that region - sharp waves in the left occipital region indicating increased epileptogenicity in that region No electrographic seizures were captured during this recording. Bing Neighbors, MD [facility] [phone] If 7pm- 7am, please page neurology on call as listed in AMION.   DG Chest Port 1 View  Result Date: 03/07/2023 CLINICAL DATA:  Status post right thoracentesis EXAM: PORTABLE CHEST 1 VIEW COMPARISON:  Chest radiograph dated 03/07/2023 FINDINGS: Lines/tubes: Partially imaged inferior approach catheter projects over the left upper quadrant. Chest: Improved lung aeration with persistent left-greater-than-right interstitial and patchy opacities. Pleura: Decreased right pleural effusion. Persistent moderate left pleural effusion. No pneumothorax. Heart/mediastinum: Similar enlarged cardiomediastinal silhouette. Bones: No acute osseous abnormality. IMPRESSION: 1. Decreased right pleural effusion status post thoracentesis. No pneumothorax. 2. Persistent moderate left pleural effusion. 3. Improved lung aeration with persistent left-greater-than-right interstitial and patchy opacities. Electronically Signed   By: Agustin Cree M.D.   On: 03/07/2023 16:35   ECHOCARDIOGRAM LIMITED  Result Date: 03/07/2023    ECHOCARDIOGRAM LIMITED REPORT   Patient Name:   Miroslav Riggle    Date of Exam: 03/07/2023 Medical Rec #:  [id number]  Height:       60.0 in Accession #:    [id number] Weight:       107.8 lb Date of Birth:  10/22/1946 BSA:          1.435 m Patient Age:    76 years   BP:           92/60 mmHg Patient Gender: M          HR:            87 bpm. Exam Location:  [facility] Procedure: Limited Echo, Color Doppler and Cardiac Doppler Indications:     Tamponade I31.4  History:         Patient has prior history of Echocardiogram examinations, most                  recent 03/06/2023. CHF; Risk Factors:Hypertension. Tobacco abuse.  Sonographer:     Cristela Blue Referring Phys:  [id number] CARALYN HUDSON Diagnosing Phys: Marcina Millard MD IMPRESSIONS  1. Left ventricular ejection fraction, by estimation, is 60 to 65%. The left ventricle has normal function. The left ventricle has no regional wall motion abnormalities. Indeterminate diastolic filling due to E-A fusion.  2. Right ventricular systolic function is normal. The right ventricular size is normal.  3. Moderate pericardial effusion.  4. The mitral valve is normal in structure. Mild mitral valve regurgitation. No evidence of mitral stenosis.  5. The aortic valve is normal in structure. Aortic valve regurgitation is mild. No aortic stenosis is present.  6. The inferior vena cava is normal in size with greater than 50% respiratory variability, suggesting right atrial pressure of 3 mmHg. FINDINGS  Left Ventricle: Left ventricular ejection fraction, by estimation, is 60 to 65%. The left ventricle has normal function. The left ventricle has no regional wall motion abnormalities. The left ventricular internal cavity size was normal in size. There is  no left ventricular hypertrophy. Indeterminate diastolic filling due to E-A fusion. Right Ventricle: The right ventricular size is normal. No increase in right ventricular wall thickness. Right ventricular systolic function is normal. Left Atrium: Left atrial size was normal in size. Right Atrium: Right atrial size was normal in size. Pericardium: Pericardial effusion much reduced after pericardiocenetesis. No right atrial or right ventricular diastolic collapse. No evidence for preicardial tamonade. A moderately sized pericardial effusion is present. Mitral Valve: The  mitral valve is normal in structure. Mild mitral valve regurgitation. No evidence of mitral valve stenosis. Tricuspid Valve: The tricuspid valve is normal in structure. Tricuspid valve regurgitation is mild . No evidence of tricuspid stenosis. Aortic Valve: The aortic valve is normal in structure. Aortic valve regurgitation is mild. Aortic regurgitation PHT measures 436 msec. No aortic stenosis is present. Pulmonic Valve: The pulmonic valve was normal in structure. Pulmonic valve regurgitation is not visualized. No evidence of pulmonic stenosis. Aorta: The aortic root is normal in size and structure. Venous: The inferior vena cava is normal in size with greater than 50% respiratory variability, suggesting right atrial pressure of 3 mmHg. IAS/Shunts: No atrial level shunt detected by color flow Doppler. Additional Comments: Spectral Doppler performed. Color Doppler performed.  LEFT VENTRICLE PLAX 2D LVIDd:         4.40 cm LVIDs:         2.80 cm LV PW:         0.90 cm LV IVS:        0.80 cm LVOT diam:     2.00 cm LVOT Area:     3.14 cm  LEFT ATRIUM         Index LA diam:    2.80 cm 1.95 cm/m  AORTIC VALVE AI PHT:      436 msec  AORTA Ao Root diam: 3.00 cm MITRAL VALVE                TRICUSPID VALVE MV Area (PHT): 4.99 cm     TR Peak grad:   24.0 mmHg MV Decel Time: 152 msec     TR Vmax:        245.00 cm/s MV E velocity: 126.00 cm/s                             SHUNTS                             Systemic Diam: 2.00 cm Marcina Millard MD Electronically signed by Marcina Millard MD Signature Date/Time: 03/07/2023/1:09:22 PM    Final    CT HEAD WO CONTRAST ( )  Result Date: 03/07/2023 CLINICAL DATA:  Neuro deficit, acute, stroke suspected. Altered mental status. Memory loss. EXAM: CT HEAD WITHOUT CONTRAST TECHNIQUE: Contiguous axial images were obtained from the base of the skull through the vertex without intravenous contrast. RADIATION DOSE REDUCTION: This exam was performed according to the departmental  dose-optimization program which includes automated exposure control, adjustment of the mA and/or kV according to patient size and/or use of iterative reconstruction technique. COMPARISON:  Head CT and MRI 03/05/2023 FINDINGS: Brain: There is no evidence of an acute cortically based infarct, intracranial hemorrhage,  midline shift, or extra-axial fluid collection. The ventricles are normal in size. Mild-to-moderate vasogenic edema in the right frontal lobe, left occipital lobe, and right cerebellar hemisphere is unchanged and without significant associated mass effect. Mild to moderate hypodensities elsewhere in the cerebral white matter bilaterally are nonspecific but compatible with chronic small vessel ischemic disease. 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 Vascular: Calcified atherosclerosis at the skull base. No hyperdense vessel. Skull: No acute fracture or suspicious osseous lesion. Sinuses/Orbits: Minimal mucosal thickening in the paranasal sinuses. Clear mastoid air cells. Unremarkable orbits. Other: None. IMPRESSION: 1. Unchanged edema associated with known lesions in the right frontal lobe, left occipital lobe, and right cerebellum. No significant mass effect. 2. No evidence of a new intracranial abnormality. Electronically Signed   By: Sebastian Ache M.D.   On: 03/07/2023 11:23   DG Chest Port 1 View  Result Date: 03/07/2023 CLINICAL DATA:  Pleural effusion. EXAM: PORTABLE CHEST 1 VIEW COMPARISON:  Chest radiograph and CT 03/05/2023 FINDINGS: The cardiac silhouette remains enlarged. Aortic atherosclerosis is noted. Veiling opacities in the lower lungs are consistent with persistent small to moderate pleural effusions as shown on CT. Bilateral interstitial and patchy airspace opacities have slightly worsened. No pneumothorax is identified. IMPRESSION: 1. Slight worsening of bilateral lung opacities which may reflect edema or pneumonia. 2. Persistent bilateral pleural effusions.  Electronically Signed   By: Sebastian Ache M.D.   On: 03/07/2023 09:50   US Venous Img Lower Bilateral (DVT)  Result Date: 03/06/2023 CLINICAL DATA:  Lower extremity edema EXAM: BILATERAL LOWER EXTREMITY VENOUS DOPPLER ULTRASOUND TECHNIQUE: Gray-scale sonography with graded compression, as well as color Doppler and duplex ultrasound were performed to evaluate the lower extremity deep venous systems from the level of the common femoral vein and including the common femoral, femoral, profunda femoral, popliteal and calf veins including the posterior tibial, peroneal and gastrocnemius veins when visible. The superficial great saphenous vein was also interrogated. Spectral Doppler was utilized to evaluate flow at rest and with distal augmentation maneuvers in the common femoral, femoral and popliteal veins. COMPARISON:  None Available. FINDINGS: RIGHT LOWER EXTREMITY Common Femoral Vein: No evidence of thrombus. Normal compressibility, respiratory phasicity and response to augmentation. Saphenofemoral Junction: No evidence of thrombus. Normal compressibility and flow on color Doppler imaging. Profunda Femoral Vein: No evidence of thrombus. Normal compressibility and flow on color Doppler imaging. Femoral Vein: No evidence of thrombus. Normal compressibility, respiratory phasicity and response to augmentation. Popliteal Vein: No evidence of thrombus. Normal compressibility, respiratory phasicity and response to augmentation. Calf Veins: No evidence of thrombus. Normal compressibility and flow on color Doppler imaging. Superficial Great Saphenous Vein: No evidence of thrombus. Normal compressibility. Venous Reflux:  None. Other Findings:  None. LEFT LOWER EXTREMITY Common Femoral Vein: No evidence of thrombus. Normal compressibility, respiratory phasicity and response to augmentation. Saphenofemoral Junction: No evidence of thrombus. Normal compressibility and flow on color Doppler imaging. Profunda Femoral Vein: No  evidence of thrombus. Normal compressibility and flow on color Doppler imaging. Femoral Vein: No evidence of thrombus. Normal compressibility, respiratory phasicity and response to augmentation. Popliteal Vein: No evidence of thrombus. Normal compressibility, respiratory phasicity and response to augmentation. Calf Veins: No evidence of thrombus. Normal compressibility and flow on color Doppler imaging. Superficial Great Saphenous Vein: No evidence of thrombus. Normal compressibility. Venous Reflux:  None. Other Findings:  None. IMPRESSION: No evidence of deep venous thrombosis in either lower extremity. Electronically Signed   By: Alcide Clever M.D.   On:  03/06/2023 19:43   ECHOCARDIOGRAM COMPLETE  Result Date: 03/06/2023    ECHOCARDIOGRAM REPORT   Patient Name:  Arul Dorris    Date of      03/06/2023                           Exam: Medical Rec #: [id number]  Height:      60.0 in Accession #:   [id number] Weight:      99.2 lb Date of Birth: 30-Jul-1946 BSA:         1.386 m Patient Age:   76 years   BP:          Not listed in chart/Not listed in chart                                        mmHg Patient        M          HR:          Not listed in chart bpm. Gender: Exam Location: [facility] Procedure: 2D Echo, Cardiac Doppler and Color Doppler Indications:     Tamponade I31.4  History:         Patient has prior history of Echocardiogram examinations, most                  recent 03/05/2023. CHF; Risk Factors:Hypertension. Tobacco abuse.  Sonographer:     Cristela Blue Referring Phys:  [id number] [facility] MICHELLE TANG Diagnosing Phys: Marcina Millard MD IMPRESSIONS  1. Left ventricular ejection fraction, by estimation, is 60 to 65%. The left ventricle has normal function. The left ventricle has no regional wall motion abnormalities. Left ventricular diastolic parameters are indeterminate.  2. Right ventricular systolic function is normal. The right ventricular size is normal.  3. Successful pericardiocentesis with removal of 1100 cc  serosanguinous pericardial fluid.. Findings are consistent with cardiac tamponade.  4. The mitral valve is normal in structure. No evidence of mitral valve regurgitation. No evidence of mitral stenosis.  5. The aortic valve is normal in structure. Aortic valve regurgitation is not visualized. No aortic stenosis is present.  6. The inferior vena cava is normal in size with greater than 50% respiratory variability, suggesting right atrial pressure of 3 mmHg. FINDINGS  Left Ventricle: Left ventricular ejection fraction, by estimation, is 60 to 65%. The left ventricle has normal function. The left ventricle has no regional wall motion abnormalities. The left ventricular internal cavity size was normal in size. There is  no left ventricular hypertrophy. Left ventricular diastolic parameters are indeterminate. Right Ventricle: The right ventricular size is normal. No increase in right ventricular wall thickness. Right ventricular systolic function is normal. Left Atrium: Left atrial size was normal in size. Right Atrium: Right atrial size was normal in size. Pericardium: Successful pericardiocentesis with removal of 1100 cc serosanguinous pericardial fluid. There is no evidence of pericardial effusion. The pericardial effusion is circumferential. There is diastolic collapse of the right ventricular free wall. There is evidence of cardiac tamponade. Mitral Valve: The mitral valve is normal in structure. No evidence of mitral valve regurgitation. No evidence of mitral valve stenosis. Tricuspid Valve: The tricuspid valve is normal in structure. Tricuspid valve regurgitation is not demonstrated. No evidence of tricuspid stenosis. Aortic Valve: The aortic valve is normal in structure. Aortic valve regurgitation is not visualized. No aortic stenosis is  present. Pulmonic Valve: The pulmonic valve was normal in structure. Pulmonic valve regurgitation is not visualized. No evidence of pulmonic stenosis. Aorta: The aortic root is  normal in size and structure. Venous: The inferior vena cava is normal in size with greater than 50% respiratory variability, suggesting right atrial pressure of 3 mmHg. IAS/Shunts: No atrial level shunt detected by color flow Doppler.  LEFT VENTRICLE PLAX 2D LVIDd:         3.85 cm LVIDs:         3.10 cm LV PW:         0.75 cm LV IVS:        1.10 cm  RIGHT VENTRICLE RV Basal diam:  3.40 cm RV Mid diam:    2.50 cm LEFT ATRIUM         Index       RIGHT ATRIUM           Index LA diam:    4.40 cm 3.18 cm/m  RA Area:     12.50 cm                                 RA Volume:   27.90 ml  20.14 ml/m Marcina Millard MD Electronically signed by Marcina Millard MD Signature Date/Time: 03/06/2023/5:12:27 PM    Final    CARDIAC CATHETERIZATION  Result Date: 03/06/2023 Successful pericardiocentesis with placement of pigtail catheter   ECHOCARDIOGRAM COMPLETE  Result Date: 03/06/2023    ECHOCARDIOGRAM REPORT   Patient Name:   Zakye Ganser    Date of Exam: 03/05/2023 Medical Rec #:  [id number]  Height:       57.0 in Accession #:    [id number] Weight:       100.0 lb Date of Birth:  01/04/1946 BSA:          1.340 m Patient Age:    76 years   BP:           123/76 mmHg Patient Gender: M          HR:           74 bpm. Exam Location:  [facility] Procedure: 2D Echo, Cardiac Doppler and Color Doppler Indications:     I31.3 Pericardial Effusion  History:         Patient has prior history of Echocardiogram examinations, most                  recent 07/27/2018. CHF; Risk Factors:Hypertension, Current                  Smoker and Alcohol Abuse.  Sonographer:     Daphine Deutscher RDCS Referring Phys:  [id number] Verdene Lennert Diagnosing Phys: Yvonne Kendall MD IMPRESSIONS  1. Left ventricular ejection fraction, by estimation, is 55 to 60%. The left ventricle has normal function. The left ventricle has no regional wall motion abnormalities. Left ventricular diastolic parameters are consistent with Grade I diastolic dysfunction (impaired  relaxation).  2. Right ventricular systolic function is mildly reduced. The right ventricular size is normal. There is normal pulmonary artery systolic pressure.  3. Large pericardial effusion. The pericardial effusion is circumferential. Findings are consistent with cardiac tamponade.  4. The mitral valve is normal in structure. Mild mitral valve regurgitation.  5. Tricuspid valve regurgitation is mild to moderate.  6. The aortic valve is tricuspid. There is mild calcification of the aortic valve. There is mild thickening of the  aortic valve. Aortic valve regurgitation is mild to moderate.  7. The inferior vena cava is dilated in size with <50% respiratory variability, suggesting right atrial pressure of 15 mmHg. Conclusion(s)/Recommendation(s): Findings were conveyed to Dr. Gillis Santa at 8:02 AM on 03/06/2023 by Dr. Cristal Deer End. FINDINGS  Left Ventricle: Left ventricular ejection fraction, by estimation, is 55 to 60%. The left ventricle has normal function. The left ventricle has no regional wall motion abnormalities. The left ventricular internal cavity size was normal in size. There is  no left ventricular hypertrophy. Left ventricular diastolic parameters are consistent with Grade I diastolic dysfunction (impaired relaxation). Right Ventricle: The right ventricular size is normal. No increase in right ventricular wall thickness. Right ventricular systolic function is mildly reduced. There is normal pulmonary artery systolic pressure. The tricuspid regurgitant velocity is 1.71 m/s, and with an assumed right atrial pressure of 15 mmHg, the estimated right ventricular systolic pressure is 26.7 mmHg. Left Atrium: Left atrial size was normal in size. Right Atrium: Right atrial size was normal in size. Pericardium: A large pericardial effusion is present. The pericardial effusion is circumferential. The pericardial effusion appears to contain fibrous material. There is diastolic collapse of the right ventricular  free wall and excessive respiratory variation in the mitral valve spectral Doppler velocities. There is evidence of cardiac tamponade. Mitral Valve: The mitral valve is normal in structure. There is mild thickening of the mitral valve leaflet(s). Mild mitral valve regurgitation. Tricuspid Valve: The tricuspid valve is normal in structure. Tricuspid valve regurgitation is mild to moderate. Aortic Valve: The aortic valve is tricuspid. There is mild calcification of the aortic valve. There is mild thickening of the aortic valve. Aortic valve regurgitation is mild to moderate. Aortic regurgitation PHT measures 507 msec. Aortic valve mean gradient measures 2.5 mmHg. Aortic valve peak gradient measures 4.3 mmHg. Aortic valve area, by VTI measures 2.84 cm. Pulmonic Valve: The pulmonic valve was normal in structure. Pulmonic valve regurgitation is trivial. No evidence of pulmonic stenosis. Aorta: The aortic root and ascending aorta are structurally normal, with no evidence of dilitation. Pulmonary Artery: The pulmonary artery is of normal size. Venous: The inferior vena cava is dilated in size with less than 50% respiratory variability, suggesting right atrial pressure of 15 mmHg. IAS/Shunts: The interatrial septum was not well visualized.  LEFT VENTRICLE PLAX 2D LVIDd:         3.50 cm   Diastology LVIDs:         2.50 cm   LV e' medial:    4.46 cm/s LV PW:         0.90 cm   LV E/e' medial:  9.7 LV IVS:        0.80 cm   LV e' lateral:   6.31 cm/s LVOT diam:     2.00 cm   LV E/e' lateral: 6.9 LV SV:         46 LV SV Index:   34 LVOT Area:     3.14 cm  RIGHT VENTRICLE RV Basal diam:  3.10 cm RV S prime:     7.29 cm/s TAPSE (M-mode): 1.3 cm LEFT ATRIUM             Index        RIGHT ATRIUM           Index LA diam:        3.10 cm 2.31 cm/m   RA Area:     10.70 cm LA Vol (A2C):   37.7  ml 28.14 ml/m  RA Volume:   27.40 ml  20.45 ml/m LA Vol (A4C):   25.3 ml 18.88 ml/m LA Biplane Vol: 33.9 ml 25.30 ml/m  AORTIC VALVE AV  Area (Vmax):    2.86 cm AV Area (Vmean):   2.60 cm AV Area (VTI):     2.84 cm AV Vmax:           103.69 cm/s AV Vmean:          74.930 cm/s AV VTI:            0.161 m AV Peak Grad:      4.3 mmHg AV Mean Grad:      2.5 mmHg LVOT Vmax:         94.33 cm/s LVOT Vmean:        62.000 cm/s LVOT VTI:          0.145 m LVOT/AV VTI ratio: 0.90 AI PHT:            507 msec  AORTA Ao Root diam: 3.20 cm Ao Asc diam:  3.50 cm MITRAL VALVE               TRICUSPID VALVE MV Area (PHT): 4.28 cm    TR Peak grad:   11.7 mmHg MV Decel Time: 177 msec    TR Vmax:        171.00 cm/s MV E velocity: 43.23 cm/s MV A velocity: 63.73 cm/s  SHUNTS MV E/A ratio:  0.68        Systemic VTI:  0.15 m                            Systemic Diam: 2.00 cm Yvonne Kendall MD Electronically signed by Yvonne Kendall MD Signature Date/Time: 03/06/2023/8:13:55 AM    Final    MR Brain W and Wo Contrast  Result Date: 03/05/2023 CLINICAL DATA:  Mental status change, unknown cause EXAM: MRI HEAD WITHOUT AND WITH CONTRAST TECHNIQUE: Multiplanar, multiecho pulse sequences of the brain and surrounding structures were obtained without and with intravenous contrast. CONTRAST:  5mL GADAVIST GADOBUTROL 1 MMOL/ML IV SOLN COMPARISON:  Same day CT head FINDINGS: Brain: Negative for an acute infarct. No hydrocephalus. No extra-axial fluid collection. Small microhemorrhage in the right lentiform nucleus. Chronic left cerebellar infarct. There is sequela mild-to-moderate chronic microvascular ischemic change. There are multiple lesions, including- -1.0 x 0.7 cm contrast-enhancing lesion in the right frontal lobe with surrounding vasogenic edema (series 18, image 108) - 0.8 x 0.7 cm contrast-enhancing lesions in in the left occipital lobe with surrounding vasogenic edema (series 18, image 69). -1.2 x 1.1 cm contrast-enhancing lesion in the superior right cerebellum mild surrounding vasogenic edema (series 18, image 50) -0.6 x 0.4 cm dural-based contrast-enhancing lesion  along the left frontal convexity (series 93, image 176). Vascular: Normal flow voids. Skull and upper cervical spine: Normal marrow signal. Sinuses/Orbits: No middle ear or mastoid effusion. Mild mucosal thickening bilateral ethmoid air cells. Orbits are unremarkable. Other: None. IMPRESSION: 1. Multiple contrast-enhancing lesions in the right frontal lobe, left occipital lobe, and superior right cerebellum with surrounding vasogenic edema. 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 2. Small dural-based contrast-enhancing lesion along the left frontal convexity could represent a small meningimoa, but metastatic disease is not excluded. Electronically Signed   By: Lorenza Cambridge M.D.   On: 03/05/2023 16:48   CT CHEST ABDOMEN PELVIS W CONTRAST  Result Date: 03/05/2023 CLINICAL DATA:  Brain  mass. Assess for occult malignancy. * Tracking Code: BO * EXAM: CT CHEST, ABDOMEN, AND PELVIS WITH CONTRAST TECHNIQUE: Multidetector CT imaging of the chest, abdomen and pelvis was performed following the standard protocol during bolus administration of intravenous contrast. RADIATION DOSE REDUCTION: This exam was performed according to the departmental dose-optimization program which includes automated exposure control, adjustment of the mA and/or kV according to patient size and/or use of iterative reconstruction technique. CONTRAST:  75mL OMNIPAQUE IOHEXOL 300 MG/ML  SOLN COMPARISON:  Chest CT without contrast 04/28/2015. X-ray chest earlier 03/05/2023 FINDINGS: CT CHEST FINDINGS Cardiovascular: Very large pericardial effusion. The heart itself is nonenlarged. Significant coronary artery calcifications. The thoracic aorta has a normal course and caliber with mild atherosclerotic calcified plaque. Mediastinum/Nodes: Normal caliber thoracic esophagus. Preserved thyroid gland. No specific abnormal lymph node enlargement seen including in the axillary regions, hilum. Prominent right paratracheal node on series 2,  image 18 measures 18 by 7 mm. This could be 2 adjacent nodes. Subcarinal node series 2, image 29 measures 2.7 by 1.1 cm, mildly enlarged. Lungs/Pleura: Moderate right and small left effusion. There are some loculated components on the left with fluid tracking along the interlobar fissure. Adjacent parenchymal opacities. Atelectasis versus infiltrate. There is a opacity specifically in the lingula as well. Scattered ground-glass elsewhere in the visualized lungs. No pneumothorax. No obvious mass. Significant breathing motion throughout the examination. A subtle mass lesion could be obscured by the lung opacity in the effusions. Follow up study could be performed after improvement of the effusions. Musculoskeletal: Mild degenerative changes along the spine. CT ABDOMEN PELVIS FINDINGS Hepatobiliary: Contrast exam in the abdomen is more in the late arterial phase and portal venous phase. Patent portal vein. Gallbladder is present. Small cysts seen in segment 4 of the liver. Pancreas: Unremarkable. No pancreatic ductal dilatation or surrounding inflammatory changes. Spleen: Normal in size without focal abnormality. Adrenals/Urinary Tract: Slight nonspecific thickening of the adrenal glands but unchanged from previous. No enhancing renal mass or collecting system dilatation. There are some small low-attenuation lesions in the left kidney which are too small to completely characterize but likely benign cysts, Bosniak 2 lesions. No specific imaging follow-up. The ureters have normal course and caliber extending down to the bladder. Diffuse wall thickening of the urinary bladder. Stomach/Bowel: On this non oral contrast exam, large bowel has a normal caliber. Redundant course of the sigmoid colon. There are some areas of mild wall thickening along loops of bowel, nonspecific. The stomach is mildly distended with fluid. Mild fold thickening suggested small bowel is nondilated. Vascular/Lymphatic: Normal caliber aorta and IVC  with moderate calcified plaque along the aorta and iliac vessels. There are several enlarged nodes identified in the retroperitoneum and upper abdomen. Example aortocaval node posteriorly on series 2, image 64 measures 17 by 13 mm. No next of the celiac series 2, image 59 measures 16 by 14 mm. Left para-node anteriorly on series 2, image 68 measures 17 by 8 mm. A few small retrocrural nodes are also identified. Reproductive: Enlarged heterogeneous prostate. Please correlate with patient's PSA. Other: Anasarca.  Mesenteric haziness.  Trace ascites.  No free air Musculoskeletal: Mild degenerative changes along the spine. IMPRESSION: Large pericardial effusion. Please correlate with and new symptoms including for tamponade. Moderate right and small left effusion with some loculated components on the left. Adjacent parenchymal opacities. Atelectasis versus infiltrate. Recommend follow up after clearance of the effusions. Few borderline enlarged lymph nodes identified including mediastinum, upper retroperitoneum. Enlarged prostate with mass effect along the  bladder with bladder wall thickening. Please correlate with patient's PSA. No bowel obstruction or free air. There is scattered mesenteric haziness with trace ascites. There are some small areas of wall thickening along the colon but this could simply relate to the other signs of edema. Electronically Signed   By: Karen Kays M.D.   On: 03/05/2023 15:03   DG Chest Port 1 View  Result Date: 03/05/2023 CLINICAL DATA:  Shortness of breath EXAM: PORTABLE CHEST 1 VIEW COMPARISON:  07/27/2018 FINDINGS: Cardiac silhouette is enlarged with diffuse interstitial opacities throughout both lungs compatible with edema related to CHF. Basilar atelectasis noted, worse on the left. Difficult to exclude small effusions. Also left lower lobe retrocardiac opacity obscures the left hemidiaphragm concerning for possible superimposed pneumonia. No pneumothorax. Trachea midline. Aorta  atherosclerotic. No acute osseous finding. IMPRESSION: 1. Cardiomegaly with diffuse interstitial edema pattern. 2. Left lower lobe airspace consolidation concerning for superimposed pneumonia. 3. Suspect small effusions. Electronically Signed   By: Judie Petit.  Shick M.D.   On: 03/05/2023 13:20   CT Head Wo Contrast  Result Date: 03/05/2023 CLINICAL DATA:  Mental status change of unknown cause. Memory disturbance. EXAM: CT HEAD WITHOUT CONTRAST TECHNIQUE: Contiguous axial images were obtained from the base of the skull through the vertex without intravenous contrast. RADIATION DOSE REDUCTION: This exam was performed according to the departmental dose-optimization program which includes automated exposure control, adjustment of the mA and/or kV according to patient size and/or use of iterative reconstruction technique. COMPARISON:  07/27/2018 FINDINGS: Brain: No focal abnormality affects the brainstem. Question mild edema and mass effect in the inferior right cerebellum. 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 There is abnormal vasogenic edema within the right posterior frontal region worrisome for an underlying mass lesion. In the left hemisphere, there is abnormal vasogenic edema in the occipital lobe worrisome for the presence of an occult mass lesion. No visible hemorrhage. Hydrocephalus or extra-axial collection. Vascular: There is atherosclerotic calcification of the major vessels at the base of the brain. Skull: No calvarial abnormality. Sinuses/Orbits: Clear/normal Other: None IMPRESSION: 1. Abnormal vasogenic edema in the right posterior frontal region and in the left occipital lobe worrisome for the presence of occult mass lesions. Question mild edema and mass effect in the inferior right cerebellum. No visible hemorrhage. MRI with and without contrast is recommended for further evaluation. 2. 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 3. Atherosclerotic calcification of the major vessels at  the base of the brain. Electronically Signed   By: Paulina Fusi M.D.   On: 03/05/2023 12:52    Assessment and plan-   # Multiple brain mass with vasogenic edema. Neurosurgery recommendation reviewed. Continue Decadron and seizure prophylaxis with Keppra Consult radiology oncology for palliative radiation.   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 Prognosis is poor. Currently patient is not a candidate for aggressive chemotherapy treatments.  May consider immunotherapy outpatient, adding dose reduced chemotherapy if he continues to make progress.-Add PD-L1 staining If he continues to deteriorate, I think hospice comfort care is very reasonable.I had a lengthy patient's daughter Larita Fife today. Appreciate palliative care evaluation.   # Hyponatremia, sodium has improved.  # Transaminitis, could be secondary to chronic alcohol use. CIWA protocol    Thank you for allowing me to participate in the care of this patient.   Rickard Patience, MD, PhD Hematology Oncology 03/10/2023

## 2023-03-10 NOTE — Progress Notes (Signed)
[facility] Cardiology  SUBJECTIVE: Patient laying flat in bed, denies chest pain or shortness of breath   Vitals:   03/09/23 1659 03/09/23 2200 03/10/23 0400 03/10/23 0800  BP: 127/65 (!) 150/73 134/69 122/62  Pulse: 88 80 69 79  Resp:   15 18  Temp: 98.1 F (36.7 C) 97.8 F (36.6 C) 98 F (36.7 C) 97.8 F (36.6 C)  TempSrc:  Oral  Oral  SpO2: 99% 95% 100% 99%  Weight:      Height:         Intake/Output Summary (Last 24 hours) at 03/10/2023 0931 Last data filed at 03/10/2023 0020 Gross per 24 hour  Intake 1095 ml  Output 850 ml  Net 245 ml      PHYSICAL EXAM  General: Well developed, well nourished, in no acute distress HEENT:  Normocephalic and atramatic Neck:  No JVD.  Lungs: Clear bilaterally to auscultation and percussion. Heart: HRRR . Normal S1 and S2 without gallops or murmurs.  Abdomen: Bowel sounds are positive, abdomen soft and non-tender  Msk:  Back normal, normal gait. Normal strength and tone for age. Extremities: No clubbing, cyanosis or edema.   Neuro: Alert and oriented X 3. Psych:  Good affect, responds appropriately   LABS: Basic Metabolic Panel: Recent Labs    03/09/23 0413 03/10/23 0540  NA 133* 130*  K 3.9 3.8  CL 107 103  CO2 22 23  GLUCOSE 129* 122*  BUN 20 16  CREATININE 0.77 0.72  CALCIUM 8.1* 7.8*  MG 2.2 2.2  PHOS 3.5 3.2   Liver Function Tests: Recent Labs    03/09/23 0413 03/10/23 0540  AST 75* 76*  ALT 117* 100*  ALKPHOS 63 59  BILITOT 0.7 0.6  PROT 5.6* 5.4*  ALBUMIN 2.2* 2.1*   No results for input(s): "LIPASE", "AMYLASE" in the last 72 hours. CBC: Recent Labs    03/09/23 0413 03/10/23 0540  WBC 11.5* 11.4*  HGB 10.4* 10.2*  HCT 32.4* 32.1*  MCV 95.9 95.8  PLT 281 270   Cardiac Enzymes: No results for input(s): "CKTOTAL", "CKMB", "CKMBINDEX", "TROPONINI" in the last 72 hours. BNP: Invalid input(s): "POCBNP" D-Dimer: No results for input(s): "DDIMER" in the last 72 hours. Hemoglobin A1C: No results for  input(s): "HGBA1C" in the last 72 hours. Fasting Lipid Panel: No results for input(s): "CHOL", "HDL", "LDLCALC", "TRIG", "CHOLHDL", "LDLDIRECT" in the last 72 hours. Thyroid Function Tests: No results for input(s): "TSH", "T4TOTAL", "T3FREE", "THYROIDAB" in the last 72 hours.  Invalid input(s): "FREET3" Anemia Panel: Recent Labs    03/09/23 0413 03/10/23 0540  VITAMINB12  --  955*  FOLATE 11.8  --   TIBC 239*  --   IRON 41*  --     No results found.   Echo   Normal left ventricular function, EF 60-65%, moderate pericardial effusion pericardial tamponade, following pericardiocentesis  03/07/2023   TELEMETRY: Sinus rhythm:  ASSESSMENT AND PLAN:  Principal Problem:   Acute encephalopathy Active Problems:   Congestive heart failure (HCC)   Chronic obstructive pulmonary disease (HCC)   Acute hyponatremia   Pericardial effusion   Vasogenic edema (HCC)   Brain mass   Pleural effusion, bilateral   Alcohol use disorder   BPH (benign prostatic hyperplasia)   Elevated LFTs   Pneumonia of left lower lobe due to infectious organism   Metastasis to brain [facility])   Metastatic non-small cell lung cancer (HCC)   Goals of care, counseling/discussion    1.  Large pericardial effusion, with  cardiac tamponade, as post urgent pericardiocentesis 03/06/2023, with initial removal of 1.1 L of sanguinous pericardial fluid with resolution of pericardial tamponade, and additional removal of 0.4 L over the next 24 to 48 hours, pigtail catheter removed on 03/08/2023.  Cytology positive for metastatic adenocarcinoma. 2.  Paroxysmal atrial fibrillation with slow ventricular response, currently in sinus rhythm at 88 bpm 3.  Metastatic adenocarcinoma, likely pulmonary adenocarcinoma, with metastasis to brain, very poor prognosis, possible candidate for radiation therapy and outpatient immunotherapy versus palliative care, to be determined 4.  Alcohol abuse, patient typically drinks 6-7 beers per day,  withdrawl signs and symptoms, appears resolved 5.  Tobacco abuse   Recommendations   1.  Agree with current therapy 2.  Relook limited 2D echo 03/11/2023 3.  Defer chronic anticoagulation for atrial fibrillation in light of metastatic adenocarcinoma, with brain metastasis 4.  Transfer cardiology care to Dr. Welton Flakes on 03/11/2023   Cody Millard, MD, PhD, [facility] 03/10/2023 9:31 AM

## 2023-03-10 NOTE — Progress Notes (Addendum)
[facility] Progress Note  Patient: Cody [name]    ZOX:[id number]  DOA: 03/05/2023     Date of Service: the patient was seen and examined on 03/10/2023  Chief Complaint  Patient presents with   Memory Loss   Brief hospital course: Cody [name] is a 77 y.o. male with medical history significant of hypertension, HFpEF, alcohol use disorder, BPH, who presents to the ED due to altered mental status.  Patient's son stated that he is having shortness of breath for past 1 week productive cough.  For past 2 to 3 days patient is talking randomly which does not make any sense and having memory issues.  Patient drinks 10 beers per day and active smoker. ED w/up: VS stable Hyponatremia sodium 117, mild acidosis CO2 18, mild hyperglycemia blood glucose 154, elevated AST and ALT, leukocytosis WBC 13.2 CXR:  Cardiomegaly with diffuse interstitial edema pattern. Left lower lobe airspace consolidation concerning for superimposed pneumonia. Suspect small effusions. CT head:  old CVA and vasogenic edema MRI brain: . Multiple contrast-enhancing lesions in the right frontal lobe, left occipital lobe, and superior right cerebellum with surrounding vasogenic edema. 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 Small dural-based contrast-enhancing lesion along the left frontal convexity could represent a small meningimoa, but metastatic disease is not excluded. CT Chest/a/p: Large pericardial effusion. Please correlate with and new symptoms including for tamponade.   Moderate right and small left effusion with some loculated components on the left. Adjacent parenchymal opacities. Atelectasis versus infiltrate. Recommend follow up after clearance of the effusions. Few borderline enlarged lymph nodes identified including mediastinum, upper retroperitoneum. Enlarged prostate with mass effect along the bladder with bladder wall thickening. Please correlate with patient's PSA. No bowel obstruction or free air.  There is scattered mesenteric haziness with trace ascites. There are some small areas of wall thickening along the colon but this could simply relate to the other signs of edema.  Assessment and Plan:  # Acute metabolic encephalopathy most likely due to metastatic brain lesions and vasogenic edema. Symptoms improving, currently patient is back to his baseline Patient was agitated at last night on 5/8 the patient was given lorazepam 4 mg, trazodone 50 mg, and melatonin 5 mg the patient was very sleepy in the morning on 5/9.  CT head was done did not show any acute/new findings. Pulmonary critical care was consulted, ABG was done, EEG was don.  Patient's mental status gradually improved.   # Metastatic adenocarcinoma, compatible with pulmonary adenocarcinoma, stage IV with metastatic pericardial effusion, pleural effusion, and metastasis to brain with vasogenic edema CT scan and MRI brain reviewed S/p Decadron 8 mg IV x 1 dose, s/p Decadron 4 mg p.o. daily till 5/10 5/1 Decadron 2 mg p.o. twice daily Continue Keppra 500 mg IV twice daily for seizure prophylaxis Neurosurgery consulted, recommended no intervention at this time but if there is no diagnosis then brain biopsy can be done next week. 5/9 CT Head: Unchanged edema associated with known lesions in the right frontal lobe, left occipital lobe, and right cerebellum. No significant mass effect. No evidence of a new intracranial abnormality. As per oncologist patient is not a candidate for aggressive chemotherapy, recommended radiation oncology for evaluation of radiation for brain mets.  If patient remains stable then consider immunotherapy in the outpatient setting. .-Add PD-L1 staining. If he continues to deteriorate, I think hospice comfort care is very reasonable.    # Pericardial effusion CT chest shows large pericardial effusion 2D echocardiogram showed large pericardial fusion  with possible tamponade Cardiology was consulted, s/p  pericardiocentesis 1.1 liters bloody fluid was tapped and sample was sent for cytology Pericardial catheter intact, may need drainage again tomorrow a.m. Cardiology consult appreciated Patient feels significant improvement in the shortness of breath 5/9 200 cc pericardial fluid was drained by cardiology 5/10 pigtail catheter was removed today by cardiology. Repeat 2D echocardiogram tomorrow a.m.  # Acute onset A-fib with controlled ventricular rate, developed after pericardiocentesis Continue monitor on telemetry Cardiology following, Defer chronic anticoagulation for atrial fibrillation in light of metastatic adenocarcinoma, with brain metastasis  5/10 started amiodarone 200 mg p.o. daily   # Bilateral pleural effusion, right >left most likely due to metastatic disease Unknown primary Patient got pericardiocentesis done today so we will postpone thoracentesis CT chest/AP reviewed as above Oncologist consulted, recommended therapeutic thoracentesis, sample for cytology cell count and other routine labs. Patient may have underlying lung cancer due to history of smoking. 5/9 CXR: Slight worsening of bilateral lung opacities which may reflect edema or pneumonia.  Persistent bilateral pleural effusions. Continue ceftriaxone and doxycycline for possible underlying pneumonia 5/9 s/p right thoracentesis 500 mL fluid was tapped by [facility] attending, fluid studies are consistent with transudative fluid.  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 Uric acid 9.7, elevated Started colchicine 0.6 mg p.o. daily Hold allopurinol for now   #  CAD, HTN and chronic diastolic CHF Patient was on aspirin, Coreg, Entresto, statin, med rec shows patient is not taking medication, seems noncompliant. Continue to monitor BP and titrate medications accordingly   # COPD, possible underlying pneumonia Continue empiric antibiotics ceftriaxone and doxycycline Continue Decadron Continue DuoNeb as needed   # Alcohol use disorder Long-term history of alcohol use disorder that is currently active.  No evidence of withdrawal on exam at this time. - CIWA protocol with Ativan as needed - TOC consultation for resources - Daily folic acid, thiamine and multivitamin  # BPH and urinary retention, Foley catheter was inserted on 5/8, 620 mL urine retention as per bladder scan Continue Flomax and finasteride  # Elevated LFTs most likely secondary to alcohol use disorder LFTs gradually improving, continue to monitor  Iron deficiency anemia, iron level is slightly low.  Started oral iron supplement folate within normal range and B12 within normal range  Vitamin D deficiency: started vitamin D 50,000 units p.o. weekly, follow with PCP to repeat vitamin D level after 3 to 6 months.  Body mass index is 19.38 kg/m.  Interventions:       Diet: Regular diet and protein supplement  DVT Prophylaxis: SCD, pharmacological prophylaxis contraindicated due to procedure brain mets/vasogenic edema    Advance goals of care discussion: Full code  Family Communication: family was present at bedside, at the time of interview.  The pt provided permission to discuss medical plan with the family. Opportunity was given to ask question and all questions were answered satisfactorily.   Disposition:  Pt is from Home, admitted with pericardial fusion, bilateral pleural effusion, metastatic disease, brain mets with vasogenic edem, which precludes a safe discharge. Discharge to Home, when stable, may need few days more to improve..  Subjective: No significant overnight  events, patient is feeling  improvement in the shortness of breath, denies any chest pain or palpitations, resting comfortably.  Did not offer any complaints.   Physical Exam: General: NAD, lying comfortably Appear in no distress Eyes: PERRLA ENT: Oral Mucosa Clear, dry Neck: no JVD,  Cardiovascular: Irregular rhythm,  no Murmur,  Respiratory: Good air entry bilaterally, bibasilar crackles, no wheezing appreciated.   Abdomen: Bowel Sound present, Soft and no tenderness,  Skin: no rashes Extremities: no Pedal edema, no calf tenderness Neurologic: without any new focal findings Gait not checked due to patient safety concerns  Vitals:   03/09/23 1659 03/09/23 2200 03/10/23 0400 03/10/23 0800  BP: 127/65 (!) 150/73 134/69 122/62  Pulse: 88 80 69 79  Resp:   15 18  Temp: 98.1 F (36.7 C) 97.8 F (36.6 C) 98 F (36.7 C) 97.8 F (36.6 C)  TempSrc:  Oral  Oral  SpO2: 99% 95% 100% 99%  Weight:      Height:        Intake/Output Summary (Last 24 hours) at 03/10/2023 1457 Last data filed at 03/10/2023 1430 Gross per 24 hour  Intake 1055 ml  Output 1650 ml  Net -595 ml   Filed Weights   03/06/23 0000 03/06/23 0500 03/07/23 0500  Weight: 45 kg 45 kg 48.9 kg    Data Reviewed: I have personally reviewed and interpreted daily labs, tele strips, imagings as discussed above. I reviewed all nursing notes, pharmacy notes, vitals, pertinent old records I have discussed plan of care as described above with RN and patient/family.  CBC: Recent Labs  Lab 03/06/23 0258 03/07/23 0308 03/07/23 1628 03/08/23 0539 03/09/23 0413 03/10/23 0540  WBC 11.3* 14.2*  --  12.1* 11.5* 11.4*  NEUTROABS 9.2*  --   --   --   --   --   HGB 10.9* 10.0*  --  9.7* 10.4* 10.2*  HCT 32.2* 31.0* 32.6* 30.4* 32.4* 32.1*  MCV 91.5 96.9  --  97.1 95.9 95.8  PLT 309 274  --  260 281 270   Basic Metabolic Panel: Recent Labs  Lab 03/06/23 0258 03/06/23 0849 03/06/23 1455 03/07/23 0308 03/07/23 0719  03/07/23 1221 03/07/23 1601 03/08/23 0539 03/09/23 0413 03/10/23 0540  NA 124* 125*   < > 130*   < > 131* 131* 134* 133* 130*  K 5.0  --   --  4.5  --   --   --  3.7 3.9 3.8  CL 95*  --   --  102  --   --   --  107 107 103  CO2 17*  --   --  18*  --   --   --  23 22 23   GLUCOSE 129*  --   --  100*  --   --   --  73 129* 122*  BUN 42*  --   --  43*  --   --   --  28* 20 16  CREATININE 0.99  --   --  0.89  --   --   --  0.82 0.77 0.72  CALCIUM 8.5*  --   --  8.0*  --   --   --  7.7* 8.1* 7.8*  MG  --  2.5*  --  2.5*  --   --   --  2.3 2.2 2.2  PHOS  --  4.3  --  4.5  --   --   --  3.1 3.5 3.2   < > = values  in this interval not displayed.    Studies: No results found.  Scheduled Meds:  amiodarone  200 mg Oral Daily   Chlorhexidine Gluconate Cloth  6 each Topical Daily   colchicine  0.6 mg Oral Daily   dexamethasone  2 mg Oral Q12H   feeding supplement  237 mL Oral TID BM   folic acid  1 mg Oral Daily   multivitamin with minerals  1 tablet Oral Daily   sodium chloride flush  3 mL Intravenous Q12H   sodium chloride flush  3 mL Intravenous Q12H   tamsulosin  0.4 mg Oral Daily   thiamine  100 mg Oral Daily   Or   thiamine  100 mg Intravenous Daily   Continuous Infusions:  sodium chloride 10 mL (03/08/23 2257)   sodium chloride 1 mL/kg/hr (03/08/23 1900)   cefTRIAXone (ROCEPHIN)  IV 2 g (03/10/23 1433)   doxycycline (VIBRAMYCIN) IV 100 mg (03/10/23 1050)   levETIRAcetam 500 mg (03/10/23 0553)   PRN Meds: sodium chloride, acetaminophen **OR** acetaminophen, alum & mag hydroxide-simeth, ipratropium-albuterol, ondansetron **OR** ondansetron (ZOFRAN) IV, sodium chloride flush  Time spent: 50 minutes  Author: Gillis Santa. MD [facility] 03/10/2023 2:57 PM  To reach On-call, see care teams to locate the attending and reach out to them via [email or web address]. If 7PM-7AM, please contact night-coverage If you still have difficulty reaching the attending provider, please page  the [facility] (Director on Call) for [facility] on amion for assistance.

## 2023-03-11 ENCOUNTER — Inpatient Hospital Stay
Admit: 2023-03-11 | Discharge: 2023-03-11 | Disposition: A | Discharge status: Home / Self Care | Payer: Medicare Other | Attending: Cardiology | Admitting: Cardiology

## 2023-03-11 DIAGNOSIS — I509 Heart failure, unspecified: Secondary | ICD-10-CM | POA: Diagnosis not present

## 2023-03-11 DIAGNOSIS — Z7189 Other specified counseling: Secondary | ICD-10-CM | POA: Diagnosis not present

## 2023-03-11 DIAGNOSIS — G9340 Encephalopathy, unspecified: Secondary | ICD-10-CM | POA: Diagnosis not present

## 2023-03-11 LAB — ECHOCARDIOGRAM LIMITED
Height: 60 in
P 1/2 time: 343 msec
S' Lateral: 3.3 cm
Weight: 1724.88 oz

## 2023-03-11 LAB — CRYPTOCOCCUS ANTIGEN, SERUM: Cryptococcus Antigen, Serum: NEGATIVE

## 2023-03-11 LAB — HEPATIC FUNCTION PANEL
ALT: 88 U/L — ABNORMAL HIGH (ref 0–44)
AST: 59 U/L — ABNORMAL HIGH (ref 15–41)
Albumin: 2 g/dL — ABNORMAL LOW (ref 3.5–5.0)
Alkaline Phosphatase: 64 U/L (ref 38–126)
Bilirubin, Direct: <0.1 mg/dL (ref 0.0–0.2)
Total Bilirubin: 0.7 mg/dL (ref 0.3–1.2)
Total Protein: 5.3 g/dL — ABNORMAL LOW (ref 6.5–8.1)

## 2023-03-11 LAB — PHOSPHORUS: Phosphorus: 3.8 mg/dL (ref 2.5–4.6)

## 2023-03-11 LAB — CBC
HCT: 34.1 % — ABNORMAL LOW (ref 39.0–52.0)
Hemoglobin: 11.1 g/dL — ABNORMAL LOW (ref 13.0–17.0)
MCH: 30.8 pg (ref 26.0–34.0)
MCHC: 32.6 g/dL (ref 30.0–36.0)
MCV: 94.7 fL (ref 80.0–100.0)
Platelets: 263 10*3/uL (ref 150–400)
RBC: 3.6 MIL/uL — ABNORMAL LOW (ref 4.22–5.81)
RDW: 13.6 % (ref 11.5–15.5)
WBC: 12.2 10*3/uL — ABNORMAL HIGH (ref 4.0–10.5)
nRBC: 0 % (ref 0.0–0.2)

## 2023-03-11 LAB — BASIC METABOLIC PANEL
Anion gap: 6 (ref 5–15)
BUN: 17 mg/dL (ref 8–23)
CO2: 23 mmol/L (ref 22–32)
Calcium: 8.3 mg/dL — ABNORMAL LOW (ref 8.9–10.3)
Chloride: 104 mmol/L (ref 98–111)
Creatinine, Ser: 0.76 mg/dL (ref 0.61–1.24)
GFR, Estimated: >60 mL/min (ref >60)
Glucose, Bld: 126 mg/dL — ABNORMAL HIGH (ref 70–99)
Potassium: 4.2 mmol/L (ref 3.5–5.1)
Sodium: 133 mmol/L — ABNORMAL LOW (ref 135–145)

## 2023-03-11 LAB — BODY FLUID CULTURE W GRAM STAIN: Culture: NO GROWTH

## 2023-03-11 LAB — LEGIONELLA PNEUMOPHILA SEROGP 1 UR AG: L. pneumophila Serogp 1 Ur Ag: NEGATIVE

## 2023-03-11 LAB — MAGNESIUM: Magnesium: 2 mg/dL (ref 1.7–2.4)

## 2023-03-11 MED ORDER — CEFUROXIME AXETIL 500 MG PO TABS
500.0000 mg | ORAL_TABLET | Freq: Two times a day (BID) | ORAL | Status: DC
  Administered 2023-03-12: 500 mg via ORAL
  Filled 2023-03-11 (×2): qty 1

## 2023-03-11 MED ORDER — DOXYCYCLINE HYCLATE 100 MG PO TABS
100.0000 mg | ORAL_TABLET | Freq: Two times a day (BID) | ORAL | Status: DC
  Administered 2023-03-11 – 2023-03-12 (×2): 100 mg via ORAL
  Filled 2023-03-11 (×2): qty 1

## 2023-03-11 MED ORDER — BISACODYL 5 MG PO TBEC
10.0000 mg | DELAYED_RELEASE_TABLET | Freq: Every day | ORAL | Status: DC | PRN
  Administered 2023-03-12: 10 mg via ORAL
  Filled 2023-03-11: qty 2

## 2023-03-11 MED ORDER — BISACODYL 10 MG RE SUPP: 10.0000 mg | Freq: Every day | RECTAL | Status: DC | PRN

## 2023-03-11 MED ORDER — POLYETHYLENE GLYCOL 3350 17 G PO PACK
17.0000 g | PACK | Freq: Two times a day (BID) | ORAL | Status: DC
  Administered 2023-03-11 – 2023-03-12 (×3): 17 g via ORAL
  Filled 2023-03-11 (×3): qty 1

## 2023-03-11 MED ORDER — BISACODYL 5 MG PO TBEC
10.0000 mg | DELAYED_RELEASE_TABLET | Freq: Once | ORAL | Status: AC
  Administered 2023-03-11: 10 mg via ORAL
  Filled 2023-03-11: qty 2

## 2023-03-11 MED ORDER — LEVETIRACETAM 500 MG PO TABS
500.0000 mg | ORAL_TABLET | Freq: Two times a day (BID) | ORAL | Status: DC
  Administered 2023-03-11 – 2023-03-12 (×2): 500 mg via ORAL
  Filled 2023-03-11 (×2): qty 1

## 2023-03-11 NOTE — Care Management Important Message (Signed)
Important Message  Patient Details  Name: Cody [name] MRN: [id number] Date of Birth: 03-12-1946   Medicare Important Message Given:  N/A - LOS <3 / Initial given by admissions     Olegario Messier A Achilles Neville 03/11/2023, 11:02 AM

## 2023-03-11 NOTE — Progress Notes (Signed)
Daily Progress Note   Patient Name: Cody [name]       Date: 03/11/2023 DOB: 17-May-1946  Age: 77 y.o. MRN#: [id number] Attending Physician: Gillis Santa, MD Primary Care Physician: Orson Eva, NP Admit Date: 03/05/2023  Reason for Consultation/Follow-up: Establishing goals of care  Subjective: Notes and labs reviewed.  Per staff family members very, and come and go so that there is someone at bedside with him much of the time.  In to see patient.  He is currently resting without closed in bedside chair.  His son-in-law is at bedside.  Son-in-law discusses that patient has been married 4 times, and has 11 children.  He states current wife and patient share an 65 year old child.  Son-in-law states everyone in the family works, including patient's wife as they just bought a new house.  He states the family is close and has been speaking frequently, and currently plans are in place for patient to attempt to improve in status such that he is able to undergo oncologic intervention.  Son-in-law states that patient's wife should be present at bedside tomorrow.  Will follow-up tomorrow.   Length of Stay: 6  Current Medications: Scheduled Meds:   amiodarone  200 mg Oral Daily   [START ON 03/12/2023] cefUROXime  500 mg Oral BID WC   Chlorhexidine Gluconate Cloth  6 each Topical Daily   colchicine  0.6 mg Oral Daily   dexamethasone  2 mg Oral Q12H   doxycycline  100 mg Oral Q12H   feeding supplement  237 mL Oral TID BM   folic acid  1 mg Oral Daily   iron polysaccharides  150 mg Oral Daily   levETIRAcetam  500 mg Oral BID   multivitamin with minerals  1 tablet Oral Daily   polyethylene glycol  17 g Oral BID   sodium chloride flush  3 mL Intravenous Q12H   sodium chloride flush  3 mL Intravenous Q12H    tamsulosin  0.4 mg Oral Daily   thiamine  100 mg Oral Daily   Or   thiamine  100 mg Intravenous Daily   Vitamin D (Ergocalciferol)  50,000 Units Oral Q7 days    Continuous Infusions:  sodium chloride 10 mL (03/08/23 2257)   sodium chloride 1 mL/kg/hr (03/08/23 1900)    PRN Meds: sodium chloride, acetaminophen **OR**  acetaminophen, alum & mag hydroxide-simeth, bisacodyl, bisacodyl, ipratropium-albuterol, ondansetron **OR** ondansetron (ZOFRAN) IV, sodium chloride flush  Physical Exam Constitutional:      Comments: Opens eyes intermittently  Pulmonary:     Effort: Pulmonary effort is normal.             Vital Signs: BP (!) 143/67 (BP Location: Right Arm)   Pulse 71   Temp 97.6 F (36.4 C) (Oral)   Resp 18   Ht 5' (1.524 m)   Wt 48.9 kg   SpO2 98%   BMI 21.05 kg/m  SpO2: SpO2: 98 % O2 Device: O2 Device: Room Air O2 Flow Rate: O2 Flow Rate (L/min): 2 L/min  Intake/output summary:  Intake/Output Summary (Last 24 hours) at 03/11/2023 1543 Last data filed at 03/11/2023 1155 Gross per 24 hour  Intake 240 ml  Output 2150 ml  Net -1910 ml   LBM: Last BM Date :  (PTA) Baseline Weight: Weight: 45.4 kg Most recent weight: Weight: 48.9 kg    Patient Active Problem List   Diagnosis Date Noted   Metastatic non-small cell lung cancer (HCC) 03/08/2023   Goals of care, counseling/discussion 03/08/2023   Metastasis to brain (HCC) 03/06/2023   Acute encephalopathy 03/05/2023   Acute hyponatremia 03/05/2023   Pericardial effusion 03/05/2023   Vasogenic edema (HCC) 03/05/2023   Brain mass 03/05/2023   Pleural effusion, bilateral 03/05/2023   Alcohol use disorder 03/05/2023   BPH (benign prostatic hyperplasia) 03/05/2023   Elevated LFTs 03/05/2023   Pneumonia of left lower lobe due to infectious organism 03/05/2023   Congestive heart failure (HCC) 12/24/2022   Chronic obstructive pulmonary disease (HCC) 12/24/2022   Mixed hyperlipidemia 12/24/2022   Nicotine dependence  with nicotine-induced disorder 12/24/2022   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   Unstable angina (HCC) 08/13/2018   Syncope 07/27/2018    Palliative Care Assessment & Plan     Recommendations/Plan:  PMT will follow   Code Status:    Code Status Orders  (From admission, onward)           Start     Ordered   03/05/23 1544  Full code  Continuous       Question:  By:  Answer:  Consent: discussion documented in EHR   03/05/23 1545           Code Status History     Date Active Date Inactive Code Status Order ID Comments User Context   08/25/2018 1408 08/25/2018 1923 Full Code [id number]  Laurier Nancy, MD Inpatient   07/27/2018 0224 07/27/2018 2126 Full Code [id number]  Barbaraann Rondo, MD ED   07/27/2018 0224 07/27/2018 0224 Full Code [id number]  Barbaraann Rondo, MD ED      Thank you for allowing the Palliative Medicine Team to assist in the care of this patient.    Morton Stall, NP  Please contact Palliative Medicine Team phone at [phone] for questions and concerns.

## 2023-03-11 NOTE — Progress Notes (Signed)
*  PRELIMINARY RESULTS* Echocardiogram 2D Echocardiogram has been performed.  Cristela Blue 03/11/2023, 11:19 AM

## 2023-03-11 NOTE — Plan of Care (Signed)
  Problem: Education: Goal: Knowledge of General Education information will improve Description: Including pain rating scale, medication(s)/side effects and non-pharmacologic comfort measures Outcome: Progressing   Problem: Clinical Measurements: Goal: Respiratory complications will improve Outcome: Progressing   Problem: Clinical Measurements: Goal: Cardiovascular complication will be avoided Outcome: Progressing   Problem: Elimination: Goal: Will not experience complications related to urinary retention Outcome: Progressing   Problem: Pain Managment: Goal: General experience of comfort will improve Outcome: Progressing   Problem: Safety: Goal: Ability to remain free from injury will improve Outcome: Progressing   

## 2023-03-11 NOTE — Progress Notes (Signed)
[facility] Progress Note  Patient: Cody [name]    ZOX:[id number]  DOA: 03/05/2023     Date of Service: the patient was seen and examined on 03/11/2023  Chief Complaint  Patient presents with   Memory Loss   Brief hospital course: Cody [name] is a 77 y.o. male with medical history significant of hypertension, HFpEF, alcohol use disorder, BPH, who presents to the ED due to altered mental status.  Patient's son stated that he is having shortness of breath for past 1 week productive cough.  For past 2 to 3 days patient is talking randomly which does not make any sense and having memory issues.  Patient drinks 10 beers per day and active smoker. ED w/up: VS stable Hyponatremia sodium 117, mild acidosis CO2 18, mild hyperglycemia blood glucose 154, elevated AST and ALT, leukocytosis WBC 13.2 CXR:  Cardiomegaly with diffuse interstitial edema pattern. Left lower lobe airspace consolidation concerning for superimposed pneumonia. Suspect small effusions. CT head:  old CVA and vasogenic edema MRI brain: . Multiple contrast-enhancing lesions in the right frontal lobe, left occipital lobe, and superior right cerebellum with surrounding vasogenic edema. 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 Small dural-based contrast-enhancing lesion along the left frontal convexity could represent a small meningimoa, but metastatic disease is not excluded. CT Chest/a/p: Large pericardial effusion. Please correlate with and new symptoms including for tamponade.   Moderate right and small left effusion with some loculated components on the left. Adjacent parenchymal opacities. Atelectasis versus infiltrate. Recommend follow up after clearance of the effusions. Few borderline enlarged lymph nodes identified including mediastinum, upper retroperitoneum. Enlarged prostate with mass effect along the bladder with bladder wall thickening. Please correlate with patient's PSA. No bowel obstruction or free air.  There is scattered mesenteric haziness with trace ascites. There are some small areas of wall thickening along the colon but this could simply relate to the other signs of edema.  Assessment and Plan:  # Acute metabolic encephalopathy most likely due to metastatic brain lesions and vasogenic edema. Symptoms improving, currently patient is back to his baseline Patient was agitated at last night on 5/8 the patient was given lorazepam 4 mg, trazodone 50 mg, and melatonin 5 mg the patient was very sleepy in the morning on 5/9.  CT head was done did not show any acute/new findings. Pulmonary critical care was consulted, ABG was done, EEG was don.  Patient's mental status gradually improved.   # Metastatic adenocarcinoma, compatible with pulmonary adenocarcinoma, stage IV with metastatic pericardial effusion, pleural effusion, and metastasis to brain with vasogenic edema CT scan and MRI brain reviewed S/p Decadron 8 mg IV x 1 dose, s/p Decadron 4 mg p.o. daily till 5/10 5/1 Decadron 2 mg p.o. twice daily Continue Keppra 500 mg IV twice daily for seizure prophylaxis Neurosurgery consulted, recommended no intervention at this time but if there is no diagnosis then brain biopsy can be done next week. 5/9 CT Head: Unchanged edema associated with known lesions in the right frontal lobe, left occipital lobe, and right cerebellum. No significant mass effect. No evidence of a new intracranial abnormality. As per oncologist patient is not a candidate for aggressive chemotherapy, recommended radiation oncology for evaluation of radiation for brain mets.  If patient remains stable then consider immunotherapy in the outpatient setting. .-Add PD-L1 staining. If he continues to deteriorate, I think hospice comfort care is very reasonable.    # Pericardial effusion, resolved s/p pericardiocentesis. CT chest shows large pericardial effusion 2D echocardiogram showed  large pericardial fusion with possible  tamponade Cardiology was consulted, s/p pericardiocentesis 1.1 liters bloody fluid was tapped and sample was sent for cytology Pericardial catheter intact, may need drainage again tomorrow a.m. Cardiology consult appreciated Patient feels significant improvement in the shortness of breath 5/9 200 cc pericardial fluid was drained by cardiology 5/10 pigtail catheter was removed today by cardiology. 5/13, TTE, there is no evidence of pericardial effusion.  LVEF 60 to 65%.   # Acute onset A-fib with controlled ventricular rate, developed after pericardiocentesis Continue monitor on telemetry Cardiology following, Defer chronic anticoagulation for atrial fibrillation in light of metastatic adenocarcinoma, with brain metastasis  5/10 started amiodarone 200 mg p.o. daily   # Bilateral pleural effusion, right >left most likely due to metastatic disease Unknown primary Patient got pericardiocentesis done today so we will postpone thoracentesis CT chest/AP reviewed as above Oncologist consulted, recommended therapeutic thoracentesis, sample for cytology cell count and other routine labs. Patient may have underlying lung cancer due to history of smoking. 5/9 CXR: Slight worsening of bilateral lung opacities which may reflect edema or pneumonia.  Persistent bilateral pleural effusions. Continue ceftriaxone and doxycycline for possible underlying pneumonia 5/9 s/p right thoracentesis 500 mL fluid was tapped by [facility] attending, fluid studies are consistent with transudative fluid.  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 Uric acid 9.7,  elevated Started colchicine 0.6 mg p.o. daily Hold allopurinol for now   # CAD, HTN and chronic diastolic CHF Patient was on aspirin, Coreg, Entresto, statin, med rec shows patient is not taking medication, seems noncompliant. Continue to monitor BP and titrate medications accordingly   # COPD, possible underlying pneumonia Continue empiric antibiotics ceftriaxone and doxycycline Continue Decadron Continue DuoNeb as needed   # Alcohol use disorder Long-term history of alcohol use disorder that is currently active.  No evidence of withdrawal on exam at this time. - CIWA protocol with Ativan as needed - TOC consultation for resources - Daily folic acid, thiamine and multivitamin  # BPH and urinary retention, Foley catheter was inserted on 5/8, 620 mL urine retention as per bladder scan Continue Flomax and finasteride 5/13, discontinue Foley catheter, follow voiding trial.  # Elevated LFTs most likely secondary to alcohol use disorder LFTs gradually improving, continue to monitor  # Iron deficiency anemia, iron level is slightly low.  Started oral iron supplement folate within normal range and B12 within normal range  # Vitamin D deficiency: started vitamin D 50,000 units p.o. weekly, follow with PCP to repeat vitamin D level after 3 to 6 months.  Body mass index is 19.38 kg/m.  Interventions:       Diet: Regular diet and protein supplement  DVT Prophylaxis: SCD, pharmacological prophylaxis contraindicated due to procedure brain mets/vasogenic edema    Advance goals of care discussion: Full code  Family Communication: family was present at bedside, at the time of interview.  The pt provided permission to discuss medical plan with the family. Opportunity was given to ask question and all questions were answered satisfactorily.   Disposition:  Pt is from Home, admitted with pericardial fusion, bilateral pleural effusion, metastatic disease, brain mets with vasogenic edem,  which precludes a safe  discharge. Discharge to Home, when stable, may discharge in 1 to 2 days  Subjective: No significant overnight events, patient was sitting on the recliner, worked with physical therapy.  Oxygen has been weaned out.  Patient is feeling a lot better, denies any chest pain or palpitation, no shortness of breath.  Patient would like to know when he can go home.  Plan is to discontinue Foley catheter today and get clearance from oncology, possible discharge tomorrow a.m.   Physical Exam: General: NAD, lying comfortably Appear in no distress Eyes: PERRLA ENT: Oral Mucosa Clear, dry Neck: no JVD,  Cardiovascular: Irregular rhythm,  no Murmur,  Respiratory: Good air entry bilaterally, mild crackles, no wheezing appreciated.   Abdomen: Bowel Sound present, Soft and no tenderness,  Skin: no rashes Extremities: no Pedal edema, no calf tenderness Neurologic: without any new focal findings Gait not checked due to patient safety concerns  Vitals:   03/11/23 0400 03/11/23 0848 03/11/23 0918 03/11/23 1234  BP: 114/75 (!) 124/56  (!) 143/67  Pulse: 74 77 77 71  Resp: 18 18  18   Temp: 98.1 F (36.7 C) 97.8 F (36.6 C)  97.6 F (36.4 C)  TempSrc: Oral Oral  Oral  SpO2: 99% 100% 98% 98%  Weight:      Height:        Intake/Output Summary (Last 24 hours) at 03/11/2023 1434 Last data filed at 03/11/2023 1155 Gross per 24 hour  Intake 240 ml  Output 2150 ml  Net -1910 ml   Filed Weights   03/06/23 0000 03/06/23 0500 03/07/23 0500  Weight: 45 kg 45 kg 48.9 kg    Data Reviewed: I have personally reviewed and interpreted daily labs, tele strips, imagings as discussed above. I reviewed all nursing notes, pharmacy notes, vitals, pertinent old records I have discussed plan of care as described above with RN and patient/family.  CBC: Recent Labs  Lab 03/06/23 0258 03/07/23 0308 03/07/23 1628 03/08/23 0539 03/09/23 0413 03/10/23 0540 03/11/23 0542  WBC 11.3* 14.2*   --  12.1* 11.5* 11.4* 12.2*  NEUTROABS 9.2*  --   --   --   --   --   --   HGB 10.9* 10.0*  --  9.7* 10.4* 10.2* 11.1*  HCT 32.2* 31.0* 32.6* 30.4* 32.4* 32.1* 34.1*  MCV 91.5 96.9  --  97.1 95.9 95.8 94.7  PLT 309 274  --  260 281 270 263   Basic Metabolic Panel: Recent Labs  Lab 03/07/23 0308 03/07/23 0719 03/07/23 1601 03/08/23 0539 03/09/23 0413 03/10/23 0540 03/11/23 0542  NA 130*   < > 131* 134* 133* 130* 133*  K 4.5  --   --  3.7 3.9 3.8 4.2  CL 102  --   --  107 107 103 104  CO2 18*  --   --  23 22 23 23   GLUCOSE 100*  --   --  73 129* 122* 126*  BUN 43*  --   --  28* 20 16 17   CREATININE 0.89  --   --  0.82 0.77 0.72 0.76  CALCIUM 8.0*  --   --  7.7* 8.1* 7.8* 8.3*  MG 2.5*  --   --  2.3 2.2 2.2 2.0  PHOS 4.5  --   --  3.1 3.5 3.2 3.8   < > = values in this interval not displayed.    Studies: ECHOCARDIOGRAM LIMITED  Result Date: 03/11/2023    ECHOCARDIOGRAM LIMITED REPORT   Patient Name:  Cody [name]    Date of Exam: 03/11/2023 Medical Rec #:  [id number]  Height:       60.0 in Accession #:    [id number] Weight:       107.8 lb Date of Birth:  15-Jun-1946 BSA:          1.435 m Patient Age:    76 years   BP:           124/56 mmHg Patient Gender: M          HR:           77 bpm. Exam Location:  [facility] Procedure: Limited Echo, Cardiac Doppler and Color Doppler Indications:     Pericardial Effusion I31.3  History:         Patient has prior history of Echocardiogram examinations, most                  recent 03/07/2023. CHF; Risk Factors:Hypertension. Alcohol                  dependence, tobacco abuse.  Sonographer:     Cristela Blue Referring Phys:  [id number] Lyn Hollingshead PARASCHOS Diagnosing Phys: Marcina Millard MD IMPRESSIONS  1. Left ventricular ejection fraction, by estimation, is 60 to 65%. The left ventricle has normal function.  2. Mild to moderate mitral valve regurgitation.  3. Aortic valve regurgitation is mild to moderate. FINDINGS  Left Ventricle: Left ventricular ejection  fraction, by estimation, is 60 to 65%. The left ventricle has normal function. Pericardium: There is no evidence of pericardial effusion. Mitral Valve: Mild to moderate mitral valve regurgitation. Tricuspid Valve: Tricuspid valve regurgitation is mild. Aortic Valve: Aortic valve regurgitation is mild to moderate. Aortic regurgitation PHT measures 343 msec. Additional Comments: Spectral Doppler performed. Color Doppler performed.  LEFT VENTRICLE PLAX 2D LVIDd:         5.10 cm   Diastology LVIDs:         3.30 cm   LV e' medial: 5.55 cm/s LV PW:         0.80 cm LV IVS:        0.80 cm LVOT diam:     2.10 cm LVOT Area:     3.46 cm  RIGHT VENTRICLE RV Basal diam:  2.80 cm RV Mid diam:    2.60 cm TAPSE (M-mode): 1.2 cm LEFT ATRIUM             Index        RIGHT ATRIUM           Index LA diam:        3.10 cm 2.16 cm/m   RA Area:     15.90 cm LA Vol (A2C):   15.7 ml 10.94 ml/m  RA Volume:   40.70 ml  28.35 ml/m LA Vol (A4C):   18.9 ml 13.17 ml/m LA Biplane Vol: 17.5 ml 12.19 ml/m  AORTIC VALVE AI PHT:      343 msec  AORTA Ao Root diam: 3.00 cm TRICUSPID VALVE TR Peak grad:   23.4 mmHg TR Vmax:        242.00 cm/s  SHUNTS Systemic Diam: 2.10 cm Marcina Millard MD Electronically signed by Marcina Millard MD Signature Date/Time: 03/11/2023/12:51:41 PM    Final     Scheduled Meds:  amiodarone  200 mg Oral Daily   Chlorhexidine Gluconate Cloth  6 each Topical Daily   colchicine  0.6 mg Oral Daily   dexamethasone  2 mg Oral Q12H   feeding  supplement  237 mL Oral TID BM   folic acid  1 mg Oral Daily   iron polysaccharides  150 mg Oral Daily   multivitamin with minerals  1 tablet Oral Daily   polyethylene glycol  17 g Oral BID   sodium chloride flush  3 mL Intravenous Q12H   sodium chloride flush  3 mL Intravenous Q12H   tamsulosin  0.4 mg Oral Daily   thiamine  100 mg Oral Daily   Or   thiamine  100 mg Intravenous Daily   Vitamin D (Ergocalciferol)  50,000 Units Oral Q7 days   Continuous Infusions:   sodium chloride 10 mL (03/08/23 2257)   sodium chloride 1 mL/kg/hr (03/08/23 1900)   cefTRIAXone (ROCEPHIN)  IV Stopped (03/11/23 1416)   doxycycline (VIBRAMYCIN) IV Stopped (03/11/23 1317)   levETIRAcetam Stopped (03/11/23 0700)   PRN Meds: sodium chloride, acetaminophen **OR** acetaminophen, alum & mag hydroxide-simeth, bisacodyl, bisacodyl, ipratropium-albuterol, ondansetron **OR** ondansetron (ZOFRAN) IV, sodium chloride flush  Time spent: 50 minutes  Author: Gillis Santa. MD [facility] 03/11/2023 2:34 PM  To reach On-call, see care teams to locate the attending and reach out to them via [email or web address]. If 7PM-7AM, please contact night-coverage If you still have difficulty reaching the attending provider, please page the [facility] (Director on Call) for [facility] on amion for assistance.

## 2023-03-11 NOTE — Evaluation (Signed)
Occupational Therapy Evaluation Patient Details Name: Cody [name] MRN: [id number] DOB: [birth year] Today's Date: 03/11/2023   History of Present Illness Pt is a 77 y.o. male with medical history significant of hypertension, HFpEF, alcohol use disorder, BPH, who presents to the ED due to altered mental status. 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   Clinical Impression   Pt was seen for OT evaluation this date and co-tx with PT to optimize safety for ADL mobility. Prior to hospital admission, pt was independent, retired, and family drove. Pt lives with his spouse and has children who are supportive. Pt presents to acute OT demonstrating impaired ADL performance and functional mobility 2/2 decreased strength, balance, and activity tolerance (See OT problem list). Pt currently requires CGA for ADL transfers and mobility (improved balance with RW versus without), PRN MIN A for LB ADL tasks. Family able to provide level of assist. HR 110's-120's with exertion, SpO2 92-93% on room air. Pt would benefit from skilled OT services while hospitalized to address noted impairments and functional limitations (see below for any additional details) in order to maximize safety and independence while minimizing falls risk and caregiver burden.     Recommendations for follow up therapy are one component of a multi-disciplinary discharge planning process, led by the attending physician.  Recommendations may be updated based on patient status, additional functional criteria and insurance authorization.   Assistance Recommended at Discharge Frequent or constant Supervision/Assistance  Patient can return home with the following A little help with walking  and/or transfers;A little help with bathing/dressing/bathroom;Assistance with cooking/housework;Assist for transportation;Help with stairs or ramp for entrance    Functional Status Assessment  Patient has had a recent decline in their functional status and demonstrates the ability to make significant improvements in function in a reasonable and predictable amount of time.  Equipment Recommendations  None recommended by OT    Recommendations for Other Services       Precautions / Restrictions Precautions Precautions: Fall Restrictions Weight Bearing Restrictions: No Other Position/Activity Restrictions: Seizure precautions, ok to recliner per Dr. Lucianne Muss      Mobility Bed Mobility Overal bed mobility: Needs Assistance Bed Mobility: Supine to Sit     Supine to sit: Supervision     General bed mobility comments: Min extra time and effort only    Transfers Overall transfer level: Needs assistance Equipment used: Rolling walker (2 wheels) Transfers: Sit to/from Stand Sit to Stand: Min guard           General transfer comment: Min verbal cues for hand placement      Balance Overall balance assessment: Needs assistance Sitting-balance support: Feet supported Sitting balance-Leahy Scale: Good     Standing balance support: Single extremity supported, Bilateral upper extremity supported, During functional activity Standing balance-Leahy Scale: Fair                             ADL either performed or assessed with clinical judgement   ADL Overall ADL's : Needs assistance/impaired                                       General ADL Comments: Pt required MIN A for donning socks from  family, CGA + RW for ADL mobility; family reports ability to provide this level of assist at home     Vision         Perception     Praxis      Pertinent Vitals/Pain Pain Assessment Pain Assessment: No/denies pain     Hand Dominance      Extremity/Trunk Assessment Upper Extremity Assessment Upper Extremity Assessment: Generalized weakness   Lower Extremity Assessment Lower Extremity Assessment: Generalized weakness       Communication Communication Communication: Interpreter utilized;Other (comment) (family used as interpreters per patient request)   Cognition Arousal/Alertness: Awake/alert Behavior During Therapy: WFL for tasks assessed/performed Overall Cognitive Status: Within Functional Limits for tasks assessed                                       General Comments  HR 110's-120's with exertion, SpO2 92-93% on room air    Exercises     Shoulder Instructions      Home Living Family/patient expects to be discharged to:: Private residence Living Arrangements: Spouse/significant other;Children Available Help at Discharge: Family;Available 24 hours/day Type of Home: House Home Access: Level entry     Home Layout: Two level;Able to live on main level with bedroom/bathroom     Bathroom Shower/Tub: Chief Strategy Officer: Standard     Home Equipment: None          Prior Functioning/Environment Prior Level of Function : Independent/Modified Independent             Mobility Comments: Ind amb without an AD ADLs Comments: Ind with ADLs, medication; family drive        OT Problem List: Decreased strength;Impaired balance (sitting and/or standing);Decreased knowledge of use of DME or AE      OT Treatment/Interventions: Self-care/ADL training;Therapeutic exercise;Therapeutic activities;Energy conservation;Patient/family education;Balance training;DME and/or AE instruction    OT Goals(Current goals can be found in the care plan section) Acute Rehab OT Goals Patient Stated Goal: go home OT Goal Formulation: With patient/family Time For Goal Achievement: 03/25/23 Potential to Achieve Goals: Good ADL Goals Pt Will Perform Lower Body Dressing: with modified  independence;sit to/from stand Additional ADL Goal #1: Pt will complete all aspects of bathing with PRN assist from family, 2/2 opportunities. Additional ADL Goal #2: Pt/family will verbalize plan to implement at least 1 learned falls prevention strategy to maximize safety.  OT Frequency: Min 1X/week    Co-evaluation PT/OT/SLP Co-Evaluation/Treatment: Yes Reason for Co-Treatment: To address functional/ADL transfers;Necessary to address cognition/behavior during functional activity;For patient/therapist safety PT goals addressed during session: Mobility/safety with mobility;Balance;Proper use of DME OT goals addressed during session: ADL's and self-care;Proper use of Adaptive equipment and DME      AM-PAC OT "6 Clicks" Daily Activity     Outcome Measure Help from another person eating meals?: None Help from another person taking care of personal grooming?: None Help from another person toileting, which includes using toliet, bedpan, or urinal?: A Little Help from another person bathing (including washing, rinsing, drying)?: A Little Help from another person to put on and taking off regular upper body clothing?: None Help from another person to put on and taking off regular lower body clothing?: A Little 6 Click Score: 21   End of Session Equipment Utilized During Treatment: Gait belt;Rolling walker (2 wheels) Nurse Communication: Mobility status  Activity Tolerance: Patient tolerated treatment well Patient left: in chair;with  call bell/phone within reach;with chair alarm set;with family/visitor present  OT Visit Diagnosis: Muscle weakness (generalized) (M62.81);Unsteadiness on feet (R26.81)                Time: 1610-9604 OT Time Calculation (min): 31 min Charges:  OT General Charges $OT Visit: 1 Visit OT Evaluation $OT Eval Low Complexity: 1 Low OT Treatments $Therapeutic Activity: 8-22 mins  Arman Filter., MPH, MS, OTR/L ascom [phone] 03/11/23, 12:53 PM

## 2023-03-11 NOTE — Evaluation (Signed)
Physical Therapy Evaluation Patient Details Name: Cody [name] MRN: [id number] DOB: 1946-06-06 Today's Date: 03/11/2023  History of Present Illness  Pt is a 77 y.o. male with medical history significant of hypertension, HFpEF, alcohol use disorder, BPH, who presents to the ED due to altered mental status. 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   Clinical Impression  Pt was pleasant and motivated to participate during the session and put forth good effort throughout. Per chart review pt requests family to be utilized as interpreters with family present throughout the session.  Pt required no physical assistance during the session but did require some cuing for proper sequencing with mobility.  Pt was able to amb both with a RW as well as with gentle +1 HHA with some minor instability noted with each but no overt LOB.  Pt reported no adverse symptoms during the session with SpO2 in the low 90s and HR WNL throughout.  Pt will benefit from continued PT services upon discharge to safely address deficits listed in patient problem list for decreased caregiver assistance and eventual return to PLOF.         Recommendations for follow up therapy are one component of a multi-disciplinary discharge planning process, led by the attending physician.  Recommendations may be updated based on patient status, additional functional criteria and insurance authorization.  Follow Up Recommendations       Assistance Recommended at Discharge Intermittent Supervision/Assistance  Patient can return home with the following  A little help with walking and/or transfers;A little help with bathing/dressing/bathroom;Assistance with cooking/housework;Assist for  transportation    Equipment Recommendations Rolling walker (2 wheels)  Recommendations for Other Services       Functional Status Assessment Patient has had a recent decline in their functional status and demonstrates the ability to make significant improvements in function in a reasonable and predictable amount of time.     Precautions / Restrictions Precautions Precautions: Fall Restrictions Weight Bearing Restrictions: No Other Position/Activity Restrictions: Seizure precautions, ok to recliner per Dr. Lucianne Muss      Mobility  Bed Mobility Overal bed mobility: Needs Assistance Bed Mobility: Supine to Sit     Supine to sit: Supervision     General bed mobility comments: Min extra time and effort only    Transfers Overall transfer level: Needs assistance Equipment used: Rolling walker (2 wheels) Transfers: Sit to/from Stand Sit to Stand: Min guard           General transfer comment: Min verbal cues for hand placement    Ambulation/Gait Ambulation/Gait assistance: Min guard Gait Distance (Feet): 125 Feet Assistive device: Rolling walker (2 wheels), 1 person hand held assist Gait Pattern/deviations: Step-through pattern, Decreased step length - right, Decreased step length - left, Drifts right/left Gait velocity: decreased     General Gait Details: Pt able to amb 75 feet with a RW and 75 feet with +1 HHA with mild instability/drifting with each but no overt LOB  Stairs            Wheelchair Mobility    Modified Rankin (Stroke Patients Only)       Balance Overall balance assessment: Needs assistance Sitting-balance support: Feet supported Sitting balance-Leahy Scale: Good     Standing balance support: Single extremity supported, Bilateral upper extremity supported, During functional activity Standing balance-Leahy Scale: Fair  Pertinent Vitals/Pain Pain Assessment Pain Assessment: No/denies pain     Home Living Family/patient expects to be discharged to:: Private residence Living Arrangements: Spouse/significant other;Children Available Help at Discharge: Family;Available 24 hours/day Type of Home: House Home Access: Level entry       Home Layout: Two level;Able to live on main level with bedroom/bathroom Home Equipment: None      Prior Function Prior Level of Function : Independent/Modified Independent             Mobility Comments: Ind amb without an AD ADLs Comments: Ind with ADLs     Hand Dominance        Extremity/Trunk Assessment   Upper Extremity Assessment Upper Extremity Assessment: Defer to OT evaluation    Lower Extremity Assessment Lower Extremity Assessment: Generalized weakness       Communication   Communication: Interpreter utilized;Other (comment) (family used as interpreters per patient request)  Cognition Arousal/Alertness: Awake/alert Behavior During Therapy: WFL for tasks assessed/performed Overall Cognitive Status: Within Functional Limits for tasks assessed                                          General Comments      Exercises     Assessment/Plan    PT Assessment Patient needs continued PT services  PT Problem List Decreased strength;Decreased activity tolerance;Decreased balance;Decreased mobility;Decreased knowledge of use of DME       PT Treatment Interventions DME instruction;Gait training;Functional mobility training;Therapeutic activities;Therapeutic exercise;Balance training;Patient/family education    PT Goals (Current goals can be found in the Care Plan section)  Acute Rehab PT Goals Patient Stated Goal: To return home PT Goal Formulation: With patient/family Time For Goal Achievement: 03/24/23 Potential to Achieve Goals: Good    Frequency Min 2X/week     Co-evaluation PT/OT/SLP Co-Evaluation/Treatment: Yes Reason for Co-Treatment: To address functional/ADL transfers;Necessary to  address cognition/behavior during functional activity;For patient/therapist safety PT goals addressed during session: Mobility/safety with mobility;Balance;Proper use of DME         AM-PAC PT "6 Clicks" Mobility  Outcome Measure Help needed turning from your back to your side while in a flat bed without using bedrails?: None Help needed moving from lying on your back to sitting on the side of a flat bed without using bedrails?: None Help needed moving to and from a bed to a chair (including a wheelchair)?: A Little Help needed standing up from a chair using your arms (e.g., wheelchair or bedside chair)?: A Little Help needed to walk in hospital room?: A Little Help needed climbing 3-5 steps with a railing? : A Little 6 Click Score: 20    End of Session Equipment Utilized During Treatment: Gait belt Activity Tolerance: Patient tolerated treatment well Patient left: in chair;with call bell/phone within reach;with chair alarm set;with SCD's reapplied;with family/visitor present Nurse Communication: Mobility status PT Visit Diagnosis: Unsteadiness on feet (R26.81);Difficulty in walking, not elsewhere classified (R26.2);Muscle weakness (generalized) (M62.81)    Time: 4401-0272 PT Time Calculation (min) (ACUTE ONLY): 30 min   Charges:   PT Evaluation $PT Eval Moderate Complexity: 1 Mod         D. Scott Toshi Ishii PT, DPT 03/11/23, 11:55 AM

## 2023-03-11 NOTE — Progress Notes (Signed)
SUBJECTIVE: Cody [name] is a 77yo [place] male with a PMH of HFpEF, alcohol use disorder, ongoing tobacco abuse, HTN who presented to [facility] ED 03/05/2023 with altered mental status (speaking nonsensically and having difficulty with his memory), generalized weakness, shortness of breath, and left-sided chest discomfort.  He was admitted to stepdown and head CT obtained showed abnormal vasogenic edema in the right posterior frontal region and left occipital lobe worrisome for mass lesions.  CT chest abdomen pelvis revealed a large pericardial effusion, confirmed by echo this morning to be a large circumferential pericardial effusion with findings consistent with cardiac tamponade.    Patient underwent urgent pericardiocentesis by Dr. Darrold Junker and pleural fluid analysis on 03/06/23. Patient developed atrial fibrillation with slow ventricular response post procedure.    Limited echo 03/07/23 showed moderate pericardial effusion with significant reduction post pericardiocentesis without RA or RV diastolic collapse or evidence of pericardial tamponade. [facility] cardiology following with additional bloody fluid aspirated from pigtail catheter on 03/07/23. Patient underwent thoracentesis on 03/07/23.   Pigtail catheter removed 03/08/23 by Dr. Darrold Junker.    Vitals:   03/10/23 2000 03/10/23 2356 03/11/23 0400 03/11/23 0848  BP: 122/67 124/62 114/75 (!) 124/56  Pulse: 80 72 74 77  Resp: 17 16 18 18   Temp: 98.4 F (36.9 C) 98.4 F (36.9 C) 98.1 F (36.7 C) 97.8 F (36.6 C)  TempSrc: Oral Oral Oral Oral  SpO2: 97% 100% 99% 100%  Weight:      Height:        Intake/Output Summary (Last 24 hours) at 03/11/2023 0851 Last data filed at 03/11/2023 0400 Gross per 24 hour  Intake 0 ml  Output 2200 ml  Net -2200 ml    LABS: Basic Metabolic Panel: Recent Labs    03/10/23 0540 03/11/23 0542  NA 130* 133*  K 3.8 4.2  CL 103 104  CO2 23 23  GLUCOSE 122* 126*  BUN 16 17  CREATININE 0.72 0.76  CALCIUM 7.8* 8.3*  MG  2.2 2.0  PHOS 3.2 3.8   Liver Function Tests: Recent Labs    03/10/23 0540 03/11/23 0542  AST 76* 59*  ALT 100* 88*  ALKPHOS 59 64  BILITOT 0.6 0.7  PROT 5.4* 5.3*  ALBUMIN 2.1* 2.0*   No results for input(s): "LIPASE", "AMYLASE" in the last 72 hours. CBC: Recent Labs    03/10/23 0540 03/11/23 0542  WBC 11.4* 12.2*  HGB 10.2* 11.1*  HCT 32.1* 34.1*  MCV 95.8 94.7  PLT 270 263   Cardiac Enzymes: No results for input(s): "CKTOTAL", "CKMB", "CKMBINDEX", "TROPONINI" in the last 72 hours. BNP: Invalid input(s): "POCBNP" D-Dimer: No results for input(s): "DDIMER" in the last 72 hours. Hemoglobin A1C: No results for input(s): "HGBA1C" in the last 72 hours. Fasting Lipid Panel: No results for input(s): "CHOL", "HDL", "LDLCALC", "TRIG", "CHOLHDL", "LDLDIRECT" in the last 72 hours. Thyroid Function Tests: No results for input(s): "TSH", "T4TOTAL", "T3FREE", "THYROIDAB" in the last 72 hours.  Invalid input(s): "FREET3" Anemia Panel: Recent Labs    03/09/23 0413 03/10/23 0540  VITAMINB12  --  955*  FOLATE 11.8  --   TIBC 239*  --   IRON 41*  --      PHYSICAL EXAM General: no acute distress HEENT:  Normocephalic and atramatic Neck:  No JVD.  Lungs: Clear bilaterally to auscultation and percussion. Heart: HRRR . Normal S1 and S2 without gallops or murmurs.  Abdomen: Bowel sounds are positive, abdomen soft and non-tender  Msk:  Back normal, normal gait.  Normal strength and tone for age. Extremities: No clubbing, cyanosis or edema.   Psych:  Good affect  TELEMETRY: sinus rhythm, HR 76 bpm  ASSESSMENT AND PLAN: Patient resting comfortably in bed. Denies chest pain, shortness of breath. Amiodarone 200 mg started 3 days ago, patient now in sinus rhythm. Blood thinner contraindicated at this time due to hemorrhagic pericardial effusion. Repeat limited echo today, results pending. Will continue to follow.    ICD-10-CM   1. Acute hyponatremia  E87.1     2. Vasogenic  edema (HCC)  G93.6     3. Pneumonia of left lower lobe due to infectious organism  J18.9       Principal Problem:   Acute encephalopathy Active Problems:   Congestive heart failure (HCC)   Chronic obstructive pulmonary disease (HCC)   Acute hyponatremia   Pericardial effusion   Vasogenic edema (HCC)   Brain mass   Pleural effusion, bilateral   Alcohol use disorder   BPH (benign prostatic hyperplasia)   Elevated LFTs   Pneumonia of left lower lobe due to infectious organism   Metastasis to brain [facility])   Metastatic non-small cell lung cancer (HCC)   Goals of care, counseling/discussion    Cody Mcshan, FNP-C 03/11/2023 8:51 AM

## 2023-03-12 ENCOUNTER — Telehealth: Payer: Self-pay

## 2023-03-12 ENCOUNTER — Encounter: Payer: Self-pay | Admitting: *Deleted

## 2023-03-12 ENCOUNTER — Other Ambulatory Visit: Payer: Self-pay | Admitting: Family

## 2023-03-12 DIAGNOSIS — I509 Heart failure, unspecified: Secondary | ICD-10-CM | POA: Diagnosis not present

## 2023-03-12 DIAGNOSIS — G9340 Encephalopathy, unspecified: Secondary | ICD-10-CM | POA: Diagnosis not present

## 2023-03-12 DIAGNOSIS — Z7189 Other specified counseling: Secondary | ICD-10-CM | POA: Diagnosis not present

## 2023-03-12 LAB — BASIC METABOLIC PANEL
Anion gap: 6 (ref 5–15)
BUN: 19 mg/dL (ref 8–23)
CO2: 25 mmol/L (ref 22–32)
Calcium: 8.2 mg/dL — ABNORMAL LOW (ref 8.9–10.3)
Chloride: 100 mmol/L (ref 98–111)
Creatinine, Ser: 0.68 mg/dL (ref 0.61–1.24)
GFR, Estimated: >60 mL/min (ref >60)
Glucose, Bld: 94 mg/dL (ref 70–99)
Potassium: 4.2 mmol/L (ref 3.5–5.1)
Sodium: 131 mmol/L — ABNORMAL LOW (ref 135–145)

## 2023-03-12 LAB — CBC
HCT: 34.5 % — ABNORMAL LOW (ref 39.0–52.0)
Hemoglobin: 11.1 g/dL — ABNORMAL LOW (ref 13.0–17.0)
MCH: 30.4 pg (ref 26.0–34.0)
MCHC: 32.2 g/dL (ref 30.0–36.0)
MCV: 94.5 fL (ref 80.0–100.0)
Platelets: 267 10*3/uL (ref 150–400)
RBC: 3.65 MIL/uL — ABNORMAL LOW (ref 4.22–5.81)
RDW: 13.7 % (ref 11.5–15.5)
WBC: 12.6 10*3/uL — ABNORMAL HIGH (ref 4.0–10.5)
nRBC: 0 % (ref 0.0–0.2)

## 2023-03-12 LAB — PHOSPHORUS: Phosphorus: 4.4 mg/dL (ref 2.5–4.6)

## 2023-03-12 LAB — HEPATIC FUNCTION PANEL
ALT: 106 U/L — ABNORMAL HIGH (ref 0–44)
AST: 75 U/L — ABNORMAL HIGH (ref 15–41)
Albumin: 2.2 g/dL — ABNORMAL LOW (ref 3.5–5.0)
Alkaline Phosphatase: 66 U/L (ref 38–126)
Bilirubin, Direct: <0.1 mg/dL (ref 0.0–0.2)
Total Bilirubin: 0.5 mg/dL (ref 0.3–1.2)
Total Protein: 5.4 g/dL — ABNORMAL LOW (ref 6.5–8.1)

## 2023-03-12 LAB — CYTOLOGY - NON PAP

## 2023-03-12 LAB — MAGNESIUM: Magnesium: 1.9 mg/dL (ref 1.7–2.4)

## 2023-03-12 MED ORDER — WHEEL CHAIR K1 BASIC DESK ARM MISC: 1.0000 | Freq: Once | 0 refills | Status: AC

## 2023-03-12 MED ORDER — VITAMIN D (ERGOCALCIFEROL) 1.25 MG (50000 UNIT) PO CAPS: 50000.0000 [IU] | ORAL_CAPSULE | ORAL | 0 refills | Status: DC

## 2023-03-12 MED ORDER — FOLIC ACID 1 MG PO TABS: 1.0000 mg | ORAL_TABLET | Freq: Every day | ORAL | 2 refills | Status: DC

## 2023-03-12 MED ORDER — POLYETHYLENE GLYCOL 3350 17 G PO PACK: 17.0000 g | PACK | Freq: Every day | ORAL | 0 refills | Status: DC | PRN

## 2023-03-12 MED ORDER — COLCHICINE 0.6 MG PO TABS: 0.6000 mg | ORAL_TABLET | Freq: Every day | ORAL | 2 refills | Status: DC

## 2023-03-12 MED ORDER — VITAMIN B-1 100 MG PO TABS: 100.0000 mg | ORAL_TABLET | Freq: Every day | ORAL | 2 refills | Status: DC

## 2023-03-12 MED ORDER — AMIODARONE HCL 200 MG PO TABS: 200.0000 mg | ORAL_TABLET | Freq: Every day | ORAL | 5 refills | Status: DC

## 2023-03-12 MED ORDER — DEXAMETHASONE 2 MG PO TABS: 2.0000 mg | ORAL_TABLET | Freq: Two times a day (BID) | ORAL | 5 refills | Status: DC

## 2023-03-12 MED ORDER — ADULT MULTIVITAMIN W/MINERALS CH: 1.0000 | ORAL_TABLET | Freq: Every day | ORAL | 2 refills | Status: DC

## 2023-03-12 MED ORDER — POLYSACCHARIDE IRON COMPLEX 150 MG PO CAPS: 150.0000 mg | ORAL_CAPSULE | Freq: Every day | ORAL | 2 refills | Status: DC

## 2023-03-12 MED ORDER — LEVETIRACETAM 500 MG PO TABS: 500.0000 mg | ORAL_TABLET | Freq: Two times a day (BID) | ORAL | 5 refills | Status: DC

## 2023-03-12 MED ORDER — CEFUROXIME AXETIL 500 MG PO TABS: 500.0000 mg | ORAL_TABLET | Freq: Two times a day (BID) | ORAL | 0 refills | Status: AC

## 2023-03-12 MED ORDER — DOXYCYCLINE HYCLATE 100 MG PO TABS: 100.0000 mg | ORAL_TABLET | Freq: Two times a day (BID) | ORAL | 0 refills | Status: AC

## 2023-03-12 MED ORDER — FINASTERIDE 5 MG PO TABS: 5.0000 mg | ORAL_TABLET | Freq: Every day | ORAL | 3 refills | Status: DC

## 2023-03-12 MED ORDER — TAMSULOSIN HCL 0.4 MG PO CAPS: 0.4000 mg | ORAL_CAPSULE | Freq: Every day | ORAL | 3 refills | Status: DC

## 2023-03-12 MED ORDER — ALLOPURINOL 100 MG PO TABS: 100.0000 mg | ORAL_TABLET | Freq: Every day | ORAL | 11 refills | Status: DC

## 2023-03-12 NOTE — Telephone Encounter (Signed)
[facility] NGS and (xT & xR with PD-L1 22C3 IHC requested) on [id number], on pericardial fluid, collected 03/06/2023.

## 2023-03-12 NOTE — Progress Notes (Signed)
DC instructions, med list, f/u appts reviewed with pt and daughter Irving Burton (2 other family members at the bedside as well).  Denied need for interpreter as daughter Irving Burton is in charge of pt's meds and appts.  Walker delivered as pt was exiting the room.  Discharged via wheelchair with volunteer services without incident

## 2023-03-12 NOTE — Progress Notes (Signed)
SUBJECTIVE: Cody [name] is a 77yo [place] male with a PMH of HFpEF, alcohol use disorder, ongoing tobacco abuse, HTN who presented to [facility] ED 03/05/2023 with altered mental status (speaking nonsensically and having difficulty with his memory), generalized weakness, shortness of breath, and left-sided chest discomfort.  He was admitted to stepdown and head CT obtained showed abnormal vasogenic edema in the right posterior frontal region and left occipital lobe worrisome for mass lesions.  CT chest abdomen pelvis revealed a large pericardial effusion, confirmed by echo this morning to be a large circumferential pericardial effusion with findings consistent with cardiac tamponade.    Patient underwent urgent pericardiocentesis by Dr. Darrold Junker and pleural fluid analysis on 03/06/23. Patient developed atrial fibrillation with slow ventricular response post procedure.    Limited echo 03/07/23 showed moderate pericardial effusion with significant reduction post pericardiocentesis without RA or RV diastolic collapse or evidence of pericardial tamponade. [facility] cardiology following with additional bloody fluid aspirated from pigtail catheter on 03/07/23. Patient underwent thoracentesis on 03/07/23.   Pigtail catheter removed 03/08/23 by Dr. Darrold Junker.   Echocardiogram 03/11/23 showed no evidence of a pericardial effusion.    Vitals:   03/11/23 2346 03/12/23 0500 03/12/23 0616 03/12/23 0809  BP: (!) 147/66 (!) 137/59  138/67  Pulse: 85 81  75  Resp: 18 16  18   Temp: 98 F (36.7 C) 98.2 F (36.8 C)  (!) 97.5 F (36.4 C)  TempSrc: Oral Oral    SpO2: 97% 99%  99%  Weight:   50.1 kg   Height:        Intake/Output Summary (Last 24 hours) at 03/12/2023 0859 Last data filed at 03/12/2023 1610 Gross per 24 hour  Intake 240 ml  Output 1650 ml  Net -1410 ml    LABS: Basic Metabolic Panel: Recent Labs    03/11/23 0542 03/12/23 0426  NA 133* 131*  K 4.2 4.2  CL 104 100  CO2 23 25  GLUCOSE 126* 94  BUN 17 19   CREATININE 0.76 0.68  CALCIUM 8.3* 8.2*  MG 2.0 1.9  PHOS 3.8 4.4   Liver Function Tests: Recent Labs    03/11/23 0542 03/12/23 0426  AST 59* 75*  ALT 88* 106*  ALKPHOS 64 66  BILITOT 0.7 0.5  PROT 5.3* 5.4*  ALBUMIN 2.0* 2.2*   No results for input(s): "LIPASE", "AMYLASE" in the last 72 hours. CBC: Recent Labs    03/11/23 0542 03/12/23 0426  WBC 12.2* 12.6*  HGB 11.1* 11.1*  HCT 34.1* 34.5*  MCV 94.7 94.5  PLT 263 267   Cardiac Enzymes: No results for input(s): "CKTOTAL", "CKMB", "CKMBINDEX", "TROPONINI" in the last 72 hours. BNP: Invalid input(s): "POCBNP" D-Dimer: No results for input(s): "DDIMER" in the last 72 hours. Hemoglobin A1C: No results for input(s): "HGBA1C" in the last 72 hours. Fasting Lipid Panel: No results for input(s): "CHOL", "HDL", "LDLCALC", "TRIG", "CHOLHDL", "LDLDIRECT" in the last 72 hours. Thyroid Function Tests: No results for input(s): "TSH", "T4TOTAL", "T3FREE", "THYROIDAB" in the last 72 hours.  Invalid input(s): "FREET3" Anemia Panel: Recent Labs    03/10/23 0540  VITAMINB12 955*     PHYSICAL EXAM General: Well developed, well nourished, in no acute distress HEENT:  Normocephalic and atramatic Neck:  No JVD.  Lungs: Clear bilaterally to auscultation and percussion. Heart: HRRR . Normal S1 and S2 without gallops or murmurs.  Abdomen: Bowel sounds are positive, abdomen soft and non-tender  Msk:  Back normal, normal gait. Normal strength and tone for age. Extremities:  No clubbing, cyanosis or edema.   Neuro: Alert and oriented X 3. Psych:  Good affect, responds appropriately  TELEMETRY: sinus rhythm, HR 76 bpm  ASSESSMENT AND PLAN: Patient resting comfortably in bed. Translation per daughter, denies chest pain, shortness of breath. Amiodarone 200 mg started 4 days ago, patient now in sinus rhythm. Blood thinner contraindicated at this time due to hemorrhagic pericardial effusion. Repeat limited echo showed no evidence of a  pericardial effusion. Patient can bed discharged home today with follow up in office in one week.    ICD-10-CM   1. Acute hyponatremia  E87.1     2. Vasogenic edema (HCC)  G93.6     3. Pneumonia of left lower lobe due to infectious organism  J18.9       Principal Problem:   Acute encephalopathy Active Problems:   Congestive heart failure (HCC)   Chronic obstructive pulmonary disease (HCC)   Acute hyponatremia   Pericardial effusion   Vasogenic edema (HCC)   Brain mass   Pleural effusion, bilateral   Alcohol use disorder   BPH (benign prostatic hyperplasia)   Elevated LFTs   Pneumonia of left lower lobe due to infectious organism   Metastasis to brain [facility])   Metastatic non-small cell lung cancer (HCC)   Goals of care, counseling/discussion    Welden Hausmann, FNP-C 03/12/2023 8:59 AM

## 2023-03-12 NOTE — Progress Notes (Signed)
Spoke with pt's daughter, Irving Burton.  She will be here around 1400 for pt's discharge.  Will review paperwork at that time.  Tele and IV removed at this time

## 2023-03-12 NOTE — TOC Transition Note (Signed)
Transition of Care [facility]) - CM/SW Discharge Note   Patient Details  Name: Sarkis Sollazzo MRN: [id number] Date of Birth: 1946/03/02  Transition of Care [facility]) CM/SW Contact:  Truddie Hidden, RN Phone Number: 03/12/2023, 1:41 PM   Clinical Narrative:    Spoke with patient's daughter, Irving Burton regarding discharge plan for home today.  She was advised HH PT was recommended for patient. She is agreeable to services and does not have a preference  Referral made and accepted by [facility]  Request for RW and WC made to [place] from [employer].  TOC signing off.          Patient Goals and CMS Choice      Discharge Placement                         Discharge Plan and Services Additional resources added to the After Visit Summary for                                       Social Determinants of Health (SDOH) Interventions SDOH Screenings   Transportation Needs: Unknown (08/25/2018)  Financial Resource Strain: Low Risk  (08/25/2018)  Stress: No Stress Concern Present (08/25/2018)  Tobacco Use: High Risk (03/06/2023)     Readmission Risk Interventions     No data to display

## 2023-03-12 NOTE — Discharge Summary (Signed)
[facility] Discharge Summary   Patient: Bhupinder Plagens ZOX:[id number]  PCP: Orson Eva, NP  Date of admission: 03/05/2023   Date of discharge:  03/12/2023     Discharge Diagnoses:  Principal Problem:   Acute encephalopathy Active Problems:   Vasogenic edema (HCC)   Brain mass   Acute hyponatremia   Pericardial effusion   Pleural effusion, bilateral   Congestive heart failure (HCC)   Chronic obstructive pulmonary disease (HCC)   Alcohol use disorder   BPH (benign prostatic hyperplasia)   Elevated LFTs   Pneumonia of left lower lobe due to infectious organism   Metastasis to brain [facility])   Metastatic non-small cell lung cancer (HCC)   Goals of care, counseling/discussion   Admitted From: Home Disposition:  Home   Recommendations for Outpatient Follow-up:  Follow with PCP in 1 week, repeat imaging for resolution of pneumonia and follow-up for lung cancer.  CBC, LFTs and BMP after 1 to 2 weeks  Follow with medical oncologist and radiation oncologist in 1 week Follow up LABS/TEST:  CBC, LFTs and BMP after 1 to 2 weeks.  Chest x-ray after 4 weeks.  PET scan as an outpatient.   Diet recommendation: Regular diet  Activity: The patient is advised to gradually reintroduce usual activities, as tolerated  Discharge Condition: stable  Code Status: Full code   History of present illness: As per the H and P dictated on admission Hospital Course:  Mary Yatsko is a 77 y.o. male with medical history significant of hypertension, HFpEF, alcohol use disorder, BPH, who presents to the ED due to altered mental status.  Patient's son stated that he is having shortness of breath for past 1 week productive cough.  For past 2 to 3 days patient is talking randomly which does not make any sense and having memory issues.  Patient drinks 10 beers per day and active smoker. ED w/up: VS stable Hyponatremia sodium 117, mild acidosis CO2 18, mild hyperglycemia blood glucose 154, elevated AST and ALT,  leukocytosis WBC 13.2 CXR:  Cardiomegaly with diffuse interstitial edema pattern. Left lower lobe airspace consolidation concerning for superimposed pneumonia. Suspect small effusions. CT head:  old CVA and vasogenic edema MRI brain: . Multiple contrast-enhancing lesions in the right frontal lobe, left occipital lobe, and superior right cerebellum with surrounding vasogenic edema. 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 Small dural-based contrast-enhancing lesion along the left frontal convexity could represent a small meningimoa, but metastatic disease is not excluded. CT Chest/a/p: Large pericardial effusion. Please correlate with and new symptoms including for tamponade.   Moderate right and small left effusion with some loculated components on the left. Adjacent parenchymal opacities. Atelectasis versus infiltrate. Recommend follow up after clearance of the effusions. Few borderline enlarged lymph nodes identified including mediastinum, upper retroperitoneum. Enlarged prostate with mass effect along the bladder with bladder wall thickening. Please correlate with patient's PSA. No bowel obstruction or free air. There is scattered mesenteric haziness with trace ascites. There are some small areas of wall thickening along the colon but this could simply relate to the other signs of edema.   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  Seen by oncologist, recommended NGS testing with PD-L1 staining. dose reduced chemotherapy +/- immunotherapy if patient has no treatable mutations.  Updated PET scan as an outpatient.  Family does not wanted to inform patient at this time, wanted to take time and tell  patient themselves.  Palliative care services were offered. # Metastatic brain lesions with vasogenic edema Started Decadron, changed Decadron to 2 mg p.o.  twice daily and started Keppra 500 mg p.o. twice daily.  Patient was seen by neurosurgery, recommended brain biopsy if no diagnosis by cytology.  Recommended to follow as an outpatient.  No any other intervention 5/9 CT Head: Unchanged edema associated with known lesions in the right frontal lobe, left occipital lobe, and right cerebellum. No significant mass effect. No evidence of a new intracranial abnormality.  # Malignant pericardial and pleural effusion.  Cardiology was consulted, pericardiocentesis was done.  1.1 L fluid was tapped and sent for cytology.  Another 200 cc pericardial fluid was drained on 5/9, pigtail catheter was removed on 5/10.  2D echocardiogram was repeated on 5/13, showed no evidence of pericardial effusion.  LVEF 60 to 65%.  Patient was cleared by cardiology to discharge and follow-up as an outpatient.  Pericardial effusion, right thoracentesis was done by pulmonary critical care and fluid was sent for cytology. # Acute metabolic encephalopathy secondary to metastatic brain lesions and vasogenic edema, along with hyponatremia.  Mental status gradually improved, currently back to his baseline.  Encephalopathy resolved. # Acute onset A-fib with controlled ventricular rate, developed after pericardiocentesis Cardiology was consulted.  Anticoagulation was deferred due to metastatic adenocarcinoma brain mets and pericardial effusion.  Patient was started on midodrine 20 mg p.o. daily # Isotonic hyponatremia, Serum osmolality 276 at lower end. Hyponatremia most likely secondary to alcohol use disorder and possible SIADH due to underlying malignancy Sodium level 117 on admission. S/p 3% saline, Na 130--134  --131 fluctuating.  Nephrology help appreciated.  Patient was advised to control fluid intake 1.5 L/day.  Repeat BMP after 1 week. # History of gout, home meds colchicine and allopurinol, seems patient is noncompliant. Uric acid 9.7, elevated.  Resumed colchicine 0.6 mg p.o. daily  during hospital stay and resumed allopurinol on discharge.  # CAD, HTN and chronic diastolic CHF, Patient was on aspirin, Coreg, Entresto, statin, med rec shows patient is not taking medication, seems noncompliant. Discontinued all home medications.  Patient was advised to follow with PCP and cardiology as an outpatient. # COPD, possible underlying pneumonia. S/p empiric antibiotics ceftriaxone and doxycycline. Continue Decadron. S/p DuoNeb as needed.  Patient was discharged on Ceftin 500 mg p.o. twice daily and doxycycline 100 mg p.o. twice daily for 3 days # Alcohol use disorder:Long-term history of alcohol use disorder that is currently active.  No evidence of withdrawal on exam at this time. S/p CIWA protocol with Ativan as needed.  No withdrawal symptoms during hospital stay.  Patient was discharged on multivitamin, folic acid and thiamine for 30 days.  # BPH and urinary retention, Foley catheter was inserted on 5/8, 620 mL urine retention as per bladder scan. Continue Flomax and finasteride. On 5/13 Foley catheter was discontinued, patient passed voiding trial.  # Elevated LFTs most likely secondary to alcohol use disorder, LFTs gradually improving.  Repeat LFTs after 1 to 2 weeks. # Iron deficiency anemia, iron level is slightly low.  Started oral iron supplement folate within normal range and B12 within normal range # Vitamin D deficiency: started vitamin D 50,000 units p.o. weekly, follow with PCP to repeat vitamin D level after 3 to 6 months.  Body mass index is 21.57 kg/m.  Nutrition Interventions:   - Patient was instructed, not to drive, operate heavy machinery, perform activities at heights, swimming or participation in water activities or  provide baby sitting services while on Pain, Sleep and Anxiety Medications; until his outpatient Physician has advised to Tyrez so again.  - Also recommended to not to take more than prescribed Pain, Sleep and Anxiety Medications.  Patient was  ambulatory without any assistance. On the day of the discharge the patient's vitals were stable, and no other acute medical condition were reported by patient. the patient was felt safe to be discharge at [facility] services.  Consultants: Pulmonary critical care, cardiologist, nephrologist, oncologist, palliative care Procedures: s/p pericardiocentesis and right thoracentesis  Discharge Exam: General: Appear in no distress, no Rash; Oral Mucosa Clear, moist. Cardiovascular: S1 and S2 Present, no Murmur, Respiratory: normal respiratory effort, Bilateral Air entry present and no Crackles, no wheezes Abdomen: Bowel Sound present, Soft and no tenderness, no hernia Extremities: no Pedal edema, no calf tenderness Neurology: alert and oriented to time, place, and person affect appropriate.  Filed Weights   03/06/23 0500 03/07/23 0500 03/12/23 0616  Weight: 45 kg 48.9 kg 50.1 kg   Vitals:   03/12/23 0500 03/12/23 0809  BP: (!) 137/59 138/67  Pulse: 81 75  Resp: 16 18  Temp: 98.2 F (36.8 C) (!) 97.5 F (36.4 C)  SpO2: 99% 99%    DISCHARGE MEDICATION: Allergies as of 03/12/2023       Reactions   Shellfish Allergy Hives   Only allergic to crabs        Medication List     STOP taking these medications    aspirin EC 81 MG tablet   atorvastatin 80 MG tablet Commonly known as: LIPITOR   carvedilol 25 MG tablet Commonly known as: COREG   Entresto 24-26 MG Generic drug: sacubitril-valsartan   ezetimibe 10 MG tablet Commonly known as: ZETIA       TAKE these medications    allopurinol 100 MG tablet Commonly known as: ZYLOPRIM Take 1 tablet (100 mg total) by mouth daily.   amiodarone 200 MG tablet Commonly known as: PACERONE Take 1 tablet (200 mg total) by mouth daily. Start taking on: Mar 13, 2023   cefUROXime 500 MG tablet Commonly known as: CEFTIN Take 1 tablet (500 mg total) by mouth 2 (two) times daily with a meal for 3 days.   colchicine 0.6 MG  tablet Take 1 tablet (0.6 mg total) by mouth daily. What changed:  how much to take when to take this   dexamethasone 2 MG tablet Commonly known as: DECADRON Take 1 tablet (2 mg total) by mouth every 12 (twelve) hours.   doxycycline 100 MG tablet Commonly known as: VIBRA-TABS Take 1 tablet (100 mg total) by mouth every 12 (twelve) hours for 3 days.   finasteride 5 MG tablet Commonly known as: PROSCAR Take 1 tablet (5 mg total) by mouth daily.   folic acid 1 MG tablet Commonly known as: FOLVITE Take 1 tablet (1 mg total) by mouth daily. Start taking on: Mar 13, 2023   iron polysaccharides 150 MG capsule Commonly known as: NIFEREX Take 1 capsule (150 mg total) by mouth daily. Start taking on: Mar 13, 2023   levETIRAcetam 500 MG tablet Commonly known as: KEPPRA Take 1 tablet (500 mg total) by mouth 2 (two) times daily.   multivitamin with minerals Tabs tablet Take 1 tablet by mouth daily. Start taking on: Mar 13, 2023   polyethylene glycol 17 g packet Commonly known as: MIRALAX / GLYCOLAX Take 17 g by mouth daily as needed for mild constipation or moderate constipation.   tamsulosin  0.4 MG Caps capsule Commonly known as: FLOMAX Take 1 capsule (0.4 mg total) by mouth daily.   thiamine 100 MG tablet Commonly known as: Vitamin B-1 Take 1 tablet (100 mg total) by mouth daily. Start taking on: Mar 13, 2023   Vitamin D (Ergocalciferol) 1.25 MG (50000 UNIT) Caps capsule Commonly known as: DRISDOL Take 1 capsule (50,000 Units total) by mouth every 7 (seven) days. Start taking on: Mar 17, 2023               Durable Medical Equipment  (From admission, onward)           Start     Ordered   03/11/23 1153  For home use only DME Walker rolling  Once       Question Answer Comment  Walker: With 5 Inch Wheels   Patient needs a walker to treat with the following condition Difficulty walking      03/11/23 1153           Allergies  Allergen Reactions    Shellfish Allergy Hives    Only allergic to crabs   Discharge Instructions     Call MD for:  difficulty breathing, headache or visual disturbances   Complete by: As directed    Call MD for:  extreme fatigue   Complete by: As directed    Call MD for:  persistant dizziness or light-headedness   Complete by: As directed    Call MD for:  persistant nausea and vomiting   Complete by: As directed    Call MD for:  severe uncontrolled pain   Complete by: As directed    Call MD for:  temperature >100.4   Complete by: As directed    Diet general   Complete by: As directed    Discharge instructions   Complete by: As directed    Follow with PCP in 1 week, repeat imaging for resolution of pneumonia and follow-up for lung cancer.  Repeat CBC, LFTs and BMP after 1 to 2 weeks  Follow with medical oncologist and radiation oncologist in 1 week   Increase activity slowly   Complete by: As directed        The results of significant diagnostics from this hospitalization (including imaging, microbiology, ancillary and laboratory) are listed below for reference.    Significant Diagnostic Studies: ECHOCARDIOGRAM LIMITED  Result Date: 03/11/2023    ECHOCARDIOGRAM LIMITED REPORT   Patient Name:   Bretton Ostrow    Date of Exam: 03/11/2023 Medical Rec #:  [id number]  Height:       60.0 in Accession #:    [id number] Weight:       107.8 lb Date of Birth:  January 29, 1946 BSA:          1.435 m Patient Age:    76 years   BP:           124/56 mmHg Patient Gender: M          HR:           77 bpm. Exam Location:  [facility] Procedure: Limited Echo, Cardiac Doppler and Color Doppler Indications:     Pericardial Effusion I31.3  History:         Patient has prior history of Echocardiogram examinations, most                  recent 03/07/2023. CHF; Risk Factors:Hypertension. Alcohol  dependence, tobacco abuse.  Sonographer:     Cristela Blue Referring Phys:  [id number] Lyn Hollingshead PARASCHOS Diagnosing Phys: Marcina Millard MD  IMPRESSIONS  1. Left ventricular ejection fraction, by estimation, is 60 to 65%. The left ventricle has normal function.  2. Mild to moderate mitral valve regurgitation.  3. Aortic valve regurgitation is mild to moderate. FINDINGS  Left Ventricle: Left ventricular ejection fraction, by estimation, is 60 to 65%. The left ventricle has normal function. Pericardium: There is no evidence of pericardial effusion. Mitral Valve: Mild to moderate mitral valve regurgitation. Tricuspid Valve: Tricuspid valve regurgitation is mild. Aortic Valve: Aortic valve regurgitation is mild to moderate. Aortic regurgitation PHT measures 343 msec. Additional Comments: Spectral Doppler performed. Color Doppler performed.  LEFT VENTRICLE PLAX 2D LVIDd:         5.10 cm   Diastology LVIDs:         3.30 cm   LV e' medial: 5.55 cm/s LV PW:         0.80 cm LV IVS:        0.80 cm LVOT diam:     2.10 cm LVOT Area:     3.46 cm  RIGHT VENTRICLE RV Basal diam:  2.80 cm RV Mid diam:    2.60 cm TAPSE (M-mode): 1.2 cm LEFT ATRIUM             Index        RIGHT ATRIUM           Index LA diam:        3.10 cm 2.16 cm/m   RA Area:     15.90 cm LA Vol (A2C):   15.7 ml 10.94 ml/m  RA Volume:   40.70 ml  28.35 ml/m LA Vol (A4C):   18.9 ml 13.17 ml/m LA Biplane Vol: 17.5 ml 12.19 ml/m  AORTIC VALVE AI PHT:      343 msec  AORTA Ao Root diam: 3.00 cm TRICUSPID VALVE TR Peak grad:   23.4 mmHg TR Vmax:        242.00 cm/s  SHUNTS Systemic Diam: 2.10 cm Marcina Millard MD Electronically signed by Marcina Millard MD Signature Date/Time: 03/11/2023/12:51:41 PM    Final    EEG adult  Result Date: 03/07/2023 Jefferson Fuel, MD     03/08/2023  3:10 PM Routine EEG Report Nathan Stach is a 76 y.o. male with a history of altered mental status who is undergoing an EEG to evaluate for seizures. Report: This EEG was acquired with electrodes placed according to the International 10-20 electrode system (including Fp1, Fp2, F3, F4, C3, C4, P3, P4, O1, O2, T3, T4, T5,  T6, A1, A2, Fz, Cz, Pz). The following electrodes were missing or displaced: none. The occipital dominant rhythm was 4-6 Hz. This activity is reactive to stimulation. There are runs of faster activity most prominent over the left frontal region that are favored to be arousals. Drowsiness was manifested by background fragmentation; deeper stages of sleep were identified by K complexes and sleep spindles. There iws focal slowing over the left frontal region. There were occasional L occipital sharp waves. There were no electrographic seizures identified. Photic stimulation and hyperventilation were not performed. Impression and clinical correlation: This EEG was obtained while awake and asleep and is abnormal due to: - moderate diffuse slowing indicative of global cerebral dysfunction - left focal slowing indicative of superimposed focal cerebral dysfunction in that region - sharp waves in the left occipital region indicating increased epileptogenicity in that region  No electrographic seizures were captured during this recording. Bing Neighbors, MD [facility] [phone] If 7pm- 7am, please page neurology on call as listed in AMION.   DG Chest Port 1 View  Result Date: 03/07/2023 CLINICAL DATA:  Status post right thoracentesis EXAM: PORTABLE CHEST 1 VIEW COMPARISON:  Chest radiograph dated 03/07/2023 FINDINGS: Lines/tubes: Partially imaged inferior approach catheter projects over the left upper quadrant. Chest: Improved lung aeration with persistent left-greater-than-right interstitial and patchy opacities. Pleura: Decreased right pleural effusion. Persistent moderate left pleural effusion. No pneumothorax. Heart/mediastinum: Similar enlarged cardiomediastinal silhouette. Bones: No acute osseous abnormality. IMPRESSION: 1. Decreased right pleural effusion status post thoracentesis. No pneumothorax. 2. Persistent moderate left pleural effusion. 3. Improved lung aeration with persistent  left-greater-than-right interstitial and patchy opacities. Electronically Signed   By: Agustin Cree M.D.   On: 03/07/2023 16:35   ECHOCARDIOGRAM LIMITED  Result Date: 03/07/2023    ECHOCARDIOGRAM LIMITED REPORT   Patient Name:   Pasqualino Alonzo    Date of Exam: 03/07/2023 Medical Rec #:  [id number]  Height:       60.0 in Accession #:    [id number] Weight:       107.8 lb Date of Birth:  06/18/1946 BSA:          1.435 m Patient Age:    76 years   BP:           92/60 mmHg Patient Gender: M          HR:           87 bpm. Exam Location:  [facility] Procedure: Limited Echo, Color Doppler and Cardiac Doppler Indications:     Tamponade I31.4  History:         Patient has prior history of Echocardiogram examinations, most                  recent 03/06/2023. CHF; Risk Factors:Hypertension. Tobacco abuse.  Sonographer:     Cristela Blue Referring Phys:  [id number] CARALYN HUDSON Diagnosing Phys: Marcina Millard MD IMPRESSIONS  1. Left ventricular ejection fraction, by estimation, is 60 to 65%. The left ventricle has normal function. The left ventricle has no regional wall motion abnormalities. Indeterminate diastolic filling due to E-A fusion.  2. Right ventricular systolic function is normal. The right ventricular size is normal.  3. Moderate pericardial effusion.  4. The mitral valve is normal in structure. Mild mitral valve regurgitation. No evidence of mitral stenosis.  5. The aortic valve is normal in structure. Aortic valve regurgitation is mild. No aortic stenosis is present.  6. The inferior vena cava is normal in size with greater than 50% respiratory variability, suggesting right atrial pressure of 3 mmHg. FINDINGS  Left Ventricle: Left ventricular ejection fraction, by estimation, is 60 to 65%. The left ventricle has normal function. The left ventricle has no regional wall motion abnormalities. The left ventricular internal cavity size was normal in size. There is  no left ventricular hypertrophy. Indeterminate diastolic filling due to  E-A fusion. Right Ventricle: The right ventricular size is normal. No increase in right ventricular wall thickness. Right ventricular systolic function is normal. Left Atrium: Left atrial size was normal in size. Right Atrium: Right atrial size was normal in size. Pericardium: Pericardial effusion much reduced after pericardiocenetesis. No right atrial or right ventricular diastolic collapse. No evidence for preicardial tamonade. A moderately sized pericardial effusion is present. Mitral Valve: The mitral valve is normal in structure. Mild mitral valve regurgitation. No evidence of mitral valve stenosis.  Tricuspid Valve: The tricuspid valve is normal in structure. Tricuspid valve regurgitation is mild . No evidence of tricuspid stenosis. Aortic Valve: The aortic valve is normal in structure. Aortic valve regurgitation is mild. Aortic regurgitation PHT measures 436 msec. No aortic stenosis is present. Pulmonic Valve: The pulmonic valve was normal in structure. Pulmonic valve regurgitation is not visualized. No evidence of pulmonic stenosis. Aorta: The aortic root is normal in size and structure. Venous: The inferior vena cava is normal in size with greater than 50% respiratory variability, suggesting right atrial pressure of 3 mmHg. IAS/Shunts: No atrial level shunt detected by color flow Doppler. Additional Comments: Spectral Doppler performed. Color Doppler performed.  LEFT VENTRICLE PLAX 2D LVIDd:         4.40 cm LVIDs:         2.80 cm LV PW:         0.90 cm LV IVS:        0.80 cm LVOT diam:     2.00 cm LVOT Area:     3.14 cm  LEFT ATRIUM         Index LA diam:    2.80 cm 1.95 cm/m  AORTIC VALVE AI PHT:      436 msec  AORTA Ao Root diam: 3.00 cm MITRAL VALVE                TRICUSPID VALVE MV Area (PHT): 4.99 cm     TR Peak grad:   24.0 mmHg MV Decel Time: 152 msec     TR Vmax:        245.00 cm/s MV E velocity: 126.00 cm/s                             SHUNTS                             Systemic Diam: 2.00 cm  Marcina Millard MD Electronically signed by Marcina Millard MD Signature Date/Time: 03/07/2023/1:09:22 PM    Final    CT HEAD WO CONTRAST ( )  Result Date: 03/07/2023 CLINICAL DATA:  Neuro deficit, acute, stroke suspected. Altered mental status. Memory loss. EXAM: CT HEAD WITHOUT CONTRAST TECHNIQUE: Contiguous axial images were obtained from the base of the skull through the vertex without intravenous contrast. RADIATION DOSE REDUCTION: This exam was performed according to the departmental dose-optimization program which includes automated exposure control, adjustment of the mA and/or kV according to patient size and/or use of iterative reconstruction technique. COMPARISON:  Head CT and MRI 03/05/2023 FINDINGS: Brain: There is no evidence of an acute cortically based infarct, intracranial hemorrhage, midline shift, or extra-axial fluid collection. The ventricles are normal in size. Mild-to-moderate vasogenic edema in the right frontal lobe, left occipital lobe, and right cerebellar hemisphere is unchanged and without significant associated mass effect. Mild to moderate hypodensities elsewhere in the cerebral white matter bilaterally are nonspecific but compatible with chronic small vessel ischemic disease. 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 Vascular: Calcified atherosclerosis at the skull base. No hyperdense vessel. Skull: No acute fracture or suspicious osseous lesion. Sinuses/Orbits: Minimal mucosal thickening in the paranasal sinuses. Clear mastoid air cells. Unremarkable orbits. Other: None. IMPRESSION: 1. Unchanged edema associated with known lesions in the right frontal lobe, left occipital lobe, and right cerebellum. No significant mass effect. 2. No evidence of a new intracranial abnormality. Electronically Signed   By: Freida Busman  Mosetta Putt M.D.   On: 03/07/2023 11:23   DG Chest Port 1 View  Result Date: 03/07/2023 CLINICAL DATA:  Pleural effusion. EXAM: PORTABLE CHEST 1 VIEW  COMPARISON:  Chest radiograph and CT 03/05/2023 FINDINGS: The cardiac silhouette remains enlarged. Aortic atherosclerosis is noted. Veiling opacities in the lower lungs are consistent with persistent small to moderate pleural effusions as shown on CT. Bilateral interstitial and patchy airspace opacities have slightly worsened. No pneumothorax is identified. IMPRESSION: 1. Slight worsening of bilateral lung opacities which may reflect edema or pneumonia. 2. Persistent bilateral pleural effusions. Electronically Signed   By: Sebastian Ache M.D.   On: 03/07/2023 09:50   US Venous Img Lower Bilateral (DVT)  Result Date: 03/06/2023 CLINICAL DATA:  Lower extremity edema EXAM: BILATERAL LOWER EXTREMITY VENOUS DOPPLER ULTRASOUND TECHNIQUE: Gray-scale sonography with graded compression, as well as color Doppler and duplex ultrasound were performed to evaluate the lower extremity deep venous systems from the level of the common femoral vein and including the common femoral, femoral, profunda femoral, popliteal and calf veins including the posterior tibial, peroneal and gastrocnemius veins when visible. The superficial great saphenous vein was also interrogated. Spectral Doppler was utilized to evaluate flow at rest and with distal augmentation maneuvers in the common femoral, femoral and popliteal veins. COMPARISON:  None Available. FINDINGS: RIGHT LOWER EXTREMITY Common Femoral Vein: No evidence of thrombus. Normal compressibility, respiratory phasicity and response to augmentation. Saphenofemoral Junction: No evidence of thrombus. Normal compressibility and flow on color Doppler imaging. Profunda Femoral Vein: No evidence of thrombus. Normal compressibility and flow on color Doppler imaging. Femoral Vein: No evidence of thrombus. Normal compressibility, respiratory phasicity and response to augmentation. Popliteal Vein: No evidence of thrombus. Normal compressibility, respiratory phasicity and response to augmentation.  Calf Veins: No evidence of thrombus. Normal compressibility and flow on color Doppler imaging. Superficial Great Saphenous Vein: No evidence of thrombus. Normal compressibility. Venous Reflux:  None. Other Findings:  None. LEFT LOWER EXTREMITY Common Femoral Vein: No evidence of thrombus. Normal compressibility, respiratory phasicity and response to augmentation. Saphenofemoral Junction: No evidence of thrombus. Normal compressibility and flow on color Doppler imaging. Profunda Femoral Vein: No evidence of thrombus. Normal compressibility and flow on color Doppler imaging. Femoral Vein: No evidence of thrombus. Normal compressibility, respiratory phasicity and response to augmentation. Popliteal Vein: No evidence of thrombus. Normal compressibility, respiratory phasicity and response to augmentation. Calf Veins: No evidence of thrombus. Normal compressibility and flow on color Doppler imaging. Superficial Great Saphenous Vein: No evidence of thrombus. Normal compressibility. Venous Reflux:  None. Other Findings:  None. IMPRESSION: No evidence of deep venous thrombosis in either lower extremity. Electronically Signed   By: Alcide Clever M.D.   On: 03/06/2023 19:43   ECHOCARDIOGRAM COMPLETE  Result Date: 03/06/2023    ECHOCARDIOGRAM REPORT   Patient Name:  Pape Bunten    Date of      03/06/2023                           Exam: Medical Rec #: [id number]  Height:      60.0 in Accession #:   [id number] Weight:      99.2 lb Date of Birth: [birth year] BSA:         1.386 m Patient Age:   76 years   BP:          Not listed in chart/Not listed in chart  mmHg Patient        M          HR:          Not listed in chart bpm. Gender: Exam Location: [facility] Procedure: 2D Echo, Cardiac Doppler and Color Doppler Indications:     Tamponade I31.4  History:         Patient has prior history of Echocardiogram examinations, most                  recent 03/05/2023. CHF; Risk Factors:Hypertension. Tobacco abuse.   Sonographer:     Cristela Blue Referring Phys:  [id number] [facility] MICHELLE TANG Diagnosing Phys: Marcina Millard MD IMPRESSIONS  1. Left ventricular ejection fraction, by estimation, is 60 to 65%. The left ventricle has normal function. The left ventricle has no regional wall motion abnormalities. Left ventricular diastolic parameters are indeterminate.  2. Right ventricular systolic function is normal. The right ventricular size is normal.  3. Successful pericardiocentesis with removal of 1100 cc serosanguinous pericardial fluid.. Findings are consistent with cardiac tamponade.  4. The mitral valve is normal in structure. No evidence of mitral valve regurgitation. No evidence of mitral stenosis.  5. The aortic valve is normal in structure. Aortic valve regurgitation is not visualized. No aortic stenosis is present.  6. The inferior vena cava is normal in size with greater than 50% respiratory variability, suggesting right atrial pressure of 3 mmHg. FINDINGS  Left Ventricle: Left ventricular ejection fraction, by estimation, is 60 to 65%. The left ventricle has normal function. The left ventricle has no regional wall motion abnormalities. The left ventricular internal cavity size was normal in size. There is  no left ventricular hypertrophy. Left ventricular diastolic parameters are indeterminate. Right Ventricle: The right ventricular size is normal. No increase in right ventricular wall thickness. Right ventricular systolic function is normal. Left Atrium: Left atrial size was normal in size. Right Atrium: Right atrial size was normal in size. Pericardium: Successful pericardiocentesis with removal of 1100 cc serosanguinous pericardial fluid. There is no evidence of pericardial effusion. The pericardial effusion is circumferential. There is diastolic collapse of the right ventricular free wall. There is evidence of cardiac tamponade. Mitral Valve: The mitral valve is normal in structure. No evidence of mitral valve  regurgitation. No evidence of mitral valve stenosis. Tricuspid Valve: The tricuspid valve is normal in structure. Tricuspid valve regurgitation is not demonstrated. No evidence of tricuspid stenosis. Aortic Valve: The aortic valve is normal in structure. Aortic valve regurgitation is not visualized. No aortic stenosis is present. Pulmonic Valve: The pulmonic valve was normal in structure. Pulmonic valve regurgitation is not visualized. No evidence of pulmonic stenosis. Aorta: The aortic root is normal in size and structure. Venous: The inferior vena cava is normal in size with greater than 50% respiratory variability, suggesting right atrial pressure of 3 mmHg. IAS/Shunts: No atrial level shunt detected by color flow Doppler.  LEFT VENTRICLE PLAX 2D LVIDd:         3.85 cm LVIDs:         3.10 cm LV PW:         0.75 cm LV IVS:        1.10 cm  RIGHT VENTRICLE RV Basal diam:  3.40 cm RV Mid diam:    2.50 cm LEFT ATRIUM         Index       RIGHT ATRIUM           Index LA diam:  4.40 cm 3.18 cm/m  RA Area:     12.50 cm                                 RA Volume:   27.90 ml  20.14 ml/m Marcina Millard MD Electronically signed by Marcina Millard MD Signature Date/Time: 03/06/2023/5:12:27 PM    Final    CARDIAC CATHETERIZATION  Result Date: 03/06/2023 Successful pericardiocentesis with placement of pigtail catheter   ECHOCARDIOGRAM COMPLETE  Result Date: 03/06/2023    ECHOCARDIOGRAM REPORT   Patient Name:   Cayden Horrigan    Date of Exam: 03/05/2023 Medical Rec #:  [id number]  Height:       57.0 in Accession #:    [id number] Weight:       100.0 lb Date of Birth:  [birth year] BSA:          1.340 m Patient Age:    76 years   BP:           123/76 mmHg Patient Gender: M          HR:           74 bpm. Exam Location:  [facility] Procedure: 2D Echo, Cardiac Doppler and Color Doppler Indications:     I31.3 Pericardial Effusion  History:         Patient has prior history of Echocardiogram examinations, most                  recent  07/27/2018. CHF; Risk Factors:Hypertension, Current                  Smoker and Alcohol Abuse.  Sonographer:     Daphine Deutscher RDCS Referring Phys:  [id number] Verdene Lennert Diagnosing Phys: Yvonne Kendall MD IMPRESSIONS  1. Left ventricular ejection fraction, by estimation, is 55 to 60%. The left ventricle has normal function. The left ventricle has no regional wall motion abnormalities. Left ventricular diastolic parameters are consistent with Grade I diastolic dysfunction (impaired relaxation).  2. Right ventricular systolic function is mildly reduced. The right ventricular size is normal. There is normal pulmonary artery systolic pressure.  3. Large pericardial effusion. The pericardial effusion is circumferential. Findings are consistent with cardiac tamponade.  4. The mitral valve is normal in structure. Mild mitral valve regurgitation.  5. Tricuspid valve regurgitation is mild to moderate.  6. The aortic valve is tricuspid. There is mild calcification of the aortic valve. There is mild thickening of the aortic valve. Aortic valve regurgitation is mild to moderate.  7. The inferior vena cava is dilated in size with <50% respiratory variability, suggesting right atrial pressure of 15 mmHg. Conclusion(s)/Recommendation(s): Findings were conveyed to Dr. Gillis Santa at 8:02 AM on 03/06/2023 by Dr. Cristal De[place] End. FINDINGS  Left Ventricle: Left ventricular ejection fraction, by estimation, is 55 to 60%. The left ventricle has normal function. The left ventricle has no regional wall motion abnormalities. The left ventricular internal cavity size was normal in size. There is  no left ventricular hypertrophy. Left ventricular diastolic parameters are consistent with Grade I diastolic dysfunction (impaired relaxation). Right Ventricle: The right ventricular size is normal. No increase in right ventricular wall thickness. Right ventricular systolic function is mildly reduced. There is normal pulmonary artery  systolic pressure. The tricuspid regurgitant velocity is 1.71 m/s, and with an assumed right atrial pressure of 15 mmHg, the estimated right ventricular systolic pressure is 26.7 mmHg. Left Atrium: Left atrial  size was normal in size. Right Atrium: Right atrial size was normal in size. Pericardium: A large pericardial effusion is present. The pericardial effusion is circumferential. The pericardial effusion appears to contain fibrous material. There is diastolic collapse of the right ventricular free wall and excessive respiratory variation in the mitral valve spectral Doppler velocities. There is evidence of cardiac tamponade. Mitral Valve: The mitral valve is normal in structure. There is mild thickening of the mitral valve leaflet(s). Mild mitral valve regurgitation. Tricuspid Valve: The tricuspid valve is normal in structure. Tricuspid valve regurgitation is mild to moderate. Aortic Valve: The aortic valve is tricuspid. There is mild calcification of the aortic valve. There is mild thickening of the aortic valve. Aortic valve regurgitation is mild to moderate. Aortic regurgitation PHT measures 507 msec. Aortic valve mean gradient measures 2.5 mmHg. Aortic valve peak gradient measures 4.3 mmHg. Aortic valve area, by VTI measures 2.84 cm. Pulmonic Valve: The pulmonic valve was normal in structure. Pulmonic valve regurgitation is trivial. No evidence of pulmonic stenosis. Aorta: The aortic root and ascending aorta are structurally normal, with no evidence of dilitation. Pulmonary Artery: The pulmonary artery is of normal size. Venous: The inferior vena cava is dilated in size with less than 50% respiratory variability, suggesting right atrial pressure of 15 mmHg. IAS/Shunts: The interatrial septum was not well visualized.  LEFT VENTRICLE PLAX 2D LVIDd:         3.50 cm   Diastology LVIDs:         2.50 cm   LV e' medial:    4.46 cm/s LV PW:         0.90 cm   LV E/e' medial:  9.7 LV IVS:        0.80 cm   LV e'  lateral:   6.31 cm/s LVOT diam:     2.00 cm   LV E/e' lateral: 6.9 LV SV:         46 LV SV Index:   34 LVOT Area:     3.14 cm  RIGHT VENTRICLE RV Basal diam:  3.10 cm RV S prime:     7.29 cm/s TAPSE (M-mode): 1.3 cm LEFT ATRIUM             Index        RIGHT ATRIUM           Index LA diam:        3.10 cm 2.31 cm/m   RA Area:     10.70 cm LA Vol (A2C):   37.7 ml 28.14 ml/m  RA Volume:   27.40 ml  20.45 ml/m LA Vol (A4C):   25.3 ml 18.88 ml/m LA Biplane Vol: 33.9 ml 25.30 ml/m  AORTIC VALVE AV Area (Vmax):    2.86 cm AV Area (Vmean):   2.60 cm AV Area (VTI):     2.84 cm AV Vmax:           103.69 cm/s AV Vmean:          74.930 cm/s AV VTI:            0.161 m AV Peak Grad:      4.3 mmHg AV Mean Grad:      2.5 mmHg LVOT Vmax:         94.33 cm/s LVOT Vmean:        62.000 cm/s LVOT VTI:          0.145 m LVOT/AV VTI ratio: 0.90 AI PHT:  507 msec  AORTA Ao Root diam: 3.20 cm Ao Asc diam:  3.50 cm MITRAL VALVE               TRICUSPID VALVE MV Area (PHT): 4.28 cm    TR Peak grad:   11.7 mmHg MV Decel Time: 177 msec    TR Vmax:        171.00 cm/s MV E velocity: 43.23 cm/s MV A velocity: 63.73 cm/s  SHUNTS MV E/A ratio:  0.68        Systemic VTI:  0.15 m                            Systemic Diam: 2.00 cm Yvonne Kendall MD Electronically signed by Yvonne Kendall MD Signature Date/Time: 03/06/2023/8:13:55 AM    Final    MR Brain W and Wo Contrast  Result Date: 03/05/2023 CLINICAL DATA:  Mental status change, unknown cause EXAM: MRI HEAD WITHOUT AND WITH CONTRAST TECHNIQUE: Multiplanar, multiecho pulse sequences of the brain and surrounding structures were obtained without and with intravenous contrast. CONTRAST:  5mL GADAVIST GADOBUTROL 1 MMOL/ML IV SOLN COMPARISON:  Same day CT head FINDINGS: Brain: Negative for an acute infarct. No hydrocephalus. No extra-axial fluid collection. Small microhemorrhage in the right lentiform nucleus. Chronic left cerebellar infarct. There is sequela mild-to-moderate chronic  microvascular ischemic change. There are multiple lesions, including- -1.0 x 0.7 cm contrast-enhancing lesion in the right frontal lobe with surrounding vasogenic edema (series 18, image 108) - 0.8 x 0.7 cm contrast-enhancing lesions in in the left occipital lobe with surrounding vasogenic edema (series 18, image 69). -1.2 x 1.1 cm contrast-enhancing lesion in the superior right cerebellum mild surrounding vasogenic edema (series 18, image 50) -0.6 x 0.4 cm dural-based contrast-enhancing lesion along the left frontal convexity (series 93, image 176). Vascular: Normal flow voids. Skull and upper cervical spine: Normal marrow signal. Sinuses/Orbits: No middle ear or mastoid effusion. Mild mucosal thickening bilateral ethmoid air cells. Orbits are unremarkable. Other: None. IMPRESSION: 1. Multiple contrast-enhancing lesions in the right frontal lobe, left occipital lobe, and superior right cerebellum with surrounding vasogenic edema. 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 2. Small dural-based contrast-enhancing lesion along the left frontal convexity could represent a small meningimoa, but metastatic disease is not excluded. Electronically Signed   By: Lorenza Cambridge M.D.   On: 03/05/2023 16:48   CT CHEST ABDOMEN PELVIS W CONTRAST  Result Date: 03/05/2023 CLINICAL DATA:  Brain mass. Assess for occult malignancy. * Tracking Code: BO * EXAM: CT CHEST, ABDOMEN, AND PELVIS WITH CONTRAST TECHNIQUE: Multidetector CT imaging of the chest, abdomen and pelvis was performed following the standard protocol during bolus administration of intravenous contrast. RADIATION DOSE REDUCTION: This exam was performed according to the departmental dose-optimization program which includes automated exposure control, adjustment of the mA and/or kV according to patient size and/or use of iterative reconstruction technique. CONTRAST:  75mL OMNIPAQUE IOHEXOL 300 MG/ML  SOLN COMPARISON:  Chest CT without contrast  04/28/2015. X-ray chest earlier 03/05/2023 FINDINGS: CT CHEST FINDINGS Cardiovascular: Very large pericardial effusion. The heart itself is nonenlarged. Significant coronary artery calcifications. The thoracic aorta has a normal course and caliber with mild atherosclerotic calcified plaque. Mediastinum/Nodes: Normal caliber thoracic esophagus. Preserved thyroid gland. No specific abnormal lymph node enlargement seen including in the axillary regions, hilum. Prominent right paratracheal node on series 2, image 18 measures 18 by 7 mm. This could be 2 adjacent nodes. Subcarinal node series 2,  image 29 measures 2.7 by 1.1 cm, mildly enlarged. Lungs/Pleura: Moderate right and small left effusion. There are some loculated components on the left with fluid tracking along the interlobar fissure. Adjacent parenchymal opacities. Atelectasis versus infiltrate. There is a opacity specifically in the lingula as well. Scattered ground-glass elsewhere in the visualized lungs. No pneumothorax. No obvious mass. Significant breathing motion throughout the examination. A subtle mass lesion could be obscured by the lung opacity in the effusions. Follow up study could be performed after improvement of the effusions. Musculoskeletal: Mild degenerative changes along the spine. CT ABDOMEN PELVIS FINDINGS Hepatobiliary: Contrast exam in the abdomen is more in the late arterial phase and portal venous phase. Patent portal vein. Gallbladder is present. Small cysts seen in segment 4 of the liver. Pancreas: Unremarkable. No pancreatic ductal dilatation or surrounding inflammatory changes. Spleen: Normal in size without focal abnormality. Adrenals/Urinary Tract: Slight nonspecific thickening of the adrenal glands but unchanged from previous. No enhancing renal mass or collecting system dilatation. There are some small low-attenuation lesions in the left kidney which are too small to completely characterize but likely benign cysts, Bosniak 2  lesions. No specific imaging follow-up. The ureters have normal course and caliber extending down to the bladder. Diffuse wall thickening of the urinary bladder. Stomach/Bowel: On this non oral contrast exam, large bowel has a normal caliber. Redundant course of the sigmoid colon. There are some areas of mild wall thickening along loops of bowel, nonspecific. The stomach is mildly distended with fluid. Mild fold thickening suggested small bowel is nondilated. Vascular/Lymphatic: Normal caliber aorta and IVC with moderate calcified plaque along the aorta and iliac vessels. There are several enlarged nodes identified in the retroperitoneum and upper abdomen. Example aortocaval node posteriorly on series 2, image 64 measures 17 by 13 mm. No next of the celiac series 2, image 59 measures 16 by 14 mm. Left para-node anteriorly on series 2, image 68 measures 17 by 8 mm. A few small retrocrural nodes are also identified. Reproductive: Enlarged heterogeneous prostate. Please correlate with patient's PSA. Other: Anasarca.  Mesenteric haziness.  Trace ascites.  No free air Musculoskeletal: Mild degenerative changes along the spine. IMPRESSION: Large pericardial effusion. Please correlate with and new symptoms including for tamponade. Moderate right and small left effusion with some loculated components on the left. Adjacent parenchymal opacities. Atelectasis versus infiltrate. Recommend follow up after clearance of the effusions. Few borderline enlarged lymph nodes identified including mediastinum, upper retroperitoneum. Enlarged prostate with mass effect along the bladder with bladder wall thickening. Please correlate with patient's PSA. No bowel obstruction or free air. There is scattered mesenteric haziness with trace ascites. There are some small areas of wall thickening along the colon but this could simply relate to the other signs of edema. Electronically Signed   By: Karen Kays M.D.   On: 03/05/2023 15:03   DG  Chest Port 1 View  Result Date: 03/05/2023 CLINICAL DATA:  Shortness of breath EXAM: PORTABLE CHEST 1 VIEW COMPARISON:  07/27/2018 FINDINGS: Cardiac silhouette is enlarged with diffuse interstitial opacities throughout both lungs compatible with edema related to CHF. Basilar atelectasis noted, worse on the left. Difficult to exclude small effusions. Also left lower lobe retrocardiac opacity obscures the left hemidiaphragm concerning for possible superimposed pneumonia. No pneumothorax. Trachea midline. Aorta atherosclerotic. No acute osseous finding. IMPRESSION: 1. Cardiomegaly with diffuse interstitial edema pattern. 2. Left lower lobe airspace consolidation concerning for superimposed pneumonia. 3. Suspect small effusions. Electronically Signed   By: Judie Petit.  Shick M.D.  On: 03/05/2023 13:20   CT Head Wo Contrast  Result Date: 03/05/2023 CLINICAL DATA:  Mental status change of unknown cause. Memory disturbance. EXAM: CT HEAD WITHOUT CONTRAST TECHNIQUE: Contiguous axial images were obtained from the base of the skull through the vertex without intravenous contrast. RADIATION DOSE REDUCTION: This exam was performed according to the departmental dose-optimization program which includes automated exposure control, adjustment of the mA and/or kV according to patient size and/or use of iterative reconstruction technique. COMPARISON:  07/27/2018 FINDINGS: Brain: No focal abnormality affects the brainstem. Question mild edema and mass effect in the inferior right cerebellum. 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 There is abnormal vasogenic edema within the right posterior frontal region worrisome for an underlying mass lesion. In the left hemisphere, there is abnormal vasogenic edema in the occipital lobe worrisome for the presence of an occult mass lesion. No visible hemorrhage. Hydrocephalus or extra-axial collection. Vascular: There is atherosclerotic calcification of the major vessels at the base of the  brain. Skull: No calvarial abnormality. Sinuses/Orbits: Clear/normal Other: None IMPRESSION: 1. Abnormal vasogenic edema in the right posterior frontal region and in the left occipital lobe worrisome for the presence of occult mass lesions. Question mild edema and mass effect in the inferior right cerebellum. No visible hemorrhage. MRI with and without contrast is recommended for further evaluation. 2. 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 3. Atherosclerotic calcification of the major vessels at the base of the brain. Electronically Signed   By: Paulina Fusi M.D.   On: 03/05/2023 12:52    Microbiology: Recent Results (from the past 240 hour(s))  Blood culture (routine x 2)     Status: None   Collection Time: 03/05/23  2:10 PM   Specimen: BLOOD  Result Value Ref Range Status   Specimen Description BLOOD BLOOD RIGHT ARM  Final   Special Requests   Final    BOTTLES DRAWN AEROBIC AND ANAEROBIC Blood Culture adequate volume   Culture   Final    NO GROWTH 5 DAYS Performed at [facility], [address] [place], [place] [address]    Report Status 03/10/2023 FINAL  Final  Blood culture (routine x 2)     Status: None   Collection Time: 03/05/23  2:10 PM   Specimen: BLOOD  Result Value Ref Range Status   Specimen Description BLOOD BLOOD LEFT ARM  Final   Special Requests   Final    BOTTLES DRAWN AEROBIC AND ANAEROBIC Blood Culture adequate volume   Culture   Final    NO GROWTH 5 DAYS Performed at [facility], [address], [place], [place] [address]    Report Status 03/10/2023 FINAL  Final  Body fluid culture w Gram Stain     Status: None   Collection Time: 03/06/23  9:34 AM   Specimen: PATH Cytology Misc. fluid; Body Fluid  Result Value Ref Range Status   Specimen Description   Final    PERICARDIAL Performed at [facility], [address], [place], [place] [address]    Special Requests   Final    PERICARDIAL Performed at [facility], [address] [place], [place] [address]    Gram Stain NO WBC SEEN NO ORGANISMS SEEN   Final   Culture   Final    NO GROWTH 3 DAYS Performed at [facility], [address], [place], [place] [address]    Report Status 03/10/2023 FINAL  Final  Pleural fluid culture  w Gram Stain     Status: None   Collection Time: 03/07/23  4:00 PM   Specimen: Pleural Fluid  Result Value Ref Range Status   Specimen Description   Final    PLEURAL Performed at [facility], [address], [place], [place] [address]    Special Requests   Final    NONE Performed at [facility], [address] [place], [place] [address]    Gram Stain   Final    FEW WBC PRESENT,BOTH PMN AND MONONUCLEAR NO ORGANISMS SEEN    Culture   Final    NO GROWTH 3 DAYS Performed at [facility], [address] [place], [place] [address]    Report Status 03/11/2023 FINAL  Final  Acid Fast Smear (AFB)     Status: None   Collection Time: 03/07/23  4:06 PM   Specimen: Pleural, Right; Pleural Fluid  Result Value Ref Range Status   AFB Specimen Processing Concentration  Final   Acid Fast Smear Negative  Final    Comment: (NOTE) Performed At: [facility] [address] [place], [place] [id number] Jolene Schimke MD OZ:[id number]    Source (AFB) PLEURAL  Final    Comment: Performed at [facility], [address] [place], [place] [id number]  Culture, fungus without smear     Status: None (Preliminary result)   Collection Time: 03/07/23  4:06 PM   Specimen: Pleural Fluid  Result Value Ref Range Status   Specimen Description   Final    PLEURAL Performed at [facility], [address], [place], [place] [address]    Special Requests   Final    NONE Performed at [facility], [address], [place], [place] [address]    Culture   Final    NO FUNGUS ISOLATED AFTER 5 DAYS Performed at [facility], [address] [place], [place]  [address]    Report Status PENDING  Incomplete     Labs: CBC: Recent Labs  Lab 03/06/23 0258 03/07/23 0308 03/08/23 0539 03/09/23 0413 03/10/23 0540 03/11/23 0542 03/12/23 0426  WBC 11.3*   < > 12.1* 11.5* 11.4* 12.2* 12.6*  NEUTROABS 9.2*  --   --   --   --   --   --   HGB 10.9*   < > 9.7* 10.4* 10.2* 11.1* 11.1*  HCT 32.2*   < > 30.4* 32.4* 32.1* 34.1* 34.5*  MCV 91.5   < > 97.1 95.9 95.8 94.7 94.5  PLT 309   < > 260 281 270 263 267   < > = values in this interval not displayed.   Basic Metabolic Panel: Recent Labs  Lab 03/08/23 0539 03/09/23 0413 03/10/23 0540 03/11/23 0542 03/12/23 0426  NA 134* 133* 130* 133* 131*  K 3.7 3.9 3.8 4.2 4.2  CL 107 107 103 104 100  CO2 23 22 23 23 25   GLUCOSE 73 129* 122* 126* 94  BUN 28* 20 16 17 19   CREATININE 0.82 0.77 0.72 0.76 0.68  CALCIUM 7.7* 8.1* 7.8* 8.3* 8.2*  MG 2.3 2.2 2.2 2.0 1.9  PHOS 3.1 3.5 3.2 3.8 4.4   Liver Function Tests: Recent Labs  Lab 03/08/23 0539 03/09/23 0413 03/10/23 0540 03/11/23 0542 03/12/23 0426  AST 90* 75* 76* 59* 75*  ALT 122* 117* 100* 88* 106*  ALKPHOS 52 63 59 64 66  BILITOT 0.7 0.7 0.6 0.7 0.5  PROT 5.2* 5.6* 5.4* 5.3* 5.4*  ALBUMIN 2.2* 2.2* 2.1* 2.0* 2.2*   No results for input(s): "LIPASE", "AMYLASE" in the last 168 hours. No results for input(s): "AMMONIA" in the last 168 hours. Cardiac Enzymes: No results for input(s): "CKTOTAL", "CKMB", "CKMBINDEX", "TROPONINI" in the last 168 hours. BNP (last 3 results) Recent Labs    03/05/23 1112  BNP 99.4   CBG: Recent Labs  Lab 03/05/23 2257  GLUCAP 119*    Time spent: 35 minutes  Signed:  Gillis Santa  [facility] 03/12/2023 12:19 PM

## 2023-03-12 NOTE — Progress Notes (Addendum)
Daily Progress Note   Patient Name: Cody [name]       Date: 03/12/2023 DOB: [birth year]  Age: 77 y.o. MRN#: [id number] Attending Physician: Cody Santa, MD Primary Care Physician: Cody Eva, NP Admit Date: 03/05/2023  Reason for Consultation/Follow-up: Establishing goals of care  Subjective: In to see patient.  Patient's son-in-law Cody [name] is at bedside.  He discusses that the family has been talking and they are prepared for patient to come home.  He advises that they are looking to complete radiation therapy and talk with oncology further about interventions.  He states that they have discussed given his diagnoses and his age, treating the treatable and doing what they can for him to make him comfortable, but not returning to the hospital, and allowing him to stay at home until death.  He advises that the family has discussed not doing resuscitation if his heart and breathing stop.  Spoke with patient directly using [place] Designer, television/film set # [id number].   Patient's daughter Cody [name] came into the bedside as well.  During part of the interpretation, interpreter stated she did not understand what he was saying.  He answered yes and no to questions asked.  Distractions noted during interpretation of phone ringing and people talking.   During interpretation, patient voices that he is aware that he has brain lesions and fluid around his lungs.  Patient voices awareness of a plan to discharge home and follow-up with oncology outpatient; he did add that he plans to meet with them next week.  He confirms a wish to not return to the hospital, and to be kept at home until death.  He indicates that he would not want CPR or resuscitative efforts.  Patient, Cody [name] spoke amongst themselves, and interpreter  told me their conversation to patient regarding plans not to Kennith CPR and other life-prolonging interventions.  Patient states he would want his wife to be his surrogate decision maker if he is unable to make decisions for himself.  Length of Stay: 7  Current Medications: Scheduled Meds:   amiodarone  200 mg Oral Daily   cefUROXime  500 mg Oral BID WC   colchicine  0.6 mg Oral Daily   dexamethasone  2 mg Oral Q12H   doxycycline  100 mg Oral Q12H   feeding supplement  237 mL Oral  TID BM   folic acid  1 mg Oral Daily   iron polysaccharides  150 mg Oral Daily   levETIRAcetam  500 mg Oral BID   multivitamin with minerals  1 tablet Oral Daily   polyethylene glycol  17 g Oral BID   sodium chloride flush  3 mL Intravenous Q12H   sodium chloride flush  3 mL Intravenous Q12H   tamsulosin  0.4 mg Oral Daily   thiamine  100 mg Oral Daily   Or   thiamine  100 mg Intravenous Daily   Vitamin D (Ergocalciferol)  50,000 Units Oral Q7 days    Continuous Infusions:  sodium chloride 10 mL (03/08/23 2257)   sodium chloride 1 mL/kg/hr (03/08/23 1900)    PRN Meds: sodium chloride, acetaminophen **OR** acetaminophen, alum & mag hydroxide-simeth, bisacodyl, bisacodyl, ipratropium-albuterol, ondansetron **OR** ondansetron (ZOFRAN) IV, sodium chloride flush  Physical Exam Pulmonary:     Effort: Pulmonary effort is normal.  Neurological:     Mental Status: He is alert.             Vital Signs: BP 138/67 (BP Location: Right Arm)   Pulse 75   Temp (!) 97.5 F (36.4 C)   Resp 18   Ht 5' (1.524 m)   Wt 50.1 kg   SpO2 99%   BMI 21.57 kg/m  SpO2: SpO2: 99 % O2 Device: O2 Device: Nasal Cannula O2 Flow Rate: O2 Flow Rate (L/min): 2 L/min  Intake/output summary:  Intake/Output Summary (Last 24 hours) at 03/12/2023 1231 Last data filed at 03/12/2023 1610 Gross per 24 hour  Intake --  Output 900 ml  Net -900 ml   LBM: Last BM Date : 03/12/23 Baseline Weight: Weight: 45.4 kg Most recent  weight: Weight: 50.1 kg   Patient Active Problem List   Diagnosis Date Noted   Metastatic non-small cell lung cancer (HCC) 03/08/2023   Goals of care, counseling/discussion 03/08/2023   Metastasis to brain (HCC) 03/06/2023   Acute encephalopathy 03/05/2023   Acute hyponatremia 03/05/2023   Pericardial effusion 03/05/2023   Vasogenic edema (HCC) 03/05/2023   Brain mass 03/05/2023   Pleural effusion, bilateral 03/05/2023   Alcohol use disorder 03/05/2023   BPH (benign prostatic hyperplasia) 03/05/2023   Elevated LFTs 03/05/2023   Pneumonia of left lower lobe due to infectious organism 03/05/2023   Congestive heart failure (HCC) 12/24/2022   Chronic obstructive pulmonary disease (HCC) 12/24/2022   Mixed hyperlipidemia 12/24/2022   Nicotine dependence with nicotine-induced disorder 12/24/2022   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  Patient currently stating that he would not want CPR or to return to the hospital.  He states he would want his wife to be his surrogate decision maker. Would recommend following back up with patient to ensure these wishes.  Updates sent to attending via epic chat.   Recommend follow up with Cody Munroe NP in cancer center. Spoke with [employer] NP.  Code Status:    Code Status Orders  (From admission, onward)           Start     Ordered   03/05/23 1544  Full code  Continuous       Question:  By:  Answer:  Consent: discussion documented in EHR   03/05/23 1545  Code Status History     Date Active Date Inactive Code Status Order ID Comments User Context   08/25/2018 1408 08/25/2018 1923 Full Code [id number]  Laurier Nancy, MD Inpatient   07/27/2018 0224 07/27/2018 2126 Full Code [id number]   Barbaraann Rondo, MD ED   07/27/2018 0224 07/27/2018 0224 Full Code [id number]  Barbaraann Rondo, MD ED       Prognosis: Poor    Care plan was discussed with attending via epic chat  Thank you for allowing the Palliative Medicine Team to assist in the care of this patient.    Cody Stall, NP  Please contact Palliative Medicine Team phone at [phone] for questions and concerns.

## 2023-03-12 NOTE — Progress Notes (Signed)
Referral received for new lung cancer diagnosis and needs assistance with scheduling outpatient follow up with Dr. Cathie Hoops this week and referral to Dr. Rushie Chestnut for brain mets. Appts scheduled and pt's daughter, Irving Burton, notified of appts by scheduling team. Will follow up with patient at next clinic visit to further assess barriers.

## 2023-03-12 NOTE — Consult Note (Signed)
[facility] Customer service manager [facility]) Accountable Care Organization (ACO) [facility] Liaison Note  03/12/2023  Cody [name] 25-May-1946 [id number]  Location: [facility] Liaison screened the patient remotely at [facility].  Insurance: MCR ACO   Cody [name] is a 77 y.o. 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 Designer, fashion/clothing). The patient was screened for  readmission hospitalization with noted high risk score for unplanned readmission risk with 1 IP in 6 months.  The patient was assessed for potential [facility]) Care Management service needs for post hospital transition for care coordination. Review of patient's electronic medical record reveals patient was admitted with Acute Encephalopathy. Palliative currently consulting on pt for goals of care based upon a recent diagnosis.   Plan: [facility] Liaison will continue to follow progress and disposition to asess for post hospital community care coordination/management needs.  Referral request for community care coordination: Anticipate a post hospital follow up call.   [facility] Care Management/Population Health does not replace or interfere with any arrangements made by the Inpatient Transition of Care team.   For questions contact:   Elliot Cousin, RN, BSN [facility] Liaison [place]   [facility]  Population Health Office Hours MTWF 8:00 am to 6 pm off on Thursday [phone] mobile [phone] [Office toll free line][facility] Office Hours are M-F 8:30 - 5 pm 24 hour nurse advise line [phone] Conceirge  [name]

## 2023-03-13 ENCOUNTER — Inpatient Hospital Stay (HOSPITAL_BASED_OUTPATIENT_CLINIC_OR_DEPARTMENT_OTHER): Payer: Medicare Other | Admitting: Oncology

## 2023-03-13 ENCOUNTER — Encounter: Payer: Self-pay | Admitting: Radiation Oncology

## 2023-03-13 ENCOUNTER — Encounter: Payer: Self-pay | Admitting: *Deleted

## 2023-03-13 ENCOUNTER — Ambulatory Visit
Admission: RE | Admit: 2023-03-13 | Discharge: 2023-03-13 | Disposition: A | Discharge status: Home / Self Care | Payer: Medicare Other | Source: Ambulatory Visit | Attending: Radiation Oncology | Admitting: Radiation Oncology

## 2023-03-13 ENCOUNTER — Encounter: Payer: Self-pay | Admitting: Oncology

## 2023-03-13 VITALS — BP 134/72 | HR 89 | Temp 97.5°F | Ht 60.0 in | Wt 110.0 lb

## 2023-03-13 VITALS — BP 134/72 | HR 89 | Temp 97.5°F | Wt 110.0 lb

## 2023-03-13 DIAGNOSIS — C3490 Malignant neoplasm of unspecified part of unspecified bronchus or lung: Secondary | ICD-10-CM

## 2023-03-13 DIAGNOSIS — C7931 Secondary malignant neoplasm of brain: Secondary | ICD-10-CM | POA: Insufficient documentation

## 2023-03-13 DIAGNOSIS — G9389 Other specified disorders of brain: Secondary | ICD-10-CM

## 2023-03-13 DIAGNOSIS — F1010 Alcohol abuse, uncomplicated: Secondary | ICD-10-CM | POA: Diagnosis not present

## 2023-03-13 DIAGNOSIS — R7989 Other specified abnormal findings of blood chemistry: Secondary | ICD-10-CM | POA: Diagnosis not present

## 2023-03-13 DIAGNOSIS — I3131 Malignant pericardial effusion in diseases classified elsewhere: Secondary | ICD-10-CM | POA: Insufficient documentation

## 2023-03-13 DIAGNOSIS — R590 Localized enlarged lymph nodes: Secondary | ICD-10-CM | POA: Diagnosis not present

## 2023-03-13 DIAGNOSIS — E871 Hypo-osmolality and hyponatremia: Secondary | ICD-10-CM | POA: Insufficient documentation

## 2023-03-13 DIAGNOSIS — I10 Essential (primary) hypertension: Secondary | ICD-10-CM | POA: Diagnosis not present

## 2023-03-13 DIAGNOSIS — I3139 Other pericardial effusion (noninflammatory): Secondary | ICD-10-CM | POA: Insufficient documentation

## 2023-03-13 DIAGNOSIS — Z87891 Personal history of nicotine dependence: Secondary | ICD-10-CM | POA: Insufficient documentation

## 2023-03-13 DIAGNOSIS — F17210 Nicotine dependence, cigarettes, uncomplicated: Secondary | ICD-10-CM | POA: Diagnosis not present

## 2023-03-13 DIAGNOSIS — Z510 Encounter for antineoplastic radiation therapy: Secondary | ICD-10-CM | POA: Insufficient documentation

## 2023-03-13 DIAGNOSIS — Z7189 Other specified counseling: Secondary | ICD-10-CM | POA: Diagnosis not present

## 2023-03-13 DIAGNOSIS — Z7952 Long term (current) use of systemic steroids: Secondary | ICD-10-CM | POA: Diagnosis not present

## 2023-03-13 DIAGNOSIS — I509 Heart failure, unspecified: Secondary | ICD-10-CM | POA: Insufficient documentation

## 2023-03-13 DIAGNOSIS — Z79899 Other long term (current) drug therapy: Secondary | ICD-10-CM | POA: Insufficient documentation

## 2023-03-13 DIAGNOSIS — F17219 Nicotine dependence, cigarettes, with unspecified nicotine-induced disorders: Secondary | ICD-10-CM

## 2023-03-13 LAB — CHOLESTEROL, BODY FLUID: Cholesterol, Fluid: 30 mg/dL

## 2023-03-13 NOTE — Assessment & Plan Note (Signed)
Patient has establish care with radiation oncology for palliative radiation. Continue dexamethasone 2 mg twice daily

## 2023-03-13 NOTE — Assessment & Plan Note (Signed)
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 Monitor sodium levels.

## 2023-03-13 NOTE — Assessment & Plan Note (Signed)
Secondary to chronic alcohol use. Encourage patient's alcohol cessation effort.

## 2023-03-13 NOTE — Progress Notes (Signed)
Met with patient and his daughter Cody [name] during initial consult with Dr. Rushie Chestnut and hospital follow up with Dr. Cathie Hoops. All questions answered during visit. Reviewed upcoming appts with pt's daughter. Contact info given and instructed to call with any questions or needs. Pt's daughter verbalized understanding.

## 2023-03-13 NOTE — Telephone Encounter (Signed)
application was approved for full coverage. out-of-pocket costs will be $0.

## 2023-03-13 NOTE — Assessment & Plan Note (Signed)
Encourage patient's smoke cessation effort 

## 2023-03-13 NOTE — Assessment & Plan Note (Addendum)
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 Prognosis is poor. Clinically patient has improved. I recommend NGS testing with PD-L1 staining. Discussed with patient and daughter regarding possible treatment options with dose reduced chemotherapy +/- immunotherapy if patient has no treatable mutations. Family prefers not to directly reveal patient diagnosis to him self.  Family prefers to take time and relay cancer diagnosis information to him.I encourage patient's family to further discuss with patient about his diagnosis. Referred to palliative care service Obtain PET scan

## 2023-03-13 NOTE — Assessment & Plan Note (Signed)
Discussed with family.  Condition is not curable.  Palliative intent

## 2023-03-13 NOTE — Assessment & Plan Note (Signed)
S/p pericardiocentesis. Cytology is pending.

## 2023-03-13 NOTE — Progress Notes (Signed)
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 Prognosis is poor. Clinically patient has improved. I recommend NGS testing with PD-L1 staining. Discussed with patient and daughter regarding possible treatment options with dose reduced chemotherapy +/- immunotherapy if patient has no treatable mutations. Family prefers not to directly reveal patient diagnosis to him self.  Family prefers to take time and relay cancer diagnosis information to him.I encourage patient's family to further discuss with patient about his diagnosis. Referred to palliative care service Obtain PET scan  Goals of care, counseling/discussion Discussed with family.  Condition is not curable.  Palliative intent  Elevated LFTs Secondary to chronic alcohol use. Encourage patient's alcohol cessation effort.  Pericardial effusion S/p pericardiocentesis. Cytology is pending.  Brain mass Patient has establish care with radiation oncology for palliative radiation. Continue dexamethasone 2 mg twice daily  Nicotine dependence with nicotine-induced disorder Encourage patient's smoke cessation effort.  Hyponatremia Likely secondary to chronic alcohol use and possible SIADH. Monitor sodium levels.   Orders Placed This Encounter  Procedures   NM PET Image Initial (PI) Skull Base To Thigh    Standing Status:   Future    Standing Expiration Date:   03/12/2024    Order Specific Question:   If indicated for the ordered procedure, I authorize the administration of a radiopharmaceutical per Radiology protocol    Answer:   Yes    Order Specific Question:   Preferred imaging location?    Answer:    [facility]   Ambulatory Referral to Palliative Care    Referral Priority:   Routine    Referral Type:   Consultation    Referral Reason:   Advance Care Planning    Number of Visits Requested:   1   Ambulatory Referral to [facility]    Referral Priority:   Routine    Referral Type:   Consultation    Referral Reason:   Specialty Services Required    Number of Visits Requested:   1   Follow-up 2 to 3 weeks. All questions were answered. The patient knows to call the clinic with any problems, questions or concerns.  Rickard Patience, MD, PhD [facility] Hematology Oncology 03/13/2023    HISTORY OF PRESENTING ILLNESS:  Mahir Altidor 77 y.o. male presents to establish care for metastatic lung cancer I have reviewed his chart and materials related to his cancer extensively and collaborated history with the patient. Summary of oncologic history is as follows: Oncology History  Metastatic non-small cell lung cancer (HCC)  03/05/2023 Imaging   CT chest abdomen pelvis with contrast showed large pericardial effusion. Moderate right and small left effusion with some loculated components on the left. Adjacent parenchymal opacities. Atelectasis versus infiltrates. Few borderline enlarged lymph nodes including mediastinum, upper retroperitoneum. Enlarged prostate with mass effect along the bladder with bladder wall thickening. No bowel obstruction or free air. Small area of wall thickening along the colon.    03/05/2023 Imaging   CT head showed abdominal vasogenic edema in the right posterior frontal region and in the left occipital lobe worrisome for presence of occult mass lesions. Questionable mild edema and mass effect in the inferior right cerebellum. No visible hemorrhage. 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 Atherosclerotic calcification of the major  vessels at the base of brain.    03/05/2023 Imaging   MRI brain with and without contrast showed multiple contrast-enhancing lesions in the  right frontal lobe, left occipital lobe, superior right cerebellum with surrounding vasogenic edema. Worrisome for intracranial metastatic disease. Small dural based contrast-enhancing lesions along the left frontal convexity could represent a small hemangioma. Metastatic disease is not excluded.    03/05/2023 - 03/12/2023 Hospital Admission   Patient presented emergency room due to confusion, shortness of breath. CT head and MRI showed multiple contrast-enhancing lesions with vasogenic edema CT chest abdomen pelvis showed large pericardial effusion, pleural effusion, mediastinal/upper retroperitoneum lymphadenopathy Patient was treated with Decadron, neurosurgery recommended no intervention. Status post pericardiocentesis on 03/07/2023.  Pigtail catheter was initially placed and later on removed.  TTE showed no pericardial effusion. Bilateral pleural effusion, status post thoracentesis.  Cytology positive. 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  Patient was treated with 3% saline clinic sodium improved. Gout with uric acid of 9.7.  Started on colchicine. Alcohol use disorder, status post CIWA protocol with Ativan as needed.   03/07/2023 Cancer Staging   Staging form: Lung, AJCC 8th Edition - Clinical stage from 03/07/2023: Stage IV (cTX, cN2, cM1) - Signed by Rickard Patience, MD on 03/13/2023 Stage prefix: Initial diagnosis   03/07/2023 Echocardiogram   1. Left ventricular ejection fraction, by estimation, is 60 to 65%. The left ventricle has normal function. The left ventricle has no regional  wall motion abnormalities. Indeterminate diastolic filling due to E-A fusion.   2. Right ventricular systolic function is normal. The right ventricular size is normal.   3. Moderate pericardial effusion.   4. The mitral valve is normal in structure. Mild mitral valve regurgitation. No evidence of mitral stenosis.   5. The aortic valve is normal in structure. Aortic valve regurgitation  is mild. No aortic stenosis is present.   6. The inferior vena cava is normal in size with greater than 50% respiratory variability, suggesting right atrial pressure of 3 mmHg.     03/07/2023 Procedure   -5/9 S/p pericardiocentesis with removal of 1.1 L of bloody fluid.  Pigtail catheter left in place.  -Cytology came back positive for malignancy.  Metastatic adenocarcinoma, compatible with pulmonary adenocarcinoma   -5/9 s/p  thoracentesis, cytology is pending.   03/08/2023 Initial Diagnosis   Metastatic non-small cell lung cancer (HCC)   03/11/2023 Echocardiogram   1. Left ventricular ejection fraction, by estimation, is 60 to 65%. The left ventricle has normal function.   2. Mild to moderate mitral valve regurgitation.   3. Aortic valve regurgitation is mild to moderate     Today patient was accompanied by her daughter Amy to the clinic.  He has establish care with Dr. Rushie Chestnut for palliative radiation. Speaks with [place]  Patient and family declined interpreter service. History was obtained from daughter Irving Burton who speaks [place]. Per Irving Burton, since discharge, patient has been doing well.  He has fair appetite.   Mental status remains at baseline.     MEDICAL HISTORY:  Past Medical History:  Diagnosis Date   Alcohol dependence (HCC)    CHF (congestive heart failure) (HCC)    Hypertension    Tobacco abuse     SURGICAL HISTORY: Past Surgical History:  Procedure Laterality Date   APPENDECTOMY     LEFT HEART CATH AND CORONARY ANGIOGRAPHY N/A 08/25/2018   Procedure: LEFT HEART CATH AND CORONARY ANGIOGRAPHY;  Surgeon: Laurier Nancy, MD;  Location: [facility] INVASIVE CV  LAB;  Service: Cardiovascular;  Laterality: N/A;   PERICARDIOCENTESIS N/A 03/06/2023   Procedure: PERICARDIOCENTESIS;  Surgeon: Marcina Millard, MD;  Location: [facility] INVASIVE CV LAB;  Service: Cardiovascular;  Laterality: N/A;    SOCIAL HISTORY: Social History   Socioeconomic History   Marital status:  Married    Spouse name: Not on file   Number of children: Not on file   Years of education: Not on file   Highest education level: Not on file  Occupational History   Not on file  Tobacco Use   Smoking status: Former    Packs/day: 0.50    Years: 50.00    Additional pack years: 0.00    Total pack years: 25.00    Types: Cigarettes    Quit date: 03/08/2023    Years since quitting: 0.0    Passive exposure: Current   Smokeless tobacco: Never  Vaping Use   Vaping Use: Never used  Substance and Sexual Activity   Alcohol use: Not Currently    Comment: stopping drinking approx 03/03/23   Drug use: Never   Sexual activity: Not Currently    Birth control/protection: None  Other Topics Concern   Not on file  Social History Narrative   Not on file   Social Determinants of Health   Financial Resource Strain: Low Risk  (08/25/2018)   Overall Financial Resource Strain (CARDIA)    Difficulty of Paying Living Expenses: Not very hard  Food Insecurity: Not on file  Transportation Needs: Unknown (08/25/2018)   PRAPARE - Transportation    Lack of Transportation (Medical): No    Lack of Transportation (Non-Medical): Not on file  Physical Activity: Not on file  Stress: No Stress Concern Present (08/25/2018)   [employer] of Occupational Health - Occupational Stress Questionnaire    Feeling of Stress : Only a little  Social Connections: Not on file  Intimate Partner Violence: Not on file    FAMILY HISTORY: History reviewed. No pertinent family history.  ALLERGIES:  is allergic to shellfish allergy.  MEDICATIONS:  Current Outpatient Medications  Medication Sig Dispense Refill   allopurinol (ZYLOPRIM) 100 MG tablet Take 1 tablet (100 mg total) by mouth daily. 30 tablet 11   amiodarone (PACERONE) 200 MG tablet Take 1 tablet (200 mg total) by mouth daily. 30 tablet 5   cefUROXime (CEFTIN) 500 MG tablet Take 1 tablet (500 mg total) by mouth 2 (two) times daily with a meal for 3 days.  6 tablet 0   colchicine 0.6 MG tablet Take 1 tablet (0.6 mg total) by mouth daily. 30 tablet 2   dexamethasone (DECADRON) 2 MG tablet Take 1 tablet (2 mg total) by mouth every 12 (twelve) hours. 60 tablet 5   doxycycline (VIBRA-TABS) 100 MG tablet Take 1 tablet (100 mg total) by mouth every 12 (twelve) hours for 3 days. 6 tablet 0   finasteride (PROSCAR) 5 MG tablet Take 1 tablet (5 mg total) by mouth daily. 90 tablet 3   folic acid (FOLVITE) 1 MG tablet Take 1 tablet (1 mg total) by mouth daily. 30 tablet 2   iron polysaccharides (NIFEREX) 150 MG capsule Take 1 capsule (150 mg total) by mouth daily. 30 capsule 2   levETIRAcetam (KEPPRA) 500 MG tablet Take 1 tablet (500 mg total) by mouth 2 (two) times daily. 60 tablet 5   Multiple Vitamin (MULTIVITAMIN WITH MINERALS) TABS tablet Take 1 tablet by mouth daily. 30 tablet 2   polyethylene glycol (MIRALAX / GLYCOLAX) 17 g packet Take  17 g by mouth daily as needed for mild constipation or moderate constipation. 14 each 0   tamsulosin (FLOMAX) 0.4 MG CAPS capsule Take 1 capsule (0.4 mg total) by mouth daily. 90 capsule 3   thiamine (VITAMIN B-1) 100 MG tablet Take 1 tablet (100 mg total) by mouth daily. 30 tablet 2   [START ON 03/17/2023] Vitamin D, Ergocalciferol, (DRISDOL) 1.25 MG (50000 UNIT) CAPS capsule Take 1 capsule (50,000 Units total) by mouth every 7 (seven) days. 12 capsule 0   No current facility-administered medications for this visit.    Review of Systems  Constitutional:  Positive for fatigue. Negative for appetite change, chills and fever.  HENT:   Negative for hearing loss and voice change.   Eyes:  Negative for eye problems and icterus.  Respiratory:  Positive for shortness of breath. Negative for chest tightness and cough.   Cardiovascular:  Negative for chest pain and leg swelling.  Gastrointestinal:  Negative for abdominal distention and abdominal pain.  Endocrine: Negative for hot flashes.  Genitourinary:  Negative for  difficulty urinating, dysuria and frequency.   Musculoskeletal:  Negative for arthralgias.  Skin:  Negative for itching and rash.  Neurological:  Negative for light-headedness and numbness.  Hematological:  Negative for adenopathy. Does not bruise/bleed easily.  Psychiatric/Behavioral:  Negative for confusion.      PHYSICAL EXAMINATION: ECOG PERFORMANCE STATUS: 2 - Symptomatic, <50% confined to bed  Vitals:   03/13/23 1033  BP: 134/72  Pulse: 89  Temp: (!) 97.5 F (36.4 C)   Filed Weights   03/13/23 1033  Weight: 110 lb (49.9 kg)    Physical Exam Constitutional:      General: He is not in acute distress.    Appearance: He is ill-appearing. He is not diaphoretic.  HENT:     Head: Normocephalic and atraumatic.     Mouth/Throat:     Pharynx: Oropharyngeal exudate present.  Eyes:     General: No scleral icterus. Cardiovascular:     Rate and Rhythm: Normal rate and regular rhythm.  Pulmonary:     Effort: Pulmonary effort is normal. No respiratory distress.  Abdominal:     General: There is no distension.  Musculoskeletal:        General: Normal range of motion.     Cervical back: Normal range of motion and neck supple.  Skin:    Findings: No erythema.  Neurological:     Mental Status: He is alert. Mental status is at baseline.     Cranial Nerves: No cranial nerve deficit.     Motor: No abnormal muscle tone.  Psychiatric:        Mood and Affect: Affect normal.      LABORATORY DATA:  I have reviewed the data as listed    Latest Ref Rng & Units 03/12/2023    4:26 AM 03/11/2023    5:42 AM 03/10/2023    5:40 AM  CBC  WBC 4.0 - 10.5 K/uL 12.6  12.2  11.4   Hemoglobin 13.0 - 17.0 g/dL 16.1  09.6  04.5   Hematocrit 39.0 - 52.0 % 34.5  34.1  32.1   Platelets 150 - 400 K/uL 267  263  270       Latest Ref Rng & Units 03/12/2023    4:26 AM 03/11/2023    5:42 AM 03/10/2023    5:40 AM  CMP  Glucose 70 - 99 mg/dL 94  409  811   BUN 8 - 23 mg/dL 19  17  16   Creatinine  0.61 - 1.24 mg/dL 4.54  0.98  1.19   Sodium 135 - 145 mmol/L 131  133  130   Potassium 3.5 - 5.1 mmol/L 4.2  4.2  3.8   Chloride 98 - 111 mmol/L 100  104  103   CO2 22 - 32 mmol/L 25  23  23    Calcium 8.9 - 10.3 mg/dL 8.2  8.3  7.8   Total Protein 6.5 - 8.1 g/dL 5.4  5.3  5.4   Total Bilirubin 0.3 - 1.2 mg/dL 0.5  0.7  0.6   Alkaline Phos 38 - 126 U/L 66  64  59   AST 15 - 41 U/L 75  59  76   ALT 0 - 44 U/L 106  88  100      RADIOGRAPHIC STUDIES: I have personally reviewed the radiological images as listed and agreed with the findings in the report. ECHOCARDIOGRAM LIMITED  Result Date: 03/11/2023    ECHOCARDIOGRAM LIMITED REPORT   Patient Name:   Taye Henery    Date of Exam: 03/11/2023 Medical Rec #:  [id number]  Height:       60.0 in Accession #:    [id number] Weight:       107.8 lb Date of Birth:  December 08, 1945 BSA:          1.435 m Patient Age:    76 years   BP:           124/56 mmHg Patient Gender: M          HR:           77 bpm. Exam Location:  [facility] Procedure: Limited Echo, Cardiac Doppler and Color Doppler Indications:     Pericardial Effusion I31.3  History:         Patient has prior history of Echocardiogram examinations, most                  recent 03/07/2023. CHF; Risk Factors:Hypertension. Alcohol                  dependence, tobacco abuse.  Sonographer:     Cristela Blue Referring Phys:  [id number] Lyn Hollingshead PARASCHOS Diagnosing Phys: Marcina Millard MD IMPRESSIONS  1. Left ventricular ejection fraction, by estimation, is 60 to 65%. The left ventricle has normal function.  2. Mild to moderate mitral valve regurgitation.  3. Aortic valve regurgitation is mild to moderate. FINDINGS  Left Ventricle: Left ventricular ejection fraction, by estimation, is 60 to 65%. The left ventricle has normal function. Pericardium: There is no evidence of pericardial effusion. Mitral Valve: Mild to moderate mitral valve regurgitation. Tricuspid Valve: Tricuspid valve regurgitation is mild. Aortic Valve: Aortic valve  regurgitation is mild to moderate. Aortic regurgitation PHT measures 343 msec. Additional Comments: Spectral Doppler performed. Color Doppler performed.  LEFT VENTRICLE PLAX 2D LVIDd:         5.10 cm   Diastology LVIDs:         3.30 cm   LV e' medial: 5.55 cm/s LV PW:         0.80 cm LV IVS:        0.80 cm LVOT diam:     2.10 cm LVOT Area:     3.46 cm  RIGHT VENTRICLE RV Basal diam:  2.80 cm RV Mid diam:    2.60 cm TAPSE (M-mode): 1.2 cm LEFT ATRIUM             Index  RIGHT ATRIUM           Index LA diam:        3.10 cm 2.16 cm/m   RA Area:     15.90 cm LA Vol (A2C):   15.7 ml 10.94 ml/m  RA Volume:   40.70 ml  28.35 ml/m LA Vol (A4C):   18.9 ml 13.17 ml/m LA Biplane Vol: 17.5 ml 12.19 ml/m  AORTIC VALVE AI PHT:      343 msec  AORTA Ao Root diam: 3.00 cm TRICUSPID VALVE TR Peak grad:   23.4 mmHg TR Vmax:        242.00 cm/s  SHUNTS Systemic Diam: 2.10 cm Marcina Millard MD Electronically signed by Marcina Millard MD Signature Date/Time: 03/11/2023/12:51:41 PM    Final    EEG adult  Result Date: 03/07/2023 Jefferson Fuel, MD     03/08/2023  3:10 PM Routine EEG Report Zacharias Bergemann is a 77 y.o. male with a history of altered mental status who is undergoing an EEG to evaluate for seizures. Report: This EEG was acquired with electrodes placed according to the International 10-20 electrode system (including Fp1, Fp2, F3, F4, C3, C4, P3, P4, O1, O2, T3, T4, T5, T6, A1, A2, Fz, Cz, Pz). The following electrodes were missing or displaced: none. The occipital dominant rhythm was 4-6 Hz. This activity is reactive to stimulation. There are runs of faster activity most prominent over the left frontal region that are favored to be arousals. Drowsiness was manifested by background fragmentation; deeper stages of sleep were identified by K complexes and sleep spindles. There iws focal slowing over the left frontal region. There were occasional L occipital sharp waves. There were no electrographic seizures  identified. Photic stimulation and hyperventilation were not performed. Impression and clinical correlation: This EEG was obtained while awake and asleep and is abnormal due to: - moderate diffuse slowing indicative of global cerebral dysfunction - left focal slowing indicative of superimposed focal cerebral dysfunction in that region - sharp waves in the left occipital region indicating increased epileptogenicity in that region No electrographic seizures were captured during this recording. Bing Neighbors, MD [facility] [phone] If 7pm- 7am, please page neurology on call as listed in AMION.   DG Chest Port 1 View  Result Date: 03/07/2023 CLINICAL DATA:  Status post right thoracentesis EXAM: PORTABLE CHEST 1 VIEW COMPARISON:  Chest radiograph dated 03/07/2023 FINDINGS: Lines/tubes: Partially imaged inferior approach catheter projects over the left upper quadrant. Chest: Improved lung aeration with persistent left-greater-than-right interstitial and patchy opacities. Pleura: Decreased right pleural effusion. Persistent moderate left pleural effusion. No pneumothorax. Heart/mediastinum: Similar enlarged cardiomediastinal silhouette. Bones: No acute osseous abnormality. IMPRESSION: 1. Decreased right pleural effusion status post thoracentesis. No pneumothorax. 2. Persistent moderate left pleural effusion. 3. Improved lung aeration with persistent left-greater-than-right interstitial and patchy opacities. Electronically Signed   By: Agustin Cree M.D.   On: 03/07/2023 16:35   ECHOCARDIOGRAM LIMITED  Result Date: 03/07/2023    ECHOCARDIOGRAM LIMITED REPORT   Patient Name:   Joanna Mcculley    Date of Exam: 03/07/2023 Medical Rec #:  [id number]  Height:       60.0 in Accession #:    [id number] Weight:       107.8 lb Date of Birth:  [birth year] BSA:          1.435 m Patient Age:    76 years   BP:           92/60 mmHg Patient Gender: M  HR:           87 bpm. Exam Location:  [facility] Procedure: Limited Echo, Color  Doppler and Cardiac Doppler Indications:     Tamponade I31.4  History:         Patient has prior history of Echocardiogram examinations, most                  recent 03/06/2023. CHF; Risk Factors:Hypertension. Tobacco abuse.  Sonographer:     Cristela Blue Referring Phys:  [id number] CARALYN HUDSON Diagnosing Phys: Marcina Millard MD IMPRESSIONS  1. Left ventricular ejection fraction, by estimation, is 60 to 65%. The left ventricle has normal function. The left ventricle has no regional wall motion abnormalities. Indeterminate diastolic filling due to E-A fusion.  2. Right ventricular systolic function is normal. The right ventricular size is normal.  3. Moderate pericardial effusion.  4. The mitral valve is normal in structure. Mild mitral valve regurgitation. No evidence of mitral stenosis.  5. The aortic valve is normal in structure. Aortic valve regurgitation is mild. No aortic stenosis is present.  6. The inferior vena cava is normal in size with greater than 50% respiratory variability, suggesting right atrial pressure of 3 mmHg. FINDINGS  Left Ventricle: Left ventricular ejection fraction, by estimation, is 60 to 65%. The left ventricle has normal function. The left ventricle has no regional wall motion abnormalities. The left ventricular internal cavity size was normal in size. There is  no left ventricular hypertrophy. Indeterminate diastolic filling due to E-A fusion. Right Ventricle: The right ventricular size is normal. No increase in right ventricular wall thickness. Right ventricular systolic function is normal. Left Atrium: Left atrial size was normal in size. Right Atrium: Right atrial size was normal in size. Pericardium: Pericardial effusion much reduced after pericardiocenetesis. No right atrial or right ventricular diastolic collapse. No evidence for preicardial tamonade. A moderately sized pericardial effusion is present. Mitral Valve: The mitral valve is normal in structure. Mild mitral valve  regurgitation. No evidence of mitral valve stenosis. Tricuspid Valve: The tricuspid valve is normal in structure. Tricuspid valve regurgitation is mild . No evidence of tricuspid stenosis. Aortic Valve: The aortic valve is normal in structure. Aortic valve regurgitation is mild. Aortic regurgitation PHT measures 436 msec. No aortic stenosis is present. Pulmonic Valve: The pulmonic valve was normal in structure. Pulmonic valve regurgitation is not visualized. No evidence of pulmonic stenosis. Aorta: The aortic root is normal in size and structure. Venous: The inferior vena cava is normal in size with greater than 50% respiratory variability, suggesting right atrial pressure of 3 mmHg. IAS/Shunts: No atrial level shunt detected by color flow Doppler. Additional Comments: Spectral Doppler performed. Color Doppler performed.  LEFT VENTRICLE PLAX 2D LVIDd:         4.40 cm LVIDs:         2.80 cm LV PW:         0.90 cm LV IVS:        0.80 cm LVOT diam:     2.00 cm LVOT Area:     3.14 cm  LEFT ATRIUM         Index LA diam:    2.80 cm 1.95 cm/m  AORTIC VALVE AI PHT:      436 msec  AORTA Ao Root diam: 3.00 cm MITRAL VALVE                TRICUSPID VALVE MV Area (PHT): 4.99 cm     TR Peak grad:  24.0 mmHg MV Decel Time: 152 msec     TR Vmax:        245.00 cm/s MV E velocity: 126.00 cm/s                             SHUNTS                             Systemic Diam: 2.00 cm Marcina Millard MD Electronically signed by Marcina Millard MD Signature Date/Time: 03/07/2023/1:09:22 PM    Final    CT HEAD WO CONTRAST ( )  Result Date: 03/07/2023 CLINICAL DATA:  Neuro deficit, acute, stroke suspected. Altered mental status. Memory loss. EXAM: CT HEAD WITHOUT CONTRAST TECHNIQUE: Contiguous axial images were obtained from the base of the skull through the vertex without intravenous contrast. RADIATION DOSE REDUCTION: This exam was performed according to the departmental dose-optimization program which includes automated  exposure control, adjustment of the mA and/or kV according to patient size and/or use of iterative reconstruction technique. COMPARISON:  Head CT and MRI 03/05/2023 FINDINGS: Brain: There is no evidence of an acute cortically based infarct, intracranial hemorrhage, midline shift, or extra-axial fluid collection. The ventricles are normal in size. Mild-to-moderate vasogenic edema in the right frontal lobe, left occipital lobe, and right cerebellar hemisphere is unchanged and without significant associated mass effect. Mild to moderate hypodensities elsewhere in the cerebral white matter bilaterally are nonspecific but compatible with chronic small vessel ischemic disease. 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 Vascular: Calcified atherosclerosis at the skull base. No hyperdense vessel. Skull: No acute fracture or suspicious osseous lesion. Sinuses/Orbits: Minimal mucosal thickening in the paranasal sinuses. Clear mastoid air cells. Unremarkable orbits. Other: None. IMPRESSION: 1. Unchanged edema associated with known lesions in the right frontal lobe, left occipital lobe, and right cerebellum. No significant mass effect. 2. No evidence of a new intracranial abnormality. Electronically Signed   By: Sebastian Ache M.D.   On: 03/07/2023 11:23   DG Chest Port 1 View  Result Date: 03/07/2023 CLINICAL DATA:  Pleural effusion. EXAM: PORTABLE CHEST 1 VIEW COMPARISON:  Chest radiograph and CT 03/05/2023 FINDINGS: The cardiac silhouette remains enlarged. Aortic atherosclerosis is noted. Veiling opacities in the lower lungs are consistent with persistent small to moderate pleural effusions as shown on CT. Bilateral interstitial and patchy airspace opacities have slightly worsened. No pneumothorax is identified. IMPRESSION: 1. Slight worsening of bilateral lung opacities which may reflect edema or pneumonia. 2. Persistent bilateral pleural effusions. Electronically Signed   By: Sebastian Ache M.D.   On:  03/07/2023 09:50   US Venous Img Lower Bilateral (DVT)  Result Date: 03/06/2023 CLINICAL DATA:  Lower extremity edema EXAM: BILATERAL LOWER EXTREMITY VENOUS DOPPLER ULTRASOUND TECHNIQUE: Gray-scale sonography with graded compression, as well as color Doppler and duplex ultrasound were performed to evaluate the lower extremity deep venous systems from the level of the common femoral vein and including the common femoral, femoral, profunda femoral, popliteal and calf veins including the posterior tibial, peroneal and gastrocnemius veins when visible. The superficial great saphenous vein was also interrogated. Spectral Doppler was utilized to evaluate flow at rest and with distal augmentation maneuvers in the common femoral, femoral and popliteal veins. COMPARISON:  None Available. FINDINGS: RIGHT LOWER EXTREMITY Common Femoral Vein: No evidence of thrombus. Normal compressibility, respiratory phasicity and response to augmentation. Saphenofemoral Junction: No evidence of thrombus. Normal compressibility and flow on  color Doppler imaging. Profunda Femoral Vein: No evidence of thrombus. Normal compressibility and flow on color Doppler imaging. Femoral Vein: No evidence of thrombus. Normal compressibility, respiratory phasicity and response to augmentation. Popliteal Vein: No evidence of thrombus. Normal compressibility, respiratory phasicity and response to augmentation. Calf Veins: No evidence of thrombus. Normal compressibility and flow on color Doppler imaging. Superficial Great Saphenous Vein: No evidence of thrombus. Normal compressibility. Venous Reflux:  None. Other Findings:  None. LEFT LOWER EXTREMITY Common Femoral Vein: No evidence of thrombus. Normal compressibility, respiratory phasicity and response to augmentation. Saphenofemoral Junction: No evidence of thrombus. Normal compressibility and flow on color Doppler imaging. Profunda Femoral Vein: No evidence of thrombus. Normal compressibility and flow on  color Doppler imaging. Femoral Vein: No evidence of thrombus. Normal compressibility, respiratory phasicity and response to augmentation. Popliteal Vein: No evidence of thrombus. Normal compressibility, respiratory phasicity and response to augmentation. Calf Veins: No evidence of thrombus. Normal compressibility and flow on color Doppler imaging. Superficial Great Saphenous Vein: No evidence of thrombus. Normal compressibility. Venous Reflux:  None. Other Findings:  None. IMPRESSION: No evidence of deep venous thrombosis in either lower extremity. Electronically Signed   By: Alcide Clever M.D.   On: 03/06/2023 19:43   ECHOCARDIOGRAM COMPLETE  Result Date: 03/06/2023    ECHOCARDIOGRAM REPORT   Patient Name:  Beauregard Rosato    Date of      03/06/2023                           Exam: Medical Rec #: [id number]  Height:      60.0 in Accession #:   [id number] Weight:      99.2 lb Date of Birth: 02/01/1946 BSA:         1.386 m Patient Age:   76 years   BP:          Not listed in chart/Not listed in chart                                        mmHg Patient        M          HR:          Not listed in chart bpm. Gender: Exam Location: [facility] Procedure: 2D Echo, Cardiac Doppler and Color Doppler Indications:     Tamponade I31.4  History:         Patient has prior history of Echocardiogram examinations, most                  recent 03/05/2023. CHF; Risk Factors:Hypertension. Tobacco abuse.  Sonographer:     Cristela Blue Referring Phys:  [id number] [facility] MICHELLE TANG Diagnosing Phys: Marcina Millard MD IMPRESSIONS  1. Left ventricular ejection fraction, by estimation, is 60 to 65%. The left ventricle has normal function. The left ventricle has no regional wall motion abnormalities. Left ventricular diastolic parameters are indeterminate.  2. Right ventricular systolic function is normal. The right ventricular size is normal.  3. Successful pericardiocentesis with removal of 1100 cc serosanguinous pericardial fluid.. Findings are consistent  with cardiac tamponade.  4. The mitral valve is normal in structure. No evidence of mitral valve regurgitation. No evidence of mitral stenosis.  5. The aortic valve is normal in structure. Aortic valve regurgitation is not visualized. No aortic stenosis is present.  6. The inferior  vena cava is normal in size with greater than 50% respiratory variability, suggesting right atrial pressure of 3 mmHg. FINDINGS  Left Ventricle: Left ventricular ejection fraction, by estimation, is 60 to 65%. The left ventricle has normal function. The left ventricle has no regional wall motion abnormalities. The left ventricular internal cavity size was normal in size. There is  no left ventricular hypertrophy. Left ventricular diastolic parameters are indeterminate. Right Ventricle: The right ventricular size is normal. No increase in right ventricular wall thickness. Right ventricular systolic function is normal. Left Atrium: Left atrial size was normal in size. Right Atrium: Right atrial size was normal in size. Pericardium: Successful pericardiocentesis with removal of 1100 cc serosanguinous pericardial fluid. There is no evidence of pericardial effusion. The pericardial effusion is circumferential. There is diastolic collapse of the right ventricular free wall. There is evidence of cardiac tamponade. Mitral Valve: The mitral valve is normal in structure. No evidence of mitral valve regurgitation. No evidence of mitral valve stenosis. Tricuspid Valve: The tricuspid valve is normal in structure. Tricuspid valve regurgitation is not demonstrated. No evidence of tricuspid stenosis. Aortic Valve: The aortic valve is normal in structure. Aortic valve regurgitation is not visualized. No aortic stenosis is present. Pulmonic Valve: The pulmonic valve was normal in structure. Pulmonic valve regurgitation is not visualized. No evidence of pulmonic stenosis. Aorta: The aortic root is normal in size and structure. Venous: The inferior vena cava  is normal in size with greater than 50% respiratory variability, suggesting right atrial pressure of 3 mmHg. IAS/Shunts: No atrial level shunt detected by color flow Doppler.  LEFT VENTRICLE PLAX 2D LVIDd:         3.85 cm LVIDs:         3.10 cm LV PW:         0.75 cm LV IVS:        1.10 cm  RIGHT VENTRICLE RV Basal diam:  3.40 cm RV Mid diam:    2.50 cm LEFT ATRIUM         Index       RIGHT ATRIUM           Index LA diam:    4.40 cm 3.18 cm/m  RA Area:     12.50 cm                                 RA Volume:   27.90 ml  20.14 ml/m Marcina Millard MD Electronically signed by Marcina Millard MD Signature Date/Time: 03/06/2023/5:12:27 PM    Final    CARDIAC CATHETERIZATION  Result Date: 03/06/2023 Successful pericardiocentesis with placement of pigtail catheter   ECHOCARDIOGRAM COMPLETE  Result Date: 03/06/2023    ECHOCARDIOGRAM REPORT   Patient Name:   Adonus Doro    Date of Exam: 03/05/2023 Medical Rec #:  [id number]  Height:       57.0 in Accession #:    [id number] Weight:       100.0 lb Date of Birth:  1946/09/15 BSA:          1.340 m Patient Age:    76 years   BP:           123/76 mmHg Patient Gender: M          HR:           74 bpm. Exam Location:  [facility] Procedure: 2D Echo, Cardiac Doppler and Color Doppler Indications:  I31.3 Pericardial Effusion  History:         Patient has prior history of Echocardiogram examinations, most                  recent 07/27/2018. CHF; Risk Factors:Hypertension, Current                  Smoker and Alcohol Abuse.  Sonographer:     Daphine Deutscher RDCS Referring Phys:  [id number] Verdene Lennert Diagnosing Phys: Yvonne Kendall MD IMPRESSIONS  1. Left ventricular ejection fraction, by estimation, is 55 to 60%. The left ventricle has normal function. The left ventricle has no regional wall motion abnormalities. Left ventricular diastolic parameters are consistent with Grade I diastolic dysfunction (impaired relaxation).  2. Right ventricular systolic function is mildly  reduced. The right ventricular size is normal. There is normal pulmonary artery systolic pressure.  3. Large pericardial effusion. The pericardial effusion is circumferential. Findings are consistent with cardiac tamponade.  4. The mitral valve is normal in structure. Mild mitral valve regurgitation.  5. Tricuspid valve regurgitation is mild to moderate.  6. The aortic valve is tricuspid. There is mild calcification of the aortic valve. There is mild thickening of the aortic valve. Aortic valve regurgitation is mild to moderate.  7. The inferior vena cava is dilated in size with <50% respiratory variability, suggesting right atrial pressure of 15 mmHg. Conclusion(s)/Recommendation(s): Findings were conveyed to Dr. Gillis Santa at 8:02 AM on 03/06/2023 by Dr. Cristal [place] End. FINDINGS  Left Ventricle: Left ventricular ejection fraction, by estimation, is 55 to 60%. The left ventricle has normal function. The left ventricle has no regional wall motion abnormalities. The left ventricular internal cavity size was normal in size. There is  no left ventricular hypertrophy. Left ventricular diastolic parameters are consistent with Grade I diastolic dysfunction (impaired relaxation). Right Ventricle: The right ventricular size is normal. No increase in right ventricular wall thickness. Right ventricular systolic function is mildly reduced. There is normal pulmonary artery systolic pressure. The tricuspid regurgitant velocity is 1.71 m/s, and with an assumed right atrial pressure of 15 mmHg, the estimated right ventricular systolic pressure is 26.7 mmHg. Left Atrium: Left atrial size was normal in size. Right Atrium: Right atrial size was normal in size. Pericardium: A large pericardial effusion is present. The pericardial effusion is circumferential. The pericardial effusion appears to contain fibrous material. There is diastolic collapse of the right ventricular free wall and excessive respiratory variation in the mitral  valve spectral Doppler velocities. There is evidence of cardiac tamponade. Mitral Valve: The mitral valve is normal in structure. There is mild thickening of the mitral valve leaflet(s). Mild mitral valve regurgitation. Tricuspid Valve: The tricuspid valve is normal in structure. Tricuspid valve regurgitation is mild to moderate. Aortic Valve: The aortic valve is tricuspid. There is mild calcification of the aortic valve. There is mild thickening of the aortic valve. Aortic valve regurgitation is mild to moderate. Aortic regurgitation PHT measures 507 msec. Aortic valve mean gradient measures 2.5 mmHg. Aortic valve peak gradient measures 4.3 mmHg. Aortic valve area, by VTI measures 2.84 cm. Pulmonic Valve: The pulmonic valve was normal in structure. Pulmonic valve regurgitation is trivial. No evidence of pulmonic stenosis. Aorta: The aortic root and ascending aorta are structurally normal, with no evidence of dilitation. Pulmonary Artery: The pulmonary artery is of normal size. Venous: The inferior vena cava is dilated in size with less than 50% respiratory variability, suggesting right atrial pressure of 15 mmHg. IAS/Shunts: The interatrial septum  was not well visualized.  LEFT VENTRICLE PLAX 2D LVIDd:         3.50 cm   Diastology LVIDs:         2.50 cm   LV e' medial:    4.46 cm/s LV PW:         0.90 cm   LV E/e' medial:  9.7 LV IVS:        0.80 cm   LV e' lateral:   6.31 cm/s LVOT diam:     2.00 cm   LV E/e' lateral: 6.9 LV SV:         46 LV SV Index:   34 LVOT Area:     3.14 cm  RIGHT VENTRICLE RV Basal diam:  3.10 cm RV S prime:     7.29 cm/s TAPSE (M-mode): 1.3 cm LEFT ATRIUM             Index        RIGHT ATRIUM           Index LA diam:        3.10 cm 2.31 cm/m   RA Area:     10.70 cm LA Vol (A2C):   37.7 ml 28.14 ml/m  RA Volume:   27.40 ml  20.45 ml/m LA Vol (A4C):   25.3 ml 18.88 ml/m LA Biplane Vol: 33.9 ml 25.30 ml/m  AORTIC VALVE AV Area (Vmax):    2.86 cm AV Area (Vmean):   2.60 cm AV Area  (VTI):     2.84 cm AV Vmax:           103.69 cm/s AV Vmean:          74.930 cm/s AV VTI:            0.161 m AV Peak Grad:      4.3 mmHg AV Mean Grad:      2.5 mmHg LVOT Vmax:         94.33 cm/s LVOT Vmean:        62.000 cm/s LVOT VTI:          0.145 m LVOT/AV VTI ratio: 0.90 AI PHT:            507 msec  AORTA Ao Root diam: 3.20 cm Ao Asc diam:  3.50 cm MITRAL VALVE               TRICUSPID VALVE MV Area (PHT): 4.28 cm    TR Peak grad:   11.7 mmHg MV Decel Time: 177 msec    TR Vmax:        171.00 cm/s MV E velocity: 43.23 cm/s MV A velocity: 63.73 cm/s  SHUNTS MV E/A ratio:  0.68        Systemic VTI:  0.15 m                            Systemic Diam: 2.00 cm Yvonne Kendall MD Electronically signed by Yvonne Kendall MD Signature Date/Time: 03/06/2023/8:13:55 AM    Final    MR Brain W and Wo Contrast  Result Date: 03/05/2023 CLINICAL DATA:  Mental status change, unknown cause EXAM: MRI HEAD WITHOUT AND WITH CONTRAST TECHNIQUE: Multiplanar, multiecho pulse sequences of the brain and surrounding structures were obtained without and with intravenous contrast. CONTRAST:  5mL GADAVIST GADOBUTROL 1 MMOL/ML IV SOLN COMPARISON:  Same day CT head FINDINGS: Brain: Negative for an acute infarct. No hydrocephalus. No extra-axial fluid collection. Small microhemorrhage  in the right lentiform nucleus. Chronic left cerebellar infarct. There is sequela mild-to-moderate chronic microvascular ischemic change. There are multiple lesions, including- -1.0 x 0.7 cm contrast-enhancing lesion in the right frontal lobe with surrounding vasogenic edema (series 18, image 108) - 0.8 x 0.7 cm contrast-enhancing lesions in in the left occipital lobe with surrounding vasogenic edema (series 18, image 69). -1.2 x 1.1 cm contrast-enhancing lesion in the superior right cerebellum mild surrounding vasogenic edema (series 18, image 50) -0.6 x 0.4 cm dural-based contrast-enhancing lesion along the left frontal convexity (series 93, image 176).  Vascular: Normal flow voids. Skull and upper cervical spine: Normal marrow signal. Sinuses/Orbits: No middle ear or mastoid effusion. Mild mucosal thickening bilateral ethmoid air cells. Orbits are unremarkable. Other: None. IMPRESSION: 1. Multiple contrast-enhancing lesions in the right frontal lobe, left occipital lobe, and superior right cerebellum with surrounding vasogenic edema. 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 2. Small dural-based contrast-enhancing lesion along the left frontal convexity could represent a small meningimoa, but metastatic disease is not excluded. Electronically Signed   By: Lorenza Cambridge M.D.   On: 03/05/2023 16:48   CT CHEST ABDOMEN PELVIS W CONTRAST  Result Date: 03/05/2023 CLINICAL DATA:  Brain mass. Assess for occult malignancy. * Tracking Code: BO * EXAM: CT CHEST, ABDOMEN, AND PELVIS WITH CONTRAST TECHNIQUE: Multidetector CT imaging of the chest, abdomen and pelvis was performed following the standard protocol during bolus administration of intravenous contrast. RADIATION DOSE REDUCTION: This exam was performed according to the departmental dose-optimization program which includes automated exposure control, adjustment of the mA and/or kV according to patient size and/or use of iterative reconstruction technique. CONTRAST:  75mL OMNIPAQUE IOHEXOL 300 MG/ML  SOLN COMPARISON:  Chest CT without contrast 04/28/2015. X-ray chest earlier 03/05/2023 FINDINGS: CT CHEST FINDINGS Cardiovascular: Very large pericardial effusion. The heart itself is nonenlarged. Significant coronary artery calcifications. The thoracic aorta has a normal course and caliber with mild atherosclerotic calcified plaque. Mediastinum/Nodes: Normal caliber thoracic esophagus. Preserved thyroid gland. No specific abnormal lymph node enlargement seen including in the axillary regions, hilum. Prominent right paratracheal node on series 2, image 18 measures 18 by 7 mm. This could be 2 adjacent  nodes. Subcarinal node series 2, image 29 measures 2.7 by 1.1 cm, mildly enlarged. Lungs/Pleura: Moderate right and small left effusion. There are some loculated components on the left with fluid tracking along the interlobar fissure. Adjacent parenchymal opacities. Atelectasis versus infiltrate. There is a opacity specifically in the lingula as well. Scattered ground-glass elsewhere in the visualized lungs. No pneumothorax. No obvious mass. Significant breathing motion throughout the examination. A subtle mass lesion could be obscured by the lung opacity in the effusions. Follow up study could be performed after improvement of the effusions. Musculoskeletal: Mild degenerative changes along the spine. CT ABDOMEN PELVIS FINDINGS Hepatobiliary: Contrast exam in the abdomen is more in the late arterial phase and portal venous phase. Patent portal vein. Gallbladder is present. Small cysts seen in segment 4 of the liver. Pancreas: Unremarkable. No pancreatic ductal dilatation or surrounding inflammatory changes. Spleen: Normal in size without focal abnormality. Adrenals/Urinary Tract: Slight nonspecific thickening of the adrenal glands but unchanged from previous. No enhancing renal mass or collecting system dilatation. There are some small low-attenuation lesions in the left kidney which are too small to completely characterize but likely benign cysts, Bosniak 2 lesions. No specific imaging follow-up. The ureters have normal course and caliber extending down to the bladder. Diffuse wall thickening of the urinary bladder. Stomach/Bowel: On  this non oral contrast exam, large bowel has a normal caliber. Redundant course of the sigmoid colon. There are some areas of mild wall thickening along loops of bowel, nonspecific. The stomach is mildly distended with fluid. Mild fold thickening suggested small bowel is nondilated. Vascular/Lymphatic: Normal caliber aorta and IVC with moderate calcified plaque along the aorta and  iliac vessels. There are several enlarged nodes identified in the retroperitoneum and upper abdomen. Example aortocaval node posteriorly on series 2, image 64 measures 17 by 13 mm. No next of the celiac series 2, image 59 measures 16 by 14 mm. Left para-node anteriorly on series 2, image 68 measures 17 by 8 mm. A few small retrocrural nodes are also identified. Reproductive: Enlarged heterogeneous prostate. Please correlate with patient's PSA. Other: Anasarca.  Mesenteric haziness.  Trace ascites.  No free air Musculoskeletal: Mild degenerative changes along the spine. IMPRESSION: Large pericardial effusion. Please correlate with and new symptoms including for tamponade. Moderate right and small left effusion with some loculated components on the left. Adjacent parenchymal opacities. Atelectasis versus infiltrate. Recommend follow up after clearance of the effusions. Few borderline enlarged lymph nodes identified including mediastinum, upper retroperitoneum. Enlarged prostate with mass effect along the bladder with bladder wall thickening. Please correlate with patient's PSA. No bowel obstruction or free air. There is scattered mesenteric haziness with trace ascites. There are some small areas of wall thickening along the colon but this could simply relate to the other signs of edema. Electronically Signed   By: Karen Kays M.D.   On: 03/05/2023 15:03   DG Chest Port 1 View  Result Date: 03/05/2023 CLINICAL DATA:  Shortness of breath EXAM: PORTABLE CHEST 1 VIEW COMPARISON:  07/27/2018 FINDINGS: Cardiac silhouette is enlarged with diffuse interstitial opacities throughout both lungs compatible with edema related to CHF. Basilar atelectasis noted, worse on the left. Difficult to exclude small effusions. Also left lower lobe retrocardiac opacity obscures the left hemidiaphragm concerning for possible superimposed pneumonia. No pneumothorax. Trachea midline. Aorta atherosclerotic. No acute osseous finding.  IMPRESSION: 1. Cardiomegaly with diffuse interstitial edema pattern. 2. Left lower lobe airspace consolidation concerning for superimposed pneumonia. 3. Suspect small effusions. Electronically Signed   By: Judie Petit.  Shick M.D.   On: 03/05/2023 13:20   CT Head Wo Contrast  Result Date: 03/05/2023 CLINICAL DATA:  Mental status change of unknown cause. Memory disturbance. EXAM: CT HEAD WITHOUT CONTRAST TECHNIQUE: Contiguous axial images were obtained from the base of the skull through the vertex without intravenous contrast. RADIATION DOSE REDUCTION: This exam was performed according to the departmental dose-optimization program which includes automated exposure control, adjustment of the mA and/or kV according to patient size and/or use of iterative reconstruction technique. COMPARISON:  07/27/2018 FINDINGS: Brain: No focal abnormality affects the brainstem. Question mild edema and mass effect in the inferior right cerebellum. 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 There is abnormal vasogenic edema within the right posterior frontal region worrisome for an underlying mass lesion. In the left hemisphere, there is abnormal vasogenic edema in the occipital lobe worrisome for the presence of an occult mass lesion. No visible hemorrhage. Hydrocephalus or extra-axial collection. Vascular: There is atherosclerotic calcification of the major vessels at the base of the brain. Skull: No calvarial abnormality. Sinuses/Orbits: Clear/normal Other: None IMPRESSION: 1. Abnormal vasogenic edema in the right posterior frontal region and in the left occipital lobe worrisome for the presence of occult mass lesions. Question mild edema and mass effect in the inferior right cerebellum. No visible  hemorrhage. MRI with and without contrast is recommended for further evaluation. 2. 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 3. Atherosclerotic calcification of the major vessels at the base of the brain. Electronically  Signed   By: Paulina Fusi M.D.   On: 03/05/2023 12:52

## 2023-03-13 NOTE — Consult Note (Signed)
NEW PATIENT EVALUATION  Name: Cody [name]  MRN: [id number]  Date:   03/13/2023     DOB: [birth year]   This 77 y.o. 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  REFERRING PHYSICIAN: Orson Eva, NP  CHIEF COMPLAINT:  Chief Complaint  Patient presents with   Brain  Mets    Consult    DIAGNOSIS: The encounter diagnosis was Secondary malignant neoplasm of brain (HCC).   PREVIOUS INVESTIGATIONS:  MRI of brain reviewed CT scans reviewed Cytology report reviewed Clinical notes reviewed  HPI: Patient is a 77 year old [place] gentleman admitted through the emergency room for altered mental status.  He was noted to be hyponatremic and CT scan confirmed multiple intracranial lesions consistent with brain metastasis.  Patient on CT scan of his chest showed a large pericardial effusion moderate right small left effusion and some loculated component on the left.  There is also some mediastinal enlarged lymph nodes.  He underwent pericardiocentesis with cytology showing metastatic adenocarcinoma compatible with pulmonary adenocarcinoma.  Thoracentesis was also performed.  Brain MRI showed multiple contrast-enhancing lesions in the right frontal lobe left occipital lobe and superior right cerebellum with surrounding vasogenic edema consistent with intracranial metastatic disease.  He has now been discharged from the hospital seen along with his daughter for evaluation for whole brain radiation.  He is having no focal neurologic deficits his vision is intact.  His headaches have subsided.  He is not ambulating well at this time still has some weakness in his legs.  PLANNED TREATMENT REGIMEN: Whole brain radiation  PAST MEDICAL HISTORY:  has a past medical history of Alcohol dependence (HCC), CHF (congestive heart failure) (HCC), Hypertension, and Tobacco abuse.    PAST SURGICAL HISTORY:  Past Surgical  History:  Procedure Laterality Date   APPENDECTOMY     LEFT HEART CATH AND CORONARY ANGIOGRAPHY N/A 08/25/2018   Procedure: LEFT HEART CATH AND CORONARY ANGIOGRAPHY;  Surgeon: Laurier Nancy, MD;  Location: [facility] INV[facility]IVE CV [facility];  Service: Cardiovascular;  Laterality: N/A;   PERICARDIOCENTESIS N/A 03/06/2023   Procedure: PERICARDIOCENTESIS;  Surgeon: Marcina Millard, MD;  Location: [facility] INVASIVE CV LAB;  Service: Cardiovascular;  Laterality: N/A;    FAMILY HISTORY: family history is not on file.  SOCIAL HISTORY:  reports that he quit smoking 5 days ago. His smoking use included cigarettes. He has a 25.00 pack-year smoking history. He has been exposed to tobacco smoke. He has never used smokeless tobacco. He reports that he does not currently use alcohol. He reports that he does not use drugs.  ALLERGIES: Shellfish allergy  MEDICATIONS:  Current Outpatient Medications  Medication Sig Dispense Refill   allopurinol (ZYLOPRIM) 100 MG tablet Take 1 tablet (100 mg total) by mouth daily. 30 tablet 11   amiodarone (PACERONE) 200 MG tablet Take 1 tablet (200 mg total) by mouth daily. 30 tablet 5   cefUROXime (CEFTIN) 500 MG tablet Take 1 tablet (500 mg total) by mouth 2 (two) times daily with a meal for 3 days. 6 tablet 0   colchicine 0.6 MG tablet Take 1 tablet (0.6 mg total) by mouth daily. 30 tablet 2   dexamethasone (DECADRON) 2 MG tablet Take 1 tablet (2 mg total) by mouth every 12 (twelve) hours. 60 tablet 5   doxycycline (VIBRA-TABS) 100 MG tablet Take 1 tablet (100 mg total) by mouth every 12 (twelve) hours for 3 days. 6 tablet 0   finasteride (PROSCAR) 5  MG tablet Take 1 tablet (5 mg total) by mouth daily. 90 tablet 3   folic acid (FOLVITE) 1 MG tablet Take 1 tablet (1 mg total) by mouth daily. 30 tablet 2   iron polysaccharides (NIFEREX) 150 MG capsule Take 1 capsule (150 mg total) by mouth daily. 30 capsule 2   levETIRAcetam (KEPPRA) 500 MG tablet Take 1 tablet (500 mg total) by mouth  2 (two) times daily. 60 tablet 5   Multiple Vitamin (MULTIVITAMIN WITH MINERALS) TABS tablet Take 1 tablet by mouth daily. 30 tablet 2   polyethylene glycol (MIRALAX / GLYCOLAX) 17 g packet Take 17 g by mouth daily as needed for mild constipation or moderate constipation. 14 each 0   tamsulosin (FLOMAX) 0.4 MG CAPS capsule Take 1 capsule (0.4 mg total) by mouth daily. 90 capsule 3   thiamine (VITAMIN B-1) 100 MG tablet Take 1 tablet (100 mg total) by mouth daily. 30 tablet 2   [START ON 03/17/2023] Vitamin D, Ergocalciferol, (DRISDOL) 1.25 MG (50000 UNIT) CAPS capsule Take 1 capsule (50,000 Units total) by mouth every 7 (seven) days. 12 capsule 0   No current facility-administered medications for this encounter.    ECOG PERFORMANCE STATUS:  2 - Symptomatic, <50% confined to bed  REVIEW OF SYSTEMS: Patient denies any weight loss, fatigue, weakness, fever, chills or night sweats. Patient denies any loss of vision, blurred vision. Patient denies any ringing  of the ears or hearing loss. No irregular heartbeat. Patient denies heart murmur or history of fainting. Patient denies any chest pain or pain radiating to her upper extremities. Patient denies any shortness of breath, difficulty breathing at night, cough or hemoptysis. Patient denies any swelling in the lower legs. Patient denies any nausea vomiting, vomiting of blood, or coffee ground material in the vomitus. Patient denies any stomach pain. Patient states has had normal bowel movements no significant constipation or diarrhea. Patient denies any dysuria, hematuria or significant nocturia. Patient denies any problems walking, swelling in the joints or loss of balance. Patient denies any skin changes, loss of hair or loss of weight. Patient denies any excessive worrying or anxiety or significant depression. Patient denies any problems with insomnia. Patient denies excessive thirst, polyuria, polydipsia. Patient denies any swollen glands, patient denies  easy bruising or easy bleeding. Patient denies any recent infections, allergies or URI. Patient "s visual fields have not changed significantly in recent time.  PHYSICAL EXAM: BP 134/72   Pulse 89   Temp (!) 97.5 F (36.4 C)   Ht 5' (1.524 m)   Wt 110 lb (49.9 kg)   BMI 21.48 kg/m  Well-developed well-nourished patient in NAD. HEENT reveals PERLA, EOMI, discs not visualized.  Oral cavity is clear. No oral mucosal lesions are identified. Neck is clear without evidence of cervical or supraclavicular adenopathy. Lungs are clear to A&P. Cardiac examination is essentially unremarkable with regular rate and rhythm without murmur rub or thrill. Abdomen is benign with no organomegaly or masses noted. Motor sensory and DTR levels are equal and symmetric in the upper and lower extremities. Cranial nerves II through XII are grossly intact. Proprioception is intact. No peripheral adenopathy or edema is identified. No motor or sensory levels are noted. Crude visual fields are within normal range.  LABORATORY DATA: Cytology reviewed compatible with above-stated findings    RADIOLOGY RESULTS: Brain MRI CT scans all reviewed compatible with above-stated findings   IMPRESSION: Stage IV adenocarcinoma the lung with brain metastasis in 77 year old [place] gentleman.  PLAN: This  time of recommended whole brain radiation therapy.  Will plan on delivering 30 Gray in 10 fractions.  Risks and benefits of treatment occluding hair loss possible skin reaction alteration of blood counts all were reviewed in detail with the daughter and the patient.  They both seem to comprehend my treatment plan well.  I have personally set up and ordered CT simulation for tomorrow.  Hopefully will have them under treatment by early next week.  Patient will discuss with his daughter and her family there are treatment options at this time.  They are also meeting with medical oncology today.  I would like to take this opportunity to  thank you for allowing me to participate in the care of your patient.Carmina Miller, MD

## 2023-03-14 ENCOUNTER — Ambulatory Visit
Admission: RE | Admit: 2023-03-14 | Discharge: 2023-03-14 | Disposition: A | Discharge status: Home / Self Care | Payer: Medicare Other | Source: Ambulatory Visit | Attending: Radiation Oncology | Admitting: Radiation Oncology

## 2023-03-14 ENCOUNTER — Encounter: Payer: Self-pay | Admitting: *Deleted

## 2023-03-14 ENCOUNTER — Other Ambulatory Visit: Payer: Medicare Other

## 2023-03-14 DIAGNOSIS — C7931 Secondary malignant neoplasm of brain: Secondary | ICD-10-CM | POA: Insufficient documentation

## 2023-03-14 DIAGNOSIS — C3490 Malignant neoplasm of unspecified part of unspecified bronchus or lung: Secondary | ICD-10-CM | POA: Insufficient documentation

## 2023-03-14 DIAGNOSIS — Z510 Encounter for antineoplastic radiation therapy: Secondary | ICD-10-CM | POA: Insufficient documentation

## 2023-03-14 DIAGNOSIS — Z87891 Personal history of nicotine dependence: Secondary | ICD-10-CM | POA: Insufficient documentation

## 2023-03-14 NOTE — Progress Notes (Signed)
Met with patient and his family member during CT simulation today. No needs or questions during visit. Instructed family to call with any questions or needs.

## 2023-03-15 ENCOUNTER — Other Ambulatory Visit: Payer: Self-pay | Admitting: *Deleted

## 2023-03-15 ENCOUNTER — Telehealth: Payer: Self-pay

## 2023-03-15 DIAGNOSIS — C7931 Secondary malignant neoplasm of brain: Secondary | ICD-10-CM

## 2023-03-15 NOTE — Telephone Encounter (Signed)
Verlon Au with [facility] is called requesting verbal order for  SN and PCS services to be submitted by [place]  [phone]

## 2023-03-18 ENCOUNTER — Ambulatory Visit: Payer: Medicare Other | Admitting: Nurse Practitioner

## 2023-03-18 DIAGNOSIS — Z510 Encounter for antineoplastic radiation therapy: Secondary | ICD-10-CM | POA: Diagnosis not present

## 2023-03-19 ENCOUNTER — Other Ambulatory Visit: Payer: Self-pay

## 2023-03-19 ENCOUNTER — Ambulatory Visit
Admission: RE | Admit: 2023-03-19 | Discharge: 2023-03-19 | Disposition: A | Discharge status: Home / Self Care | Payer: Medicare Other | Source: Ambulatory Visit | Attending: Oncology | Admitting: Oncology

## 2023-03-19 ENCOUNTER — Encounter: Payer: Self-pay | Admitting: Cardiovascular Disease

## 2023-03-19 ENCOUNTER — Ambulatory Visit (INDEPENDENT_AMBULATORY_CARE_PROVIDER_SITE_OTHER): Payer: Medicare Other | Admitting: Cardiovascular Disease

## 2023-03-19 ENCOUNTER — Ambulatory Visit: Admission: RE | Admit: 2023-03-19 | Payer: Medicare Other | Source: Ambulatory Visit

## 2023-03-19 VITALS — BP 130/58 | HR 88 | Ht 59.0 in | Wt 98.6 lb

## 2023-03-19 DIAGNOSIS — C772 Secondary and unspecified malignant neoplasm of intra-abdominal lymph nodes: Secondary | ICD-10-CM | POA: Diagnosis not present

## 2023-03-19 DIAGNOSIS — I509 Heart failure, unspecified: Secondary | ICD-10-CM

## 2023-03-19 DIAGNOSIS — R590 Localized enlarged lymph nodes: Secondary | ICD-10-CM | POA: Insufficient documentation

## 2023-03-19 DIAGNOSIS — E782 Mixed hyperlipidemia: Secondary | ICD-10-CM | POA: Diagnosis not present

## 2023-03-19 DIAGNOSIS — Z8679 Personal history of other diseases of the circulatory system: Secondary | ICD-10-CM

## 2023-03-19 DIAGNOSIS — Z510 Encounter for antineoplastic radiation therapy: Secondary | ICD-10-CM | POA: Diagnosis not present

## 2023-03-19 DIAGNOSIS — C3490 Malignant neoplasm of unspecified part of unspecified bronchus or lung: Secondary | ICD-10-CM | POA: Diagnosis present

## 2023-03-19 DIAGNOSIS — I3139 Other pericardial effusion (noninflammatory): Secondary | ICD-10-CM | POA: Diagnosis not present

## 2023-03-19 LAB — MISC LABCORP TEST (SEND OUT): Labcorp test code: 830893

## 2023-03-19 LAB — GLUCOSE, CAPILLARY: Glucose-Capillary: 85 mg/dL (ref 70–99)

## 2023-03-19 LAB — CULTURE, FUNGUS WITHOUT SMEAR

## 2023-03-19 MED ORDER — FLUDEOXYGLUCOSE F - 18 (FDG) INJECTION
5.3700 | Freq: Once | INTRAVENOUS | Status: AC | PRN
  Administered 2023-03-19: 5.37 via INTRAVENOUS

## 2023-03-19 NOTE — Progress Notes (Signed)
Patient notified

## 2023-03-19 NOTE — Assessment & Plan Note (Addendum)
Developed atrial fib after pericardiocentesis. In sinus rhythm on exam. Continue amiodarone. Not currently on a blood thinner due to recent hemorrhagic pericardial effusion.

## 2023-03-19 NOTE — Assessment & Plan Note (Signed)
Patient denies chest pain, shortness of breath. Will repeat echo to check for recurrent pericardial effusion.

## 2023-03-19 NOTE — Progress Notes (Signed)
Cardiology Office Note   Date:  03/19/2023   ID:  Cody [name] [birth year], MRN [id number]  PCP:  Cody Eva, NP  Cardiologist:  Cody Blackwater, MD      History of Present Illness: Cody [name] is a 77 y.o. male who presents for  Chief Complaint  Patient presents with   Hospitalization Follow-up    Hospital follow up    Patient in office for hospital follow up from pericardial effusion, pericardiocentesis. Daughter is interpreting. Daughter states patient has not smoked or drank alcohol since returning home from hospital. Patient denies chest pain, shortness of breath.     Past Medical History:  Diagnosis Date   Alcohol dependence (HCC)    CHF (congestive heart failure) (HCC)    Hypertension    Tobacco abuse      Past Surgical History:  Procedure Laterality Date   APPENDECTOMY     LEFT HEART CATH AND CORONARY ANGIOGRAPHY N/A 08/25/2018   Procedure: LEFT HEART CATH AND CORONARY ANGIOGRAPHY;  Surgeon: Cody Nancy, MD;  Location: [facility];  Service: Cardiovascular;  Laterality: N/A;   PERICARDIOCENTESIS N/A 03/06/2023   Procedure: PERICARDIOCENTESIS;  Surgeon: Cody Millard, MD;  Location: [facility];  Service: Cardiovascular;  Laterality: N/A;     Current Outpatient Medications  Medication Sig Dispense Refill   allopurinol (ZYLOPRIM) 100 MG tablet Take 1 tablet (100 mg total) by mouth daily. 30 tablet 11   amiodarone (PACERONE) 200 MG tablet Take 1 tablet (200 mg total) by mouth daily. 30 tablet 5   colchicine 0.6 MG tablet Take 1 tablet (0.6 mg total) by mouth daily. 30 tablet 2   dexamethasone (DECADRON) 2 MG tablet Take 1 tablet (2 mg total) by mouth every 12 (twelve) hours. 60 tablet 5   finasteride (PROSCAR) 5 MG tablet Take 1 tablet (5 mg total) by mouth daily. 90 tablet 3   folic acid (FOLVITE) 1 MG tablet Take 1 tablet (1 mg total) by mouth daily. 30 tablet 2   iron polysaccharides (NIFEREX) 150 MG capsule Take 1 capsule (150 mg  total) by mouth daily. 30 capsule 2   levETIRAcetam (KEPPRA) 500 MG tablet Take 1 tablet (500 mg total) by mouth 2 (two) times daily. 60 tablet 5   Multiple Vitamin (MULTIVITAMIN WITH MINERALS) TABS tablet Take 1 tablet by mouth daily. 30 tablet 2   polyethylene glycol (MIRALAX / GLYCOLAX) 17 g packet Take 17 g by mouth daily as needed for mild constipation or moderate constipation. 14 each 0   tamsulosin (FLOMAX) 0.4 MG CAPS capsule Take 1 capsule (0.4 mg total) by mouth daily. 90 capsule 3   thiamine (VITAMIN B-1) 100 MG tablet Take 1 tablet (100 mg total) by mouth daily. 30 tablet 2   Vitamin D, Ergocalciferol, (DRISDOL) 1.25 MG (50000 UNIT) CAPS capsule Take 1 capsule (50,000 Units total) by mouth every 7 (seven) days. 12 capsule 0   No current facility-administered medications for this visit.    Allergies:   Shellfish allergy    Social History:   reports that he quit smoking 11 days ago. His smoking use included cigarettes. He has a 25.00 pack-year smoking history. He has been exposed to tobacco smoke. He has never used smokeless tobacco. He reports that he does not currently use alcohol. He reports that he does not use drugs.   Family History:  family history is not on file.    ROS:     Review of Systems  Constitutional:  Negative.   HENT: Negative.    Eyes: Negative.   Respiratory: Negative.    Cardiovascular: Negative.   Gastrointestinal: Negative.   Genitourinary: Negative.   Musculoskeletal: Negative.   Skin: Negative.   Neurological: Negative.   Endo/Heme/Allergies: Negative.   Psychiatric/Behavioral: Negative.    All other systems reviewed and are negative.    All other systems are reviewed and negative.    PHYSICAL EXAM: VS:  BP (!) 130/58   Pulse 88   Ht 4\' 11"  (1.499 m)   Wt 98 lb 9.6 oz (44.7 kg)   SpO2 100%   BMI 19.91 kg/m  , BMI Body mass index is 19.91 kg/m. Last weight:  Wt Readings from Last 3 Encounters:  03/19/23 98 lb 9.6 oz (44.7 kg)   03/13/23 110 lb (49.9 kg)  03/13/23 110 lb (49.9 kg)    Physical Exam Vitals reviewed.  Constitutional:      Appearance: Normal appearance. He is normal weight.  HENT:     Head: Normocephalic.     Nose: Nose normal.     Mouth/Throat:     Mouth: Mucous membranes are moist.  Eyes:     Pupils: Pupils are equal, round, and reactive to light.  Cardiovascular:     Rate and Rhythm: Normal rate and regular rhythm.     Pulses: Normal pulses.     Heart sounds: Normal heart sounds.  Pulmonary:     Effort: Pulmonary effort is normal.  Abdominal:     General: Abdomen is flat. Bowel sounds are normal.  Musculoskeletal:        General: Normal range of motion.     Cervical back: Normal range of motion.  Skin:    General: Skin is warm.  Neurological:     General: No focal deficit present.     Mental Status: He is alert.  Psychiatric:        Mood and Affect: Mood normal.     EKG: none today  Recent Labs: 03/05/2023: B Natriuretic Peptide 99.4 03/06/2023: TSH 0.532 03/12/2023: ALT 106; BUN 19; Creatinine, Ser 0.68; Hemoglobin 11.1; Magnesium 1.9; Platelets 267; Potassium 4.2; Sodium 131    Lipid Panel    Component Value Date/Time   CHOL 168 12/19/2022 0841   TRIG 122 12/19/2022 0841   HDL 42 12/19/2022 0841   LDLCALC 104 (H) 12/19/2022 0841      Other studies Reviewed: Patient: [id number] - Cody [name] DOB:  1946-06-28  Date:  07/25/2021 10:30 Provider: Adrian Blackwater MD Encounter: ECHO   Page 2 REASON FOR VISIT  Visit for: Echocardiogram/Essential primary hypertension  Sex:   Male         wt=  102  lbs.  BP= 132/60  Height= 60   inches.   TESTS  Imaging: Echocardiogram:  An echocardiogram in (2-d) mode was performed and in Doppler mode with color flow velocity mapping was performed. The aortic valve cusps are abnormal 1.7  cm, flow velocity .882   m/s, and systolic calculated mean flow gradient 2  mmHg. Mitral valve diastolic peak flow velocity E .39    m/s and E/A ratio  0.6. Aortic root diameter 3.5   cm. The LVOT internal diameter 2.3   cm and flow velocity was abnormal .759    m/s. LV systolic dimension 2.3   cm, diastolic 3.42  cm, posterior wall thickness 1.14    cm, fractional shortening 32.7 %, and EF 62.4 %. IVS thickness .963    cm. LA dimension 4.1 cm.  Mitral Valve has Mild Regurgitation. Aortic Valve has Mild Regurgitation. Pulmonic Valve has Trace Regurgitation. Tricuspid Valve has Trace Regurgitation.     ASSESSMENT  Technically adequate study.  Normal chamber sizes.  Normal left ventricular systolic function.  Mild left ventricular hypertrophy with GRADE 1 (relaxation abnormality) diastolic dysfunction.  Normal right ventricular systolic function.  Normal right ventricular diastolic function.  Right ventricular diastolic dysfunction.  Normal left ventricular wall motion.  Normal right ventricular wall motion.  Trace pulmonary regurgitation.  Trace tricuspid regurgitation.  Mild pulmonary hypertension.  Mild mitral regurgitation.  Mild aortic regurgitation.  No pericardial effusion.  Mildly dilated Left atrium  Normal Right atrium  Normal Right and Left ventricle  Mildy dilated Ao root and ascending Ao  No LVH.   THERAPY   Referring physician: Laurier [name]  Sonographer: Cody [name].   Cody Blackwater MD  Electronically signed by: Cody [name]     Date: 07/27/2021 11:18  Patient: [id number] - Delia Lutterman DOB:  1946/05/02  Date:  04/26/2020 07:45 Provider: Adrian Blackwater MD Encounter: [facility] STRESS TEST   Page 1 TESTS    [facility] [address] [place], [place] [address] [phone] STUDY:  Gated Stress / Rest Myocardial Perfusion Imaging Tomographic (SPECT) Including attenuation correction Wall Motion, Left Ventricular Ejection Fraction By Gated Technique.Persantine Stress Test. SEX: Male        WEIGHT: 100 lbs   HEIGHT: 60 in                 ARMS UP: YES/NO                                                                                                                                                                                 REFERRING PHYSICIAN: Dr.Abir Craine Welton Flakes  INDICATION FOR STUDY: SOB.                                                                                                                                                                                                                     TECHNIQUE:  Approximately 20 minutes following the intravenous administration of 11.5 mCi of Tc-32m Sestamibi after stress testing in a reclined supine position with arms above their head if able to Symir so, gated SPECT imaging of the heart was performed. After about a 2hr break, the patient was injected intravenously with 35.1 mCi of Tc-21m Sestamibi.  Approximately 45 minutes later in the same position as stress imaging SPECT rest imaging of the heart was performed.  STRESS BY:  Cody Blackwater, MD PROTOCOL:  Persantine    DOSE ADMIN: 5.2 cc      ROUTE OF ADMINISTRATION: IV                                                                            MAX PRED HR: 148                     85%: 126               75%: 111                                                                                                                   RESTING BP: 124/70    RESTING HR: 61   PEAK BP: 98/58     PEAK HR:  83  EXERCISE DURATION:    4 min injection                                            REASON FOR TEST TERMINATION:    Protocol end                                                                                                                              SYMPTOMS:   None                                                                                                                                                                                                           EKG RESULTS:  NSR. 64/min. Occasional APC's. No changes with persantine.                                                             IMAGE QUALITY: Good  PERFUSION/WALL MOTION FINDINGS: EF = 74%. No perfusion defects, normal wall motion.                                                                           IMPRESSION: Normal stress test with normal LVEF.                                                                                                                                                                                                                                                                                         Cody Blackwater, MD Stress Interpreting Physician / Nuclear Interpreting Physician        Cody Blackwater MD  Electronically signed by: Cody [name]     Date: 04/28/2020 11:37  Patient: [id number] - Morty Hoeger DOB:  1945/11/29  Date:  08/04/2018 10:30 Provider: Adrian Blackwater MD Encounter: ALL ANGIOGRAMS (CTA BRAIN, CAROTIDS, RENAL ARTERIES, PE)  TESTS  Imaging: Computed Tomographic Angiography:  Cardiac multidetector CT was performed paying particular attention to the coronary arteries for the diagnosis of: CAD. Diagnostic Drugs:  Administered iohexol (Omnipaque) through an antecubital vein and images from the examination were analyzed for the presence and extent of coronary artery disease, using 3D image processing software. 100 mL of non-ionic contrast (Omnipaque) was used.    ASSESSMENT   The left main artery was abnormal:2  The  proximal LAD artery are abnormal:2  The mid-LAD artery was abnormal:3, Calcified  The distal LAD artery was abnormal:2  The proximal right coronary artery (RCA) was abnormal:1  The mid right coronary artery (RCA) was abnormal:2, Calcified  The distal right coronary artery (RCA) was abnormal:2  The posterior descending coronary arteries were abnormal:2    Postero-Lateral Branch: 2     TEST CONCLUSIONS  Quality of study: Good.  1-Calcium score: 1834.8  2-Right dominant system  3-Normal LCX, Severely calcified proximal to mid  LAD and RCA with mild mid RCA disease and moderate to severe mid LAD disease. Consider Cath.   Cody Blackwater MD  Electronically signed by: Cody [name]     Date: 08/05/2018 09:22   ASSESSMENT AND PLAN:    ICD-10-CM   1. Pericardial effusion  I31.39 PCV ECHOCARDIOGRAM COMPLETE    2. Congestive heart failure, unspecified HF chronicity, unspecified heart failure type (HCC)  I50.9     3. Mixed hyperlipidemia  E78.2     4. Atrial fibrillation, currently in sinus rhythm  Z86.79        Problem List Items Addressed This Visit       Cardiovascular and Mediastinum   Pericardial effusion - Primary (Chronic)    Patient denies chest pain, shortness of breath. Will repeat echo to check for recurrent pericardial effusion.       Relevant Orders   PCV ECHOCARDIOGRAM COMPLETE   Congestive heart failure (HCC)     Other   Mixed hyperlipidemia   Atrial fibrillation, currently in sinus rhythm    Developed atrial fib after pericardiocentesis. In sinus rhythm on exam. Continue amiodarone. Not currently on a blood thinner due to recent hemorrhagic pericardial effusion.         Disposition:   Return in about 4 weeks (around 04/16/2023) for echo prior.    Total time spent: 30 minutes  Signed,  Cody Blackwater, MD  03/19/2023 11:07 AM    [facility]

## 2023-03-20 ENCOUNTER — Other Ambulatory Visit: Payer: Self-pay

## 2023-03-20 ENCOUNTER — Telehealth: Payer: Self-pay | Admitting: Nurse Practitioner

## 2023-03-20 ENCOUNTER — Ambulatory Visit
Admission: RE | Admit: 2023-03-20 | Discharge: 2023-03-20 | Disposition: A | Discharge status: Home / Self Care | Payer: Medicare Other | Source: Ambulatory Visit | Attending: Radiation Oncology | Admitting: Radiation Oncology

## 2023-03-20 DIAGNOSIS — Z510 Encounter for antineoplastic radiation therapy: Secondary | ICD-10-CM | POA: Diagnosis not present

## 2023-03-20 LAB — RAD ONC ARIA SESSION SUMMARY
Course Elapsed Days: 0
Plan Fractions Treated to Date: 1
Plan Prescribed Dose Per Fraction: 3 Gy
Plan Total Fractions Prescribed: 10
Plan Total Prescribed Dose: 30 Gy
Reference Point Dosage Given to Date: 3 Gy
Reference Point Session Dosage Given: 3 Gy
Session Number: 1

## 2023-03-20 LAB — CULTURE, FUNGUS WITHOUT SMEAR

## 2023-03-20 NOTE — Telephone Encounter (Signed)
Cody [name] with OT for [facility] left VM to inform us that family member, Irving Burton, declined OT services for the patient. Just FYI.

## 2023-03-21 ENCOUNTER — Inpatient Hospital Stay: Payer: Medicare Other

## 2023-03-21 ENCOUNTER — Other Ambulatory Visit: Payer: Self-pay

## 2023-03-21 ENCOUNTER — Ambulatory Visit
Admission: RE | Admit: 2023-03-21 | Discharge: 2023-03-21 | Disposition: A | Discharge status: Home / Self Care | Payer: Medicare Other | Source: Ambulatory Visit | Attending: Radiation Oncology | Admitting: Radiation Oncology

## 2023-03-21 DIAGNOSIS — Z510 Encounter for antineoplastic radiation therapy: Secondary | ICD-10-CM | POA: Diagnosis not present

## 2023-03-21 DIAGNOSIS — C7931 Secondary malignant neoplasm of brain: Secondary | ICD-10-CM

## 2023-03-21 LAB — CBC (CANCER CENTER ONLY)
HCT: 35.2 % — ABNORMAL LOW (ref 39.0–52.0)
Hemoglobin: 11.6 g/dL — ABNORMAL LOW (ref 13.0–17.0)
MCH: 31.5 pg (ref 26.0–34.0)
MCHC: 33 g/dL (ref 30.0–36.0)
MCV: 95.7 fL (ref 80.0–100.0)
Platelet Count: 241 10*3/uL (ref 150–400)
RBC: 3.68 MIL/uL — ABNORMAL LOW (ref 4.22–5.81)
RDW: 14.6 % (ref 11.5–15.5)
WBC Count: 17.6 10*3/uL — ABNORMAL HIGH (ref 4.0–10.5)
nRBC: 0 % (ref 0.0–0.2)

## 2023-03-21 LAB — RAD ONC ARIA SESSION SUMMARY
Course Elapsed Days: 1
Plan Fractions Treated to Date: 2
Plan Prescribed Dose Per Fraction: 3 Gy
Plan Total Fractions Prescribed: 10
Plan Total Prescribed Dose: 30 Gy
Reference Point Dosage Given to Date: 6 Gy
Reference Point Session Dosage Given: 3 Gy
Session Number: 2

## 2023-03-21 MED ORDER — TAMSULOSIN HCL 0.4 MG PO CAPS: 0.4000 mg | ORAL_CAPSULE | Freq: Every day | ORAL | 3 refills | Status: DC

## 2023-03-21 NOTE — Telephone Encounter (Signed)
Report sent to scan

## 2023-03-22 ENCOUNTER — Ambulatory Visit
Admission: RE | Admit: 2023-03-22 | Discharge: 2023-03-22 | Disposition: A | Discharge status: Home / Self Care | Payer: Medicare Other | Source: Ambulatory Visit | Attending: Radiation Oncology | Admitting: Radiation Oncology

## 2023-03-22 ENCOUNTER — Other Ambulatory Visit: Payer: Self-pay

## 2023-03-22 DIAGNOSIS — Z510 Encounter for antineoplastic radiation therapy: Secondary | ICD-10-CM | POA: Diagnosis not present

## 2023-03-22 LAB — RAD ONC ARIA SESSION SUMMARY
Course Elapsed Days: 2
Plan Fractions Treated to Date: 3
Plan Prescribed Dose Per Fraction: 3 Gy
Plan Total Fractions Prescribed: 10
Plan Total Prescribed Dose: 30 Gy
Reference Point Dosage Given to Date: 9 Gy
Reference Point Session Dosage Given: 3 Gy
Session Number: 3

## 2023-03-22 LAB — CULTURE, FUNGUS WITHOUT SMEAR

## 2023-03-26 ENCOUNTER — Encounter: Payer: Self-pay | Admitting: Oncology

## 2023-03-26 ENCOUNTER — Ambulatory Visit (INDEPENDENT_AMBULATORY_CARE_PROVIDER_SITE_OTHER): Payer: Medicare Other | Admitting: Nurse Practitioner

## 2023-03-26 ENCOUNTER — Ambulatory Visit
Admission: RE | Admit: 2023-03-26 | Discharge: 2023-03-26 | Disposition: A | Discharge status: Home / Self Care | Payer: Medicare Other | Source: Ambulatory Visit | Attending: Radiation Oncology | Admitting: Radiation Oncology

## 2023-03-26 ENCOUNTER — Other Ambulatory Visit: Payer: Self-pay | Admitting: *Deleted

## 2023-03-26 ENCOUNTER — Other Ambulatory Visit: Payer: Self-pay

## 2023-03-26 VITALS — BP 132/68 | HR 105 | Ht <= 58 in | Wt 100.0 lb

## 2023-03-26 DIAGNOSIS — R600 Localized edema: Secondary | ICD-10-CM | POA: Insufficient documentation

## 2023-03-26 DIAGNOSIS — C3490 Malignant neoplasm of unspecified part of unspecified bronchus or lung: Secondary | ICD-10-CM

## 2023-03-26 DIAGNOSIS — N401 Enlarged prostate with lower urinary tract symptoms: Secondary | ICD-10-CM

## 2023-03-26 DIAGNOSIS — C7931 Secondary malignant neoplasm of brain: Secondary | ICD-10-CM

## 2023-03-26 DIAGNOSIS — I509 Heart failure, unspecified: Secondary | ICD-10-CM

## 2023-03-26 DIAGNOSIS — R338 Other retention of urine: Secondary | ICD-10-CM

## 2023-03-26 DIAGNOSIS — Z510 Encounter for antineoplastic radiation therapy: Secondary | ICD-10-CM | POA: Diagnosis not present

## 2023-03-26 LAB — RAD ONC ARIA SESSION SUMMARY
Course Elapsed Days: 6
Plan Fractions Treated to Date: 4
Plan Prescribed Dose Per Fraction: 3 Gy
Plan Total Fractions Prescribed: 10
Plan Total Prescribed Dose: 30 Gy
Reference Point Dosage Given to Date: 12 Gy
Reference Point Session Dosage Given: 3 Gy
Session Number: 4

## 2023-03-26 MED ORDER — FLUCONAZOLE 100 MG PO TABS: 100.0000 mg | ORAL_TABLET | Freq: Every day | ORAL | 0 refills | Status: DC

## 2023-03-26 NOTE — Progress Notes (Signed)
Established Patient Office Visit  Subjective:  Patient ID: Cody [name], male    DOB: 07/20/1946  Age: 77 y.o. MRN: [id number]  Chief Complaint  Patient presents with   Hospitalization Follow-up    Hospital Follow Up    Hospital follow up.  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  Patient's daughter, Irving Burton, present during office visit today.  Patient reports he has not had any beer or cigarettes since his hospitalization.  Patient has a HH SN come in once per week.  Irving Burton has an appt with oncologist next week to discuss possible palliative chemotherapy.  Patient is already started RT.  Right foot is edematous, nontender, hx of gout, will xray.    No other concerns at this time.   Past Medical History:  Diagnosis Date   Alcohol dependence (HCC)    CHF (congestive heart failure) (HCC)    Hypertension    Tobacco abuse     Past Surgical History:  Procedure Laterality Date   APPENDECTOMY     LEFT HEART CATH AND CORONARY ANGIOGRAPHY N/A 08/25/2018   Procedure: LEFT HEART CATH AND CORONARY ANGIOGRAPHY;  Surgeon: Laurier Nancy, MD;  Location: [facility] INVASIVE [facility] LAB;  Service: Cardiovascular;  Laterality: N/A;   PERICARDIOCENTESIS N/A 03/06/2023   Procedure: PERICARDIOCENTESIS;  Surgeon: Marcina Millard, MD;  Location: [facility] INVASIVE CV LAB;  Service: Cardiovascular;  Laterality: N/A;    Social History   Socioeconomic History   Marital status: Married    Spouse name: Not on file   Number of children: Not on file   Years of education: Not on file   Highest education level: Not on file  Occupational History   Not on file  Tobacco Use   Smoking status: Former    Packs/day: 0.50    Years: 50.00    Additional pack years: 0.00    Total pack years: 25.00    Types: Cigarettes    Quit date: 03/08/2023    Years since quitting: 0.0    Passive exposure: Current   Smokeless tobacco: Never  Vaping Use   Vaping Use: Never used  Substance and Sexual Activity   Alcohol use: Not  Currently    Comment: stopping drinking approx 03/03/23   Drug use: Never   Sexual activity: Not Currently    Birth control/protection: None  Other Topics Concern   Not on file  Social History Narrative   Not on file   Social Determinants of Health   Financial Resource Strain: Low Risk  (08/25/2018)   Overall Financial Resource Strain (CARDIA)    Difficulty of Paying Living Expenses: Not very hard  Food Insecurity: Not on file  Transportation Needs: Unknown (08/25/2018)   PRAPARE - Transportation    Lack of Transportation (Medical): No    Lack of Transportation (Non-Medical): Not on file  Physical Activity: Not on file  Stress: No Stress Concern Present (08/25/2018)   [employer] of Occupational Health - Occupational Stress Questionnaire    Feeling of Stress : Only a little  Social Connections: Not on file  Intimate Partner Violence: Not on file    No family history on file.  Allergies  Allergen Reactions   Shellfish Allergy Hives    Only allergic to crabs    Review of Systems  Constitutional: Negative.   HENT: Negative.    Eyes: Negative.   Respiratory:  Positive for shortness of breath.   Cardiovascular:  Positive for leg swelling.  Gastrointestinal: Negative.   Genitourinary: Negative.  Musculoskeletal:  Positive for joint pain and myalgias.  Skin: Negative.   Neurological: Negative.   Endo/Heme/Allergies: Negative.   Psychiatric/Behavioral: Negative.         Objective:   BP 132/68   Pulse (!) 105   Ht 4\' 10"  (1.473 m)   Wt 100 lb (45.4 kg)   SpO2 98%   BMI 20.90 kg/m   Vitals:   03/26/23 1014  BP: 132/68  Pulse: (!) 105  Height: 4\' 10"  (1.473 m)  Weight: 100 lb (45.4 kg)  SpO2: 98%  BMI (Calculated): 20.91    Physical Exam Vitals and nursing note reviewed.  Constitutional:      Appearance: Normal appearance.  HENT:     Head: Normocephalic.     Nose: Nose normal.     Mouth/Throat:     Mouth: Mucous membranes are moist.  Eyes:      Pupils: Pupils are equal, round, and reactive to light.  Cardiovascular:     Rate and Rhythm: Normal rate and regular rhythm.  Pulmonary:     Effort: Pulmonary effort is normal.     Breath sounds: Wheezing present.  Abdominal:     General: Bowel sounds are normal.     Palpations: Abdomen is soft.  Musculoskeletal:        General: Swelling present.     Cervical back: Normal range of motion and neck supple.  Skin:    General: Skin is warm and dry.  Neurological:     Mental Status: He is alert. Mental status is at baseline.  Psychiatric:        Mood and Affect: Mood normal.        Behavior: Behavior normal.      No results found for any visits on 03/26/23.  Recent Results (from the past 2160 hour(s))  Comprehensive metabolic panel     Status: Abnormal   Collection Time: 03/05/23 11:12 AM  Result Value Ref Range   Sodium 117 (LL) 135 - 145 mmol/L    Comment: CRITICAL RESULT CALLED TO, READ BACK BY AND VERIFIED WITH SAMANTHA HAMILTON 03/05/23 @ 1151 BY SB    Potassium 4.9 3.5 - 5.1 mmol/L   Chloride 87 (L) 98 - 111 mmol/L   CO2 18 (L) 22 - 32 mmol/L   Glucose, Bld 154 (H) 70 - 99 mg/dL    Comment: Glucose reference range applies only to samples taken after fasting for at least 8 hours.   BUN 39 (H) 8 - 23 mg/dL   Creatinine, Ser 2.95 0.61 - 1.24 mg/dL   Calcium 8.5 (L) 8.9 - 10.3 mg/dL   Total Protein 7.5 6.5 - 8.1 g/dL   Albumin 3.0 (L) 3.5 - 5.0 g/dL   AST 621 (H) 15 - 41 U/L    Comment: RESULT CONFIRMED BY MANUAL DILUTION   SB   ALT 214 (H) 0 - 44 U/L   Alkaline Phosphatase 84 38 - 126 U/L   Total Bilirubin 1.0 0.3 - 1.2 mg/dL   GFR, Estimated >30 >86 mL/min    Comment: (NOTE) Calculated using the CKD-EPI Creatinine Equation (2021)    Anion gap 12 5 - 15    Comment: Performed at [facility], [address] [place], [place] [address]  CBC     Status: Abnormal   Collection Time: 03/05/23 11:12 AM  Result Value Ref Range   WBC 13.2 (H) 4.0 - 10.5  K/uL   RBC 3.51 (L) 4.22 - 5.81 MIL/uL   Hemoglobin 10.9 (L)  13.0 - 17.0 g/dL   HCT 47.8 (L) 29.5 - 62.1 %   MCV 93.4 80.0 - 100.0 fL   MCH 31.1 26.0 - 34.0 pg   MCHC 33.2 30.0 - 36.0 g/dL   RDW 30.8 65.7 - 84.6 %   Platelets 357 150 - 400 K/uL   nRBC 0.0 0.0 - 0.2 %    Comment: Performed at [facility], [address], [place], [place] [address]  Brain natriuretic peptide     Status: None   Collection Time: 03/05/23 11:12 AM  Result Value Ref Range   B Natriuretic Peptide 99.4 0.0 - 100.0 pg/mL    Comment: Performed at [facility], [address] [place], [place] [address]  Blood culture (routine x 2)     Status: None   Collection Time: 03/05/23  2:10 PM   Specimen: BLOOD  Result Value Ref Range   Specimen Description BLOOD BLOOD RIGHT ARM    Special Requests      BOTTLES DRAWN AEROBIC AND ANAEROBIC Blood Culture adequate volume   Culture      NO GROWTH 5 DAYS Performed at [facility], [address] [place], [place] [address]    Report Status 03/10/2023 FINAL   Blood culture (routine x 2)     Status: None   Collection Time: 03/05/23  2:10 PM   Specimen: BLOOD  Result Value Ref Range   Specimen Description BLOOD BLOOD LEFT ARM    Special Requests      BOTTLES DRAWN AEROBIC AND ANAEROBIC Blood Culture adequate volume   Culture      NO GROWTH 5 DAYS Performed at [facility], [address], [place], [place] [address]    Report Status 03/10/2023 FINAL   Osmolality, urine     Status: None   Collection Time: 03/05/23  2:12 PM  Result Value Ref Range   Osmolality, Ur 694 300 - 900 mOsm/kg    Comment: REPEATED TO VERIFY Performed at [facility], [address] [place], [place] [address]   Sodium, urine, random     Status: None   Collection Time: 03/05/23  2:12 PM  Result Value Ref Range   Sodium, Ur <10 mmol/L    Comment: Performed at [facility], [address] [place], [place] [address]  Osmolality      Status: None   Collection Time: 03/05/23  4:56 PM  Result Value Ref Range   Osmolality 276 275 - 295 mOsm/kg    Comment: REPEATED TO VERIFY Performed at [facility], [address] [place], [place] [address]   Sodium     Status: Abnormal   Collection Time: 03/05/23  4:56 PM  Result Value Ref Range   Sodium 118 (LL) 135 - 145 mmol/L    Comment: CRITICAL RESULT CALLED TO, READ BACK BY AND VERIFIED WITH Sydell Axon [facility] AT 1747 ON 03/05/23 BY SS Performed at [facility], [address] [place], [place] [address]   Lactate dehydrogenase     Status: Abnormal   Collection Time: 03/05/23  4:56 PM  Result Value Ref Range   LDH 694 (H) 98 - 192 U/L    Comment: Performed at [facility], [address] [place], [place] [address]  ECHOCARDIOGRAM COMPLETE     Status: None   Collection Time: 03/05/23  7:54 PM  Result Value Ref Range   Weight 1,600 oz   BP 137/80 mmHg   Ao pk vel 1.04 m/s  AV Area VTI 2.84 cm2   AR max vel 2.86 cm2   AV Mean grad 2.5 mmHg   AV Peak grad 4.3 mmHg   S' Lateral 2.50 cm   AV Area mean vel 2.60 cm2   Area-P 1/2 4.28 cm2   P 1/2 time 507 msec   Est EF 55 - 60%   Sodium     Status: Abnormal   Collection Time: 03/05/23 10:13 PM  Result Value Ref Range   Sodium 123 (L) 135 - 145 mmol/L    Comment: Performed at [facility], [address] [place], [place] [address]  Glucose, capillary     Status: Abnormal   Collection Time: 03/05/23 10:57 PM  Result Value Ref Range   Glucose-Capillary 119 (H) 70 - 99 mg/dL    Comment: Glucose reference range applies only to samples taken after fasting for at least 8 hours.  Comprehensive metabolic panel     Status: Abnormal   Collection Time: 03/06/23  2:58 AM  Result Value Ref Range   Sodium 124 (L) 135 - 145 mmol/L   Potassium 5.0 3.5 - 5.1 mmol/L   Chloride 95 (L) 98 - 111 mmol/L   CO2 17 (L) 22 - 32 mmol/L   Glucose, Bld 129 (H) 70 - 99 mg/dL    Comment: Glucose  reference range applies only to samples taken after fasting for at least 8 hours.   BUN 42 (H) 8 - 23 mg/dL   Creatinine, Ser 6.04 0.61 - 1.24 mg/dL   Calcium 8.5 (L) 8.9 - 10.3 mg/dL   Total Protein 6.7 6.5 - 8.1 g/dL   Albumin 2.7 (L) 3.5 - 5.0 g/dL   AST 540 (H) 15 - 41 U/L   ALT 217 (H) 0 - 44 U/L   Alkaline Phosphatase 82 38 - 126 U/L   Total Bilirubin 0.9 0.3 - 1.2 mg/dL   GFR, Estimated >98 >11 mL/min    Comment: (NOTE) Calculated using the CKD-EPI Creatinine Equation (2021)    Anion gap 12 5 - 15    Comment: Performed at [facility], [address] [place], [place] [address]  Protime-INR     Status: Abnormal   Collection Time: 03/06/23  2:58 AM  Result Value Ref Range   Prothrombin Time 18.3 (H) 11.4 - 15.2 seconds   INR 1.5 (H) 0.8 - 1.2    Comment: (NOTE) INR goal varies based on device and disease states. Performed at [facility], [address] [place], [place] [address]   CBC with Differential/Platelet     Status: Abnormal   Collection Time: 03/06/23  2:58 AM  Result Value Ref Range   WBC 11.3 (H) 4.0 - 10.5 K/uL   RBC 3.52 (L) 4.22 - 5.81 MIL/uL   Hemoglobin 10.9 (L) 13.0 - 17.0 g/dL   HCT 13.0 (L) 86.5 - 78.4 %   MCV 91.5 80.0 - 100.0 fL   MCH 31.0 26.0 - 34.0 pg   MCHC 33.9 30.0 - 36.0 g/dL   RDW 69.6 29.5 - 28.4 %   Platelets 309 150 - 400 K/uL   nRBC 0.2 0.0 - 0.2 %   Neutrophils Relative % 82 %   Neutro Abs 9.2 (H) 1.7 - 7.7 K/uL   Lymphocytes Relative 10 %   Lymphs Abs 1.2 0.7 - 4.0 K/uL   Monocytes Relative 5 %   Monocytes Absolute 0.6 0.1 - 1.0 K/uL   Eosinophils Relative 1 %   Eosinophils Absolute 0.1 0.0 -  0.5 K/uL   Basophils Relative 0 %   Basophils Absolute 0.0 0.0 - 0.1 K/uL   Immature Granulocytes 2 %   Abs Immature Granulocytes 0.27 (H) 0.00 - 0.07 K/uL    Comment: Performed at [facility], [address] [place], [place] [address]  Uric acid     Status: Abnormal   Collection Time: 03/06/23  2:58 AM   Result Value Ref Range   Uric Acid, Serum 9.7 (H) 3.7 - 8.6 mg/dL    Comment: Performed at [facility], [address] [place], [place] [address]  TSH     Status: None   Collection Time: 03/06/23  2:58 AM  Result Value Ref Range   TSH 0.532 0.350 - 4.500 uIU/mL    Comment: Performed by a 3rd Generation assay with a functional sensitivity of <=0.01 uIU/mL. Performed at [facility], [address] [place], [place] [address]   Cortisol     Status: None   Collection Time: 03/06/23  2:58 AM  Result Value Ref Range   Cortisol, Plasma 8.2 ug/dL    Comment: (NOTE) AM    6.7 - 22.6 ug/dL PM   <34.7       ug/dL Performed at [facility], [address] [place], [place] [address]   Sodium     Status: Abnormal   Collection Time: 03/06/23  8:49 AM  Result Value Ref Range   Sodium 125 (L) 135 - 145 mmol/L    Comment: Performed at [facility], [address] [place], [place] [address]  Flow cytometry panel-leukemia/lymphoma work-up     Status: None   Collection Time: 03/06/23  8:49 AM  Result Value Ref Range   PATH INTERP XXX-IMP Comment     Comment: No significant immunophenotypic abnormality detected.   CLINICAL INFO Comment     Comment: (NOTE) Accompanying CBC dated 03/06/2023 shows: WBC count 11.3, Neu 9.2, Lym 1.2, Mon 0.6    Specimen Type Comment     Comment: Peripheral blood   ASSESSMENT OF LEUKOCYTES Comment     Comment: (NOTE) No monoclonal B cell population is detected. kappa:lambda ratio 2.0 There is no loss of, or aberrant expression of, the pan T cell antigens to suggest a neoplastic T cell process. CD4:CD8 ratio 5.2 No circulating blasts are detected. Rare granulocytes show left shifted maturation. Monocytes show aberrant expression of CD56 in a subset and downregulation of CD11c, a nonspecific finding that can be seen in association with both reactive/activated processes as well as neoplastic processes. Analysis of the  leukocyte population shows: granulocytes 90%, monocytes 5%, lymphocytes 5%, blasts <0.1%, B cells 1%, T cells 4%, NK cells <1%    % Viable Cells Comment     Comment: 98%   ANALYSIS AND GATING STRATEGY Comment     Comment:  8 color analysis with CD45/SCC gating   IMMUNOPHENOTYPING STUDY Comment     Comment: (NOTE) CD2       Normal         CD3       Normal CD4       Normal         CD5       Normal CD7       Normal         CD8       Normal CD10      Normal         CD11b     Normal CD13  Normal         CD14      Normal CD16      Normal         CD19      Normal CD20      Normal         CD33      Normal CD34      Normal         CD38      Normal CD45      Normal         CD56      See Text CD57      Normal         CD117     Normal HLA-DR    Normal         KAPPA     Normal LAMBDA    Normal         CD64      Normal CD11c     See Text    PATHOLOGIST NAME Comment     Comment: Sumner Boast, M.D.   COMMENT: Comment     Comment: (NOTE) Each antibody in this assay was utilized to assess for potential abnormalities of studied cell populations or to characterize identified abnormalities. This test was developed and its performance characteristics determined by [facility].  It has not been cleared or approved by the U.S. Food and Drug Administration. The FDA has determined that such clearance or approval is not necessary. This test is used for clinical purposes.  It should not be regarded as investigational or for research. Performed At: -[facility] [address] [place], [place] [id number] Maurine Simmering MDPhD WU:[id number] Performed At: [facility] [address] [place], [place] [id number] Maurine Simmering MDPhD [id number]   Magnesium     Status: Abnormal   Collection Time: 03/06/23  8:49 AM  Result Value Ref Range   Magnesium 2.5 (H) 1.7 - 2.4 mg/dL    Comment: Performed at [facility], [address] [place], [place] [address]  Phosphorus     Status: None    Collection Time: 03/06/23  8:49 AM  Result Value Ref Range   Phosphorus 4.3 2.5 - 4.6 mg/dL    Comment: Performed at [facility], [address] [place], [place] [address]  Cytology - Non PAP;     Status: None   Collection Time: 03/06/23  9:34 AM  Result Value Ref Range   CYTOLOGY - NON GYN      CYTOLOGY - NON PAP CASE: [id number] PATIENT: Oswin Pacey Non-Gynecological Cytology Report     Specimen Submitted: A. Pericardial fluid  Clinical History: None provided    DIAGNOSIS: A. PERICARDIAL FLUID; PERICARDIOCENTESIS: - POSITIVE FOR MALIGNANCY. - METASTATIC ADENOCARCINOMA, COMPATIBLE WITH PULMONARY ADENOCARCINOMA.  Comment: Immunohistochemical studies show tumor cells to be positive for CK7 and TTF-1, and negative for p40. These findings confirm the above diagnosis.   There is sufficient tissue within the cell block for ancillary testing.  Slides reviewed: 1 ThinPrep, 1 cell block, 2 cytospin  IHC slides were prepared by [facility] Path[facility], [facility], [place]. All controls stained appropriately.  This test was developed and its performance characteristics determined by [facility]. It has not been cleared or approved by the [place] Food and Drug Administration. The FDA does not require this test to go through premarket FDA review. Th is test is used for clinical purposes. It should not be regarded as investigational  or for research. This laboratory is certified under the Clinical Laboratory Improvement Amendments (CLIA) as qualified to perform high complexity clinical laboratory testing.  GROSS DESCRIPTION: A. Labeled: Labeled with the patient's name and date of birth (per requisition pericardial fluid) Received: Fresh Collection time: 10:22 AM on 03/06/2023 Placed into formalin time: Not applicable Volume: Approximately 15 mL Description of fluid and container in which it is received: Received in a clear plastic syringe is dark red, opaque  fluid Cytospin slide(s) received: Yes, 2  Specimen material submitted for: Cell block and ThinPrep  The cell block material is fixed in formalin for 6 hours prior to processing.  CM 03/06/2023  Final Diagnosis performed by Katherine Mantle, MD.   Electronically signed 03/08/2023 1:50:11PM The electronic signature indicates that the named Attending Pathologist has  evaluated the specimen Technical component performed at [facility], [address] [place], [place] [address] Lab: [phone] Dir: Jolene Schimke, MD, MMM  Professional component performed at [facility], [address] [place], [place], [place] [address] Lab: [phone] Dir: Beryle Quant, MD   Protein, body fluid (other)     Status: None   Collection Time: 03/06/23  9:34 AM  Result Value Ref Range   Total Protein, Body Fluid Other 5.6 g/dL    Comment: (NOTE)             _________________________________________            : BODY FLUID TYPE :     TOTAL PROTEIN     :            :_________________:_______________________:            : Amniotic Fluid  :               <0.4    :            :_________________:_______________________:            :                 : Nonmalignant: <3.0    :            : Ascitic Fluid   : Malignant:    >3.0    :            :_________________:_______________________:            : Bile, Clear     :               <0.9    :            :_________________:_______________________:            : Bile, Yellow    :          0.2 - 0.6    :            :_________________:_______________________:            : Lymph           :          2.2 - 6.0    :            :_________________:_______________________:            : Human Milk      :          1.9 - 2.0    :            :_________________: ______________________:            :  Nasal Secretion :          0.1 - 3.5    :            :_________________:___________ ____________:            : Pancreatic      :          0.0 - 0.1    :            :  Juice           :    (post stimulation) :            :_________________:_______________________:            :                 : Transudate:   <0.3    :            : Pleural Fluid   : Exudate:      >0.3    :            :_________________:_______________________:            : Saliva          :          0.1 - 0.2    :            : (Mixed Glands)  :                       :            :_________________:_______________________:            : Synovial Fluid  :               <2.5    :            :_________________:_______________________:            : Tears           :          0.8 - 0.9    :            :_________________:_______________________:             Aliene Altes, Merrie Roof Reference Intervals             for  Adults and Children 2008. Ninth             Edition (V9.1) [employer],             [place]; [place]: July 2009. Performed At: [facility] [address] [place], [place] [id number] Jolene Schimke MD WU:[id number]    Source of Sample PERICARDIAL     Comment: Performed at [facility], [address] [place], [place] [address]  LD, Body Fluid (other)     Status: None   Collection Time: 03/06/23  9:34 AM  Result Value Ref Range   Source of Sample PERICARDIAL     Comment: Performed at [facility], [address] [place], [place] [address]   LD, Body Fluid 654 IU/L    Comment: (NOTE)             _________________________________________            : BODY FLUID TYPE :          LDH          :            :  _________________:_______________________:            :                 : Nonmalignant:  < 60%  :            :                 :  of the serum LDH     :            : Ascitic Fluid   : Malignant:     > 60%  :            :                 :  of the serum LDH     :            :_________________:_______________________:            : Gastric Juice   :                < 35   :            :_________________:_______________________:             :                 : Transudate:    <200   :            : Pleural Fluid   : Exudate:       >200   :            :_________________:_______________________:            : Saliva          :           113 - 609   :            : (Mixed Glands)  :                       :            :_________________:_______________________:            : Synovial Fluid  :                <240   :            :_________________:___________ ____________:             Aliene Altes, Merrie Roof. Reference Intervals             for Adults and Children 2008. Ninth             Edition (V9.1) [employer],             [place]; [place]: July 2009. Performed At: [facility] [address] [place], [place] [id number] Jolene Schimke MD WU:[id number]   Body fluid culture w Gram Stain     Status: None   Collection Time: 03/06/23  9:34 AM   Specimen: PATH Cytology Misc. fluid; Body Fluid  Result Value Ref Range   Specimen Description      PERICARDIAL Performed at [facility], [address], [place], [place] [address]    Special Requests      PERICARDIAL Performed at [facility], [address] [place], [place] [address]    Gram Stain NO WBC SEEN NO ORGANISMS SEEN     Culture  NO GROWTH 3 DAYS Performed at [facility], [address] [place], [place] [address]    Report Status 03/10/2023 FINAL   Body fluid cell count with differential     Status: Abnormal   Collection Time: 03/06/23 10:22 AM  Result Value Ref Range   Fluid Type-FCT PERICARDIAL     Comment: CORRECTED ON 05/08 AT 1100: PREVIOUSLY REPORTED AS CYTO MISC   Color, Fluid RED (A) YELLOW   Appearance, Fluid BLOODY (A) CLEAR   Total Nucleated Cell Count, Fluid 3,883 cu mm   Neutrophil Count, Fluid 46 %   Lymphs, Fluid 24 %   Monocyte-Macrophage-Serous Fluid 26 %   Eos, Fluid 4 %    Comment: Performed at [facility], [address] [place], [place] [address]  Glucose, Body Fluid Other      Status: None   Collection Time: 03/06/23 10:22 AM  Result Value Ref Range   Glucose, Body Fluid Other 23 mg/dL    Comment: (NOTE)             _________________________________________            : BODY FLUID TYPE :        GLUCOSE        :            :_________________:_______________________:            : Amniotic Fluid  :        45 - 76        :            :_________________:_______________________:            : Bile, Clear     :            < 5        :            :_________________:_______________________:            : Bile, Yellow    :            < 8        :            :_________________:_______________________:            : Lymph           :        48 - 200       :            :_________________:_______________________:            : Nasal Secretion :            < 10       :            :_________________:_______________________:            : Pleural Fluid   :        65 -  99       :            :_________________:_______________________:            : Saliva          :            <  2       :            : (Mixed Glands)  :                       :            :  _________________:___________ ____________:            : Sweat           :            <  7       :            :_________________:_______________________:            : Synovial Fluid  :        65 -  99       :            :_________________:_______________________:            : Tears           :        76 - 288       :            :_________________:_______________________:             Aliene Altes, Ehrhardt V. Reference Intervals             for Adults and Children 2008. Ninth             edition (V9.1) [employer],             [place]; [place]: July 2009. Performed At: [facility] [address] [place], [place] [id number] Jolene Schimke MD NW:[id number]    Source of Sample PERICARDIAL     Comment: Performed at [facility], [address] [place], [place] [address]  ECHOCARDIOGRAM COMPLETE      Status: None   Collection Time: 03/06/23 10:47 AM  Result Value Ref Range   Weight 1,587.31 oz   Height 60 in   BP 135/75 mmHg   S' Lateral 3.10 cm   Est EF 60 - 65%   Sodium     Status: Abnormal   Collection Time: 03/06/23  2:55 PM  Result Value Ref Range   Sodium 126 (L) 135 - 145 mmol/L    Comment: Performed at [facility], [address] [place], [place] [address]  Sodium     Status: Abnormal   Collection Time: 03/06/23  6:06 PM  Result Value Ref Range   Sodium 130 (L) 135 - 145 mmol/L    Comment: Performed at [facility], [address] [place], [place] [address]  Sodium     Status: Abnormal   Collection Time: 03/06/23 10:50 PM  Result Value Ref Range   Sodium 129 (L) 135 - 145 mmol/L    Comment: Performed at [facility], [address], [place], [place] [address]  Basic metabolic panel     Status: Abnormal   Collection Time: 03/07/23  3:08 AM  Result Value Ref Range   Sodium 130 (L) 135 - 145 mmol/L   Potassium 4.5 3.5 - 5.1 mmol/L   Chloride 102 98 - 111 mmol/L   CO2 18 (L) 22 - 32 mmol/L   Glucose, Bld 100 (H) 70 - 99 mg/dL    Comment: Glucose reference range applies only to samples taken after fasting for at least 8 hours.   BUN 43 (H) 8 - 23 mg/dL   Creatinine, Ser 0.27 0.61 - 1.24 mg/dL   Calcium 8.0 (L) 8.9 - 10.3 mg/dL   GFR, Estimated >25 >36 mL/min    Comment: (NOTE) Calculated using the CKD-EPI Creatinine Equation (2021)    Anion gap 10 5 - 15    Comment: Performed  at [facility], [address] [place], [place] [address]  CBC     Status: Abnormal   Collection Time: 03/07/23  3:08 AM  Result Value Ref Range   WBC 14.2 (H) 4.0 - 10.5 K/uL   RBC 3.20 (L) 4.22 - 5.81 MIL/uL   Hemoglobin 10.0 (L) 13.0 - 17.0 g/dL   HCT 60.4 (L) 54.0 - 98.1 %   MCV 96.9 80.0 - 100.0 fL   MCH 31.3 26.0 - 34.0 pg   MCHC 32.3 30.0 - 36.0 g/dL   RDW 19.1 47.8 - 29.5 %   Platelets 274 150 - 400 K/uL   nRBC 0.1 0.0 - 0.2 %     Comment: Performed at [facility], [address] [place], [place] [address]  Hepatic function panel     Status: Abnormal   Collection Time: 03/07/23  3:08 AM  Result Value Ref Range   Total Protein 5.5 (L) 6.5 - 8.1 g/dL   Albumin 2.3 (L) 3.5 - 5.0 g/dL   AST 865 (H) 15 - 41 U/L   ALT 142 (H) 0 - 44 U/L   Alkaline Phosphatase 59 38 - 126 U/L   Total Bilirubin 0.7 0.3 - 1.2 mg/dL   Bilirubin, Direct 0.1 0.0 - 0.2 mg/dL   Indirect Bilirubin 0.6 0.3 - 0.9 mg/dL    Comment: Performed at [facility], [address] [place], [place] [address]  Magnesium     Status: Abnormal   Collection Time: 03/07/23  3:08 AM  Result Value Ref Range   Magnesium 2.5 (H) 1.7 - 2.4 mg/dL    Comment: Performed at [facility], [address], [place], [place] [address]  Phosphorus     Status: None   Collection Time: 03/07/23  3:08 AM  Result Value Ref Range   Phosphorus 4.5 2.5 - 4.6 mg/dL    Comment: Performed at [facility], [address] [place], [place] [address]  Sodium     Status: Abnormal   Collection Time: 03/07/23  7:19 AM  Result Value Ref Range   Sodium 129 (L) 135 - 145 mmol/L    Comment: Performed at [facility], [address] [place], [place] [address]  ECHOCARDIOGRAM LIMITED     Status: None   Collection Time: 03/07/23  8:23 AM  Result Value Ref Range   Weight 1,724.88 oz   Height 60 in   BP 92/60 mmHg   S' Lateral 2.80 cm   Area-P 1/2 4.99 cm2   P 1/2 time 436 msec   Est EF 60 - 65%   Blood gas, arterial     Status: Abnormal   Collection Time: 03/07/23 11:59 AM  Result Value Ref Range   O2 Content 4.0 L/min   pH, Arterial 7.45 7.35 - 7.45   pCO2 arterial 29 (L) 32 - 48 mmHg   pO2, Arterial 87 83 - 108 mmHg   Bicarbonate 20.2 20.0 - 28.0 mmol/L   Acid-base deficit 2.6 (H) 0.0 - 2.0 mmol/L   O2 Saturation 98.2 %   Patient temperature 37.0    Collection site LEFT BRACHIAL     Comment: Performed at [facility],  [address] [place], [place] [address]  Sodium     Status: Abnormal   Collection Time: 03/07/23 12:21 PM  Result Value Ref Range   Sodium 131 (L) 135 - 145 mmol/L    Comment: Performed at [facility], [address], [place], [place] [address]  Pleural fluid culture w Gram Stain     Status: None   Collection Time: 03/07/23  4:00 PM   Specimen: Pleural Fluid  Result Value Ref Range   Specimen Description      PLEURAL Performed at [facility], [address] [place], [place] [address]    Special Requests      NONE Performed at [facility], [address] [place], [place] [address]    Gram Stain      FEW WBC PRESENT,BOTH PMN AND MONONUCLEAR NO ORGANISMS SEEN    Culture      NO GROWTH 3 DAYS Performed at [facility], [address], [place], [place] [address]    Report Status 03/11/2023 FINAL   Pleural fluid cell count with differential     Status: Abnormal   Collection Time: 03/07/23  4:00 PM  Result Value Ref Range   Fluid Type-FCT PLEURAL    Color, Fluid ORANGE (A) YELLOW   Appearance, Fluid CLOUDY (A) CLEAR   Total Nucleated Cell Count, Fluid 588 cu mm   Neutrophil Count, Fluid 25 %   Lymphs, Fluid 49 %   Monocyte-Macrophage-Serous Fluid 26 %   Eos, Fluid 0 %    Comment: Performed at [facility], [address] [place], [place] [address]  Glucose, pleural fluid     Status: None   Collection Time: 03/07/23  4:00 PM  Result Value Ref Range   Glucose, Fluid 94 mg/dL    Comment: (NOTE) No normal range established for this test Results should be evaluated in conjunction with serum values    Fluid Type-FGLU PLEURAL     Comment: Performed at [facility], [address] [place], [place] [address]  Protein, pleural fluid     Status: None   Collection Time: 03/07/23  4:00 PM  Result Value Ref Range   Total protein, fluid <3.0 g/dL    Comment: (NOTE) No normal range established for this test Results  should be evaluated in conjunction with serum values    Fluid Type-FTP PLEURAL     Comment: Performed at [facility], [address] [place], [place] [address]  Lactate dehydrogenase, pleural fluid     Status: Abnormal   Collection Time: 03/07/23  4:00 PM  Result Value Ref Range   LD, Fluid 114 (H) 3 - 23 U/L    Comment: (NOTE) Results should be evaluated in conjunction with serum values    Fluid Type-FLDH PLEURAL     Comment: Performed at [facility], [address] [place], [place] [address]  Amylase, pleural fluid     Status: None   Collection Time: 03/07/23  4:00 PM  Result Value Ref Range   Amylase, Fluid 7 U/L    Comment: NO NORMAL RANGE ESTABLISHED FOR THIS TEST   Fluid Type-FAMY PLEURAL     Comment: Performed at [facility], [address] [place], [place] [address]  Triglycerides, Pleural Fluid     Status: None   Collection Time: 03/07/23  4:00 PM  Result Value Ref Range   Triglycerides, Fluid 15 Not Estab. mg/dL    Comment: (NOTE) The reference interval(s) and other method performance specifications have not been established for this body fluid. The test result must be integrated into the clinical context for interpretation. Performed At: [facility] [address] [place], [place] [id number] Jolene Schimke MD NU:[id number]    Fluid Type-FTRIG PLEURAL     Comment: Performed at [facility], [address] [place], [place] [address]  Cholesterol, pleural fluid     Status: None   Collection Time: 03/07/23  4:00 PM  Result Value Ref Range   Cholesterol, Fluid 30 mg/dL    Comment: (NOTE) INTERPRETIVE INFORMATION: Cholesterol, Body Fluid For information on body fluid reference ranges and/or interpretive guidance visit [email or web address] This test was developed and its performance characteristics determined by [facility]. It has not been cleared or approved by the [place] Food and Drug  Administration. This test was performed in a CLIA certified laboratory and is intended for clinical purposes. Performed At: [facility] [address] [place], [place] [id number] Evans Lance MDPhD XB:[id number]    Chol, Fluid Type PLEURAL     Comment: Performed at [facility], [address] [place], [place] [address]  Sodium     Status: Abnormal   Collection Time: 03/07/23  4:01 PM  Result Value Ref Range   Sodium 131 (L) 135 - 145 mmol/L    Comment: Performed at [facility], [address] [place], [place] [address]  Cytology - Non PAP; pleural fluid     Status: None   Collection Time: 03/07/23  4:02 PM  Result Value Ref Range   CYTOLOGY - NON GYN      CYTOLOGY - NON PAP CASE: [id number] PATIENT: Temesgen Sunderland Non-Gynecological Cytology Report     Specimen Submitted: A. Pleural fluid, right  Clinical History: 24M presents with bilateral pleural effusions    DIAGNOSIS: A. PLEURAL FLUID, RIGHT; ULTRASOUND-GUIDED THORACENTESIS: - NEGATIVE FOR MALIGNANCY. - BLOOD, MIXED INFLAMMATORY CELLS, AND ABUNDANT MACROPHAGES.  Comment: Given the patient's previous positive pericardial fluid evaluation ([id number]), as well as focal atypical cells in a background of significant inflammation, immunohistochemical studies were performed. Stains for BerEP4 and TTF-1 are negative. There is no definite immunophenotypic evidence of metastatic adenocarcinoma in this sampled pleural fluid.  IHC slides were prepared by [facility], [place], [place]. All controls stained appropriately.  This test was developed and its performance characteristics determined by [facility]. It has not been cleared or approve d by the [place] Food and Drug Administration. The FDA does not require this test to go through premarket FDA review. This test is used for clinical purposes. It should not be regarded as investigational or for research.  This laboratory is certified under the Clinical Laboratory Improvement Amendments (CLIA) as qualified to perform high complexity clinical laboratory testing.  GROSS DESCRIPTION: A. Labeled: Received labeled the patient's name date of birth (per requisition pleural fluid, right pleural effusion) Received: Fresh Collection time: 4:02 PM on 03/07/2023 Placed into formalin time: Not applicable Volume: Approximately 80 mL Description of fluid and container in which it is received: Received in a clear plastic specimen container with a white screw top lid is yellow-red cloudy fluid. Cytospin slide(s) received: 1  Specimen material submitted for: Cell block and ThinPrep  The cell block material is fixed in formalin for 6 hours prior to processing.  RB 5/10/202 4  Final Diagnosis performed by Katherine Mantle, MD.   Electronically signed 03/12/2023 12:49:09PM The electronic signature indicates that the named Attending Pathologist has evaluated the specimen Technical component performed at [facility], [address], [place], [place] [address] Lab: [phone] Dir: Jolene Schimke, MD, MMM  Professional component performed at [facility], [address] [place], [place], [place] [address] Lab: [phone] Dir: Beryle Quant, MD   Miscellaneous [facility] test (send-out)  Status: None   Collection Time: 03/07/23  4:02 PM  Result Value Ref Range   [facility] test code [id number]     Comment: Performed at [facility], [address] [place], [place] [address]   [facility] test name ADA DEAMINASE PL FLUID     Comment: Performed at [facility], [address], [place], [place] [address]   Misc [facility] result See Scanned report in Ada Link     Comment: Performed at [facility], [address] [place], [place] [address]  Acid Fast Smear (AFB)     Status: None   Collection Time: 03/07/23  4:06 PM   Specimen: Pleural, Right; Pleural Fluid  Result Value  Ref Range   AFB Specimen Processing Concentration    Acid Fast Smear Negative     Comment: (NOTE) Performed At: [facility] [address] [place], [place] [id number] Jolene Schimke MD NU:[id number]    Source (AFB) PLEURAL     Comment: Performed at [facility], [address] [place], [place] [address]  Culture, fungus without smear     Status: None (Preliminary result)   Collection Time: 03/07/23  4:06 PM   Specimen: Pleural Fluid  Result Value Ref Range   Specimen Description      PLEURAL Performed at [facility], [address] [place], [place] [address]    Special Requests      NONE Performed at [facility], [address], [place], [place] [address]    Culture      NO FUNGUS ISOLATED AFTER 18 DAYS Performed at [facility], [address], [place], [place] [address]    Report Status PENDING   Hematocrit, Pleural Fluid     Status: Abnormal   Collection Time: 03/07/23  4:28 PM  Result Value Ref Range   HCT 32.6 (L) 39.0 - 52.0 %    Comment: Performed at [facility], [address] [place], [place] [address]  Albumin, Pleural Fluid     Status: Abnormal   Collection Time: 03/07/23  4:28 PM  Result Value Ref Range   Albumin 2.4 (L) 3.5 - 5.0 g/dL    Comment: Performed at [facility], [address] [place], [place] [address]  Cryptococcus Antigen, Serum     Status: None   Collection Time: 03/07/23  4:28 PM  Result Value Ref Range   Cryptococcus Antigen, Serum Negative Negative    Comment: (NOTE) Performed At: [facility] [address] [place], [place] [id number] Jolene Schimke MD TF:[id number]   Lactate dehydrogenase     Status: Abnormal   Collection Time: 03/07/23  4:28 PM  Result Value Ref Range   LDH 282 (H) 98 - 192 U/L    Comment: Performed at [facility], [address] [place], [place] [address]  Protein, total     Status: Abnormal   Collection Time: 03/07/23   4:28 PM  Result Value Ref Range   Total Protein 5.8 (L) 6.5 - 8.1 g/dL    Comment: Performed at [facility], [address] [place], [place] [address]  Strep pneumoniae urinary antigen     Status: None   Collection Time: 03/07/23  6:43 PM  Result Value Ref Range   Strep Pneumo Urinary Antigen NEGATIVE NEGATIVE    Comment:        Infection due to S. pneumoniae cannot be absolutely ruled out since the antigen present may be below the detection limit  of the test. Performed at [facility], [address], [place], [place] [address]   Legionella Pneumophila Serogp 1 Ur Ag     Status: None   Collection Time: 03/07/23  6:43 PM  Result Value Ref Range   L. pneumophila Serogp 1 Ur Ag Negative Negative    Comment: (NOTE) Presumptive negative for L. pneumophila serogroup 1 antigen in urine, suggesting no recent or current infection. Legionnaires' disease cannot be ruled out since other serogroups and species may also cause disease. Performed At: [facility] [address] [place], [place] [id number] Jolene Schimke MD XB:[id number]    Source of Sample URINE, RANDOM     Comment: Performed at [facility], [address] [place], [place] [address]  Basic metabolic panel     Status: Abnormal   Collection Time: 03/08/23  5:39 AM  Result Value Ref Range   Sodium 134 (L) 135 - 145 mmol/L   Potassium 3.7 3.5 - 5.1 mmol/L   Chloride 107 98 - 111 mmol/L   CO2 23 22 - 32 mmol/L   Glucose, Bld 73 70 - 99 mg/dL    Comment: Glucose reference range applies only to samples taken after fasting for at least 8 hours.   BUN 28 (H) 8 - 23 mg/dL   Creatinine, Ser 7.84 0.61 - 1.24 mg/dL   Calcium 7.7 (L) 8.9 - 10.3 mg/dL   GFR, Estimated >69 >62 mL/min    Comment: (NOTE) Calculated using the CKD-EPI Creatinine Equation (2021)    Anion gap 4 (L) 5 - 15    Comment: Performed at [facility], [address] [place], [place] [address]  CBC     Status:  Abnormal   Collection Time: 03/08/23  5:39 AM  Result Value Ref Range   WBC 12.1 (H) 4.0 - 10.5 K/uL   RBC 3.13 (L) 4.22 - 5.81 MIL/uL   Hemoglobin 9.7 (L) 13.0 - 17.0 g/dL   HCT 13.2 (L) 44.0 - 10.2 %   MCV 97.1 80.0 - 100.0 fL   MCH 31.0 26.0 - 34.0 pg   MCHC 31.9 30.0 - 36.0 g/dL   RDW 72.5 36.6 - 44.0 %   Platelets 260 150 - 400 K/uL   nRBC 0.0 0.0 - 0.2 %    Comment: Performed at [facility], [address] [place], [place] [address]  Hepatic function panel     Status: Abnormal   Collection Time: 03/08/23  5:39 AM  Result Value Ref Range   Total Protein 5.2 (L) 6.5 - 8.1 g/dL   Albumin 2.2 (L) 3.5 - 5.0 g/dL   AST 90 (H) 15 - 41 U/L   ALT 122 (H) 0 - 44 U/L   Alkaline Phosphatase 52 38 - 126 U/L   Total Bilirubin 0.7 0.3 - 1.2 mg/dL   Bilirubin, Direct 0.1 0.0 - 0.2 mg/dL   Indirect Bilirubin 0.6 0.3 - 0.9 mg/dL    Comment: Performed at [facility], [address] [place], [place] [address]  Magnesium     Status: None   Collection Time: 03/08/23  5:39 AM  Result Value Ref Range   Magnesium 2.3 1.7 - 2.4 mg/dL    Comment: Performed at [facility], [address] [place], [place] [address]  Phosphorus     Status: None   Collection Time: 03/08/23  5:39 AM  Result Value Ref Range   Phosphorus 3.1 2.5 - 4.6 mg/dL    Comment: Performed at [facility], [address], [place], [place] [address]  Basic metabolic panel     Status: Abnormal   Collection Time: 03/09/23  4:13 AM  Result Value Ref Range   Sodium 133 (L) 135 - 145 mmol/L   Potassium 3.9 3.5 - 5.1 mmol/L   Chloride 107 98 - 111 mmol/L   CO2 22 22 - 32 mmol/L   Glucose, Bld 129 (H) 70 - 99 mg/dL    Comment: Glucose reference range applies only to samples taken after fasting for at least 8 hours.   BUN 20 8 - 23 mg/dL   Creatinine, Ser 2.13 0.61 - 1.24 mg/dL   Calcium 8.1 (L) 8.9 - 10.3 mg/dL   GFR, Estimated >08 >65 mL/min    Comment: (NOTE) Calculated using the  CKD-EPI Creatinine Equation (2021)    Anion gap 4 (L) 5 - 15    Comment: Performed at [facility], [address] [place], [place] [address]  CBC     Status: Abnormal   Collection Time: 03/09/23  4:13 AM  Result Value Ref Range   WBC 11.5 (H) 4.0 - 10.5 K/uL   RBC 3.38 (L) 4.22 - 5.81 MIL/uL   Hemoglobin 10.4 (L) 13.0 - 17.0 g/dL   HCT 62.9 (L) 52.8 - 41.3 %   MCV 95.9 80.0 - 100.0 fL   MCH 30.8 26.0 - 34.0 pg   MCHC 32.1 30.0 - 36.0 g/dL   RDW 24.4 01.0 - 27.2 %   Platelets 281 150 - 400 K/uL   nRBC 0.0 0.0 - 0.2 %    Comment: Performed at [facility], [address] [place], [place] [address]  Hepatic function panel     Status: Abnormal   Collection Time: 03/09/23  4:13 AM  Result Value Ref Range   Total Protein 5.6 (L) 6.5 - 8.1 g/dL   Albumin 2.2 (L) 3.5 - 5.0 g/dL   AST 75 (H) 15 - 41 U/L   ALT 117 (H) 0 - 44 U/L   Alkaline Phosphatase 63 38 - 126 U/L   Total Bilirubin 0.7 0.3 - 1.2 mg/dL   Bilirubin, Direct 0.1 0.0 - 0.2 mg/dL   Indirect Bilirubin 0.6 0.3 - 0.9 mg/dL    Comment: Performed at [facility], [address] [place], [place] [address]  Magnesium     Status: None   Collection Time: 03/09/23  4:13 AM  Result Value Ref Range   Magnesium 2.2 1.7 - 2.4 mg/dL    Comment: Performed at [facility], [address] [place], [place] [address]  Phosphorus     Status: None   Collection Time: 03/09/23  4:13 AM  Result Value Ref Range   Phosphorus 3.5 2.5 - 4.6 mg/dL    Comment: Performed at [facility], [address] [place], [place] [address]  Iron and TIBC     Status: Abnormal   Collection Time: 03/09/23  4:13 AM  Result Value Ref Range   Iron 41 (L) 45 - 182 ug/dL   TIBC 643 (L) 329 - 518 ug/dL   Saturation Ratios 17 (L) 17.9 - 39.5 %   UIBC 198 ug/dL    Comment: Performed at [facility], [address] [place], [place] [address]  Folate     Status: None   Collection Time: 03/09/23  4:13 AM   Result Value Ref Range   Folate 11.8 >5.9 ng/mL    Comment: Performed at [facility], [address] [place], [place] [address]  Vitamin B12     Status: Abnormal   Collection Time: 03/10/23  5:40 AM  Result Value Ref Range   Vitamin B-12 955 (H) 180 - 914 pg/mL    Comment: (NOTE) This assay is not validated for testing neonatal or myeloproliferative syndrome specimens for Vitamin B12 levels. Performed at [facility], [address] [place], [place] [address]   VITAMIN D 25 Hydroxy (Vit-D Deficiency, Fractures)     Status: Abnormal   Collection Time: 03/10/23  5:40 AM  Result Value Ref Range   Vit D, 25-Hydroxy 16.45 (L) 30 - 100 ng/mL    Comment: (NOTE) Vitamin D deficiency has been defined by the Institute of Medicine  and an Endocrine Society practice guideline as a level of serum 25-OH  vitamin D less than 20 ng/mL (1,2). The Endocrine Society went on to  further define vitamin D insufficiency as a level between 21 and 29  ng/mL (2).  1. IOM (Institute of Medicine). 2010. Dietary reference intakes for  calcium and D. [place]: [employer]. 2. Holick MF, Binkley Concow, Bischoff-Ferrari HA, et al. Evaluation,  treatment, and prevention of vitamin D deficiency: an Endocrine  Society clinical practice guideline, JCEM. 2011 Jul; 96(7): 1911-30.  Performed at [facility], [address], [place], [place] [address]   Basic metabolic panel     Status: Abnormal   Collection Time: 03/10/23  5:40 AM  Result Value Ref Range   Sodium 130 (L) 135 - 145 mmol/L   Potassium 3.8 3.5 - 5.1 mmol/L   Chloride 103 98 - 111 mmol/L   CO2 23 22 - 32 mmol/L   Glucose, Bld 122 (H) 70 - 99 mg/dL    Comment: Glucose reference range applies only to samples taken after fasting for at least 8 hours.   BUN 16 8 - 23 mg/dL   Creatinine, Ser 9.14 0.61 - 1.24 mg/dL   Calcium 7.8 (L) 8.9 - 10.3 mg/dL   GFR, Estimated >78 >29 mL/min    Comment:  (NOTE) Calculated using the CKD-EPI Creatinine Equation (2021)    Anion gap 4 (L) 5 - 15    Comment: Performed at [facility], [address] [place], [place] [address]  CBC     Status: Abnormal   Collection Time: 03/10/23  5:40 AM  Result Value Ref Range   WBC 11.4 (H) 4.0 - 10.5 K/uL   RBC 3.35 (L) 4.22 - 5.81 MIL/uL   Hemoglobin 10.2 (L) 13.0 - 17.0 g/dL   HCT 08.6 (L) 57.8 - 46.9 %   MCV 95.8 80.0 - 100.0 fL   MCH 30.4 26.0 - 34.0 pg   MCHC 31.8 30.0 - 36.0 g/dL   RDW 62.9 52.8 - 41.3 %   Platelets 270 150 - 400 K/uL   nRBC 0.0 0.0 - 0.2 %    Comment: Performed at [facility], [address] [place], [place] [address]  Hepatic function panel     Status: Abnormal   Collection Time: 03/10/23  5:40 AM  Result Value Ref Range   Total Protein 5.4 (L) 6.5 - 8.1 g/dL   Albumin 2.1 (L) 3.5 - 5.0 g/dL   AST 76 (H) 15 - 41 U/L   ALT 100 (H) 0 - 44 U/L   Alkaline Phosphatase 59 38 - 126 U/L   Total Bilirubin 0.6 0.3 - 1.2 mg/dL   Bilirubin, Direct 0.1 0.0 - 0.2 mg/dL  Indirect Bilirubin 0.5 0.3 - 0.9 mg/dL    Comment: Performed at [facility], [address] [place], [place] [address]  Magnesium     Status: None   Collection Time: 03/10/23  5:40 AM  Result Value Ref Range   Magnesium 2.2 1.7 - 2.4 mg/dL    Comment: Performed at [facility], [address] [place], [place] [address]  Phosphorus     Status: None   Collection Time: 03/10/23  5:40 AM  Result Value Ref Range   Phosphorus 3.2 2.5 - 4.6 mg/dL    Comment: Performed at [facility], [address] [place], [place] [address]  Basic metabolic panel     Status: Abnormal   Collection Time: 03/11/23  5:42 AM  Result Value Ref Range   Sodium 133 (L) 135 - 145 mmol/L   Potassium 4.2 3.5 - 5.1 mmol/L   Chloride 104 98 - 111 mmol/L   CO2 23 22 - 32 mmol/L   Glucose, Bld 126 (H) 70 - 99 mg/dL    Comment: Glucose reference range applies only to samples taken after  fasting for at least 8 hours.   BUN 17 8 - 23 mg/dL   Creatinine, Ser 2.44 0.61 - 1.24 mg/dL   Calcium 8.3 (L) 8.9 - 10.3 mg/dL   GFR, Estimated >01 >02 mL/min    Comment: (NOTE) Calculated using the CKD-EPI Creatinine Equation (2021)    Anion gap 6 5 - 15    Comment: Performed at [facility], [address] [place], [place] [address]  CBC     Status: Abnormal   Collection Time: 03/11/23  5:42 AM  Result Value Ref Range   WBC 12.2 (H) 4.0 - 10.5 K/uL   RBC 3.60 (L) 4.22 - 5.81 MIL/uL   Hemoglobin 11.1 (L) 13.0 - 17.0 g/dL   HCT 64.4 (L) 03.4 - 74.2 %   MCV 94.7 80.0 - 100.0 fL   MCH 30.8 26.0 - 34.0 pg   MCHC 32.6 30.0 - 36.0 g/dL   RDW 59.5 63.8 - 75.6 %   Platelets 263 150 - 400 K/uL   nRBC 0.0 0.0 - 0.2 %    Comment: Performed at [facility], [address] [place], [place] [address]  Hepatic function panel     Status: Abnormal   Collection Time: 03/11/23  5:42 AM  Result Value Ref Range   Total Protein 5.3 (L) 6.5 - 8.1 g/dL   Albumin 2.0 (L) 3.5 - 5.0 g/dL   AST 59 (H) 15 - 41 U/L   ALT 88 (H) 0 - 44 U/L   Alkaline Phosphatase 64 38 - 126 U/L   Total Bilirubin 0.7 0.3 - 1.2 mg/dL   Bilirubin, Direct <5.1 0.0 - 0.2 mg/dL   Indirect Bilirubin NOT CALCULATED 0.3 - 0.9 mg/dL    Comment: Performed at [facility], [address] [place], [place] [address]  Magnesium     Status: None   Collection Time: 03/11/23  5:42 AM  Result Value Ref Range   Magnesium 2.0 1.7 - 2.4 mg/dL    Comment: Performed at [facility], [address] [place], [place] [address]  Phosphorus     Status: None   Collection Time: 03/11/23  5:42 AM  Result Value Ref Range   Phosphorus 3.8 2.5 - 4.6 mg/dL    Comment: Performed at [facility], [address] [place], [place] [address]  ECHOCARDIOGRAM LIMITED  Status: None   Collection Time: 03/11/23 11:19 AM  Result Value Ref Range   Weight 1,724.88 oz   Height 60 in   BP 124/56 mmHg    S' Lateral 3.30 cm   P 1/2 time 343 msec   Est EF 60 - 65%   Basic metabolic panel     Status: Abnormal   Collection Time: 03/12/23  4:26 AM  Result Value Ref Range   Sodium 131 (L) 135 - 145 mmol/L   Potassium 4.2 3.5 - 5.1 mmol/L   Chloride 100 98 - 111 mmol/L   CO2 25 22 - 32 mmol/L   Glucose, Bld 94 70 - 99 mg/dL    Comment: Glucose reference range applies only to samples taken after fasting for at least 8 hours.   BUN 19 8 - 23 mg/dL   Creatinine, Ser 4.09 0.61 - 1.24 mg/dL   Calcium 8.2 (L) 8.9 - 10.3 mg/dL   GFR, Estimated >81 >19 mL/min    Comment: (NOTE) Calculated using the CKD-EPI Creatinine Equation (2021)    Anion gap 6 5 - 15    Comment: Performed at [facility], [address] [place], [place] [address]  CBC     Status: Abnormal   Collection Time: 03/12/23  4:26 AM  Result Value Ref Range   WBC 12.6 (H) 4.0 - 10.5 K/uL   RBC 3.65 (L) 4.22 - 5.81 MIL/uL   Hemoglobin 11.1 (L) 13.0 - 17.0 g/dL   HCT 95.6 (L) 21.3 - 08.6 %   MCV 94.5 80.0 - 100.0 fL   MCH 30.4 26.0 - 34.0 pg   MCHC 32.2 30.0 - 36.0 g/dL   RDW 57.8 46.9 - 62.9 %   Platelets 267 150 - 400 K/uL   nRBC 0.0 0.0 - 0.2 %    Comment: Performed at [facility], [address] [place], [place] [address]  Hepatic function panel     Status: Abnormal   Collection Time: 03/12/23  4:26 AM  Result Value Ref Range   Total Protein 5.4 (L) 6.5 - 8.1 g/dL   Albumin 2.2 (L) 3.5 - 5.0 g/dL   AST 75 (H) 15 - 41 U/L   ALT 106 (H) 0 - 44 U/L   Alkaline Phosphatase 66 38 - 126 U/L   Total Bilirubin 0.5 0.3 - 1.2 mg/dL   Bilirubin, Direct <3.2 0.0 - 0.2 mg/dL   Indirect Bilirubin NOT CALCULATED 0.3 - 0.9 mg/dL    Comment: Performed at [facility], [address] [place], [place] [address]  Magnesium     Status: None   Collection Time: 03/12/23  4:26 AM  Result Value Ref Range   Magnesium 1.9 1.7 - 2.4 mg/dL    Comment: Performed at [facility], [address],  [place], [place] [address]  Phosphorus     Status: None   Collection Time: 03/12/23  4:26 AM  Result Value Ref Range   Phosphorus 4.4 2.5 - 4.6 mg/dL    Comment: Performed at [facility], [address] [place], [place] [address]  Glucose, capillary     Status: None   Collection Time: 03/19/23  1:54 PM  Result Value Ref Range   Glucose-Capillary 85 70 - 99 mg/dL    Comment: Glucose reference range applies only to samples taken after fasting for at least 8 hours.  Rad Jeralene Huff Session Summary     Status: None   Collection Time: 03/20/23  3:16 PM  Result Value Ref Range   Course ID [email or web address]    Course Intent Curative    Course Start Date 03/14/2023 11:04 AM    Session Number 1    Course First Treatment Date 03/20/2023  3:13 PM    Course Last Treatment Date 03/20/2023  3:15 PM    Course Elapsed Days 0    Reference Point ID Whole Brain DP    Reference Point Dosage Given to Date 3 Gy   Reference Point Session Dosage Given 3 Gy   Plan ID Brain_Whole    Plan Fractions Treated to Date 1    Plan Total Fractions Prescribed 10    Plan Prescribed Dose Per Fraction 3 Gy   Plan Total Prescribed Dose 30.000000 Gy   Plan Primary Reference Point Whole Brain DP   Rad Onc Aria Session Summary     Status: None   Collection Time: 03/21/23  2:59 PM  Result Value Ref Range   Course ID [email or web address]    Course Intent Curative    Course Start Date 03/14/2023 11:04 AM    Session Number 2    Course First Treatment Date 03/20/2023  3:13 PM    Course Last Treatment Date 03/21/2023  2:59 PM    Course Elapsed Days 1    Reference Point ID Whole Brain DP    Reference Point Dosage Given to Date 6 Gy   Reference Point Session Dosage Given 3 Gy   Plan ID Brain_Whole    Plan Fractions Treated to Date 2    Plan Total Fractions Prescribed 10    Plan Prescribed Dose Per Fraction 3 Gy   Plan Total Prescribed Dose 30.000000 Gy   Plan Primary Reference Point Whole Brain DP   CBC (Cancer Center Only)     Status:  Abnormal   Collection Time: 03/21/23  3:13 PM  Result Value Ref Range   WBC Count 17.6 (H) 4.0 - 10.5 K/uL   RBC 3.68 (L) 4.22 - 5.81 MIL/uL   Hemoglobin 11.6 (L) 13.0 - 17.0 g/dL   HCT 45.4 (L) 09.8 - 11.9 %   MCV 95.7 80.0 - 100.0 fL   MCH 31.5 26.0 - 34.0 pg   MCHC 33.0 30.0 - 36.0 g/dL   RDW 14.7 82.9 - 56.2 %   Platelet Count 241 150 - 400 K/uL   nRBC 0.0 0.0 - 0.2 %    Comment: Performed at [facility], [address] [place], [place] [address]  Rad Jeralene Huff Session Summary     Status: None   Collection Time: 03/22/23 12:38 PM  Result Value Ref Range   Course ID [email or web address]    Course Intent Curative    Course Start Date 03/14/2023 11:04 AM    Session Number 3    Course First Treatment Date 03/20/2023  3:13 PM    Course Last Treatment Date 03/22/2023 12:37 PM    Course Elapsed Days 2    Reference Point ID Whole Brain DP    Reference Point Dosage Given to Date 9 Gy   Reference Point Session Dosage Given 3 Gy   Plan ID Brain_Whole    Plan Fractions Treated to Date 3    Plan Total Fractions Prescribed 10    Plan Prescribed Dose Per Fraction 3 Gy   Plan Total Prescribed Dose 30.000000 Gy   Plan Primary Reference Point Whole Brain DP       Assessment & Plan:  1) Right foot and ankle xrays - edema, hx of  gout 2) Follow up appt in 2 weeks Problem List Items Addressed This Visit       Cardiovascular and Mediastinum   Congestive heart failure (HCC)     Respiratory   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   Total time spent: 35 minutes  Orson Eva, NP  03/26/2023   This document may have been prepared by [facility] Voice Recognition software and as such may include  unintentional dictation errors.

## 2023-03-27 ENCOUNTER — Other Ambulatory Visit: Payer: Self-pay

## 2023-03-27 ENCOUNTER — Encounter: Payer: Self-pay | Admitting: Oncology

## 2023-03-27 ENCOUNTER — Ambulatory Visit
Admission: RE | Admit: 2023-03-27 | Discharge: 2023-03-27 | Disposition: A | Discharge status: Home / Self Care | Payer: Medicare Other | Source: Ambulatory Visit | Attending: Radiation Oncology | Admitting: Radiation Oncology

## 2023-03-27 DIAGNOSIS — Z510 Encounter for antineoplastic radiation therapy: Secondary | ICD-10-CM | POA: Diagnosis not present

## 2023-03-27 LAB — RAD ONC ARIA SESSION SUMMARY
Course Elapsed Days: 7
Plan Fractions Treated to Date: 5
Plan Prescribed Dose Per Fraction: 3 Gy
Plan Total Fractions Prescribed: 10
Plan Total Prescribed Dose: 30 Gy
Reference Point Dosage Given to Date: 15 Gy
Reference Point Session Dosage Given: 3 Gy
Session Number: 5

## 2023-03-28 ENCOUNTER — Ambulatory Visit: Payer: Medicare Other

## 2023-03-28 ENCOUNTER — Ambulatory Visit (INDEPENDENT_AMBULATORY_CARE_PROVIDER_SITE_OTHER): Payer: Medicare Other

## 2023-03-28 ENCOUNTER — Inpatient Hospital Stay: Payer: Medicare Other

## 2023-03-28 ENCOUNTER — Ambulatory Visit
Admission: RE | Admit: 2023-03-28 | Discharge: 2023-03-28 | Disposition: A | Discharge status: Home / Self Care | Payer: Medicare Other | Source: Ambulatory Visit | Attending: Radiation Oncology | Admitting: Radiation Oncology

## 2023-03-28 ENCOUNTER — Other Ambulatory Visit: Payer: Self-pay

## 2023-03-28 DIAGNOSIS — Z510 Encounter for antineoplastic radiation therapy: Secondary | ICD-10-CM | POA: Diagnosis not present

## 2023-03-28 DIAGNOSIS — R600 Localized edema: Secondary | ICD-10-CM | POA: Diagnosis not present

## 2023-03-28 LAB — RAD ONC ARIA SESSION SUMMARY
Course Elapsed Days: 8
Plan Fractions Treated to Date: 6
Plan Prescribed Dose Per Fraction: 3 Gy
Plan Total Fractions Prescribed: 10
Plan Total Prescribed Dose: 30 Gy
Reference Point Dosage Given to Date: 18 Gy
Reference Point Session Dosage Given: 3 Gy
Session Number: 6

## 2023-03-28 NOTE — Progress Notes (Signed)
Patient notified

## 2023-03-28 NOTE — Progress Notes (Signed)
Please let Patient's daughter Cody [name] know that he has nonspecific swelling in both his foot and ankle so keep it elevated all throughout the day for the next 7 days

## 2023-03-29 ENCOUNTER — Other Ambulatory Visit: Payer: Self-pay

## 2023-03-29 ENCOUNTER — Ambulatory Visit
Admission: RE | Admit: 2023-03-29 | Discharge: 2023-03-29 | Disposition: A | Discharge status: Home / Self Care | Payer: Medicare Other | Source: Ambulatory Visit | Attending: Radiation Oncology | Admitting: Radiation Oncology

## 2023-03-29 DIAGNOSIS — Z510 Encounter for antineoplastic radiation therapy: Secondary | ICD-10-CM | POA: Diagnosis not present

## 2023-03-29 LAB — RAD ONC ARIA SESSION SUMMARY
Course Elapsed Days: 9
Plan Fractions Treated to Date: 7
Plan Prescribed Dose Per Fraction: 3 Gy
Plan Total Fractions Prescribed: 10
Plan Total Prescribed Dose: 30 Gy
Reference Point Dosage Given to Date: 21 Gy
Reference Point Session Dosage Given: 3 Gy
Session Number: 7

## 2023-04-01 ENCOUNTER — Ambulatory Visit
Admission: RE | Admit: 2023-04-01 | Discharge: 2023-04-01 | Disposition: A | Discharge status: Home / Self Care | Payer: Medicare Other | Source: Ambulatory Visit | Attending: Radiation Oncology | Admitting: Radiation Oncology

## 2023-04-01 ENCOUNTER — Other Ambulatory Visit: Payer: Self-pay

## 2023-04-01 ENCOUNTER — Ambulatory Visit: Payer: Medicare Other | Admitting: Nurse Practitioner

## 2023-04-01 DIAGNOSIS — C7931 Secondary malignant neoplasm of brain: Secondary | ICD-10-CM | POA: Insufficient documentation

## 2023-04-01 DIAGNOSIS — Z87891 Personal history of nicotine dependence: Secondary | ICD-10-CM | POA: Diagnosis not present

## 2023-04-01 DIAGNOSIS — Z510 Encounter for antineoplastic radiation therapy: Secondary | ICD-10-CM | POA: Diagnosis not present

## 2023-04-01 DIAGNOSIS — E871 Hypo-osmolality and hyponatremia: Secondary | ICD-10-CM | POA: Diagnosis not present

## 2023-04-01 DIAGNOSIS — C3490 Malignant neoplasm of unspecified part of unspecified bronchus or lung: Secondary | ICD-10-CM | POA: Diagnosis not present

## 2023-04-01 DIAGNOSIS — I3131 Malignant pericardial effusion in diseases classified elsewhere: Secondary | ICD-10-CM | POA: Insufficient documentation

## 2023-04-01 LAB — RAD ONC ARIA SESSION SUMMARY
Course Elapsed Days: 12
Plan Fractions Treated to Date: 8
Plan Prescribed Dose Per Fraction: 3 Gy
Plan Total Fractions Prescribed: 10
Plan Total Prescribed Dose: 30 Gy
Reference Point Dosage Given to Date: 24 Gy
Reference Point Session Dosage Given: 3 Gy
Session Number: 8

## 2023-04-02 ENCOUNTER — Encounter: Payer: Self-pay | Admitting: Hospice and Palliative Medicine

## 2023-04-02 ENCOUNTER — Other Ambulatory Visit (HOSPITAL_COMMUNITY): Payer: Self-pay

## 2023-04-02 ENCOUNTER — Inpatient Hospital Stay: Payer: Medicare Other

## 2023-04-02 ENCOUNTER — Other Ambulatory Visit: Payer: Self-pay

## 2023-04-02 ENCOUNTER — Telehealth: Payer: Self-pay | Admitting: Pharmacist

## 2023-04-02 ENCOUNTER — Ambulatory Visit
Admission: RE | Admit: 2023-04-02 | Discharge: 2023-04-02 | Disposition: A | Discharge status: Home / Self Care | Payer: Medicare Other | Source: Ambulatory Visit | Attending: Radiation Oncology | Admitting: Radiation Oncology

## 2023-04-02 ENCOUNTER — Inpatient Hospital Stay: Payer: Medicare Other | Attending: Hospice and Palliative Medicine | Admitting: Hospice and Palliative Medicine

## 2023-04-02 ENCOUNTER — Encounter: Payer: Self-pay | Admitting: *Deleted

## 2023-04-02 ENCOUNTER — Inpatient Hospital Stay (HOSPITAL_BASED_OUTPATIENT_CLINIC_OR_DEPARTMENT_OTHER): Payer: Medicare Other | Admitting: Oncology

## 2023-04-02 VITALS — BP 144/74 | HR 97 | Temp 96.6°F | Resp 20 | Wt 95.5 lb

## 2023-04-02 DIAGNOSIS — I3139 Other pericardial effusion (noninflammatory): Secondary | ICD-10-CM

## 2023-04-02 DIAGNOSIS — G9389 Other specified disorders of brain: Secondary | ICD-10-CM | POA: Diagnosis not present

## 2023-04-02 DIAGNOSIS — E871 Hypo-osmolality and hyponatremia: Secondary | ICD-10-CM | POA: Diagnosis not present

## 2023-04-02 DIAGNOSIS — Z515 Encounter for palliative care: Secondary | ICD-10-CM | POA: Diagnosis not present

## 2023-04-02 DIAGNOSIS — Z510 Encounter for antineoplastic radiation therapy: Secondary | ICD-10-CM | POA: Diagnosis not present

## 2023-04-02 DIAGNOSIS — C3490 Malignant neoplasm of unspecified part of unspecified bronchus or lung: Secondary | ICD-10-CM | POA: Diagnosis not present

## 2023-04-02 DIAGNOSIS — G939 Disorder of brain, unspecified: Secondary | ICD-10-CM | POA: Insufficient documentation

## 2023-04-02 DIAGNOSIS — J90 Pleural effusion, not elsewhere classified: Secondary | ICD-10-CM | POA: Insufficient documentation

## 2023-04-02 DIAGNOSIS — R748 Abnormal levels of other serum enzymes: Secondary | ICD-10-CM | POA: Insufficient documentation

## 2023-04-02 DIAGNOSIS — Z87891 Personal history of nicotine dependence: Secondary | ICD-10-CM | POA: Insufficient documentation

## 2023-04-02 DIAGNOSIS — C7931 Secondary malignant neoplasm of brain: Secondary | ICD-10-CM | POA: Insufficient documentation

## 2023-04-02 DIAGNOSIS — I3131 Malignant pericardial effusion in diseases classified elsewhere: Secondary | ICD-10-CM | POA: Insufficient documentation

## 2023-04-02 DIAGNOSIS — R7989 Other specified abnormal findings of blood chemistry: Secondary | ICD-10-CM

## 2023-04-02 DIAGNOSIS — C778 Secondary and unspecified malignant neoplasm of lymph nodes of multiple regions: Secondary | ICD-10-CM | POA: Insufficient documentation

## 2023-04-02 DIAGNOSIS — F1090 Alcohol use, unspecified, uncomplicated: Secondary | ICD-10-CM | POA: Insufficient documentation

## 2023-04-02 LAB — CBC WITH DIFFERENTIAL (CANCER CENTER ONLY)
Abs Immature Granulocytes: 0.48 10*3/uL — ABNORMAL HIGH (ref 0.00–0.07)
Basophils Absolute: 0.1 10*3/uL (ref 0.0–0.1)
Basophils Relative: 0 %
Eosinophils Absolute: 0.2 10*3/uL (ref 0.0–0.5)
Eosinophils Relative: 2 %
HCT: 38.7 % — ABNORMAL LOW (ref 39.0–52.0)
Hemoglobin: 12.3 g/dL — ABNORMAL LOW (ref 13.0–17.0)
Immature Granulocytes: 4 %
Lymphocytes Relative: 11 %
Lymphs Abs: 1.3 10*3/uL (ref 0.7–4.0)
MCH: 30.6 pg (ref 26.0–34.0)
MCHC: 31.8 g/dL (ref 30.0–36.0)
MCV: 96.3 fL (ref 80.0–100.0)
Monocytes Absolute: 1 10*3/uL (ref 0.1–1.0)
Monocytes Relative: 9 %
Neutro Abs: 8.8 10*3/uL — ABNORMAL HIGH (ref 1.7–7.7)
Neutrophils Relative %: 74 %
Platelet Count: 261 10*3/uL (ref 150–400)
RBC: 4.02 MIL/uL — ABNORMAL LOW (ref 4.22–5.81)
RDW: 16.2 % — ABNORMAL HIGH (ref 11.5–15.5)
WBC Count: 11.8 10*3/uL — ABNORMAL HIGH (ref 4.0–10.5)
nRBC: 0 % (ref 0.0–0.2)

## 2023-04-02 LAB — RAD ONC ARIA SESSION SUMMARY
Course Elapsed Days: 13
Plan Fractions Treated to Date: 9
Plan Prescribed Dose Per Fraction: 3 Gy
Plan Total Fractions Prescribed: 10
Plan Total Prescribed Dose: 30 Gy
Reference Point Dosage Given to Date: 27 Gy
Reference Point Session Dosage Given: 3 Gy
Session Number: 9

## 2023-04-02 LAB — CMP (CANCER CENTER ONLY)
ALT: 67 U/L — ABNORMAL HIGH (ref 0–44)
AST: 35 U/L (ref 15–41)
Albumin: 3.1 g/dL — ABNORMAL LOW (ref 3.5–5.0)
Alkaline Phosphatase: 88 U/L (ref 38–126)
Anion gap: 6 (ref 5–15)
BUN: 28 mg/dL — ABNORMAL HIGH (ref 8–23)
CO2: 29 mmol/L (ref 22–32)
Calcium: 8.9 mg/dL (ref 8.9–10.3)
Chloride: 96 mmol/L — ABNORMAL LOW (ref 98–111)
Creatinine: 0.81 mg/dL (ref 0.61–1.24)
GFR, Estimated: >60 mL/min (ref >60)
Glucose, Bld: 86 mg/dL (ref 70–99)
Potassium: 4.7 mmol/L (ref 3.5–5.1)
Sodium: 131 mmol/L — ABNORMAL LOW (ref 135–145)
Total Bilirubin: 0.6 mg/dL (ref 0.3–1.2)
Total Protein: 7.3 g/dL (ref 6.5–8.1)

## 2023-04-02 NOTE — Assessment & Plan Note (Signed)
S/p pericardiocentesis. Clinically stable.

## 2023-04-02 NOTE — Progress Notes (Signed)
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  Past medical history of etoh and tobacco use, CHF, HTN.  Currently receiving whole brain radiation.    Met with patient and daughter, Irving Burton.  Daughter provided interpretation during visit. Daughter reports that patient is eating well as on steroid medication.  "He is eating 7-8 times per day.  Likes rice, fish, chicken, pork, vegetables, beans, eggs.  Drinks ensure 2 times a day.  Says that sometimes his stomach hurts.   Prior to starting steroids decreased intake.    Medications: MVI, miralax, Vit D, folic acid, Vit B1, diflucan   Labs: reviewed   Anthropometrics:   Height: 4 ft 10 inches Weight: 95 lb today UBW: 101 lb 07/17/22 BMI: 20 6% weight loss in the last 8 months   Estimated Energy Needs  Kcals: 1290-1500 Protein: 64-75 g Fluid: 1290-1500 ml   NUTRITION DIAGNOSIS: Inadequate oral intake related to cancer as evidenced by 6% weight loss in the last 8 months.  Intake improving as on steroids but starting to wean    INTERVENTION:  Encouraged good sources of protein at each meal.  Examples of foods containing protein discussed Encouraged 350 calorie shake or higher at least 2-3 times per day Contact information provided   MONITORING, EVALUATION, GOAL: weight trends, intake   Next Visit: to be determined  Cyanna Neace B. Freida Busman, RD, LDN Registered Dietitian [phone]

## 2023-04-02 NOTE — Progress Notes (Signed)
[facility]iative [facility] at [facility] [phone] [phone]   Name: Anita Bocook Date: 04/02/2023 MRN: [id number]  DOB: [birth year]  Patient Care Team: Orson Eva, NP as PCP - General (Nurse Practitioner) Glory Buff, RN as Oncology Nurse Navigator    REASON FOR CONSULTATION: Kazimer Gresser is a 77 y.o. 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  Patient was referred to palliative care to address goals and manage ongoing symptoms.  SOCIAL HISTORY:     reports that he quit smoking about 3 weeks ago. His smoking use included cigarettes. He has a 25.00 pack-year smoking history. He has been exposed to tobacco smoke. He has never used smokeless tobacco. He reports that he does not currently use alcohol. He reports that he does not use drugs.  Patient is married lives at home with his wife and daughter.  Patient has 11 children with ages ranging from 69s to 43.  Patient worked in Scient[employer], [employer].  Patient is from [place] originally.  ADVANCE DIRECTIVES:    CODE STATUS:   PAST MEDICAL HISTORY: Past Medical History:  Diagnosis Date   Alcohol dependence (HCC)    CHF (congestive heart failure) (HCC)    Hypertension    Tobacco abuse     PAST SURGICAL HISTORY:  Past Surgical History:  Procedure Laterality Date   APPENDECTOMY     LEFT HEART CATH AND CORONARY ANGIOGRAPHY N/A 08/25/2018   Procedure: LEFT HEART CATH AND CORONARY ANGIOGRAPHY;  Surgeon: Laurier Nancy, MD;  Location: [facility] INVASIVE CV LAB;  Service: Cardiovascular;  Laterality: N/A;   PERICARDIOCENTESIS N/A 03/06/2023   Procedure: PERICARDIOCENTESIS;  Surgeon: Marcina Millard, MD;  Location: [facility] INVASIVE CV LAB;  Service: Cardiovascular;  Laterality: N/A;    HEMATOLOGY/ONCOLOGY HISTORY:  Oncology History   Metastatic non-small cell lung cancer (HCC)  03/05/2023 Imaging   CT chest abdomen pelvis with contrast showed large pericardial effusion. Moderate right and small left effusion with some loculated components on the left. Adjacent parenchymal opacities. Atelectasis versus infiltrates. Few borderline enlarged lymph nodes including mediastinum, upper retroperitoneum. Enlarged prostate with mass effect along the bladder with bladder wall thickening. No bowel obstruction or free air. Small area of wall thickening along the colon.    03/05/2023 Imaging   CT head showed abdominal vasogenic edema in the right posterior frontal region and in the left occipital lobe worrisome for presence of occult mass lesions. Questionable mild edema and mass effect in the inferior right cerebellum. No visible hemorrhage. 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 Atherosclerotic calcification of the major vessels at the base of brain.    03/05/2023 Imaging   MRI brain with and without contrast showed multiple contrast-enhancing lesions in the right frontal lobe, left occipital lobe, superior right cerebellum with surrounding vasogenic edema. Worrisome for intracranial metastatic disease. Small dural based contrast-enhancing lesions along the left frontal convexity could represent a small hemangioma. Metastatic disease is not excluded.    03/05/2023 - 03/12/2023 Hospital Admission   Patient presented emergency room due to confusion, shortness of breath. CT head and MRI showed multiple contrast-enhancing lesions with vasogenic edema CT chest abdomen pelvis showed large pericardial effusion, pleural effusion, mediastinal/upper retroperitoneum lymphadenopathy Patient was treated with Decadron, neurosurgery recommended no intervention. Status post pericardiocentesis on 03/07/2023.  Pigtail catheter was initially placed and later on removed.  TTE showed no pericardial effusion. Bilateral  pleural effusion, status post thoracentesis.   Cytology positive. 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  Patient was treated with 3% saline clinic sodium improved. Gout with uric acid of 9.7.  Started on colchicine. Alcohol use disorder, status post CIWA protocol with Ativan as needed.   03/07/2023 Cancer Staging   Staging form: Lung, AJCC 8th Edition - Clinical stage from 03/07/2023: Stage IV (cTX, cN2, cM1) - Signed by Rickard Patience, MD on 03/13/2023 Stage prefix: Initial diagnosis   03/07/2023 Echocardiogram   1. Left ventricular ejection fraction, by estimation, is 60 to 65%. The left ventricle has normal function. The left ventricle has no regional  wall motion abnormalities. Indeterminate diastolic filling due to E-A fusion.   2. Right ventricular systolic function is normal. The right ventricular size is normal.   3. Moderate pericardial effusion.   4. The mitral valve is normal in structure. Mild mitral valve regurgitation. No evidence of mitral stenosis.   5. The aortic valve is normal in structure. Aortic valve regurgitation is mild. No aortic stenosis is present.   6. The inferior vena cava is normal in size with greater than 50% respiratory variability, suggesting right atrial pressure of 3 mmHg.     03/07/2023 Procedure   -5/9 S/p pericardiocentesis with removal of 1.1 L of bloody fluid.  Pigtail catheter left in place.  -Cytology came back positive for malignancy.  Metastatic adenocarcinoma, compatible with pulmonary adenocarcinoma   -5/9 s/p  thoracentesis, cytology is negative.    03/08/2023 Initial Diagnosis   Metastatic non-small cell lung cancer (HCC)   03/11/2023 Echocardiogram   1. Left ventricular ejection fraction, by estimation, is 60 to 65%. The left ventricle has normal function.   2. Mild to moderate mitral valve regurgitation.   3. Aortic valve regurgitation is mild to moderate      ALLERGIES:  is allergic to shellfish allergy.  MEDICATIONS:  Current Outpatient Medications   Medication Sig Dispense Refill   allopurinol (ZYLOPRIM) 100 MG tablet Take 1 tablet (100 mg total) by mouth daily. 30 tablet 11   amiodarone (PACERONE) 200 MG tablet Take 1 tablet (200 mg total) by mouth daily. 30 tablet 5   colchicine 0.6 MG tablet Take 1 tablet (0.6 mg total) by mouth daily. 30 tablet 2   dexamethasone (DECADRON) 2 MG tablet Take 1 tablet (2 mg total) by mouth every 12 (twelve) hours. 60 tablet 5   finasteride (PROSCAR) 5 MG tablet Take 1 tablet (5 mg total) by mouth daily. 90 tablet 3   fluconazole (DIFLUCAN) 100 MG tablet Take 1 tablet (100 mg total) by mouth daily. 7 tablet 0   folic acid (FOLVITE) 1 MG tablet Take 1 tablet (1 mg total) by mouth daily. 30 tablet 2   iron polysaccharides (NIFEREX) 150 MG capsule Take 1 capsule (150 mg total) by mouth daily. 30 capsule 2   levETIRAcetam (KEPPRA) 500 MG tablet Take 1 tablet (500 mg total) by mouth 2 (two) times daily. 60 tablet 5   Multiple Vitamin (MULTIVITAMIN WITH MINERALS) TABS tablet Take 1 tablet by mouth daily. 30 tablet 2   polyethylene glycol (MIRALAX / GLYCOLAX) 17 g packet Take 17 g by mouth daily as needed for mild constipation or moderate constipation. 14 each 0   tamsulosin (FLOMAX) 0.4 MG CAPS capsule Take 1 capsule (0.4 mg total) by mouth daily. 90 capsule 3   thiamine (VITAMIN B-1) 100 MG tablet Take 1 tablet (100 mg total) by mouth daily. 30 tablet 2  Vitamin D, Ergocalciferol, (DRISDOL) 1.25 MG (50000 UNIT) CAPS capsule Take 1 capsule (50,000 Units total) by mouth every 7 (seven) days. 12 capsule 0   No current facility-administered medications for this visit.    VITAL SIGNS: Wt 95 lb 8 oz (43.3 kg)   BMI 19.96 kg/m  Filed Weights   04/02/23 1410  Weight: 95 lb 8 oz (43.3 kg)    Estimated body mass index is 19.96 kg/m as calculated from the following:   Height as of 03/26/23: 4\' 10"  (1.473 m).   Weight as of this encounter: 95 lb 8 oz (43.3 kg).  LABS: CBC:    Component Value Date/Time   WBC  17.6 (H) 03/21/2023 1513   WBC 12.6 (H) 03/12/2023 0426   HGB 11.6 (L) 03/21/2023 1513   HGB 14.6 12/19/2022 0841   HCT 35.2 (L) 03/21/2023 1513   HCT 43.8 12/19/2022 0841   PLT 241 03/21/2023 1513   PLT 292 12/19/2022 0841   MCV 95.7 03/21/2023 1513   MCV 99 (H) 12/19/2022 0841   NEUTROABS 9.2 (H) 03/06/2023 0258   NEUTROABS 3.9 12/19/2022 0841   LYMPHSABS 1.2 03/06/2023 0258   LYMPHSABS 1.7 12/19/2022 0841   MONOABS 0.6 03/06/2023 0258   EOSABS 0.1 03/06/2023 0258   EOSABS 1.3 (H) 12/19/2022 0841   BASOSABS 0.0 03/06/2023 0258   BASOSABS 0.0 12/19/2022 0841   Comprehensive Metabolic Panel:    Component Value Date/Time   NA 131 (L) 03/12/2023 0426   NA 140 12/19/2022 0841   K 4.2 03/12/2023 0426   CL 100 03/12/2023 0426   CO2 25 03/12/2023 0426   BUN 19 03/12/2023 0426   BUN 11 12/19/2022 0841   CREATININE 0.68 03/12/2023 0426   GLUCOSE 94 03/12/2023 0426   CALCIUM 8.2 (L) 03/12/2023 0426   AST 75 (H) 03/12/2023 0426   ALT 106 (H) 03/12/2023 0426   ALKPHOS 66 03/12/2023 0426   BILITOT 0.5 03/12/2023 0426   BILITOT 0.4 12/19/2022 0841   PROT 5.4 (L) 03/12/2023 0426   PROT 6.7 12/19/2022 0841   ALBUMIN 2.2 (L) 03/12/2023 0426   ALBUMIN 3.9 12/19/2022 0841    RADIOGRAPHIC STUDIES: NM PET Image Initial (PI) Skull Base To Thigh  Result Date: 03/23/2023 CLINICAL DATA:  Initial treatment strategy for metastatic non-small cell lung cancer. EXAM: NUCLEAR MEDICINE PET SKULL BASE TO THIGH TECHNIQUE: 5.4 mCi F-18 FDG was injected intravenously. Full-ring PET imaging was performed from the skull base to thigh after the radiotracer. CT data was obtained and used for attenuation correction and anatomic localization. Fasting blood glucose: 85 mg/dl COMPARISON:  CT chest abdomen pelvis dated 03/05/2023 FINDINGS: Mediastinal blood pool activity: SUV max 1.8 Liver activity: SUV max NA NECK: No hypermetabolic cervical lymphadenopathy. Incidental CT findings: None. CHEST: 13 mm irregular  left lower lobe nodule (series 3/image 87), max SUV 3.2, suspicious. Thoracic lymphadenopathy, including: --5 mm short axis right supraclavicular node (series 3/image 38), max SUV 5.8 --8 mm short axis high right paratracheal node (series 3/image 46), max SUV 8.3 --12 mm short axis subcarinal node (series 3/image 86), max SUV 9.9 --Left perihilar hypermetabolism, max SUV 5.2 Incidental CT findings: Prior pericardial effusion has resolved. Atherosclerotic calcifications of the arch. Moderate three-vessel coronary atherosclerosis. Patchy left lower lobe atelectasis. Prior pleural effusions have resolved. ABDOMEN/PELVIS: No abnormal hypermetabolism in the liver, spleen, pancreas, or adrenal glands. Irregular appearance of the posterior gastric antrum (series 3/image 83), non masslike but with associated mild hypermetabolism in this region (max SUV 5.7). Upper  abdominal and retroperitoneal lymphadenopathy, including: --10 mm short axis peripancreatic node (series 3/image 81), max SUV 7.1 --12 mm short axis celiac axis node (series 3/image 87), max SUV 7.1 --12 mm short axis right para-aortic node (series 3/image 90), max SUV 6.8 --12 mm short axis left para-aortic node (series 3/image 92), max SUV 7.0 Incidental CT findings: Atherosclerotic calcifications the abdominal aorta and branch vessels. Moderate colonic stool burden, suggesting mild constipation. Prostatomegaly, suggesting BPH, with thick-walled bladder, suggesting chronic bladder outlet obstruction. SKELETON: No focal hypermetabolic activity to suggest skeletal metastasis. Incidental CT findings: Degenerative changes of the visualized thoracolumbar spine. IMPRESSION: 13 mm irregular left lower lobe nodule, likely corresponding to the patient's known primary bronchogenic carcinoma. Thoracic and upper abdominal nodal metastases, as described above. Irregular appearance of the posterior gastric antrum, with associated hypermetabolism, as above. Appearance favors  gastritis versus gastric ulcer. Consider upper endoscopy for further evaluation. Prior pericardial effusion and bilateral pleural effusions have resolved. Electronically Signed   By: Charline Bills M.D.   On: 03/23/2023 01:35   ECHOCARDIOGRAM LIMITED  Result Date: 03/11/2023    ECHOCARDIOGRAM LIMITED REPORT   Patient Name:   Robinson Reinheimer    Date of Exam: 03/11/2023 Medical Rec #:  [id number]  Height:       60.0 in Accession #:    [id number] Weight:       107.8 lb Date of Birth:  [birth year] BSA:          1.435 m Patient Age:    76 years   BP:           124/56 mmHg Patient Gender: M          HR:           77 bpm. Exam Location:  [facility] Procedure: Limited Echo, Cardiac Doppler and Color Doppler Indications:     Pericardial Effusion I31.3  History:         Patient has prior history of Echocardiogram examinations, most                  recent 03/07/2023. CHF; Risk Factors:Hypertension. Alcohol                  dependence, tobacco abuse.  Sonographer:     Cristela Blue Referring Phys:  [id number] Lyn Hollingshead PARASCHOS Diagnosing Phys: Marcina Millard MD IMPRESSIONS  1. Left ventricular ejection fraction, by estimation, is 60 to 65%. The left ventricle has normal function.  2. Mild to moderate mitral valve regurgitation.  3. Aortic valve regurgitation is mild to moderate. FINDINGS  Left Ventricle: Left ventricular ejection fraction, by estimation, is 60 to 65%. The left ventricle has normal function. Pericardium: There is no evidence of pericardial effusion. Mitral Valve: Mild to moderate mitral valve regurgitation. Tricuspid Valve: Tricuspid valve regurgitation is mild. Aortic Valve: Aortic valve regurgitation is mild to moderate. Aortic regurgitation PHT measures 343 msec. Additional Comments: Spectral Doppler performed. Color Doppler performed.  LEFT VENTRICLE PLAX 2D LVIDd:         5.10 cm   Diastology LVIDs:         3.30 cm   LV e' medial: 5.55 cm/s LV PW:         0.80 cm LV IVS:        0.80 cm LVOT diam:     2.10 cm LVOT  Area:     3.46 cm  RIGHT VENTRICLE RV Basal diam:  2.80 cm RV Mid diam:    2.60 cm TAPSE (M-mode): 1.2  cm LEFT ATRIUM             Index        RIGHT ATRIUM           Index LA diam:        3.10 cm 2.16 cm/m   RA Area:     15.90 cm LA Vol (A2C):   15.7 ml 10.94 ml/m  RA Volume:   40.70 ml  28.35 ml/m LA Vol (A4C):   18.9 ml 13.17 ml/m LA Biplane Vol: 17.5 ml 12.19 ml/m  AORTIC VALVE AI PHT:      343 msec  AORTA Ao Root diam: 3.00 cm TRICUSPID VALVE TR Peak grad:   23.4 mmHg TR Vmax:        242.00 cm/s  SHUNTS Systemic Diam: 2.10 cm Marcina Millard MD Electronically signed by Marcina Millard MD Signature Date/Time: 03/11/2023/12:51:41 PM    Final    EEG adult  Result Date: 03/07/2023 Jefferson Fuel, MD     03/08/2023  3:10 PM Routine EEG Report Kelsie Reza is a 77 y.o. male with a history of altered mental status who is undergoing an EEG to evaluate for seizures. Report: This EEG was acquired with electrodes placed according to the International 10-20 electrode system (including Fp1, Fp2, F3, F4, C3, C4, P3, P4, O1, O2, T3, T4, T5, T6, A1, A2, Fz, Cz, Pz). The following electrodes were missing or displaced: none. The occipital dominant rhythm was 4-6 Hz. This activity is reactive to stimulation. There are runs of faster activity most prominent over the left frontal region that are favored to be arousals. Drowsiness was manifested by background fragmentation; deeper stages of sleep were identified by K complexes and sleep spindles. There iws focal slowing over the left frontal region. There were occasional L occipital sharp waves. There were no electrographic seizures identified. Photic stimulation and hyperventilation were not performed. Impression and clinical correlation: This EEG was obtained while awake and asleep and is abnormal due to: - moderate diffuse slowing indicative of global cerebral dysfunction - left focal slowing indicative of superimposed focal cerebral dysfunction in that region - sharp  waves in the left occipital region indicating increased epileptogenicity in that region No electrographic seizures were captured during this recording. Bing Neighbors, MD [facility] [phone] If 7pm- 7am, please page neurology on call as listed in AMION.   DG Chest Port 1 View  Result Date: 03/07/2023 CLINICAL DATA:  Status post right thoracentesis EXAM: PORTABLE CHEST 1 VIEW COMPARISON:  Chest radiograph dated 03/07/2023 FINDINGS: Lines/tubes: Partially imaged inferior approach catheter projects over the left upper quadrant. Chest: Improved lung aeration with persistent left-greater-than-right interstitial and patchy opacities. Pleura: Decreased right pleural effusion. Persistent moderate left pleural effusion. No pneumothorax. Heart/mediastinum: Similar enlarged cardiomediastinal silhouette. Bones: No acute osseous abnormality. IMPRESSION: 1. Decreased right pleural effusion status post thoracentesis. No pneumothorax. 2. Persistent moderate left pleural effusion. 3. Improved lung aeration with persistent left-greater-than-right interstitial and patchy opacities. Electronically Signed   By: Agustin Cree M.D.   On: 03/07/2023 16:35   ECHOCARDIOGRAM LIMITED  Result Date: 03/07/2023    ECHOCARDIOGRAM LIMITED REPORT   Patient Name:   Remiel Heinzman    Date of Exam: 03/07/2023 Medical Rec #:  [id number]  Height:       60.0 in Accession #:    [id number] Weight:       107.8 lb Date of Birth:  [birth year] BSA:          1.435 m Patient Age:  76 years   BP:           92/60 mmHg Patient Gender: M          HR:           87 bpm. Exam Location:  [facility] Procedure: Limited Echo, Color Doppler and Cardiac Doppler Indications:     Tamponade I31.4  History:         Patient has prior history of Echocardiogram examinations, most                  recent 03/06/2023. CHF; Risk Factors:Hypertension. Tobacco abuse.  Sonographer:     Cristela Blue Referring Phys:  [id number] CARALYN HUDSON Diagnosing Phys: Marcina Millard MD IMPRESSIONS   1. Left ventricular ejection fraction, by estimation, is 60 to 65%. The left ventricle has normal function. The left ventricle has no regional wall motion abnormalities. Indeterminate diastolic filling due to E-A fusion.  2. Right ventricular systolic function is normal. The right ventricular size is normal.  3. Moderate pericardial effusion.  4. The mitral valve is normal in structure. Mild mitral valve regurgitation. No evidence of mitral stenosis.  5. The aortic valve is normal in structure. Aortic valve regurgitation is mild. No aortic stenosis is present.  6. The inferior vena cava is normal in size with greater than 50% respiratory variability, suggesting right atrial pressure of 3 mmHg. FINDINGS  Left Ventricle: Left ventricular ejection fraction, by estimation, is 60 to 65%. The left ventricle has normal function. The left ventricle has no regional wall motion abnormalities. The left ventricular internal cavity size was normal in size. There is  no left ventricular hypertrophy. Indeterminate diastolic filling due to E-A fusion. Right Ventricle: The right ventricular size is normal. No increase in right ventricular wall thickness. Right ventricular systolic function is normal. Left Atrium: Left atrial size was normal in size. Right Atrium: Right atrial size was normal in size. Pericardium: Pericardial effusion much reduced after pericardiocenetesis. No right atrial or right ventricular diastolic collapse. No evidence for preicardial tamonade. A moderately sized pericardial effusion is present. Mitral Valve: The mitral valve is normal in structure. Mild mitral valve regurgitation. No evidence of mitral valve stenosis. Tricuspid Valve: The tricuspid valve is normal in structure. Tricuspid valve regurgitation is mild . No evidence of tricuspid stenosis. Aortic Valve: The aortic valve is normal in structure. Aortic valve regurgitation is mild. Aortic regurgitation PHT measures 436 msec. No aortic stenosis is  present. Pulmonic Valve: The pulmonic valve was normal in structure. Pulmonic valve regurgitation is not visualized. No evidence of pulmonic stenosis. Aorta: The aortic root is normal in size and structure. Venous: The inferior vena cava is normal in size with greater than 50% respiratory variability, suggesting right atrial pressure of 3 mmHg. IAS/Shunts: No atrial level shunt detected by color flow Doppler. Additional Comments: Spectral Doppler performed. Color Doppler performed.  LEFT VENTRICLE PLAX 2D LVIDd:         4.40 cm LVIDs:         2.80 cm LV PW:         0.90 cm LV IVS:        0.80 cm LVOT diam:     2.00 cm LVOT Area:     3.14 cm  LEFT ATRIUM         Index LA diam:    2.80 cm 1.95 cm/m  AORTIC VALVE AI PHT:      436 msec  AORTA Ao Root diam: 3.00 cm MITRAL VALVE  TRICUSPID VALVE MV Area (PHT): 4.99 cm     TR Peak grad:   24.0 mmHg MV Decel Time: 152 msec     TR Vmax:        245.00 cm/s MV E velocity: 126.00 cm/s                             SHUNTS                             Systemic Diam: 2.00 cm Marcina Millard MD Electronically signed by Marcina Millard MD Signature Date/Time: 03/07/2023/1:09:22 PM    Final    CT HEAD WO CONTRAST ( )  Result Date: 03/07/2023 CLINICAL DATA:  Neuro deficit, acute, stroke suspected. Altered mental status. Memory loss. EXAM: CT HEAD WITHOUT CONTRAST TECHNIQUE: Contiguous axial images were obtained from the base of the skull through the vertex without intravenous contrast. RADIATION DOSE REDUCTION: This exam was performed according to the departmental dose-optimization program which includes automated exposure control, adjustment of the mA and/or kV according to patient size and/or use of iterative reconstruction technique. COMPARISON:  Head CT and MRI 03/05/2023 FINDINGS: Brain: There is no evidence of an acute cortically based infarct, intracranial hemorrhage, midline shift, or extra-axial fluid collection. The ventricles are normal in size.  Mild-to-moderate vasogenic edema in the right frontal lobe, left occipital lobe, and right cerebellar hemisphere is unchanged and without significant associated mass effect. Mild to moderate hypodensities elsewhere in the cerebral white matter bilaterally are nonspecific but compatible with chronic small vessel ischemic disease. 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 Vascular: Calcified atherosclerosis at the skull base. No hyperdense vessel. Skull: No acute fracture or suspicious osseous lesion. Sinuses/Orbits: Minimal mucosal thickening in the paranasal sinuses. Clear mastoid air cells. Unremarkable orbits. Other: None. IMPRESSION: 1. Unchanged edema associated with known lesions in the right frontal lobe, left occipital lobe, and right cerebellum. No significant mass effect. 2. No evidence of a new intracranial abnormality. Electronically Signed   By: Sebastian Ache M.D.   On: 03/07/2023 11:23   DG Chest Port 1 View  Result Date: 03/07/2023 CLINICAL DATA:  Pleural effusion. EXAM: PORTABLE CHEST 1 VIEW COMPARISON:  Chest radiograph and CT 03/05/2023 FINDINGS: The cardiac silhouette remains enlarged. Aortic atherosclerosis is noted. Veiling opacities in the lower lungs are consistent with persistent small to moderate pleural effusions as shown on CT. Bilateral interstitial and patchy airspace opacities have slightly worsened. No pneumothorax is identified. IMPRESSION: 1. Slight worsening of bilateral lung opacities which may reflect edema or pneumonia. 2. Persistent bilateral pleural effusions. Electronically Signed   By: Sebastian Ache M.D.   On: 03/07/2023 09:50   US Venous Img Lower Bilateral (DVT)  Result Date: 03/06/2023 CLINICAL DATA:  Lower extremity edema EXAM: BILATERAL LOWER EXTREMITY VENOUS DOPPLER ULTRASOUND TECHNIQUE: Gray-scale sonography with graded compression, as well as color Doppler and duplex ultrasound were performed to evaluate the lower extremity deep venous systems from  the level of the common femoral vein and including the common femoral, femoral, profunda femoral, popliteal and calf veins including the posterior tibial, peroneal and gastrocnemius veins when visible. The superficial great saphenous vein was also interrogated. Spectral Doppler was utilized to evaluate flow at rest and with distal augmentation maneuvers in the common femoral, femoral and popliteal veins. COMPARISON:  None Available. FINDINGS: RIGHT LOWER EXTREMITY Common Femoral Vein: No evidence of thrombus. Normal compressibility, respiratory  phasicity and response to augmentation. Saphenofemoral Junction: No evidence of thrombus. Normal compressibility and flow on color Doppler imaging. Profunda Femoral Vein: No evidence of thrombus. Normal compressibility and flow on color Doppler imaging. Femoral Vein: No evidence of thrombus. Normal compressibility, respiratory phasicity and response to augmentation. Popliteal Vein: No evidence of thrombus. Normal compressibility, respiratory phasicity and response to augmentation. Calf Veins: No evidence of thrombus. Normal compressibility and flow on color Doppler imaging. Superficial Great Saphenous Vein: No evidence of thrombus. Normal compressibility. Venous Reflux:  None. Other Findings:  None. LEFT LOWER EXTREMITY Common Femoral Vein: No evidence of thrombus. Normal compressibility, respiratory phasicity and response to augmentation. Saphenofemoral Junction: No evidence of thrombus. Normal compressibility and flow on color Doppler imaging. Profunda Femoral Vein: No evidence of thrombus. Normal compressibility and flow on color Doppler imaging. Femoral Vein: No evidence of thrombus. Normal compressibility, respiratory phasicity and response to augmentation. Popliteal Vein: No evidence of thrombus. Normal compressibility, respiratory phasicity and response to augmentation. Calf Veins: No evidence of thrombus. Normal compressibility and flow on color Doppler imaging.  Superficial Great Saphenous Vein: No evidence of thrombus. Normal compressibility. Venous Reflux:  None. Other Findings:  None. IMPRESSION: No evidence of deep venous thrombosis in either lower extremity. Electronically Signed   By: Alcide Clever M.D.   On: 03/06/2023 19:43   ECHOCARDIOGRAM COMPLETE  Result Date: 03/06/2023    ECHOCARDIOGRAM REPORT   Patient Name:  Kemon Arvelo    Date of      03/06/2023                           Exam: Medical Rec #: [id number]  Height:      60.0 in Accession #:   [id number] Weight:      99.2 lb Date of Birth: August 12, 1946 BSA:         1.386 m Patient Age:   76 years   BP:          Not listed in chart/Not listed in chart                                        mmHg Patient        M          HR:          Not listed in chart bpm. Gender: Exam Location: [facility] Procedure: 2D Echo, Cardiac Doppler and Color Doppler Indications:     Tamponade I31.4  History:         Patient has prior history of Echocardiogram examinations, most                  recent 03/05/2023. CHF; Risk Factors:Hypertension. Tobacco abuse.  Sonographer:     Cristela Blue Referring Phys:  [id number] [facility] MICHELLE TANG Diagnosing Phys: Marcina Millard MD IMPRESSIONS  1. Left ventricular ejection fraction, by estimation, is 60 to 65%. The left ventricle has normal function. The left ventricle has no regional wall motion abnormalities. Left ventricular diastolic parameters are indeterminate.  2. Right ventricular systolic function is normal. The right ventricular size is normal.  3. Successful pericardiocentesis with removal of 1100 cc serosanguinous pericardial fluid.. Findings are consistent with cardiac tamponade.  4. The mitral valve is normal in structure. No evidence of mitral valve regurgitation. No evidence of mitral stenosis.  5. The aortic valve is normal in structure.  Aortic valve regurgitation is not visualized. No aortic stenosis is present.  6. The inferior vena cava is normal in size with greater than 50% respiratory  variability, suggesting right atrial pressure of 3 mmHg. FINDINGS  Left Ventricle: Left ventricular ejection fraction, by estimation, is 60 to 65%. The left ventricle has normal function. The left ventricle has no regional wall motion abnormalities. The left ventricular internal cavity size was normal in size. There is  no left ventricular hypertrophy. Left ventricular diastolic parameters are indeterminate. Right Ventricle: The right ventricular size is normal. No increase in right ventricular wall thickness. Right ventricular systolic function is normal. Left Atrium: Left atrial size was normal in size. Right Atrium: Right atrial size was normal in size. Pericardium: Successful pericardiocentesis with removal of 1100 cc serosanguinous pericardial fluid. There is no evidence of pericardial effusion. The pericardial effusion is circumferential. There is diastolic collapse of the right ventricular free wall. There is evidence of cardiac tamponade. Mitral Valve: The mitral valve is normal in structure. No evidence of mitral valve regurgitation. No evidence of mitral valve stenosis. Tricuspid Valve: The tricuspid valve is normal in structure. Tricuspid valve regurgitation is not demonstrated. No evidence of tricuspid stenosis. Aortic Valve: The aortic valve is normal in structure. Aortic valve regurgitation is not visualized. No aortic stenosis is present. Pulmonic Valve: The pulmonic valve was normal in structure. Pulmonic valve regurgitation is not visualized. No evidence of pulmonic stenosis. Aorta: The aortic root is normal in size and structure. Venous: The inferior vena cava is normal in size with greater than 50% respiratory variability, suggesting right atrial pressure of 3 mmHg. IAS/Shunts: No atrial level shunt detected by color flow Doppler.  LEFT VENTRICLE PLAX 2D LVIDd:         3.85 cm LVIDs:         3.10 cm LV PW:         0.75 cm LV IVS:        1.10 cm  RIGHT VENTRICLE RV Basal diam:  3.40 cm RV Mid diam:     2.50 cm LEFT ATRIUM         Index       RIGHT ATRIUM           Index LA diam:    4.40 cm 3.18 cm/m  RA Area:     12.50 cm                                 RA Volume:   27.90 ml  20.14 ml/m Marcina Millard MD Electronically signed by Marcina Millard MD Signature Date/Time: 03/06/2023/5:12:27 PM    Final    CARDIAC CATHETERIZATION  Result Date: 03/06/2023 Successful pericardiocentesis with placement of pigtail catheter   ECHOCARDIOGRAM COMPLETE  Result Date: 03/06/2023    ECHOCARDIOGRAM REPORT   Patient Name:   Eren Hehir    Date of Exam: 03/05/2023 Medical Rec #:  [id number]  Height:       57.0 in Accession #:    [id number] Weight:       100.0 lb Date of Birth:  1945-12-26 BSA:          1.340 m Patient Age:    76 years   BP:           123/76 mmHg Patient Gender: M          HR:           74 bpm.  Exam Location:  [facility] Procedure: 2D Echo, Cardiac Doppler and Color Doppler Indications:     I31.3 Pericardial Effusion  History:         Patient has prior history of Echocardiogram examinations, most                  recent 07/27/2018. CHF; Risk Factors:Hypertension, Current                  Smoker and Alcohol Abuse.  Sonographer:     Daphine Deutscher RDCS Referring Phys:  [id number] Verdene Lennert Diagnosing Phys: Yvonne Kendall MD IMPRESSIONS  1. Left ventricular ejection fraction, by estimation, is 55 to 60%. The left ventricle has normal function. The left ventricle has no regional wall motion abnormalities. Left ventricular diastolic parameters are consistent with Grade I diastolic dysfunction (impaired relaxation).  2. Right ventricular systolic function is mildly reduced. The right ventricular size is normal. There is normal pulmonary artery systolic pressure.  3. Large pericardial effusion. The pericardial effusion is circumferential. Findings are consistent with cardiac tamponade.  4. The mitral valve is normal in structure. Mild mitral valve regurgitation.  5. Tricuspid valve regurgitation is mild to  moderate.  6. The aortic valve is tricuspid. There is mild calcification of the aortic valve. There is mild thickening of the aortic valve. Aortic valve regurgitation is mild to moderate.  7. The inferior vena cava is dilated in size with <50% respiratory variability, suggesting right atrial pressure of 15 mmHg. Conclusion(s)/Recommendation(s): Findings were conveyed to Dr. Gillis Santa at 8:02 AM on 03/06/2023 by Dr. Cristal De[place] End. FINDINGS  Left Ventricle: Left ventricular ejection fraction, by estimation, is 55 to 60%. The left ventricle has normal function. The left ventricle has no regional wall motion abnormalities. The left ventricular internal cavity size was normal in size. There is  no left ventricular hypertrophy. Left ventricular diastolic parameters are consistent with Grade I diastolic dysfunction (impaired relaxation). Right Ventricle: The right ventricular size is normal. No increase in right ventricular wall thickness. Right ventricular systolic function is mildly reduced. There is normal pulmonary artery systolic pressure. The tricuspid regurgitant velocity is 1.71 m/s, and with an assumed right atrial pressure of 15 mmHg, the estimated right ventricular systolic pressure is 26.7 mmHg. Left Atrium: Left atrial size was normal in size. Right Atrium: Right atrial size was normal in size. Pericardium: A large pericardial effusion is present. The pericardial effusion is circumferential. The pericardial effusion appears to contain fibrous material. There is diastolic collapse of the right ventricular free wall and excessive respiratory variation in the mitral valve spectral Doppler velocities. There is evidence of cardiac tamponade. Mitral Valve: The mitral valve is normal in structure. There is mild thickening of the mitral valve leaflet(s). Mild mitral valve regurgitation. Tricuspid Valve: The tricuspid valve is normal in structure. Tricuspid valve regurgitation is mild to moderate. Aortic Valve: The  aortic valve is tricuspid. There is mild calcification of the aortic valve. There is mild thickening of the aortic valve. Aortic valve regurgitation is mild to moderate. Aortic regurgitation PHT measures 507 msec. Aortic valve mean gradient measures 2.5 mmHg. Aortic valve peak gradient measures 4.3 mmHg. Aortic valve area, by VTI measures 2.84 cm. Pulmonic Valve: The pulmonic valve was normal in structure. Pulmonic valve regurgitation is trivial. No evidence of pulmonic stenosis. Aorta: The aortic root and ascending aorta are structurally normal, with no evidence of dilitation. Pulmonary Artery: The pulmonary artery is of normal size. Venous: The inferior vena cava is dilated in size  with less than 50% respiratory variability, suggesting right atrial pressure of 15 mmHg. IAS/Shunts: The interatrial septum was not well visualized.  LEFT VENTRICLE PLAX 2D LVIDd:         3.50 cm   Diastology LVIDs:         2.50 cm   LV e' medial:    4.46 cm/s LV PW:         0.90 cm   LV E/e' medial:  9.7 LV IVS:        0.80 cm   LV e' lateral:   6.31 cm/s LVOT diam:     2.00 cm   LV E/e' lateral: 6.9 LV SV:         46 LV SV Index:   34 LVOT Area:     3.14 cm  RIGHT VENTRICLE RV Basal diam:  3.10 cm RV S prime:     7.29 cm/s TAPSE (M-mode): 1.3 cm LEFT ATRIUM             Index        RIGHT ATRIUM           Index LA diam:        3.10 cm 2.31 cm/m   RA Area:     10.70 cm LA Vol (A2C):   37.7 ml 28.14 ml/m  RA Volume:   27.40 ml  20.45 ml/m LA Vol (A4C):   25.3 ml 18.88 ml/m LA Biplane Vol: 33.9 ml 25.30 ml/m  AORTIC VALVE AV Area (Vmax):    2.86 cm AV Area (Vmean):   2.60 cm AV Area (VTI):     2.84 cm AV Vmax:           103.69 cm/s AV Vmean:          74.930 cm/s AV VTI:            0.161 m AV Peak Grad:      4.3 mmHg AV Mean Grad:      2.5 mmHg LVOT Vmax:         94.33 cm/s LVOT Vmean:        62.000 cm/s LVOT VTI:          0.145 m LVOT/AV VTI ratio: 0.90 AI PHT:            507 msec  AORTA Ao Root diam: 3.20 cm Ao Asc diam:  3.50  cm MITRAL VALVE               TRICUSPID VALVE MV Area (PHT): 4.28 cm    TR Peak grad:   11.7 mmHg MV Decel Time: 177 msec    TR Vmax:        171.00 cm/s MV E velocity: 43.23 cm/s MV A velocity: 63.73 cm/s  SHUNTS MV E/A ratio:  0.68        Systemic VTI:  0.15 m                            Systemic Diam: 2.00 cm Yvonne Kendall MD Electronically signed by Yvonne Kendall MD Signature Date/Time: 03/06/2023/8:13:55 AM    Final    MR Brain W and Wo Contrast  Result Date: 03/05/2023 CLINICAL DATA:  Mental status change, unknown cause EXAM: MRI HEAD WITHOUT AND WITH CONTRAST TECHNIQUE: Multiplanar, multiecho pulse sequences of the brain and surrounding structures were obtained without and with intravenous contrast. CONTRAST:  5mL GADAVIST GADOBUTROL 1 MMOL/ML IV SOLN COMPARISON:  Same day  CT head FINDINGS: Brain: Negative for an acute infarct. No hydrocephalus. No extra-axial fluid collection. Small microhemorrhage in the right lentiform nucleus. Chronic left cerebellar infarct. There is sequela mild-to-moderate chronic microvascular ischemic change. There are multiple lesions, including- -1.0 x 0.7 cm contrast-enhancing lesion in the right frontal lobe with surrounding vasogenic edema (series 18, image 108) - 0.8 x 0.7 cm contrast-enhancing lesions in in the left occipital lobe with surrounding vasogenic edema (series 18, image 69). -1.2 x 1.1 cm contrast-enhancing lesion in the superior right cerebellum mild surrounding vasogenic edema (series 18, image 50) -0.6 x 0.4 cm dural-based contrast-enhancing lesion along the left frontal convexity (series 93, image 176). Vascular: Normal flow voids. Skull and upper cervical spine: Normal marrow signal. Sinuses/Orbits: No middle ear or mastoid effusion. Mild mucosal thickening bilateral ethmoid air cells. Orbits are unremarkable. Other: None. IMPRESSION: 1. Multiple contrast-enhancing lesions in the right frontal lobe, left occipital lobe, and superior right cerebellum with  surrounding vasogenic edema. 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 2. Small dural-based contrast-enhancing lesion along the left frontal convexity could represent a small meningimoa, but metastatic disease is not excluded. Electronically Signed   By: Lorenza Cambridge M.D.   On: 03/05/2023 16:48   CT CHEST ABDOMEN PELVIS W CONTRAST  Result Date: 03/05/2023 CLINICAL DATA:  Brain mass. Assess for occult malignancy. * Tracking Code: BO * EXAM: CT CHEST, ABDOMEN, AND PELVIS WITH CONTRAST TECHNIQUE: Multidetector CT imaging of the chest, abdomen and pelvis was performed following the standard protocol during bolus administration of intravenous contrast. RADIATION DOSE REDUCTION: This exam was performed according to the departmental dose-optimization program which includes automated exposure control, adjustment of the mA and/or kV according to patient size and/or use of iterative reconstruction technique. CONTRAST:  75mL OMNIPAQUE IOHEXOL 300 MG/ML  SOLN COMPARISON:  Chest CT without contrast 04/28/2015. X-ray chest earlier 03/05/2023 FINDINGS: CT CHEST FINDINGS Cardiovascular: Very large pericardial effusion. The heart itself is nonenlarged. Significant coronary artery calcifications. The thoracic aorta has a normal course and caliber with mild atherosclerotic calcified plaque. Mediastinum/Nodes: Normal caliber thoracic esophagus. Preserved thyroid gland. No specific abnormal lymph node enlargement seen including in the axillary regions, hilum. Prominent right paratracheal node on series 2, image 18 measures 18 by 7 mm. This could be 2 adjacent nodes. Subcarinal node series 2, image 29 measures 2.7 by 1.1 cm, mildly enlarged. Lungs/Pleura: Moderate right and small left effusion. There are some loculated components on the left with fluid tracking along the interlobar fissure. Adjacent parenchymal opacities. Atelectasis versus infiltrate. There is a opacity specifically in the lingula as well.  Scattered ground-glass elsewhere in the visualized lungs. No pneumothorax. No obvious mass. Significant breathing motion throughout the examination. A subtle mass lesion could be obscured by the lung opacity in the effusions. Follow up study could be performed after improvement of the effusions. Musculoskeletal: Mild degenerative changes along the spine. CT ABDOMEN PELVIS FINDINGS Hepatobiliary: Contrast exam in the abdomen is more in the late arterial phase and portal venous phase. Patent portal vein. Gallbladder is present. Small cysts seen in segment 4 of the liver. Pancreas: Unremarkable. No pancreatic ductal dilatation or surrounding inflammatory changes. Spleen: Normal in size without focal abnormality. Adrenals/Urinary Tract: Slight nonspecific thickening of the adrenal glands but unchanged from previous. No enhancing renal mass or collecting system dilatation. There are some small low-attenuation lesions in the left kidney which are too small to completely characterize but likely benign cysts, Bosniak 2 lesions. No specific imaging follow-up. The ureters have normal  course and caliber extending down to the bladder. Diffuse wall thickening of the urinary bladder. Stomach/Bowel: On this non oral contrast exam, large bowel has a normal caliber. Redundant course of the sigmoid colon. There are some areas of mild wall thickening along loops of bowel, nonspecific. The stomach is mildly distended with fluid. Mild fold thickening suggested small bowel is nondilated. Vascular/Lymphatic: Normal caliber aorta and IVC with moderate calcified plaque along the aorta and iliac vessels. There are several enlarged nodes identified in the retroperitoneum and upper abdomen. Example aortocaval node posteriorly on series 2, image 64 measures 17 by 13 mm. No next of the celiac series 2, image 59 measures 16 by 14 mm. Left para-node anteriorly on series 2, image 68 measures 17 by 8 mm. A few small retrocrural nodes are also  identified. Reproductive: Enlarged heterogeneous prostate. Please correlate with patient's PSA. Other: Anasarca.  Mesenteric haziness.  Trace ascites.  No free air Musculoskeletal: Mild degenerative changes along the spine. IMPRESSION: Large pericardial effusion. Please correlate with and new symptoms including for tamponade. Moderate right and small left effusion with some loculated components on the left. Adjacent parenchymal opacities. Atelectasis versus infiltrate. Recommend follow up after clearance of the effusions. Few borderline enlarged lymph nodes identified including mediastinum, upper retroperitoneum. Enlarged prostate with mass effect along the bladder with bladder wall thickening. Please correlate with patient's PSA. No bowel obstruction or free air. There is scattered mesenteric haziness with trace ascites. There are some small areas of wall thickening along the colon but this could simply relate to the other signs of edema. Electronically Signed   By: Karen Kays M.D.   On: 03/05/2023 15:03   DG Chest Port 1 View  Result Date: 03/05/2023 CLINICAL DATA:  Shortness of breath EXAM: PORTABLE CHEST 1 VIEW COMPARISON:  07/27/2018 FINDINGS: Cardiac silhouette is enlarged with diffuse interstitial opacities throughout both lungs compatible with edema related to CHF. Basilar atelectasis noted, worse on the left. Difficult to exclude small effusions. Also left lower lobe retrocardiac opacity obscures the left hemidiaphragm concerning for possible superimposed pneumonia. No pneumothorax. Trachea midline. Aorta atherosclerotic. No acute osseous finding. IMPRESSION: 1. Cardiomegaly with diffuse interstitial edema pattern. 2. Left lower lobe airspace consolidation concerning for superimposed pneumonia. 3. Suspect small effusions. Electronically Signed   By: Judie Petit.  Shick M.D.   On: 03/05/2023 13:20   CT Head Wo Contrast  Result Date: 03/05/2023 CLINICAL DATA:  Mental status change of unknown cause. Memory  disturbance. EXAM: CT HEAD WITHOUT CONTRAST TECHNIQUE: Contiguous axial images were obtained from the base of the skull through the vertex without intravenous contrast. RADIATION DOSE REDUCTION: This exam was performed according to the departmental dose-optimization program which includes automated exposure control, adjustment of the mA and/or kV according to patient size and/or use of iterative reconstruction technique. COMPARISON:  07/27/2018 FINDINGS: Brain: No focal abnormality affects the brainstem. Question mild edema and mass effect in the inferior right cerebellum. 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 There is abnormal vasogenic edema within the right posterior frontal region worrisome for an underlying mass lesion. In the left hemisphere, there is abnormal vasogenic edema in the occipital lobe worrisome for the presence of an occult mass lesion. No visible hemorrhage. Hydrocephalus or extra-axial collection. Vascular: There is atherosclerotic calcification of the major vessels at the base of the brain. Skull: No calvarial abnormality. Sinuses/Orbits: Clear/normal Other: None IMPRESSION: 1. Abnormal vasogenic edema in the right posterior frontal region and in the left occipital lobe worrisome for the presence  of occult mass lesions. Question mild edema and mass effect in the inferior right cerebellum. No visible hemorrhage. MRI with and without contrast is recommended for further evaluation. 2. 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 3. Atherosclerotic calcification of the major vessels at the base of the brain. Electronically Signed   By: Paulina Fusi M.D.   On: 03/05/2023 12:52    PERFORMANCE STATUS (ECOG) : 1 - Symptomatic but completely ambulatory  Review of Systems Unless otherwise noted, a complete review of systems is negative.  Physical Exam General: NAD Cardiovascular: regular rate and rhythm Pulmonary: clear ant fields Abdomen: soft, nontender, + bowel  sounds GU: no suprapubic tenderness Extremities: no edema, no joint deformities Skin: no rashes Neurological: Weakness but otherwise nonfocal  IMPRESSION: Patient accompanied by his daughter.  Daughter declined interpreter.  Daughter states that family is aware that he has stage IV and incurable lung cancer.  However, they have not communicated this to the patient.  We discussed that patient has a right to autonomously guide his own healthcare.  Daughter spoke regarding cultural limitations of this.  Family is interested in treatment but would want to ensure that his quality of life is maximized.  They are not interested in systemic chemotherapy as they feel that would negatively impact his quality of life.  At baseline, patient lives at home with his wife and children.  Patient is mostly independent with his own care.  He does require some assistance with family for bathing and dressing.  Symptomatically, patient has insomnia, likely from steroids.  Will dose reduce dexamethasone to 2 mg in a.m.  Patient also has pruritus and appears to have very dry skin.  Recommended emollient lotion.  Patient will benefit from addressing advance directives and CODE STATUS when interpreter is present.  PLAN: -Continue current scope of treatment -Recommend reducing dexamethasone to 2 mg daily -Recommend emollient lotion -Follow-up TBD   Patient expressed understanding and was in agreement with this plan. He also understands that He can call the clinic at any time with any questions, concerns, or complaints.     Time Total: 20 minutes  Visit consisted of counseling and education dealing with the complex and emotionally intense issues of symptom management and palliative care in the setting of serious and potentially life-threatening illness.Greater than 50%  of this time was spent counseling and coordinating care related to the above assessment and plan.  Signed by: Laurette Schimke, PhD, NP-C

## 2023-04-02 NOTE — Progress Notes (Signed)
Met with patient and his daughter to discuss recent NGS results. All questions answered during visit. Informed to expect call from pharmacy to further review oral chemotherapy. Instructed that will follow up with Dr. Cathie Hoops once he has started taking oral chemotherapy. Nothing further needed at this time. Instructed to call with any questions or needs. Pt and daughter verbalized understanding.

## 2023-04-02 NOTE — Assessment & Plan Note (Signed)
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 Prognosis is poor. Clinically patient has improved. He has RET mutation. Recommend  Retevmo 120mg  BID.  Potential side effects were reviewed with patient and daughter they agree with the plan.  2nd line consider immunotherapy. Pt is not interested in chemotherapy

## 2023-04-02 NOTE — Assessment & Plan Note (Signed)
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 Monitor sodium levels. 

## 2023-04-02 NOTE — Progress Notes (Signed)
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 Prognosis is poor. Clinically patient has improved. He has RET mutation. Recommend  Retevmo 120mg  BID.  Potential side effects were reviewed with patient and daughter they agree with the plan.  2nd line consider immunotherapy. Pt is not interested in chemotherapy   Hyponatremia Likely secondary to chronic alcohol use and possible SIADH. Monitor sodium levels.  Pericardial effusion S/p pericardiocentesis. Clinically stable.   Brain mass Patient has establish care with radiation oncology for palliative radiation. Continue dexamethasone, due to side effects [ insomnia], recommend patient to decrease to 2mg  daily  Elevated LFTs Secondary to chronic alcohol use. improved Encourage patient's alcohol cessation effort.   interpreter service was utilized for the entire encounter today.  Orders Placed This Encounter  Procedures   CBC with Differential (Cancer Center Only)    Standing Status:   Future    Number of Occurrences:   1    Standing Expiration Date:   04/01/2024   CMP (Cancer Center only)    Standing Status:   Future    Number of Occurrences:   1    Standing Expiration Date:   04/01/2024   Follow-up 2 week after started on  Retevmo  All questions were answered. The patient knows to call the clinic with any problems, questions or concerns.  Rickard Patience, MD, PhD [facility] Hematology Oncology 04/02/2023     HISTORY OF PRESENTING ILLNESS:  Cody [name] 77 y.o. male presents to establish care for metastatic lung cancer I have reviewed his chart and materials related to his cancer extensively and collaborated history with the patient. Summary of oncologic history is as follows: Oncology History  Metastatic non-small cell lung cancer (HCC)  03/05/2023 Imaging   CT chest abdomen pelvis with contrast showed large pericardial effusion. Moderate right and small left effusion with some loculated components on the left. Adjacent parenchymal opacities. Atelectasis versus infiltrates. Few borderline enlarged lymph nodes including mediastinum, upper retroperitoneum. Enlarged prostate with mass effect along the bladder with bladder wall thickening. No bowel obstruction or free air. Small area of wall thickening along the colon.    03/05/2023 Imaging   CT head showed abdominal vasogenic edema in the right posterior frontal region and in the left occipital lobe worrisome for presence of occult mass lesions. Questionable mild edema and mass effect in the inferior right cerebellum. No visible hemorrhage. 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 Atherosclerotic calcification of the major vessels at the base of brain.    03/05/2023 Imaging   MRI brain with and without contrast showed multiple contrast-enhancing lesions in the right frontal lobe, left occipital lobe, superior right cerebellum with surrounding vasogenic edema. Worrisome for intracranial metastatic disease. Small dural based contrast-enhancing lesions along the left frontal convexity could represent a small hemangioma. Metastatic disease is not excluded.    03/05/2023 - 03/12/2023 Hospital Admission   Patient presented emergency room due to confusion, shortness of breath. CT head and MRI showed multiple contrast-enhancing lesions with vasogenic edema CT chest abdomen pelvis showed large pericardial  effusion, pleural effusion, mediastinal/upper retroperitoneum  lymphadenopathy Patient was treated with Decadron, neurosurgery recommended no intervention. Status post pericardiocentesis on 03/07/2023.  Pigtail catheter was initially placed and later on removed.  TTE showed no pericardial effusion. Bilateral pleural effusion, status post thoracentesis.  Cytology positive. 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  Patient was treated with 3% saline clinic sodium improved. Gout with uric acid of 9.7.  Started on colchicine. Alcohol use disorder, status post CIWA protocol with Ativan as needed.   03/07/2023 Cancer Staging   Staging form: Lung, AJCC 8th Edition - Clinical stage from 03/07/2023: Stage IV (cTX, cN2, cM1) - Signed by Rickard Patience, MD on 03/13/2023 Stage prefix: Initial diagnosis   03/07/2023 Echocardiogram   1. Left ventricular ejection fraction, by estimation, is 60 to 65%. The left ventricle has normal function. The left ventricle has no regional  wall motion abnormalities. Indeterminate diastolic filling due to E-A fusion.   2. Right ventricular systolic function is normal. The right ventricular size is normal.   3. Moderate pericardial effusion.   4. The mitral valve is normal in structure. Mild mitral valve regurgitation. No evidence of mitral stenosis.   5. The aortic valve is normal in structure. Aortic valve regurgitation is mild. No aortic stenosis is present.   6. The inferior vena cava is normal in size with greater than 50% respiratory variability, suggesting right atrial pressure of 3 mmHg.     03/07/2023 Procedure   -5/9 S/p pericardiocentesis with removal of 1.1 L of bloody fluid.  Pigtail catheter left in place.  -Cytology came back positive for malignancy.  Metastatic adenocarcinoma, compatible with pulmonary adenocarcinoma   -5/9 s/p  thoracentesis, cytology is negative.    03/08/2023 Initial Diagnosis   Metastatic non-small cell lung cancer (HCC)  [facility] NGS showed RET [id number] missense variant  [VAF33.8%] NF1 frame shift, TP53, NFE2L2, CDKN2A, CKKN2B TMB 15.8, MSI stable.   03/11/2023 Echocardiogram   1. Left ventricular ejection fraction, by estimation, is 60 to 65%. The left ventricle has normal function.   2. Mild to moderate mitral valve regurgitation.   3. Aortic valve regurgitation is mild to moderate    03/23/2023 Imaging   PET scan showed 13 mm irregular left lower lobe nodule, likely corresponding to the patient's known primary bronchogenic carcinoma.   Thoracic and upper abdominal nodal metastases, as described above.   Irregular appearance of the posterior gastric antrum, with associated hypermetabolism, as above. Appearance favors gastritis versus gastric ulcer. Consider upper endoscopy for further evaluation.   Prior pericardial effusion and bilateral pleural effusions have resolved.    Today patient was accompanied by her daughter Tiajuana Amass the clinic.  He is getting whole brain radiation.  He takesDex 2mg  BID. Speaks with [place]  interpreter service was utilized for the entire encounter today.   Patient tolerates whole brain radiation.  + insomnia,  He feels well today, Denies SOB, cough.  He has stopped smoking and drinking alcohol    MEDICAL HISTORY:  Past Medical History:  Diagnosis Date   Alcohol dependence (HCC)    CHF (congestive heart failure) (HCC)    Hypertension    Tobacco abuse     SURGICAL HISTORY: Past Surgical History:  Procedure Laterality Date   APPENDECTOMY     LEFT HEART CATH AND CORONARY ANGIOGRAPHY N/A 08/25/2018   Procedure: LEFT HEART CATH AND CORONARY ANGIOGRAPHY;  Surgeon: Laurier Nancy, MD;  Location: [facility] INVASIVE CV LAB;  Service: Cardiovascular;  Laterality: N/A;  PERICARDIOCENTESIS N/A 03/06/2023   Procedure: PERICARDIOCENTESIS;  Surgeon: Marcina Millard, MD;  Location: [facility] INVASIVE CV LAB;  Service: Cardiovascular;  Laterality: N/A;    SOCIAL HISTORY: Social History   Socioeconomic History   Marital  status: Married    Spouse name: Not on file   Number of children: Not on file   Years of education: Not on file   Highest education level: Not on file  Occupational History   Not on file  Tobacco Use   Smoking status: Former    Packs/day: 0.50    Years: 50.00    Additional pack years: 0.00    Total pack years: 25.00    Types: Cigarettes    Quit date: 03/08/2023    Years since quitting: 0.0    Passive exposure: Current   Smokeless tobacco: Never  Vaping Use   Vaping Use: Never used  Substance and Sexual Activity   Alcohol use: Not Currently    Comment: stopping drinking approx 03/03/23   Drug use: Never   Sexual activity: Not Currently    Birth control/protection: None  Other Topics Concern   Not on file  Social History Narrative   Not on file   Social Determinants of Health   Financial Resource Strain: Low Risk  (08/25/2018)   Overall Financial Resource Strain (CARDIA)    Difficulty of Paying Living Expenses: Not very hard  Food Insecurity: Not on file  Transportation Needs: Unknown (08/25/2018)   PRAPARE - Transportation    Lack of Transportation (Medical): No    Lack of Transportation (Non-Medical): Not on file  Physical Activity: Not on file  Stress: No Stress Concern Present (08/25/2018)   [employer] of Occupational Health - Occupational Stress Questionnaire    Feeling of Stress : Only a little  Social Connections: Not on file  Intimate Partner Violence: Not on file    FAMILY HISTORY: No family history on file.  ALLERGIES:  is allergic to shellfish allergy.  MEDICATIONS:  Current Outpatient Medications  Medication Sig Dispense Refill   allopurinol (ZYLOPRIM) 100 MG tablet Take 1 tablet (100 mg total) by mouth daily. 30 tablet 11   amiodarone (PACERONE) 200 MG tablet Take 1 tablet (200 mg total) by mouth daily. 30 tablet 5   colchicine 0.6 MG tablet Take 1 tablet (0.6 mg total) by mouth daily. 30 tablet 2   dexamethasone (DECADRON) 2 MG tablet Take 1  tablet (2 mg total) by mouth every 12 (twelve) hours. 60 tablet 5   finasteride (PROSCAR) 5 MG tablet Take 1 tablet (5 mg total) by mouth daily. 90 tablet 3   fluconazole (DIFLUCAN) 100 MG tablet Take 1 tablet (100 mg total) by mouth daily. 7 tablet 0   folic acid (FOLVITE) 1 MG tablet Take 1 tablet (1 mg total) by mouth daily. 30 tablet 2   iron polysaccharides (NIFEREX) 150 MG capsule Take 1 capsule (150 mg total) by mouth daily. 30 capsule 2   levETIRAcetam (KEPPRA) 500 MG tablet Take 1 tablet (500 mg total) by mouth 2 (two) times daily. 60 tablet 5   Multiple Vitamin (MULTIVITAMIN WITH MINERALS) TABS tablet Take 1 tablet by mouth daily. 30 tablet 2   polyethylene glycol (MIRALAX / GLYCOLAX) 17 g packet Take 17 g by mouth daily as needed for mild constipation or moderate constipation. 14 each 0   tamsulosin (FLOMAX) 0.4 MG CAPS capsule Take 1 capsule (0.4 mg total) by mouth daily. 90 capsule 3   thiamine (VITAMIN B-1) 100 MG tablet  Take 1 tablet (100 mg total) by mouth daily. 30 tablet 2   Vitamin D, Ergocalciferol, (DRISDOL) 1.25 MG (50000 UNIT) CAPS capsule Take 1 capsule (50,000 Units total) by mouth every 7 (seven) days. 12 capsule 0   No current facility-administered medications for this visit.    Review of Systems  Constitutional:  Positive for fatigue. Negative for appetite change, chills and fever.  HENT:   Negative for hearing loss and voice change.   Eyes:  Negative for eye problems and icterus.  Respiratory:  Negative for chest tightness, cough and shortness of breath.   Cardiovascular:  Negative for chest pain and leg swelling.  Gastrointestinal:  Negative for abdominal distention and abdominal pain.  Endocrine: Negative for hot flashes.  Genitourinary:  Negative for difficulty urinating, dysuria and frequency.   Musculoskeletal:  Negative for arthralgias.  Skin:  Negative for itching and rash.  Neurological:  Negative for light-headedness and numbness.  Hematological:   Negative for adenopathy. Does not bruise/bleed easily.  Psychiatric/Behavioral:  Positive for sleep disturbance. Negative for confusion.      PHYSICAL EXAMINATION: ECOG PERFORMANCE STATUS: 2 - Symptomatic, <50% confined to bed  Vitals:   04/02/23 1431  BP: (!) 144/74  Pulse: 97  Resp: 20  Temp: (!) 96.6 F (35.9 C)  SpO2: 100%   Filed Weights   04/02/23 1431  Weight: 95 lb 8 oz (43.3 kg)    Physical Exam Constitutional:      General: He is not in acute distress.    Appearance: He is ill-appearing. He is not diaphoretic.  HENT:     Head: Normocephalic and atraumatic.     Mouth/Throat:     Pharynx: Oropharyngeal exudate present.  Eyes:     General: No scleral icterus. Cardiovascular:     Rate and Rhythm: Normal rate and regular rhythm.  Pulmonary:     Effort: Pulmonary effort is normal. No respiratory distress.  Abdominal:     General: There is no distension.  Musculoskeletal:        General: Normal range of motion.     Cervical back: Normal range of motion and neck supple.  Skin:    Findings: No erythema.  Neurological:     Mental Status: He is alert. Mental status is at baseline.     Cranial Nerves: No cranial nerve deficit.     Motor: No abnormal muscle tone.  Psychiatric:        Mood and Affect: Affect normal.      LABORATORY DATA:  I have reviewed the data as listed    Latest Ref Rng & Units 04/02/2023    3:55 PM 03/21/2023    3:13 PM 03/12/2023    4:26 AM  CBC  WBC 4.0 - 10.5 K/uL 11.8  17.6  12.6   Hemoglobin 13.0 - 17.0 g/dL 16.1  09.6  04.5   Hematocrit 39.0 - 52.0 % 38.7  35.2  34.5   Platelets 150 - 400 K/uL 261  241  267       Latest Ref Rng & Units 04/02/2023    3:55 PM 03/12/2023    4:26 AM 03/11/2023    5:42 AM  CMP  Glucose 70 - 99 mg/dL 86  94  409   BUN 8 - 23 mg/dL 28  19  17    Creatinine 0.61 - 1.24 mg/dL 8.11  9.14  7.82   Sodium 135 - 145 mmol/L 131  131  133   Potassium 3.5 - 5.1 mmol/L 4.7  4.2  4.2   Chloride 98 - 111 mmol/L  96  100  104   CO2 22 - 32 mmol/L 29  25  23    Calcium 8.9 - 10.3 mg/dL 8.9  8.2  8.3   Total Protein 6.5 - 8.1 g/dL 7.3  5.4  5.3   Total Bilirubin 0.3 - 1.2 mg/dL 0.6  0.5  0.7   Alkaline Phos 38 - 126 U/L 88  66  64   AST 15 - 41 U/L 35  75  59   ALT 0 - 44 U/L 67  106  88      RADIOGRAPHIC STUDIES: I have personally reviewed the radiological images as listed and agreed with the findings in the report. NM PET Image Initial (PI) Skull Base To Thigh  Result Date: 03/23/2023 CLINICAL DATA:  Initial treatment strategy for metastatic non-small cell lung cancer. EXAM: NUCLEAR MEDICINE PET SKULL BASE TO THIGH TECHNIQUE: 5.4 mCi F-18 FDG was injected intravenously. Full-ring PET imaging was performed from the skull base to thigh after the radiotracer. CT data was obtained and used for attenuation correction and anatomic localization. Fasting blood glucose: 85 mg/dl COMPARISON:  CT chest abdomen pelvis dated 03/05/2023 FINDINGS: Mediastinal blood pool activity: SUV max 1.8 Liver activity: SUV max NA NECK: No hypermetabolic cervical lymphadenopathy. Incidental CT findings: None. CHEST: 13 mm irregular left lower lobe nodule (series 3/image 87), max SUV 3.2, suspicious. Thoracic lymphadenopathy, including: --5 mm short axis right supraclavicular node (series 3/image 38), max SUV 5.8 --8 mm short axis high right paratracheal node (series 3/image 46), max SUV 8.3 --12 mm short axis subcarinal node (series 3/image 86), max SUV 9.9 --Left perihilar hypermetabolism, max SUV 5.2 Incidental CT findings: Prior pericardial effusion has resolved. Atherosclerotic calcifications of the arch. Moderate three-vessel coronary atherosclerosis. Patchy left lower lobe atelectasis. Prior pleural effusions have resolved. ABDOMEN/PELVIS: No abnormal hypermetabolism in the liver, spleen, pancreas, or adrenal glands. Irregular appearance of the posterior gastric antrum (series 3/image 83), non masslike but with associated mild  hypermetabolism in this region (max SUV 5.7). Upper abdominal and retroperitoneal lymphadenopathy, including: --10 mm short axis peripancreatic node (series 3/image 81), max SUV 7.1 --12 mm short axis celiac axis node (series 3/image 87), max SUV 7.1 --12 mm short axis right para-aortic node (series 3/image 90), max SUV 6.8 --12 mm short axis left para-aortic node (series 3/image 92), max SUV 7.0 Incidental CT findings: Atherosclerotic calcifications the abdominal aorta and branch vessels. Moderate colonic stool burden, suggesting mild constipation. Prostatomegaly, suggesting BPH, with thick-walled bladder, suggesting chronic bladder outlet obstruction. SKELETON: No focal hypermetabolic activity to suggest skeletal metastasis. Incidental CT findings: Degenerative changes of the visualized thoracolumbar spine. IMPRESSION: 13 mm irregular left lower lobe nodule, likely corresponding to the patient's known primary bronchogenic carcinoma. Thoracic and upper abdominal nodal metastases, as described above. Irregular appearance of the posterior gastric antrum, with associated hypermetabolism, as above. Appearance favors gastritis versus gastric ulcer. Consider upper endoscopy for further evaluation. Prior pericardial effusion and bilateral pleural effusions have resolved. Electronically Signed   By: Charline Bills M.D.   On: 03/23/2023 01:35   ECHOCARDIOGRAM LIMITED  Result Date: 03/11/2023    ECHOCARDIOGRAM LIMITED REPORT   Patient Name:   Cody [name]    Date of Exam: 03/11/2023 Medical Rec #:  [id number]  Height:       60.0 in Accession #:    [id number] Weight:       107.8 lb Date of Birth:  05-05-1946 BSA:  1.435 m Patient Age:    76 years   BP:           124/56 mmHg Patient Gender: M          HR:           77 bpm. Exam Location:  [facility] Procedure: Limited Echo, Cardiac Doppler and Color Doppler Indications:     Pericardial Effusion I31.3  History:         Patient has prior history of Echocardiogram  examinations, most                  recent 03/07/2023. CHF; Risk Factors:Hypertension. Alcohol                  dependence, tobacco abuse.  Sonographer:     Cristela Blue Referring Phys:  [id number] Lyn Hollingshead PARASCHOS Diagnosing Phys: Marcina Millard MD IMPRESSIONS  1. Left ventricular ejection fraction, by estimation, is 60 to 65%. The left ventricle has normal function.  2. Mild to moderate mitral valve regurgitation.  3. Aortic valve regurgitation is mild to moderate. FINDINGS  Left Ventricle: Left ventricular ejection fraction, by estimation, is 60 to 65%. The left ventricle has normal function. Pericardium: There is no evidence of pericardial effusion. Mitral Valve: Mild to moderate mitral valve regurgitation. Tricuspid Valve: Tricuspid valve regurgitation is mild. Aortic Valve: Aortic valve regurgitation is mild to moderate. Aortic regurgitation PHT measures 343 msec. Additional Comments: Spectral Doppler performed. Color Doppler performed.  LEFT VENTRICLE PLAX 2D LVIDd:         5.10 cm   Diastology LVIDs:         3.30 cm   LV e' medial: 5.55 cm/s LV PW:         0.80 cm LV IVS:        0.80 cm LVOT diam:     2.10 cm LVOT Area:     3.46 cm  RIGHT VENTRICLE RV Basal diam:  2.80 cm RV Mid diam:    2.60 cm TAPSE (M-mode): 1.2 cm LEFT ATRIUM             Index        RIGHT ATRIUM           Index LA diam:        3.10 cm 2.16 cm/m   RA Area:     15.90 cm LA Vol (A2C):   15.7 ml 10.94 ml/m  RA Volume:   40.70 ml  28.35 ml/m LA Vol (A4C):   18.9 ml 13.17 ml/m LA Biplane Vol: 17.5 ml 12.19 ml/m  AORTIC VALVE AI PHT:      343 msec  AORTA Ao Root diam: 3.00 cm TRICUSPID VALVE TR Peak grad:   23.4 mmHg TR Vmax:        242.00 cm/s  SHUNTS Systemic Diam: 2.10 cm Marcina Millard MD Electronically signed by Marcina Millard MD Signature Date/Time: 03/11/2023/12:51:41 PM    Final    EEG adult  Result Date: 03/07/2023 Jefferson Fuel, MD     03/08/2023  3:10 PM Routine EEG Report Cody [name] is a 77 y.o. male with a  history of altered mental status who is undergoing an EEG to evaluate for seizures. Report: This EEG was acquired with electrodes placed according to the International 10-20 electrode system (including Fp1, Fp2, F3, F4, C3, C4, P3, P4, O1, O2, T3, T4, T5, T6, A1, A2, Fz, Cz, Pz). The following electrodes were missing or displaced: none. The occipital dominant rhythm was  4-6 Hz. This activity is reactive to stimulation. There are runs of faster activity most prominent over the left frontal region that are favored to be arousals. Drowsiness was manifested by background fragmentation; deeper stages of sleep were identified by K complexes and sleep spindles. There iws focal slowing over the left frontal region. There were occasional L occipital sharp waves. There were no electrographic seizures identified. Photic stimulation and hyperventilation were not performed. Impression and clinical correlation: This EEG was obtained while awake and asleep and is abnormal due to: - moderate diffuse slowing indicative of global cerebral dysfunction - left focal slowing indicative of superimposed focal cerebral dysfunction in that region - sharp waves in the left occipital region indicating increased epileptogenicity in that region No electrographic seizures were captured during this recording. Bing Neighbors, MD [facility] [phone] If 7pm- 7am, please page neurology on call as listed in AMION.   DG Chest Port 1 View  Result Date: 03/07/2023 CLINICAL DATA:  Status post right thoracentesis EXAM: PORTABLE CHEST 1 VIEW COMPARISON:  Chest radiograph dated 03/07/2023 FINDINGS: Lines/tubes: Partially imaged inferior approach catheter projects over the left upper quadrant. Chest: Improved lung aeration with persistent left-greater-than-right interstitial and patchy opacities. Pleura: Decreased right pleural effusion. Persistent moderate left pleural effusion. No pneumothorax. Heart/mediastinum: Similar enlarged  cardiomediastinal silhouette. Bones: No acute osseous abnormality. IMPRESSION: 1. Decreased right pleural effusion status post thoracentesis. No pneumothorax. 2. Persistent moderate left pleural effusion. 3. Improved lung aeration with persistent left-greater-than-right interstitial and patchy opacities. Electronically Signed   By: Agustin Cree M.D.   On: 03/07/2023 16:35   ECHOCARDIOGRAM LIMITED  Result Date: 03/07/2023    ECHOCARDIOGRAM LIMITED REPORT   Patient Name:   Cody [name]    Date of Exam: 03/07/2023 Medical Rec #:  [id number]  Height:       60.0 in Accession #:    [id number] Weight:       107.8 lb Date of Birth:  [birth year] BSA:          1.435 m Patient Age:    76 years   BP:           92/60 mmHg Patient Gender: M          HR:           87 bpm. Exam Location:  [facility] Procedure: Limited Echo, Color Doppler and Cardiac Doppler Indications:     Tamponade I31.4  History:         Patient has prior history of Echocardiogram examinations, most                  recent 03/06/2023. CHF; Risk Factors:Hypertension. Tobacco abuse.  Sonographer:     Cristela Blue Referring Phys:  [id number] CARALYN HUDSON Diagnosing Phys: Marcina Millard MD IMPRESSIONS  1. Left ventricular ejection fraction, by estimation, is 60 to 65%. The left ventricle has normal function. The left ventricle has no regional wall motion abnormalities. Indeterminate diastolic filling due to E-A fusion.  2. Right ventricular systolic function is normal. The right ventricular size is normal.  3. Moderate pericardial effusion.  4. The mitral valve is normal in structure. Mild mitral valve regurgitation. No evidence of mitral stenosis.  5. The aortic valve is normal in structure. Aortic valve regurgitation is mild. No aortic stenosis is present.  6. The inferior vena cava is normal in size with greater than 50% respiratory variability, suggesting right atrial pressure of 3 mmHg. FINDINGS  Left Ventricle: Left ventricular ejection fraction, by estimation, is 60  to  65%. The left ventricle has normal function. The left ventricle has no regional wall motion abnormalities. The left ventricular internal cavity size was normal in size. There is  no left ventricular hypertrophy. Indeterminate diastolic filling due to E-A fusion. Right Ventricle: The right ventricular size is normal. No increase in right ventricular wall thickness. Right ventricular systolic function is normal. Left Atrium: Left atrial size was normal in size. Right Atrium: Right atrial size was normal in size. Pericardium: Pericardial effusion much reduced after pericardiocenetesis. No right atrial or right ventricular diastolic collapse. No evidence for preicardial tamonade. A moderately sized pericardial effusion is present. Mitral Valve: The mitral valve is normal in structure. Mild mitral valve regurgitation. No evidence of mitral valve stenosis. Tricuspid Valve: The tricuspid valve is normal in structure. Tricuspid valve regurgitation is mild . No evidence of tricuspid stenosis. Aortic Valve: The aortic valve is normal in structure. Aortic valve regurgitation is mild. Aortic regurgitation PHT measures 436 msec. No aortic stenosis is present. Pulmonic Valve: The pulmonic valve was normal in structure. Pulmonic valve regurgitation is not visualized. No evidence of pulmonic stenosis. Aorta: The aortic root is normal in size and structure. Venous: The inferior vena cava is normal in size with greater than 50% respiratory variability, suggesting right atrial pressure of 3 mmHg. IAS/Shunts: No atrial level shunt detected by color flow Doppler. Additional Comments: Spectral Doppler performed. Color Doppler performed.  LEFT VENTRICLE PLAX 2D LVIDd:         4.40 cm LVIDs:         2.80 cm LV PW:         0.90 cm LV IVS:        0.80 cm LVOT diam:     2.00 cm LVOT Area:     3.14 cm  LEFT ATRIUM         Index LA diam:    2.80 cm 1.95 cm/m  AORTIC VALVE AI PHT:      436 msec  AORTA Ao Root diam: 3.00 cm MITRAL VALVE                 TRICUSPID VALVE MV Area (PHT): 4.99 cm     TR Peak grad:   24.0 mmHg MV Decel Time: 152 msec     TR Vmax:        245.00 cm/s MV E velocity: 126.00 cm/s                             SHUNTS                             Systemic Diam: 2.00 cm Marcina Millard MD Electronically signed by Marcina Millard MD Signature Date/Time: 03/07/2023/1:09:22 PM    Final    CT HEAD WO CONTRAST ( )  Result Date: 03/07/2023 CLINICAL DATA:  Neuro deficit, acute, stroke suspected. Altered mental status. Memory loss. EXAM: CT HEAD WITHOUT CONTRAST TECHNIQUE: Contiguous axial images were obtained from the base of the skull through the vertex without intravenous contrast. RADIATION DOSE REDUCTION: This exam was performed according to the departmental dose-optimization program which includes automated exposure control, adjustment of the mA and/or kV according to patient size and/or use of iterative reconstruction technique. COMPARISON:  Head CT and MRI 03/05/2023 FINDINGS: Brain: There is no evidence of an acute cortically based infarct, intracranial hemorrhage, midline shift, or extra-axial fluid collection. The ventricles are normal  in size. Mild-to-moderate vasogenic edema in the right frontal lobe, left occipital lobe, and right cerebellar hemisphere is unchanged and without significant associated mass effect. Mild to moderate hypodensities elsewhere in the cerebral white matter bilaterally are nonspecific but compatible with chronic small vessel ischemic disease. 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 Vascular: Calcified atherosclerosis at the skull base. No hyperdense vessel. Skull: No acute fracture or suspicious osseous lesion. Sinuses/Orbits: Minimal mucosal thickening in the paranasal sinuses. Clear mastoid air cells. Unremarkable orbits. Other: None. IMPRESSION: 1. Unchanged edema associated with known lesions in the right frontal lobe, left occipital lobe, and right cerebellum. No significant mass  effect. 2. No evidence of a new intracranial abnormality. Electronically Signed   By: Sebastian Ache M.D.   On: 03/07/2023 11:23   DG Chest Port 1 View  Result Date: 03/07/2023 CLINICAL DATA:  Pleural effusion. EXAM: PORTABLE CHEST 1 VIEW COMPARISON:  Chest radiograph and CT 03/05/2023 FINDINGS: The cardiac silhouette remains enlarged. Aortic atherosclerosis is noted. Veiling opacities in the lower lungs are consistent with persistent small to moderate pleural effusions as shown on CT. Bilateral interstitial and patchy airspace opacities have slightly worsened. No pneumothorax is identified. IMPRESSION: 1. Slight worsening of bilateral lung opacities which may reflect edema or pneumonia. 2. Persistent bilateral pleural effusions. Electronically Signed   By: Sebastian Ache M.D.   On: 03/07/2023 09:50   US Venous Img Lower Bilateral (DVT)  Result Date: 03/06/2023 CLINICAL DATA:  Lower extremity edema EXAM: BILATERAL LOWER EXTREMITY VENOUS DOPPLER ULTRASOUND TECHNIQUE: Gray-scale sonography with graded compression, as well as color Doppler and duplex ultrasound were performed to evaluate the lower extremity deep venous systems from the level of the common femoral vein and including the common femoral, femoral, profunda femoral, popliteal and calf veins including the posterior tibial, peroneal and gastrocnemius veins when visible. The superficial great saphenous vein was also interrogated. Spectral Doppler was utilized to evaluate flow at rest and with distal augmentation maneuvers in the common femoral, femoral and popliteal veins. COMPARISON:  None Available. FINDINGS: RIGHT LOWER EXTREMITY Common Femoral Vein: No evidence of thrombus. Normal compressibility, respiratory phasicity and response to augmentation. Saphenofemoral Junction: No evidence of thrombus. Normal compressibility and flow on color Doppler imaging. Profunda Femoral Vein: No evidence of thrombus. Normal compressibility and flow on color Doppler  imaging. Femoral Vein: No evidence of thrombus. Normal compressibility, respiratory phasicity and response to augmentation. Popliteal Vein: No evidence of thrombus. Normal compressibility, respiratory phasicity and response to augmentation. Calf Veins: No evidence of thrombus. Normal compressibility and flow on color Doppler imaging. Superficial Great Saphenous Vein: No evidence of thrombus. Normal compressibility. Venous Reflux:  None. Other Findings:  None. LEFT LOWER EXTREMITY Common Femoral Vein: No evidence of thrombus. Normal compressibility, respiratory phasicity and response to augmentation. Saphenofemoral Junction: No evidence of thrombus. Normal compressibility and flow on color Doppler imaging. Profunda Femoral Vein: No evidence of thrombus. Normal compressibility and flow on color Doppler imaging. Femoral Vein: No evidence of thrombus. Normal compressibility, respiratory phasicity and response to augmentation. Popliteal Vein: No evidence of thrombus. Normal compressibility, respiratory phasicity and response to augmentation. Calf Veins: No evidence of thrombus. Normal compressibility and flow on color Doppler imaging. Superficial Great Saphenous Vein: No evidence of thrombus. Normal compressibility. Venous Reflux:  None. Other Findings:  None. IMPRESSION: No evidence of deep venous thrombosis in either lower extremity. Electronically Signed   By: Alcide Clever M.D.   On: 03/06/2023 19:43   ECHOCARDIOGRAM COMPLETE  Result Date: 03/06/2023  ECHOCARDIOGRAM REPORT   Patient Name:  Cody [name]    Date of      03/06/2023                           Exam: Medical Rec #: [id number]  Height:      60.0 in Accession #:   [id number] Weight:      99.2 lb Date of Birth: 1946/03/21 BSA:         1.386 m Patient Age:   76 years   BP:          Not listed in chart/Not listed in chart                                        mmHg Patient        M          HR:          Not listed in chart bpm. Gender: Exam Location: [facility]  Procedure: 2D Echo, Cardiac Doppler and Color Doppler Indications:     Tamponade I31.4  History:         Patient has prior history of Echocardiogram examinations, most                  recent 03/05/2023. CHF; Risk Factors:Hypertension. Tobacco abuse.  Sonographer:     Cristela Blue Referring Phys:  [id number] [facility] MICHELLE TANG Diagnosing Phys: Marcina Millard MD IMPRESSIONS  1. Left ventricular ejection fraction, by estimation, is 60 to 65%. The left ventricle has normal function. The left ventricle has no regional wall motion abnormalities. Left ventricular diastolic parameters are indeterminate.  2. Right ventricular systolic function is normal. The right ventricular size is normal.  3. Successful pericardiocentesis with removal of 1100 cc serosanguinous pericardial fluid.. Findings are consistent with cardiac tamponade.  4. The mitral valve is normal in structure. No evidence of mitral valve regurgitation. No evidence of mitral stenosis.  5. The aortic valve is normal in structure. Aortic valve regurgitation is not visualized. No aortic stenosis is present.  6. The inferior vena cava is normal in size with greater than 50% respiratory variability, suggesting right atrial pressure of 3 mmHg. FINDINGS  Left Ventricle: Left ventricular ejection fraction, by estimation, is 60 to 65%. The left ventricle has normal function. The left ventricle has no regional wall motion abnormalities. The left ventricular internal cavity size was normal in size. There is  no left ventricular hypertrophy. Left ventricular diastolic parameters are indeterminate. Right Ventricle: The right ventricular size is normal. No increase in right ventricular wall thickness. Right ventricular systolic function is normal. Left Atrium: Left atrial size was normal in size. Right Atrium: Right atrial size was normal in size. Pericardium: Successful pericardiocentesis with removal of 1100 cc serosanguinous pericardial fluid. There is no evidence of  pericardial effusion. The pericardial effusion is circumferential. There is diastolic collapse of the right ventricular free wall. There is evidence of cardiac tamponade. Mitral Valve: The mitral valve is normal in structure. No evidence of mitral valve regurgitation. No evidence of mitral valve stenosis. Tricuspid Valve: The tricuspid valve is normal in structure. Tricuspid valve regurgitation is not demonstrated. No evidence of tricuspid stenosis. Aortic Valve: The aortic valve is normal in structure. Aortic valve regurgitation is not visualized. No aortic stenosis is present. Pulmonic Valve: The pulmonic valve was normal in structure. Pulmonic valve  regurgitation is not visualized. No evidence of pulmonic stenosis. Aorta: The aortic root is normal in size and structure. Venous: The inferior vena cava is normal in size with greater than 50% respiratory variability, suggesting right atrial pressure of 3 mmHg. IAS/Shunts: No atrial level shunt detected by color flow Doppler.  LEFT VENTRICLE PLAX 2D LVIDd:         3.85 cm LVIDs:         3.10 cm LV PW:         0.75 cm LV IVS:        1.10 cm  RIGHT VENTRICLE RV Basal diam:  3.40 cm RV Mid diam:    2.50 cm LEFT ATRIUM         Index       RIGHT ATRIUM           Index LA diam:    4.40 cm 3.18 cm/m  RA Area:     12.50 cm                                 RA Volume:   27.90 ml  20.14 ml/m Marcina Millard MD Electronically signed by Marcina Millard MD Signature Date/Time: 03/06/2023/5:12:27 PM    Final    CARDIAC CATHETERIZATION  Result Date: 03/06/2023 Successful pericardiocentesis with placement of pigtail catheter   ECHOCARDIOGRAM COMPLETE  Result Date: 03/06/2023    ECHOCARDIOGRAM REPORT   Patient Name:   Jaece Weddington    Date of Exam: 03/05/2023 Medical Rec #:  [id number]  Height:       57.0 in Accession #:    [id number] Weight:       100.0 lb Date of Birth:  [birth year] BSA:          1.340 m Patient Age:    76 years   BP:           123/76 mmHg Patient Gender: M           HR:           74 bpm. Exam Location:  [facility] Procedure: 2D Echo, Cardiac Doppler and Color Doppler Indications:     I31.3 Pericardial Effusion  History:         Patient has prior history of Echocardiogram examinations, most                  recent 07/27/2018. CHF; Risk Factors:Hypertension, Current                  Smoker and Alcohol Abuse.  Sonographer:     Daphine Deutscher RDCS Referring Phys:  [id number] Verdene Lennert Diagnosing Phys: Yvonne Kendall MD IMPRESSIONS  1. Left ventricular ejection fraction, by estimation, is 55 to 60%. The left ventricle has normal function. The left ventricle has no regional wall motion abnormalities. Left ventricular diastolic parameters are consistent with Grade I diastolic dysfunction (impaired relaxation).  2. Right ventricular systolic function is mildly reduced. The right ventricular size is normal. There is normal pulmonary artery systolic pressure.  3. Large pericardial effusion. The pericardial effusion is circumferential. Findings are consistent with cardiac tamponade.  4. The mitral valve is normal in structure. Mild mitral valve regurgitation.  5. Tricuspid valve regurgitation is mild to moderate.  6. The aortic valve is tricuspid. There is mild calcification of the aortic valve. There is mild thickening of the aortic valve. Aortic valve regurgitation is mild to moderate.  7. The inferior  vena cava is dilated in size with <50% respiratory variability, suggesting right atrial pressure of 15 mmHg. Conclusion(s)/Recommendation(s): Findings were conveyed to Dr. Gillis Santa at 8:02 AM on 03/06/2023 by Dr. Cristal [place]. FINDINGS  Left Ventricle: Left ventricular ejection fraction, by estimation, is 55 to 60%. The left ventricle has normal function. The left ventricle has no regional wall motion abnormalities. The left ventricular internal cavity size was normal in size. There is  no left ventricular hypertrophy. Left ventricular diastolic parameters are consistent  with Grade I diastolic dysfunction (impaired relaxation). Right Ventricle: The right ventricular size is normal. No increase in right ventricular wall thickness. Right ventricular systolic function is mildly reduced. There is normal pulmonary artery systolic pressure. The tricuspid regurgitant velocity is 1.71 m/s, and with an assumed right atrial pressure of 15 mmHg, the estimated right ventricular systolic pressure is 26.7 mmHg. Left Atrium: Left atrial size was normal in size. Right Atrium: Right atrial size was normal in size. Pericardium: A large pericardial effusion is present. The pericardial effusion is circumferential. The pericardial effusion appears to contain fibrous material. There is diastolic collapse of the right ventricular free wall and excessive respiratory variation in the mitral valve spectral Doppler velocities. There is evidence of cardiac tamponade. Mitral Valve: The mitral valve is normal in structure. There is mild thickening of the mitral valve leaflet(s). Mild mitral valve regurgitation. Tricuspid Valve: The tricuspid valve is normal in structure. Tricuspid valve regurgitation is mild to moderate. Aortic Valve: The aortic valve is tricuspid. There is mild calcification of the aortic valve. There is mild thickening of the aortic valve. Aortic valve regurgitation is mild to moderate. Aortic regurgitation PHT measures 507 msec. Aortic valve mean gradient measures 2.5 mmHg. Aortic valve peak gradient measures 4.3 mmHg. Aortic valve area, by VTI measures 2.84 cm. Pulmonic Valve: The pulmonic valve was normal in structure. Pulmonic valve regurgitation is trivial. No evidence of pulmonic stenosis. Aorta: The aortic root and ascending aorta are structurally normal, with no evidence of dilitation. Pulmonary Artery: The pulmonary artery is of normal size. Venous: The inferior vena cava is dilated in size with less than 50% respiratory variability, suggesting right atrial pressure of 15 mmHg.  IAS/Shunts: The interatrial septum was not well visualized.  LEFT VENTRICLE PLAX 2D LVIDd:         3.50 cm   Diastology LVIDs:         2.50 cm   LV e' medial:    4.46 cm/s LV PW:         0.90 cm   LV E/e' medial:  9.7 LV IVS:        0.80 cm   LV e' lateral:   6.31 cm/s LVOT diam:     2.00 cm   LV E/e' lateral: 6.9 LV SV:         46 LV SV Index:   34 LVOT Area:     3.14 cm  RIGHT VENTRICLE RV Basal diam:  3.10 cm RV S prime:     7.29 cm/s TAPSE (M-mode): 1.3 cm LEFT ATRIUM             Index        RIGHT ATRIUM           Index LA diam:        3.10 cm 2.31 cm/m   RA Area:     10.70 cm LA Vol (A2C):   37.7 ml 28.14 ml/m  RA Volume:   27.40 ml  20.45  ml/m LA Vol (A4C):   25.3 ml 18.88 ml/m LA Biplane Vol: 33.9 ml 25.30 ml/m  AORTIC VALVE AV Area (Vmax):    2.86 cm AV Area (Vmean):   2.60 cm AV Area (VTI):     2.84 cm AV Vmax:           103.69 cm/s AV Vmean:          74.930 cm/s AV VTI:            0.161 m AV Peak Grad:      4.3 mmHg AV Mean Grad:      2.5 mmHg LVOT Vmax:         94.33 cm/s LVOT Vmean:        62.000 cm/s LVOT VTI:          0.145 m LVOT/AV VTI ratio: 0.90 AI PHT:            507 msec  AORTA Ao Root diam: 3.20 cm Ao Asc diam:  3.50 cm MITRAL VALVE               TRICUSPID VALVE MV Area (PHT): 4.28 cm    TR Peak grad:   11.7 mmHg MV Decel Time: 177 msec    TR Vmax:        171.00 cm/s MV E velocity: 43.23 cm/s MV A velocity: 63.73 cm/s  SHUNTS MV E/A ratio:  0.68        Systemic VTI:  0.15 m                            Systemic Diam: 2.00 cm Yvonne Kendall MD Electronically signed by Yvonne Kendall MD Signature Date/Time: 03/06/2023/8:13:55 AM    Final    MR Brain W and Wo Contrast  Result Date: 03/05/2023 CLINICAL DATA:  Mental status change, unknown cause EXAM: MRI HEAD WITHOUT AND WITH CONTRAST TECHNIQUE: Multiplanar, multiecho pulse sequences of the brain and surrounding structures were obtained without and with intravenous contrast. CONTRAST:  5mL GADAVIST GADOBUTROL 1 MMOL/ML IV SOLN  COMPARISON:  Same day CT head FINDINGS: Brain: Negative for an acute infarct. No hydrocephalus. No extra-axial fluid collection. Small microhemorrhage in the right lentiform nucleus. Chronic left cerebellar infarct. There is sequela mild-to-moderate chronic microvascular ischemic change. There are multiple lesions, including- -1.0 x 0.7 cm contrast-enhancing lesion in the right frontal lobe with surrounding vasogenic edema (series 18, image 108) - 0.8 x 0.7 cm contrast-enhancing lesions in in the left occipital lobe with surrounding vasogenic edema (series 18, image 69). -1.2 x 1.1 cm contrast-enhancing lesion in the superior right cerebellum mild surrounding vasogenic edema (series 18, image 50) -0.6 x 0.4 cm dural-based contrast-enhancing lesion along the left frontal convexity (series 93, image 176). Vascular: Normal flow voids. Skull and upper cervical spine: Normal marrow signal. Sinuses/Orbits: No middle ear or mastoid effusion. Mild mucosal thickening bilateral ethmoid air cells. Orbits are unremarkable. Other: None. IMPRESSION: 1. Multiple contrast-enhancing lesions in the right frontal lobe, left occipital lobe, and superior right cerebellum with surrounding vasogenic edema. 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 2. Small dural-based contrast-enhancing lesion along the left frontal convexity could represent a small meningimoa, but metastatic disease is not excluded. Electronically Signed   By: Lorenza Cambridge M.D.   On: 03/05/2023 16:48   CT CHEST ABDOMEN PELVIS W CONTRAST  Result Date: 03/05/2023 CLINICAL DATA:  Brain mass. Assess for occult malignancy. * Tracking Code: BO * EXAM: CT CHEST,  ABDOMEN, AND PELVIS WITH CONTRAST TECHNIQUE: Multidetector CT imaging of the chest, abdomen and pelvis was performed following the standard protocol during bolus administration of intravenous contrast. RADIATION DOSE REDUCTION: This exam was performed according to the departmental dose-optimization  program which includes automated exposure control, adjustment of the mA and/or kV according to patient size and/or use of iterative reconstruction technique. CONTRAST:  75mL OMNIPAQUE IOHEXOL 300 MG/ML  SOLN COMPARISON:  Chest CT without contrast 04/28/2015. X-ray chest earlier 03/05/2023 FINDINGS: CT CHEST FINDINGS Cardiovascular: Very large pericardial effusion. The heart itself is nonenlarged. Significant coronary artery calcifications. The thoracic aorta has a normal course and caliber with mild atherosclerotic calcified plaque. Mediastinum/Nodes: Normal caliber thoracic esophagus. Preserved thyroid gland. No specific abnormal lymph node enlargement seen including in the axillary regions, hilum. Prominent right paratracheal node on series 2, image 18 measures 18 by 7 mm. This could be 2 adjacent nodes. Subcarinal node series 2, image 29 measures 2.7 by 1.1 cm, mildly enlarged. Lungs/Pleura: Moderate right and small left effusion. There are some loculated components on the left with fluid tracking along the interlobar fissure. Adjacent parenchymal opacities. Atelectasis versus infiltrate. There is a opacity specifically in the lingula as well. Scattered ground-glass elsewhere in the visualized lungs. No pneumothorax. No obvious mass. Significant breathing motion throughout the examination. A subtle mass lesion could be obscured by the lung opacity in the effusions. Follow up study could be performed after improvement of the effusions. Musculoskeletal: Mild degenerative changes along the spine. CT ABDOMEN PELVIS FINDINGS Hepatobiliary: Contrast exam in the abdomen is more in the late arterial phase and portal venous phase. Patent portal vein. Gallbladder is present. Small cysts seen in segment 4 of the liver. Pancreas: Unremarkable. No pancreatic ductal dilatation or surrounding inflammatory changes. Spleen: Normal in size without focal abnormality. Adrenals/Urinary Tract: Slight nonspecific thickening of the  adrenal glands but unchanged from previous. No enhancing renal mass or collecting system dilatation. There are some small low-attenuation lesions in the left kidney which are too small to completely characterize but likely benign cysts, Bosniak 2 lesions. No specific imaging follow-up. The ureters have normal course and caliber extending down to the bladder. Diffuse wall thickening of the urinary bladder. Stomach/Bowel: On this non oral contrast exam, large bowel has a normal caliber. Redundant course of the sigmoid colon. There are some areas of mild wall thickening along loops of bowel, nonspecific. The stomach is mildly distended with fluid. Mild fold thickening suggested small bowel is nondilated. Vascular/Lymphatic: Normal caliber aorta and IVC with moderate calcified plaque along the aorta and iliac vessels. There are several enlarged nodes identified in the retroperitoneum and upper abdomen. Example aortocaval node posteriorly on series 2, image 64 measures 17 by 13 mm. No next of the celiac series 2, image 59 measures 16 by 14 mm. Left para-node anteriorly on series 2, image 68 measures 17 by 8 mm. A few small retrocrural nodes are also identified. Reproductive: Enlarged heterogeneous prostate. Please correlate with patient's PSA. Other: Anasarca.  Mesenteric haziness.  Trace ascites.  No free air Musculoskeletal: Mild degenerative changes along the spine. IMPRESSION: Large pericardial effusion. Please correlate with and new symptoms including for tamponade. Moderate right and small left effusion with some loculated components on the left. Adjacent parenchymal opacities. Atelectasis versus infiltrate. Recommend follow up after clearance of the effusions. Few borderline enlarged lymph nodes identified including mediastinum, upper retroperitoneum. Enlarged prostate with mass effect along the bladder with bladder wall thickening. Please correlate with patient's PSA. No bowel obstruction  or free air. There is  scattered mesenteric haziness with trace ascites. There are some small areas of wall thickening along the colon but this could simply relate to the other signs of edema. Electronically Signed   By: Karen Kays M.D.   On: 03/05/2023 15:03   DG Chest Port 1 View  Result Date: 03/05/2023 CLINICAL DATA:  Shortness of breath EXAM: PORTABLE CHEST 1 VIEW COMPARISON:  07/27/2018 FINDINGS: Cardiac silhouette is enlarged with diffuse interstitial opacities throughout both lungs compatible with edema related to CHF. Basilar atelectasis noted, worse on the left. Difficult to exclude small effusions. Also left lower lobe retrocardiac opacity obscures the left hemidiaphragm concerning for possible superimposed pneumonia. No pneumothorax. Trachea midline. Aorta atherosclerotic. No acute osseous finding. IMPRESSION: 1. Cardiomegaly with diffuse interstitial edema pattern. 2. Left lower lobe airspace consolidation concerning for superimposed pneumonia. 3. Suspect small effusions. Electronically Signed   By: Judie Petit.  Shick M.D.   On: 03/05/2023 13:20   CT Head Wo Contrast  Result Date: 03/05/2023 CLINICAL DATA:  Mental status change of unknown cause. Memory disturbance. EXAM: CT HEAD WITHOUT CONTRAST TECHNIQUE: Contiguous axial images were obtained from the base of the skull through the vertex without intravenous contrast. RADIATION DOSE REDUCTION: This exam was performed according to the departmental dose-optimization program which includes automated exposure control, adjustment of the mA and/or kV according to patient size and/or use of iterative reconstruction technique. COMPARISON:  07/27/2018 FINDINGS: Brain: No focal abnormality affects the brainstem. Question mild edema and mass effect in the inferior right cerebellum. 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 There is abnormal vasogenic edema within the right posterior frontal region worrisome for an underlying mass lesion. In the left hemisphere, there is  abnormal vasogenic edema in the occipital lobe worrisome for the presence of an occult mass lesion. No visible hemorrhage. Hydrocephalus or extra-axial collection. Vascular: There is atherosclerotic calcification of the major vessels at the base of the brain. Skull: No calvarial abnormality. Sinuses/Orbits: Clear/normal Other: None IMPRESSION: 1. Abnormal vasogenic edema in the right posterior frontal region and in the left occipital lobe worrisome for the presence of occult mass lesions. Question mild edema and mass effect in the inferior right cerebellum. No visible hemorrhage. MRI with and without contrast is recommended for further evaluation. 2. 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 3. Atherosclerotic calcification of the major vessels at the base of the brain. Electronically Signed   By: Paulina Fusi M.D.   On: 03/05/2023 12:52

## 2023-04-02 NOTE — Telephone Encounter (Signed)
Oral Oncology Pharmacist Encounter  Received new prescription for Retevmo (selpercatinib) for the treatment of metastatic NSCLC, RET mutation positive, planned duration until disease progression or unacceptable drug toxicity.  Labs from *** assessed, ***. Prescription dose and frequency assessed.   Current medication list in Epic reviewed, *** DDIs with *** identified:  Evaluated chart and *** patient barriers to medication adherence identified.   Prescription has been e-scribed to the [facility] for benefits analysis and approval.  Oral Oncology Clinic will continue to follow for insurance authorization, copayment issues, initial counseling and start date.  Patient agreed to treatment on *** per MD documentation.  Remi Haggard, PharmD, BCPS, BCOP, CPP Hematology/Oncology Clinical Pharmacist Practitioner [place]/DB/AP Oral Chemotherapy Navigation Clinic [phone]  04/02/2023 4:18 PM

## 2023-04-02 NOTE — Assessment & Plan Note (Signed)
Secondary to chronic alcohol use. improved Encourage patient's alcohol cessation effort.

## 2023-04-02 NOTE — Assessment & Plan Note (Signed)
Patient has establish care with radiation oncology for palliative radiation. Continue dexamethasone, due to side effects [ insomnia], recommend patient to decrease to 2mg  daily

## 2023-04-03 ENCOUNTER — Other Ambulatory Visit (HOSPITAL_COMMUNITY): Payer: Self-pay

## 2023-04-03 ENCOUNTER — Telehealth: Payer: Self-pay

## 2023-04-03 ENCOUNTER — Other Ambulatory Visit: Payer: Self-pay

## 2023-04-03 ENCOUNTER — Ambulatory Visit
Admission: RE | Admit: 2023-04-03 | Discharge: 2023-04-03 | Disposition: A | Discharge status: Home / Self Care | Payer: Medicare Other | Source: Ambulatory Visit | Attending: Radiation Oncology | Admitting: Radiation Oncology

## 2023-04-03 DIAGNOSIS — Z510 Encounter for antineoplastic radiation therapy: Secondary | ICD-10-CM | POA: Diagnosis not present

## 2023-04-03 LAB — RAD ONC ARIA SESSION SUMMARY
Course Elapsed Days: 14
Plan Fractions Treated to Date: 10
Plan Prescribed Dose Per Fraction: 3 Gy
Plan Total Fractions Prescribed: 10
Plan Total Prescribed Dose: 30 Gy
Reference Point Dosage Given to Date: 30 Gy
Reference Point Session Dosage Given: 3 Gy
Session Number: 10

## 2023-04-03 MED ORDER — SELPERCATINIB 40 MG PO CAPS
120.0000 mg | ORAL_CAPSULE | Freq: Two times a day (BID) | ORAL | 0 refills | Status: DC
Start: 2023-04-03 — End: 2023-05-03
  Filled 2023-04-03 (×2): qty 180, 30d supply, fill #0

## 2023-04-03 NOTE — Telephone Encounter (Signed)
Patient successfully OnBoarded and scheduled to receive drug education from pharmacist at next appointment. Medication scheduled to be shipped on 04/04/23 for delivery on 04/05/23 from [facility] to patient's address. Patient knows to NOT take until given the okay from MD and pharmacist at next OV. Patient also knows to call me at [phone] with any questions or concerns regarding receiving medication or if there is any unexpected change in co-pay.    Ardeen Fillers, CPhT Oncology Pharmacy Patient Advocate  [facility]  [phone] (phone) [phone] (fax) 04/03/2023 1:21 PM

## 2023-04-03 NOTE — Telephone Encounter (Signed)
Oral Oncology Patient Advocate Encounter  Prior Authorization for Suezanne Cheshire has been approved.    PA# [id number]  Effective dates: 04/03/23 through 10/03/23  Patients co-pay is $4.60.    Ardeen Fillers, CPhT Oncology Pharmacy Patient Advocate  [facility]  [phone] (phone) [phone] (fax) 04/03/2023 9:09 AM

## 2023-04-03 NOTE — Telephone Encounter (Signed)
Oral Oncology Patient Advocate Encounter  New authorization   Received notification that prior authorization for Retevmo is required.   PA submitted on 04/03/23  Key [id number]  Status is pending     Ardeen Fillers, CPhT Oncology Pharmacy Patient Advocate  [facility]  [phone] (phone) [phone] (fax) 04/03/2023 8:07 AM

## 2023-04-03 NOTE — Telephone Encounter (Signed)
Dr. Cathie Hoops spoke with patient's cardiology office who okayed the stopping of his amiodarone due to the DDI with selpercatinib.   Spoke with patient's daughter Irving Burton who manages the medication for her father. She will have him stop the amiodarone and cochicine starting tomorrow 04/04/23  Patient to RTC on 04/11/23 for repeat ECG and the okay to begin treatment. Irving Burton knows not the start the selpercatinib until given the okay by Dr. Cathie Hoops.

## 2023-04-03 NOTE — Telephone Encounter (Signed)
Received approval for Prior Authorization of Retevmo. One of the clinical questions, "Is the disease documented RET fusion positive?", was answered incorrectly as "yes." I called [employer] Part D Prior Authorization Department to let the representative know I answered that question incorrectly and Representative Corrie Dandy informed me that the approval was final and that they were unable to correct mistake, even if that resulted in a denial.    Ardeen Fillers, CPhT Oncology Pharmacy Patient Advocate  [facility]  [phone] (phone) [phone] (fax) 04/03/2023 9:08 AM

## 2023-04-04 ENCOUNTER — Other Ambulatory Visit (HOSPITAL_COMMUNITY): Payer: Self-pay

## 2023-04-04 ENCOUNTER — Other Ambulatory Visit: Payer: Self-pay

## 2023-04-08 ENCOUNTER — Other Ambulatory Visit: Payer: Medicare Other

## 2023-04-08 ENCOUNTER — Telehealth: Payer: Self-pay | Admitting: Nurse Practitioner

## 2023-04-08 ENCOUNTER — Ambulatory Visit: Payer: Medicare Other | Admitting: Nurse Practitioner

## 2023-04-08 DIAGNOSIS — I10 Essential (primary) hypertension: Secondary | ICD-10-CM

## 2023-04-08 DIAGNOSIS — Z1329 Encounter for screening for other suspected endocrine disorder: Secondary | ICD-10-CM

## 2023-04-08 DIAGNOSIS — Z131 Encounter for screening for diabetes mellitus: Secondary | ICD-10-CM

## 2023-04-08 DIAGNOSIS — Z13220 Encounter for screening for lipoid disorders: Secondary | ICD-10-CM

## 2023-04-08 NOTE — Telephone Encounter (Signed)
Orders entered

## 2023-04-09 ENCOUNTER — Ambulatory Visit (INDEPENDENT_AMBULATORY_CARE_PROVIDER_SITE_OTHER): Payer: Medicare Other

## 2023-04-09 DIAGNOSIS — I361 Nonrheumatic tricuspid (valve) insufficiency: Secondary | ICD-10-CM | POA: Diagnosis not present

## 2023-04-09 DIAGNOSIS — I3139 Other pericardial effusion (noninflammatory): Secondary | ICD-10-CM

## 2023-04-09 DIAGNOSIS — I340 Nonrheumatic mitral (valve) insufficiency: Secondary | ICD-10-CM

## 2023-04-09 DIAGNOSIS — I351 Nonrheumatic aortic (valve) insufficiency: Secondary | ICD-10-CM | POA: Diagnosis not present

## 2023-04-09 LAB — CMP14+EGFR
ALT: 40 IU/L (ref 0–44)
AST: 29 IU/L (ref 0–40)
Albumin/Globulin Ratio: 1.1
Albumin: 3.3 g/dL — ABNORMAL LOW (ref 3.8–4.8)
Alkaline Phosphatase: 98 IU/L (ref 44–121)
BUN/Creatinine Ratio: 31 — ABNORMAL HIGH (ref 10–24)
BUN: 21 mg/dL (ref 8–27)
Bilirubin Total: <0.2 mg/dL (ref 0.0–1.2)
CO2: 23 mmol/L (ref 20–29)
Calcium: 9 mg/dL (ref 8.6–10.2)
Chloride: 98 mmol/L (ref 96–106)
Creatinine, Ser: 0.68 mg/dL — ABNORMAL LOW (ref 0.76–1.27)
Globulin, Total: 3.1 g/dL (ref 1.5–4.5)
Glucose: 78 mg/dL (ref 70–99)
Potassium: 4.6 mmol/L (ref 3.5–5.2)
Sodium: 133 mmol/L — ABNORMAL LOW (ref 134–144)
Total Protein: 6.4 g/dL (ref 6.0–8.5)
eGFR: 97 mL/min/{1.73_m2} (ref >59)

## 2023-04-09 LAB — LIPID PANEL
Chol/HDL Ratio: 2.4 ratio (ref 0.0–5.0)
Cholesterol, Total: 182 mg/dL (ref 100–199)
HDL: 76 mg/dL (ref >39)
LDL Chol Calc (NIH): 91 mg/dL (ref 0–99)
Triglycerides: 83 mg/dL (ref 0–149)
VLDL Cholesterol Cal: 15 mg/dL (ref 5–40)

## 2023-04-09 LAB — TSH: TSH: 1.64 u[IU]/mL (ref 0.450–4.500)

## 2023-04-11 ENCOUNTER — Encounter: Payer: Self-pay | Admitting: Oncology

## 2023-04-11 ENCOUNTER — Inpatient Hospital Stay (HOSPITAL_BASED_OUTPATIENT_CLINIC_OR_DEPARTMENT_OTHER): Payer: Medicare Other | Admitting: Oncology

## 2023-04-11 ENCOUNTER — Inpatient Hospital Stay: Payer: Medicare Other | Admitting: Pharmacist

## 2023-04-11 VITALS — BP 107/68 | HR 107 | Temp 97.9°F | Resp 18 | Wt 101.0 lb

## 2023-04-11 DIAGNOSIS — G9389 Other specified disorders of brain: Secondary | ICD-10-CM | POA: Diagnosis not present

## 2023-04-11 DIAGNOSIS — C778 Secondary and unspecified malignant neoplasm of lymph nodes of multiple regions: Secondary | ICD-10-CM | POA: Insufficient documentation

## 2023-04-11 DIAGNOSIS — E871 Hypo-osmolality and hyponatremia: Secondary | ICD-10-CM | POA: Insufficient documentation

## 2023-04-11 DIAGNOSIS — I3131 Malignant pericardial effusion in diseases classified elsewhere: Secondary | ICD-10-CM | POA: Diagnosis not present

## 2023-04-11 DIAGNOSIS — G939 Disorder of brain, unspecified: Secondary | ICD-10-CM | POA: Insufficient documentation

## 2023-04-11 DIAGNOSIS — Z5111 Encounter for antineoplastic chemotherapy: Secondary | ICD-10-CM | POA: Diagnosis not present

## 2023-04-11 DIAGNOSIS — Z87891 Personal history of nicotine dependence: Secondary | ICD-10-CM | POA: Diagnosis not present

## 2023-04-11 DIAGNOSIS — C3490 Malignant neoplasm of unspecified part of unspecified bronchus or lung: Secondary | ICD-10-CM | POA: Insufficient documentation

## 2023-04-11 DIAGNOSIS — J90 Pleural effusion, not elsewhere classified: Secondary | ICD-10-CM | POA: Diagnosis not present

## 2023-04-11 DIAGNOSIS — R748 Abnormal levels of other serum enzymes: Secondary | ICD-10-CM | POA: Diagnosis not present

## 2023-04-11 DIAGNOSIS — C7931 Secondary malignant neoplasm of brain: Secondary | ICD-10-CM | POA: Insufficient documentation

## 2023-04-11 DIAGNOSIS — F1090 Alcohol use, unspecified, uncomplicated: Secondary | ICD-10-CM | POA: Insufficient documentation

## 2023-04-11 DIAGNOSIS — R7989 Other specified abnormal findings of blood chemistry: Secondary | ICD-10-CM | POA: Diagnosis not present

## 2023-04-11 MED ORDER — PROCHLORPERAZINE MALEATE 10 MG PO TABS: 10.0000 mg | ORAL_TABLET | Freq: Four times a day (QID) | ORAL | 0 refills | Status: DC | PRN

## 2023-04-11 MED ORDER — LOPERAMIDE HCL 2 MG PO CAPS: 2.0000 mg | ORAL_CAPSULE | ORAL | 2 refills | Status: DC

## 2023-04-11 MED ORDER — DEXAMETHASONE 1 MG PO TABS: 1.0000 mg | ORAL_TABLET | Freq: Every day | ORAL | 0 refills | Status: DC

## 2023-04-11 NOTE — Assessment & Plan Note (Addendum)
S/p palliative brain radiation. recommend patient to continue another weekly of Dexamethasone 2mg  daily, and then decrease to 1mg  daily for 2 weeks then stop.

## 2023-04-11 NOTE — Assessment & Plan Note (Addendum)
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 Prognosis is poor. Clinically patient has improved. He has RET mutation. Recommend  Retevmo 120mg  BID.  Potential side effects were reviewed with patient and daughter they agree with the plan.  EKG showed normal QT.  Proceed with Retevmo

## 2023-04-11 NOTE — Assessment & Plan Note (Signed)
Chemotherapy plan as listed above 

## 2023-04-11 NOTE — Progress Notes (Signed)
Oral Chemotherapy Clinic [facility]  [phone] [phone]  Patient Care Team: Orson Eva, NP as PCP - General (Nurse Practitioner) Glory Buff, RN as Oncology Nurse Navigator   Name of the patient: Cody [name]  [id number]  04/17/1946   Date of visit: 04/11/23  HPI: Patient is a 77 y.o. 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 Planned treatment with selpercatinib. Patient to start selpercatinib tomorrow 04/11/23.  Reason for Consult: Selpercatinib oral chemotherapy education.   PAST MEDICAL HISTORY: Past Medical History:  Diagnosis Date   Alcohol dependence (HCC)    CHF (congestive heart failure) (HCC)    Hypertension    Tobacco abuse     HEMATOLOGY/ONCOLOGY HISTORY:  Oncology History  Metastatic non-small cell lung cancer (HCC)  03/05/2023 Imaging   CT chest abdomen pelvis with contrast showed large pericardial effusion. Moderate right and small left effusion with some loculated components on the left. Adjacent parenchymal opacities. Atelectasis versus infiltrates. Few borderline enlarged lymph nodes including mediastinum, upper retroperitoneum. Enlarged prostate with mass effect along the bladder with bladder wall thickening. No bowel obstruction or free air. Small area of wall thickening along the colon.    03/05/2023 Imaging   CT head showed abdominal vasogenic edema in the right posterior frontal region and in the left occipital lobe worrisome for presence of occult mass lesions. Questionable mild edema and mass effect in the inferior right cerebellum. No visible hemorrhage. 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 Atherosclerotic calcification of the major vessels at the base of brain.    03/05/2023 Imaging   MRI brain with and without contrast showed multiple contrast-enhancing lesions in the right frontal lobe, left occipital lobe, superior right cerebellum with surrounding vasogenic edema. Worrisome for  intracranial metastatic disease. Small dural based contrast-enhancing lesions along the left frontal convexity could represent a small hemangioma. Metastatic disease is not excluded.    03/05/2023 - 03/12/2023 Hospital Admission   Patient presented emergency room due to confusion, shortness of breath. CT head and MRI showed multiple contrast-enhancing lesions with vasogenic edema CT chest abdomen pelvis showed large pericardial effusion, pleural effusion, mediastinal/upper retroperitoneum lymphadenopathy Patient was treated with Decadron, neurosurgery recommended no intervention. Status post pericardiocentesis on 03/07/2023.  Pigtail catheter was initially placed and later on removed.  TTE showed no pericardial effusion. Bilateral pleural effusion, status post thoracentesis.  Cytology positive. 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  Patient was treated with 3% saline clinic sodium improved. Gout with uric acid of 9.7.  Started on colchicine. Alcohol use disorder, status post CIWA protocol with Ativan as needed.   03/07/2023 Cancer Staging   Staging form: Lung, AJCC 8th Edition - Clinical stage from 03/07/2023: Stage IV (cTX, cN2, cM1) - Signed by Rickard Patience, MD on 03/13/2023 Stage prefix: Initial diagnosis   03/07/2023 Echocardiogram   1. Left ventricular ejection fraction, by estimation, is 60 to 65%. The left ventricle has normal function. The left ventricle has no regional  wall motion abnormalities. Indeterminate diastolic filling due to E-A fusion.   2. Right ventricular systolic function is normal. The right ventricular size is normal.   3. Moderate pericardial effusion.   4. The mitral valve is normal in structure. Mild mitral valve regurgitation. No evidence of mitral stenosis.   5. The aortic valve is normal in structure. Aortic valve regurgitation is mild. No aortic stenosis is present.   6. The inferior vena cava is normal in size with greater than 50%  respiratory  variability, suggesting right atrial pressure of 3 mmHg.     03/07/2023 Procedure   -5/9 S/p pericardiocentesis with removal of 1.1 L of bloody fluid.  Pigtail catheter left in place.  -Cytology came back positive for malignancy.  Metastatic adenocarcinoma, compatible with pulmonary adenocarcinoma   -5/9 s/p  thoracentesis, cytology is negative.    03/08/2023 Initial Diagnosis   Metastatic non-small cell lung cancer (HCC)  [facility] NGS showed RET pG533C missense variant [VAF33.8%] NF1 frame shift, TP53, NFE2L2, CDKN2A, CKKN2B TMB 15.8, MSI stable.   03/11/2023 Echocardiogram   1. Left ventricular ejection fraction, by estimation, is 60 to 65%. The left ventricle has normal function.   2. Mild to moderate mitral valve regurgitation.   3. Aortic valve regurgitation is mild to moderate    03/19/2023 - 04/03/2023 Radiation Therapy   Whole Brain Radiation.    03/23/2023 Imaging   PET scan showed 13 mm irregular left lower lobe nodule, likely corresponding to the patient's known primary bronchogenic carcinoma.   Thoracic and upper abdominal nodal metastases, as described above.   Irregular appearance of the posterior gastric antrum, with associated hypermetabolism, as above. Appearance favors gastritis versus gastric ulcer. Consider upper endoscopy for further evaluation.   Prior pericardial effusion and bilateral pleural effusions have resolved.     ALLERGIES:  is allergic to shellfish allergy.  MEDICATIONS:  Current Outpatient Medications  Medication Sig Dispense Refill   allopurinol (ZYLOPRIM) 100 MG tablet Take 1 tablet (100 mg total) by mouth daily. 30 tablet 11   dexamethasone (DECADRON) 1 MG tablet Take 1 tablet (1 mg total) by mouth daily with breakfast. 14 tablet 0   finasteride (PROSCAR) 5 MG tablet Take 1 tablet (5 mg total) by mouth daily. 90 tablet 3   fluconazole (DIFLUCAN) 100 MG tablet Take 1 tablet (100 mg total) by mouth daily. 7 tablet 0   folic acid  (FOLVITE) 1 MG tablet Take 1 tablet (1 mg total) by mouth daily. 30 tablet 2   iron polysaccharides (NIFEREX) 150 MG capsule Take 1 capsule (150 mg total) by mouth daily. 30 capsule 2   levETIRAcetam (KEPPRA) 500 MG tablet Take 1 tablet (500 mg total) by mouth 2 (two) times daily. 60 tablet 5   loperamide (IMODIUM) 2 MG capsule Take 1 capsule (2 mg total) by mouth See admin instructions. Initial: 4 mg,the 2 mg every 2 hours (4 mg every 4 hours at night)  maximum: 16 mg/day 60 capsule 2   Multiple Vitamin (MULTIVITAMIN WITH MINERALS) TABS tablet Take 1 tablet by mouth daily. 30 tablet 2   polyethylene glycol (MIRALAX / GLYCOLAX) 17 g packet Take 17 g by mouth daily as needed for mild constipation or moderate constipation. 14 each 0   prochlorperazine (COMPAZINE) 10 MG tablet Take 1 tablet (10 mg total) by mouth every 6 (six) hours as needed for nausea or vomiting. 60 tablet 0   selpercatinib (RETEVMO) 40 MG capsule Take 3 capsules (120 mg total) by mouth 2 (two) times daily. 180 capsule 0   tamsulosin (FLOMAX) 0.4 MG CAPS capsule Take 1 capsule (0.4 mg total) by mouth daily. 90 capsule 3   thiamine (VITAMIN B-1) 100 MG tablet Take 1 tablet (100 mg total) by mouth daily. 30 tablet 2   Vitamin D, Ergocalciferol, (DRISDOL) 1.25 MG (50000 UNIT) CAPS capsule Take 1 capsule (50,000 Units total) by mouth every 7 (seven) days. 12 capsule 0   No current facility-administered medications for this visit.    VITAL SIGNS: There were  no vitals taken for this visit. There were no vitals filed for this visit.  Estimated body mass index is 21.11 kg/m as calculated from the following:   Height as of 03/26/23: 4\' 10"  (1.473 m).   Weight as of an earlier encounter on 04/11/23: 45.8 kg (101 lb).  LABS: CBC:    Component Value Date/Time   WBC 11.8 (H) 04/02/2023 1555   WBC 12.6 (H) 03/12/2023 0426   HGB 12.3 (L) 04/02/2023 1555   HGB 14.6 12/19/2022 0841   HCT 38.7 (L) 04/02/2023 1555   HCT 43.8 12/19/2022  0841   PLT 261 04/02/2023 1555   PLT 292 12/19/2022 0841   MCV 96.3 04/02/2023 1555   MCV 99 (H) 12/19/2022 0841   NEUTROABS 8.8 (H) 04/02/2023 1555   NEUTROABS 3.9 12/19/2022 0841   LYMPHSABS 1.3 04/02/2023 1555   LYMPHSABS 1.7 12/19/2022 0841   MONOABS 1.0 04/02/2023 1555   EOSABS 0.2 04/02/2023 1555   EOSABS 1.3 (H) 12/19/2022 0841   BASOSABS 0.1 04/02/2023 1555   BASOSABS 0.0 12/19/2022 0841   Comprehensive Metabolic Panel:    Component Value Date/Time   NA 133 (L) 04/08/2023 1303   K 4.6 04/08/2023 1303   CL 98 04/08/2023 1303   CO2 23 04/08/2023 1303   BUN 21 04/08/2023 1303   CREATININE 0.68 (L) 04/08/2023 1303   CREATININE 0.81 04/02/2023 1555   GLUCOSE 78 04/08/2023 1303   GLUCOSE 86 04/02/2023 1555   CALCIUM 9.0 04/08/2023 1303   AST 29 04/08/2023 1303   AST 35 04/02/2023 1555   ALT 40 04/08/2023 1303   ALT 67 (H) 04/02/2023 1555   ALKPHOS 98 04/08/2023 1303   BILITOT <0.2 04/08/2023 1303   BILITOT 0.6 04/02/2023 1555   PROT 6.4 04/08/2023 1303   ALBUMIN 3.3 (L) 04/08/2023 1303     Present during today's visit: patient and his daughter  Start plan: Patient to start selpercatinib tomorrow 04/11/23.   Patient Education I spoke with patient and patient's daughter Irving Burton for overview of new oral chemotherapy medication: selpercatinib   Administration: Counseled patient on administration, dosing, side effects, monitoring, drug-food interactions, safe handling, storage, and disposal. Patient will take 3 capsules (120 mg total) by mouth 2 (two) times daily.   Side Effects: Side effects include but not limited to: rash, nausea, fatigue, changes in liver function, mouth sores, diarrhea, hypertension, edema.    Drug-drug Interactions (DDI): DDIs addressed with patient last week  Adherence: After discussion with patient no patient barriers to medication adherence identified.  Reviewed with patient importance of keeping a medication schedule and plan for any  missed doses.  The [facility] voiced understanding and appreciation. All questions answered. Medication handout provided.  Provided patient with Oral Chemotherapy Navigation Clinic phone number. Patient knows to call the office with questions or concerns. Oral Chemotherapy Navigation Clinic will continue to follow.  Patient expressed understanding and was in agreement with this plan. He also understands that He can call clinic at any time with any questions, concerns, or complaints.   Medication Access Issues: No issues, patient has med in hand  Follow-up plan: RTC in one week  Thank you for allowing me to participate in the care of this patient.   Time Total: 20 mins  Visit consisted of counseling and education on dealing with issues of symptom management in the setting of serious and potentially life-threatening illness.Greater than 50%  of this time was spent counseling and coordinating care related to the above assessment and plan.  Signed by: Remi Haggard, PharmD, BCPS, Nolon Bussing, CPP Hematology/Oncology Clinical Pharmacist Practitioner [place]/DB/AP Oral Chemotherapy Navigation Clinic [phone]  04/11/2023 3:16 PM

## 2023-04-11 NOTE — Assessment & Plan Note (Signed)
Secondary to chronic alcohol use. improved Encourage patient's alcohol cessation effort. Will close monitor LFT every 2 weeks for the first 3 months of stat of Retevmo,

## 2023-04-11 NOTE — Progress Notes (Signed)
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 Prognosis is poor. Clinically patient has improved. He has RET mutation. Recommend  Retevmo 120mg  BID.  Potential side effects were reviewed with patient and daughter they agree with the plan.  EKG showed normal QT.  Proceed with Retevmo   Brain mass S/p palliative brain radiation. recommend patient to continue another weekly of Dexamethasone 2mg  daily, and then decrease to 1mg  daily for 2 weeks then stop.   Elevated LFTs Secondary to chronic alcohol use. improved Encourage patient's alcohol cessation effort. Will close monitor LFT every 2 weeks for the first 3 months of stat of Retevmo,   Encounter for antineoplastic chemotherapy Chemotherapy plan as listed above.    interpreter service was utilized for the entire encounter today.  Orders Placed This Encounter  Procedures   CBC with Differential (Cancer Center Only)    Standing Status:   Future    Standing Expiration Date:   04/10/2024   CMP (Cancer Center only)    Standing Status:   Future    Standing Expiration Date:   04/10/2024   Magnesium    Standing Status:   Future    Standing Expiration Date:   04/10/2024   Follow-up 1 week All questions were answered. The patient knows to call the clinic with any problems, questions or concerns.  Rickard Patience, MD, PhD [facility] Hematology Oncology 04/11/2023    HISTORY OF PRESENTING ILLNESS:  Cody [name] 77 y.o. male  presents to establish care for metastatic lung cancer I have reviewed his chart and materials related to his cancer extensively and collaborated history with the patient. Summary of oncologic history is as follows: Oncology History  Metastatic non-small cell lung cancer (HCC)  03/05/2023 Imaging   CT chest abdomen pelvis with contrast showed large pericardial effusion. Moderate right and small left effusion with some loculated components on the left. Adjacent parenchymal opacities. Atelectasis versus infiltrates. Few borderline enlarged lymph nodes including mediastinum, upper retroperitoneum. Enlarged prostate with mass effect along the bladder with bladder wall thickening. No bowel obstruction or free air. Small area of wall thickening along the colon.    03/05/2023 Imaging   CT head showed abdominal vasogenic edema in the right posterior frontal region and in the left occipital lobe worrisome for presence of occult mass lesions. Questionable mild edema and mass effect in the inferior right cerebellum. No visible hemorrhage. 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 Atherosclerotic calcification of the major vessels at the base of brain.    03/05/2023 Imaging   MRI brain with and without contrast showed multiple contrast-enhancing lesions in the right frontal lobe, left occipital lobe, superior right cerebellum with surrounding vasogenic edema. Worrisome for intracranial metastatic disease. Small dural based contrast-enhancing lesions along the left frontal convexity could represent a small hemangioma. Metastatic disease is not excluded.    03/05/2023 - 03/12/2023 Hospital Admission   Patient presented emergency room due to confusion, shortness of breath. CT head and MRI showed multiple contrast-enhancing lesions with vasogenic edema CT chest abdomen pelvis showed large  pericardial effusion, pleural effusion, mediastinal/upper retroperitoneum lymphadenopathy Patient was treated with Decadron,  neurosurgery recommended no intervention. Status post pericardiocentesis on 03/07/2023.  Pigtail catheter was initially placed and later on removed.  TTE showed no pericardial effusion. Bilateral pleural effusion, status post thoracentesis.  Cytology positive. 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  Patient was treated with 3% saline clinic sodium improved. Gout with uric acid of 9.7.  Started on colchicine. Alcohol use disorder, status post CIWA protocol with Ativan as needed.   03/07/2023 Cancer Staging   Staging form: Lung, AJCC 8th Edition - Clinical stage from 03/07/2023: Stage IV (cTX, cN2, cM1) - Signed by Rickard Patience, MD on 03/13/2023 Stage prefix: Initial diagnosis   03/07/2023 Echocardiogram   1. Left ventricular ejection fraction, by estimation, is 60 to 65%. The left ventricle has normal function. The left ventricle has no regional  wall motion abnormalities. Indeterminate diastolic filling due to E-A fusion.   2. Right ventricular systolic function is normal. The right ventricular size is normal.   3. Moderate pericardial effusion.   4. The mitral valve is normal in structure. Mild mitral valve regurgitation. No evidence of mitral stenosis.   5. The aortic valve is normal in structure. Aortic valve regurgitation is mild. No aortic stenosis is present.   6. The inferior vena cava is normal in size with greater than 50% respiratory variability, suggesting right atrial pressure of 3 mmHg.     03/07/2023 Procedure   -5/9 S/p pericardiocentesis with removal of 1.1 L of bloody fluid.  Pigtail catheter left in place.  -Cytology came back positive for malignancy.  Metastatic adenocarcinoma, compatible with pulmonary adenocarcinoma   -5/9 s/p  thoracentesis, cytology is negative.    03/08/2023 Initial Diagnosis   Metastatic non-small cell lung cancer (HCC)  [facility] NGS showed RET [id number] missense variant [VAF33.8%] NF1 frame shift, TP53, NFE2L2, CDKN2A,  CKKN2B TMB 15.8, MSI stable.   03/11/2023 Echocardiogram   1. Left ventricular ejection fraction, by estimation, is 60 to 65%. The left ventricle has normal function.   2. Mild to moderate mitral valve regurgitation.   3. Aortic valve regurgitation is mild to moderate    03/19/2023 - 04/03/2023 Radiation Therapy   Whole Brain Radiation.    03/20/2023 - 04/03/2023 Radiation Therapy   Whole Brain Radiation.    03/23/2023 Imaging   PET scan showed 13 mm irregular left lower lobe nodule, likely corresponding to the patient's known primary bronchogenic carcinoma.   Thoracic and upper abdominal nodal metastases, as described above.   Irregular appearance of the posterior gastric antrum, with associated hypermetabolism, as above. Appearance favors gastritis versus gastric ulcer. Consider upper endoscopy for further evaluation.   Prior pericardial effusion and bilateral pleural effusions have resolved.    Today patient was accompanied by her daughter Tiajuana Amass the clinic.  He is getting whole brain radiation.  He takesDex 2mg  daily Speaks with [place]  interpreter service was utilized for the entire encounter today.   Patient finished  whole brain radiation.  + insomnia,  He feels well today, Denies SOB, cough.  He has stopped smoking and drinking alcohol    MEDICAL HISTORY:  Past Medical History:  Diagnosis Date   Alcohol dependence (HCC)    CHF (congestive heart failure) (HCC)    Hypertension    Tobacco abuse     SURGICAL HISTORY: Past Surgical History:  Procedure Laterality Date   APPENDECTOMY     LEFT HEART CATH AND CORONARY ANGIOGRAPHY N/A 08/25/2018  Procedure: LEFT HEART CATH AND CORONARY ANGIOGRAPHY;  Surgeon: Laurier Nancy, MD;  Location: [facility] INVASIVE CV LAB;  Service: Cardiovascular;  Laterality: N/A;   PERICARDIOCENTESIS N/A 03/06/2023   Procedure: PERICARDIOCENTESIS;  Surgeon: Marcina Millard, MD;  Location: [facility] INVASIVE CV LAB;  Service: Cardiovascular;   Laterality: N/A;    SOCIAL HISTORY: Social History   Socioeconomic History   Marital status: Married    Spouse name: Not on file   Number of children: Not on file   Years of education: Not on file   Highest education level: Not on file  Occupational History   Not on file  Tobacco Use   Smoking status: Former    Packs/day: 0.50    Years: 50.00    Additional pack years: 0.00    Total pack years: 25.00    Types: Cigarettes    Quit date: 03/08/2023    Years since quitting: 0.0    Passive exposure: Current   Smokeless tobacco: Never  Vaping Use   Vaping Use: Never used  Substance and Sexual Activity   Alcohol use: Not Currently    Comment: stopping drinking approx 03/03/23   Drug use: Never   Sexual activity: Not Currently    Birth control/protection: None  Other Topics Concern   Not on file  Social History Narrative   Not on file   Social Determinants of Health   Financial Resource Strain: Low Risk  (08/25/2018)   Overall Financial Resource Strain (CARDIA)    Difficulty of Paying Living Expenses: Not very hard  Food Insecurity: Not on file  Transportation Needs: Unknown (08/25/2018)   PRAPARE - Transportation    Lack of Transportation (Medical): No    Lack of Transportation (Non-Medical): Not on file  Physical Activity: Not on file  Stress: No Stress Concern Present (08/25/2018)   [employer] of Occupational Health - Occupational Stress Questionnaire    Feeling of Stress : Only a little  Social Connections: Not on file  Intimate Partner Violence: Not on file    FAMILY HISTORY: History reviewed. No pertinent family history.  ALLERGIES:  is allergic to shellfish allergy.  MEDICATIONS:  Current Outpatient Medications  Medication Sig Dispense Refill   allopurinol (ZYLOPRIM) 100 MG tablet Take 1 tablet (100 mg total) by mouth daily. 30 tablet 11   dexamethasone (DECADRON) 1 MG tablet Take 1 tablet (1 mg total) by mouth daily with breakfast. 14 tablet 0    finasteride (PROSCAR) 5 MG tablet Take 1 tablet (5 mg total) by mouth daily. 90 tablet 3   fluconazole (DIFLUCAN) 100 MG tablet Take 1 tablet (100 mg total) by mouth daily. 7 tablet 0   folic acid (FOLVITE) 1 MG tablet Take 1 tablet (1 mg total) by mouth daily. 30 tablet 2   iron polysaccharides (NIFEREX) 150 MG capsule Take 1 capsule (150 mg total) by mouth daily. 30 capsule 2   levETIRAcetam (KEPPRA) 500 MG tablet Take 1 tablet (500 mg total) by mouth 2 (two) times daily. 60 tablet 5   loperamide (IMODIUM) 2 MG capsule Take 1 capsule (2 mg total) by mouth See admin instructions. Initial: 4 mg,the 2 mg every 2 hours (4 mg every 4 hours at night)  maximum: 16 mg/day 60 capsule 2   Multiple Vitamin (MULTIVITAMIN WITH MINERALS) TABS tablet Take 1 tablet by mouth daily. 30 tablet 2   polyethylene glycol (MIRALAX / GLYCOLAX) 17 g packet Take 17 g by mouth daily as needed for mild constipation or moderate constipation. 14  each 0   prochlorperazine (COMPAZINE) 10 MG tablet Take 1 tablet (10 mg total) by mouth every 6 (six) hours as needed for nausea or vomiting. 60 tablet 0   selpercatinib (RETEVMO) 40 MG capsule Take 3 capsules (120 mg total) by mouth 2 (two) times daily. 180 capsule 0   tamsulosin (FLOMAX) 0.4 MG CAPS capsule Take 1 capsule (0.4 mg total) by mouth daily. 90 capsule 3   thiamine (VITAMIN B-1) 100 MG tablet Take 1 tablet (100 mg total) by mouth daily. 30 tablet 2   Vitamin D, Ergocalciferol, (DRISDOL) 1.25 MG (50000 UNIT) CAPS capsule Take 1 capsule (50,000 Units total) by mouth every 7 (seven) days. 12 capsule 0   No current facility-administered medications for this visit.    Review of Systems  Constitutional:  Positive for fatigue. Negative for appetite change, chills and fever.  HENT:   Negative for hearing loss and voice change.   Eyes:  Negative for eye problems and icterus.  Respiratory:  Negative for chest tightness, cough and shortness of breath.   Cardiovascular:  Negative  for chest pain and leg swelling.  Gastrointestinal:  Negative for abdominal distention and abdominal pain.  Endocrine: Negative for hot flashes.  Genitourinary:  Negative for difficulty urinating, dysuria and frequency.   Musculoskeletal:  Negative for arthralgias.  Skin:  Negative for itching and rash.  Neurological:  Negative for light-headedness and numbness.  Hematological:  Negative for adenopathy. Does not bruise/bleed easily.  Psychiatric/Behavioral:  Positive for sleep disturbance. Negative for confusion.      PHYSICAL EXAMINATION: ECOG PERFORMANCE STATUS: 2 - Symptomatic, <50% confined to bed  Vitals:   04/11/23 1056  BP: 107/68  Pulse: (!) 107  Resp: 18  Temp: 97.9 F (36.6 C)   Filed Weights   04/11/23 1056  Weight: 101 lb (45.8 kg)    Physical Exam Constitutional:      General: He is not in acute distress.    Appearance: He is ill-appearing. He is not diaphoretic.  HENT:     Head: Normocephalic and atraumatic.     Mouth/Throat:     Pharynx: Oropharyngeal exudate present.  Eyes:     General: No scleral icterus. Cardiovascular:     Rate and Rhythm: Normal rate and regular rhythm.  Pulmonary:     Effort: Pulmonary effort is normal. No respiratory distress.  Abdominal:     General: There is no distension.  Musculoskeletal:        General: Normal range of motion.     Cervical back: Normal range of motion and neck supple.  Skin:    Findings: No erythema.  Neurological:     Mental Status: He is alert. Mental status is at baseline.     Cranial Nerves: No cranial nerve deficit.     Motor: No abnormal muscle tone.  Psychiatric:        Mood and Affect: Affect normal.      LABORATORY DATA:  I have reviewed the data as listed    Latest Ref Rng & Units 04/02/2023    3:55 PM 03/21/2023    3:13 PM 03/12/2023    4:26 AM  CBC  WBC 4.0 - 10.5 K/uL 11.8  17.6  12.6   Hemoglobin 13.0 - 17.0 g/dL 16.1  09.6  04.5   Hematocrit 39.0 - 52.0 % 38.7  35.2  34.5    Platelets 150 - 400 K/uL 261  241  267       Latest Ref Rng & Units  04/08/2023    1:03 PM 04/02/2023    3:55 PM 03/12/2023    4:26 AM  CMP  Glucose 70 - 99 mg/dL 78  86  94   BUN 8 - 27 mg/dL 21  28  19    Creatinine 0.76 - 1.27 mg/dL 1.61  0.96  0.45   Sodium 134 - 144 mmol/L 133  131  131   Potassium 3.5 - 5.2 mmol/L 4.6  4.7  4.2   Chloride 96 - 106 mmol/L 98  96  100   CO2 20 - 29 mmol/L 23  29  25    Calcium 8.6 - 10.2 mg/dL 9.0  8.9  8.2   Total Protein 6.0 - 8.5 g/dL 6.4  7.3  5.4   Total Bilirubin 0.0 - 1.2 mg/dL <4.0  0.6  0.5   Alkaline Phos 44 - 121 IU/L 98  88  66   AST 0 - 40 IU/L 29  35  75   ALT 0 - 44 IU/L 40  67  106      RADIOGRAPHIC STUDIES: I have personally reviewed the radiological images as listed and agreed with the findings in the report. NM PET Image Initial (PI) Skull Base To Thigh  Result Date: 03/23/2023 CLINICAL DATA:  Initial treatment strategy for metastatic non-small cell lung cancer. EXAM: NUCLEAR MEDICINE PET SKULL BASE TO THIGH TECHNIQUE: 5.4 mCi F-18 FDG was injected intravenously. Full-ring PET imaging was performed from the skull base to thigh after the radiotracer. CT data was obtained and used for attenuation correction and anatomic localization. Fasting blood glucose: 85 mg/dl COMPARISON:  CT chest abdomen pelvis dated 03/05/2023 FINDINGS: Mediastinal blood pool activity: SUV max 1.8 Liver activity: SUV max NA NECK: No hypermetabolic cervical lymphadenopathy. Incidental CT findings: None. CHEST: 13 mm irregular left lower lobe nodule (series 3/image 87), max SUV 3.2, suspicious. Thoracic lymphadenopathy, including: --5 mm short axis right supraclavicular node (series 3/image 38), max SUV 5.8 --8 mm short axis high right paratracheal node (series 3/image 46), max SUV 8.3 --12 mm short axis subcarinal node (series 3/image 86), max SUV 9.9 --Left perihilar hypermetabolism, max SUV 5.2 Incidental CT findings: Prior pericardial effusion has resolved.  Atherosclerotic calcifications of the arch. Moderate three-vessel coronary atherosclerosis. Patchy left lower lobe atelectasis. Prior pleural effusions have resolved. ABDOMEN/PELVIS: No abnormal hypermetabolism in the liver, spleen, pancreas, or adrenal glands. Irregular appearance of the posterior gastric antrum (series 3/image 83), non masslike but with associated mild hypermetabolism in this region (max SUV 5.7). Upper abdominal and retroperitoneal lymphadenopathy, including: --10 mm short axis peripancreatic node (series 3/image 81), max SUV 7.1 --12 mm short axis celiac axis node (series 3/image 87), max SUV 7.1 --12 mm short axis right para-aortic node (series 3/image 90), max SUV 6.8 --12 mm short axis left para-aortic node (series 3/image 92), max SUV 7.0 Incidental CT findings: Atherosclerotic calcifications the abdominal aorta and branch vessels. Moderate colonic stool burden, suggesting mild constipation. Prostatomegaly, suggesting BPH, with thick-walled bladder, suggesting chronic bladder outlet obstruction. SKELETON: No focal hypermetabolic activity to suggest skeletal metastasis. Incidental CT findings: Degenerative changes of the visualized thoracolumbar spine. IMPRESSION: 13 mm irregular left lower lobe nodule, likely corresponding to the patient's known primary bronchogenic carcinoma. Thoracic and upper abdominal nodal metastases, as described above. Irregular appearance of the posterior gastric antrum, with associated hypermetabolism, as above. Appearance favors gastritis versus gastric ulcer. Consider upper endoscopy for further evaluation. Prior pericardial effusion and bilateral pleural effusions have resolved. Electronically Signed   By: Lurlean Horns  Rito Ehrlich M.D.   On: 03/23/2023 01:35

## 2023-04-11 NOTE — Progress Notes (Signed)
Pt here for follow up. Per pt's daughter, pt has a new rash to right leg. Rash started when he started taking Retevmo

## 2023-04-15 ENCOUNTER — Ambulatory Visit: Payer: Medicare Other | Admitting: Urology

## 2023-04-15 ENCOUNTER — Ambulatory Visit (INDEPENDENT_AMBULATORY_CARE_PROVIDER_SITE_OTHER): Payer: Medicare Other | Admitting: Cardiovascular Disease

## 2023-04-15 ENCOUNTER — Encounter: Payer: Self-pay | Admitting: Cardiovascular Disease

## 2023-04-15 VITALS — BP 120/76 | HR 70 | Ht 59.0 in | Wt 101.6 lb

## 2023-04-15 DIAGNOSIS — C3490 Malignant neoplasm of unspecified part of unspecified bronchus or lung: Secondary | ICD-10-CM

## 2023-04-15 DIAGNOSIS — I509 Heart failure, unspecified: Secondary | ICD-10-CM

## 2023-04-15 DIAGNOSIS — I3139 Other pericardial effusion (noninflammatory): Secondary | ICD-10-CM

## 2023-04-15 DIAGNOSIS — J441 Chronic obstructive pulmonary disease with (acute) exacerbation: Secondary | ICD-10-CM | POA: Diagnosis not present

## 2023-04-15 NOTE — Progress Notes (Signed)
Cardiology Office Note   Date:  04/15/2023   ID:  Cody [name] 17-Mar-1946, MRN [id number]  PCP:  Orson Eva, NP  Cardiologist:  Adrian Blackwater, MD      History of Present Illness: Cody [name] is a 77 y.o. male who presents for  Chief Complaint  Patient presents with   Follow-up    4 weeks follow up    Doing better after discharged.      Past Medical History:  Diagnosis Date   Alcohol dependence (HCC)    CHF (congestive heart failure) (HCC)    Hypertension    Tobacco abuse      Past Surgical History:  Procedure Laterality Date   APPENDECTOMY     LEFT HEART CATH AND CORONARY ANGIOGRAPHY N/A 08/25/2018   Procedure: LEFT HEART CATH AND CORONARY ANGIOGRAPHY;  Surgeon: Laurier Nancy, MD;  Location: [facility] INVASIVE CV LAB;  Service: Cardiovascular;  Laterality: N/A;   PERICARDIOCENTESIS N/A 03/06/2023   Procedure: PERICARDIOCENTESIS;  Surgeon: Marcina Millard, MD;  Location: [facility] INVASIVE CV LAB;  Service: Cardiovascular;  Laterality: N/A;     Current Outpatient Medications  Medication Sig Dispense Refill   allopurinol (ZYLOPRIM) 100 MG tablet Take 1 tablet (100 mg total) by mouth daily. 30 tablet 11   dexamethasone (DECADRON) 1 MG tablet Take 1 tablet (1 mg total) by mouth daily with breakfast. 14 tablet 0   finasteride (PROSCAR) 5 MG tablet Take 1 tablet (5 mg total) by mouth daily. 90 tablet 3   fluconazole (DIFLUCAN) 100 MG tablet Take 1 tablet (100 mg total) by mouth daily. 7 tablet 0   folic acid (FOLVITE) 1 MG tablet Take 1 tablet (1 mg total) by mouth daily. 30 tablet 2   iron polysaccharides (NIFEREX) 150 MG capsule Take 1 capsule (150 mg total) by mouth daily. 30 capsule 2   levETIRAcetam (KEPPRA) 500 MG tablet Take 1 tablet (500 mg total) by mouth 2 (two) times daily. 60 tablet 5   loperamide (IMODIUM) 2 MG capsule Take 1 capsule (2 mg total) by mouth See admin instructions. Initial: 4 mg,the 2 mg every 2 hours (4 mg every 4 hours at night)  maximum: 16  mg/day 60 capsule 2   Multiple Vitamin (MULTIVITAMIN WITH MINERALS) TABS tablet Take 1 tablet by mouth daily. 30 tablet 2   polyethylene glycol (MIRALAX / GLYCOLAX) 17 g packet Take 17 g by mouth daily as needed for mild constipation or moderate constipation. 14 each 0   prochlorperazine (COMPAZINE) 10 MG tablet Take 1 tablet (10 mg total) by mouth every 6 (six) hours as needed for nausea or vomiting. 60 tablet 0   selpercatinib (RETEVMO) 40 MG capsule Take 3 capsules (120 mg total) by mouth 2 (two) times daily. 180 capsule 0   tamsulosin (FLOMAX) 0.4 MG CAPS capsule Take 1 capsule (0.4 mg total) by mouth daily. 90 capsule 3   thiamine (VITAMIN B-1) 100 MG tablet Take 1 tablet (100 mg total) by mouth daily. 30 tablet 2   Vitamin D, Ergocalciferol, (DRISDOL) 1.25 MG (50000 UNIT) CAPS capsule Take 1 capsule (50,000 Units total) by mouth every 7 (seven) days. 12 capsule 0   No current facility-administered medications for this visit.    Allergies:   Shellfish allergy    Social History:   reports that he quit smoking about 5 weeks ago. His smoking use included cigarettes. He has a 25.00 pack-year smoking history. He has been exposed to tobacco smoke. He has never used smokeless  tobacco. He reports that he does not currently use alcohol. He reports that he does not use drugs.   Family History:  family history is not on file.    ROS:     Review of Systems  Constitutional: Negative.   HENT: Negative.    Eyes: Negative.   Respiratory: Negative.    Gastrointestinal: Negative.   Genitourinary: Negative.   Musculoskeletal: Negative.   Skin: Negative.   Neurological: Negative.   Endo/Heme/Allergies: Negative.   Psychiatric/Behavioral: Negative.    All other systems reviewed and are negative.     All other systems are reviewed and negative.    PHYSICAL EXAM: VS:  BP 120/76   Pulse 70   Ht 4\' 11"  (1.499 m)   Wt 101 lb 9.6 oz (46.1 kg)   BMI 20.52 kg/m  , BMI Body mass index is 20.52  kg/m. Last weight:  Wt Readings from Last 3 Encounters:  04/15/23 101 lb 9.6 oz (46.1 kg)  04/11/23 101 lb (45.8 kg)  04/02/23 95 lb 8 oz (43.3 kg)     Physical Exam Vitals reviewed.  Constitutional:      Appearance: Normal appearance. He is normal weight.  HENT:     Head: Normocephalic.     Nose: Nose normal.     Mouth/Throat:     Mouth: Mucous membranes are moist.  Eyes:     Pupils: Pupils are equal, round, and reactive to light.  Cardiovascular:     Rate and Rhythm: Normal rate and regular rhythm.     Pulses: Normal pulses.     Heart sounds: Normal heart sounds.  Pulmonary:     Effort: Pulmonary effort is normal.  Abdominal:     General: Abdomen is flat. Bowel sounds are normal.  Musculoskeletal:        General: Normal range of motion.     Cervical back: Normal range of motion.  Skin:    General: Skin is warm.  Neurological:     General: No focal deficit present.     Mental Status: He is alert.  Psychiatric:        Mood and Affect: Mood normal.       EKG:   Recent Labs: 03/05/2023: B Natriuretic Peptide 99.4 03/12/2023: Magnesium 1.9 04/02/2023: Hemoglobin 12.3; Platelet Count 261 04/08/2023: ALT 40; BUN 21; Creatinine, Ser 0.68; Potassium 4.6; Sodium 133; TSH 1.640    Lipid Panel    Component Value Date/Time   CHOL 182 04/08/2023 1303   TRIG 83 04/08/2023 1303   HDL 76 04/08/2023 1303   CHOLHDL 2.4 04/08/2023 1303   LDLCALC 91 04/08/2023 1303      Other studies Reviewed: Additional studies/ records that were reviewed today include:  Review of the above records demonstrates:       No data to display            ASSESSMENT AND PLAN:    ICD-10-CM   1. Congestive heart failure, unspecified HF chronicity, unspecified heart failure type (HCC)  I50.9    LVEF normal with grade 1 diastolic dysfunction    2. Metastatic non-small cell lung cancer (HCC)  C34.90     3. Chronic obstructive pulmonary disease with acute exacerbation (HCC)  J44.1      4. Pericardial effusion  I31.39    ECHO had no PE       Problem List Items Addressed This Visit       Cardiovascular and Mediastinum   Pericardial effusion (Chronic)   Congestive heart  failure (HCC) - Primary     Respiratory   Metastatic non-small cell lung cancer (HCC) (Chronic)   Chronic obstructive pulmonary disease (HCC)       Disposition:   No follow-ups on file.    Total time spent: 30 minutes  Signed,  Adrian Blackwater, MD  04/15/2023 10:50 AM    [facility]

## 2023-04-16 ENCOUNTER — Ambulatory Visit (INDEPENDENT_AMBULATORY_CARE_PROVIDER_SITE_OTHER): Payer: Medicare Other | Admitting: Nurse Practitioner

## 2023-04-16 ENCOUNTER — Ambulatory Visit: Payer: Medicare Other | Admitting: Cardiovascular Disease

## 2023-04-16 VITALS — BP 134/78 | HR 92 | Ht 59.0 in | Wt 100.0 lb

## 2023-04-16 DIAGNOSIS — E782 Mixed hyperlipidemia: Secondary | ICD-10-CM

## 2023-04-16 DIAGNOSIS — K5903 Drug induced constipation: Secondary | ICD-10-CM | POA: Diagnosis not present

## 2023-04-16 DIAGNOSIS — J449 Chronic obstructive pulmonary disease, unspecified: Secondary | ICD-10-CM | POA: Diagnosis not present

## 2023-04-16 MED ORDER — LACTULOSE 20 GM/30ML PO SOLN: 20.0000 g | Freq: Two times a day (BID) | ORAL | 1 refills | Status: DC

## 2023-04-16 NOTE — Progress Notes (Signed)
Established Patient Office Visit  Subjective:  Patient ID: Cody [name], male    DOB: 04-Aug-1946  Age: 77 y.o. MRN: [id number]  Chief Complaint  Patient presents with   Follow-up    2 weeks follow up    2 week follow up appt today and reviewed patient's labs which overall are WNL, eGFR is well above normal.  Patient is eating well, drinking Ensure.  Lactulose prescribed today as patient's having constipation, last BM 3 days.  Has appt with Dr. Cathie Hoops this Friday.  Continues to be cigarette free and ETOH free x 2 months.    No other concerns at this time.   Past Medical History:  Diagnosis Date   Alcohol dependence (HCC)    CHF (congestive heart failure) (HCC)    Hypertension    Tobacco abuse     Past Surgical History:  Procedure Laterality Date   APPENDECTOMY     LEFT HEART CATH AND CORONARY ANGIOGRAPHY N/A 08/25/2018   Procedure: LEFT HEART CATH AND CORONARY ANGIOGRAPHY;  Surgeon: Laurier Nancy, MD;  Location: [facility]IVE [facility];  Service: Cardiovascular;  Laterality: N/A;   PERICARDIOCENTESIS N/A 03/06/2023   Procedure: PERICARDIOCENTESIS;  Surgeon: Marcina Millard, MD;  Location: [facility] INV[facility];  Service: Cardiovascular;  Laterality: N/A;    Social History   Socioeconomic History   Marital status: Married    Spouse name: Not on file   Number of children: Not on file   Years of education: Not on file   Highest education level: Not on file  Occupational History   Not on file  Tobacco Use   Smoking status: Former    Packs/day: 0.50    Years: 50.00    Additional pack years: 0.00    Total pack years: 25.00    Types: Cigarettes    Quit date: 03/08/2023    Years since quitting: 0.1    Passive exposure: Current   Smokeless tobacco: Never  Vaping Use   Vaping Use: Never used  Substance and Sexual Activity   Alcohol use: Not Currently    Comment: stopping drinking approx 03/03/23   Drug use: Never   Sexual activity: Not Currently    Birth control/protection:  None  Other Topics Concern   Not on file  Social History Narrative   Not on file   Social Determinants of Health   Financial Resource Strain: Low Risk  (08/25/2018)   Overall Financial Resource Strain (CARDIA)    Difficulty of Paying Living Expenses: Not very hard  Food Insecurity: Not on file  Transportation Needs: Unknown (08/25/2018)   PRAPARE - Transportation    Lack of Transportation (Medical): No    Lack of Transportation (Non-Medical): Not on file  Physical Activity: Not on file  Stress: No Stress Concern Present (08/25/2018)   [employer] of Occupational Health - Occupational Stress Questionnaire    Feeling of Stress : Only a little  Social Connections: Not on file  Intimate Partner Violence: Not on file    No family history on file.  Allergies  Allergen Reactions   Shellfish Allergy Hives    Only allergic to crabs    Review of Systems  Constitutional: Negative.   HENT: Negative.    Eyes: Negative.   Respiratory: Negative.    Cardiovascular: Negative.   Gastrointestinal:  Positive for constipation.  Genitourinary: Negative.   Musculoskeletal: Negative.   Skin: Negative.   Neurological: Negative.   Endo/Heme/Allergies: Negative.   Psychiatric/Behavioral: Negative.  Objective:   BP 134/78   Pulse 92   Ht 4\' 11"  (1.499 m)   Wt 100 lb (45.4 kg)   SpO2 98%   BMI 20.20 kg/m   Vitals:   04/16/23 1303  BP: 134/78  Pulse: 92  Height: 4\' 11"  (1.499 m)  Weight: 100 lb (45.4 kg)  SpO2: 98%  BMI (Calculated): 20.19    Physical Exam Vitals and nursing note reviewed.  Constitutional:      Appearance: Normal appearance.  HENT:     Head: Normocephalic.     Nose: Nose normal.     Mouth/Throat:     Mouth: Mucous membranes are moist.  Eyes:     Pupils: Pupils are equal, round, and reactive to light.  Cardiovascular:     Rate and Rhythm: Normal rate and regular rhythm.  Pulmonary:     Effort: Pulmonary effort is normal.     Breath  sounds: Normal breath sounds.  Abdominal:     General: Bowel sounds are normal.     Palpations: Abdomen is soft.  Musculoskeletal:        General: Normal range of motion.     Cervical back: Normal range of motion and neck supple.  Skin:    General: Skin is warm and dry.  Neurological:     Mental Status: He is alert and oriented to person, place, and time.  Psychiatric:        Mood and Affect: Mood normal.        Behavior: Behavior normal.      No results found for any visits on 04/16/23.  Recent Results (from the past 2160 hour(s))  Comprehensive metabolic panel     Status: Abnormal   Collection Time: 03/05/23 11:12 AM  Result Value Ref Range   Sodium 117 (LL) 135 - 145 mmol/L    Comment: CRITICAL RESULT CALLED TO, READ BACK BY AND VERIFIED WITH SAMANTHA HAMILTON 03/05/23 @ 1151 BY SB    Potassium 4.9 3.5 - 5.1 mmol/L   Chloride 87 (L) 98 - 111 mmol/L   CO2 18 (L) 22 - 32 mmol/L   Glucose, Bld 154 (H) 70 - 99 mg/dL    Comment: Glucose reference range applies only to samples taken after fasting for at least 8 hours.   BUN 39 (H) 8 - 23 mg/dL   Creatinine, Ser 4.09 0.61 - 1.24 mg/dL   Calcium 8.5 (L) 8.9 - 10.3 mg/dL   Total Protein 7.5 6.5 - 8.1 g/dL   Albumin 3.0 (L) 3.5 - 5.0 g/dL   AST 811 (H) 15 - 41 U/L    Comment: RESULT CONFIRMED BY MANUAL DILUTION   SB   ALT 214 (H) 0 - 44 U/L   Alkaline Phosphatase 84 38 - 126 U/L   Total Bilirubin 1.0 0.3 - 1.2 mg/dL   GFR, Estimated >91 >47 mL/min    Comment: (NOTE) Calculated using the CKD-EPI Creatinine Equation (2021)    Anion gap 12 5 - 15    Comment: Performed at [facility], [address] [place], [place] [address]  CBC     Status: Abnormal   Collection Time: 03/05/23 11:12 AM  Result Value Ref Range   WBC 13.2 (H) 4.0 - 10.5 K/uL   RBC 3.51 (L) 4.22 - 5.81 MIL/uL   Hemoglobin 10.9 (L) 13.0 - 17.0 g/dL   HCT 21.3 (L) 08.6 - 57.8 %   MCV 93.4 80.0 - 100.0 fL   MCH 31.1 26.0 - 34.0 pg  MCHC 33.2 30.0  - 36.0 g/dL   RDW 16.1 09.6 - 04.5 %   Platelets 357 150 - 400 K/uL   nRBC 0.0 0.0 - 0.2 %    Comment: Performed at [facility], [address] [place], [place] [address]  Brain natriuretic peptide     Status: None   Collection Time: 03/05/23 11:12 AM  Result Value Ref Range   B Natriuretic Peptide 99.4 0.0 - 100.0 pg/mL    Comment: Performed at [facility], [address] [place], [place] [address]  Blood culture (routine x 2)     Status: None   Collection Time: 03/05/23  2:10 PM   Specimen: BLOOD  Result Value Ref Range   Specimen Description BLOOD BLOOD RIGHT ARM    Special Requests      BOTTLES DRAWN AEROBIC AND ANAEROBIC Blood Culture adequate volume   Culture      NO GROWTH 5 DAYS Performed at [facility], [address] [place], [place] [address]    Report Status 03/10/2023 FINAL   Blood culture (routine x 2)     Status: None   Collection Time: 03/05/23  2:10 PM   Specimen: BLOOD  Result Value Ref Range   Specimen Description BLOOD BLOOD LEFT ARM    Special Requests      BOTTLES DRAWN AEROBIC AND ANAEROBIC Blood Culture adequate volume   Culture      NO GROWTH 5 DAYS Performed at [facility], [address] [place], [place] [address]    Report Status 03/10/2023 FINAL   Osmolality, urine     Status: None   Collection Time: 03/05/23  2:12 PM  Result Value Ref Range   Osmolality, Ur 694 300 - 900 mOsm/kg    Comment: REPEATED TO VERIFY Performed at [facility], [address] [place], [place] [address]   Sodium, urine, random     Status: None   Collection Time: 03/05/23  2:12 PM  Result Value Ref Range   Sodium, Ur <10 mmol/L    Comment: Performed at [facility], [address] [place], [place] [address]  Osmolality     Status: None   Collection Time: 03/05/23  4:56 PM  Result Value Ref Range   Osmolality 276 275 - 295 mOsm/kg    Comment: REPEATED TO VERIFY Performed at [facility],  [address] [place], [place] [address]   Sodium     Status: Abnormal   Collection Time: 03/05/23  4:56 PM  Result Value Ref Range   Sodium 118 (LL) 135 - 145 mmol/L    Comment: CRITICAL RESULT CALLED TO, READ BACK BY AND VERIFIED WITH ELAINA BROCKMAN AT 1747 ON 03/05/23 BY SS Performed at [facility], [address] [place], [place] [address]   Lactate dehydrogenase     Status: Abnormal   Collection Time: 03/05/23  4:56 PM  Result Value Ref Range   LDH 694 (H) 98 - 192 U/L    Comment: Performed at [facility], [address] [place], [place] [address]  ECHOCARDIOGRAM COMPLETE     Status: None   Collection Time: 03/05/23  7:54 PM  Result Value Ref Range   Weight 1,600 oz   BP 137/80 mmHg   Ao pk vel 1.04 m/s   AV Area VTI 2.84 cm2   AR max vel 2.86 cm2   AV Mean grad 2.5 mmHg   AV Peak grad 4.3 mmHg   S' Lateral  2.50 cm   AV Area mean vel 2.60 cm2   Area-P 1/2 4.28 cm2   P 1/2 time 507 msec   Est EF 55 - 60%   Sodium     Status: Abnormal   Collection Time: 03/05/23 10:13 PM  Result Value Ref Range   Sodium 123 (L) 135 - 145 mmol/L    Comment: Performed at [facility], [address] [place], [place] [address]  Glucose, capillary     Status: Abnormal   Collection Time: 03/05/23 10:57 PM  Result Value Ref Range   Glucose-Capillary 119 (H) 70 - 99 mg/dL    Comment: Glucose reference range applies only to samples taken after fasting for at least 8 hours.  Comprehensive metabolic panel     Status: Abnormal   Collection Time: 03/06/23  2:58 AM  Result Value Ref Range   Sodium 124 (L) 135 - 145 mmol/L   Potassium 5.0 3.5 - 5.1 mmol/L   Chloride 95 (L) 98 - 111 mmol/L   CO2 17 (L) 22 - 32 mmol/L   Glucose, Bld 129 (H) 70 - 99 mg/dL    Comment: Glucose reference range applies only to samples taken after fasting for at least 8 hours.   BUN 42 (H) 8 - 23 mg/dL   Creatinine, Ser 2.13 0.61 - 1.24 mg/dL   Calcium 8.5 (L) 8.9 - 10.3 mg/dL    Total Protein 6.7 6.5 - 8.1 g/dL   Albumin 2.7 (L) 3.5 - 5.0 g/dL   AST 086 (H) 15 - 41 U/L   ALT 217 (H) 0 - 44 U/L   Alkaline Phosphatase 82 38 - 126 U/L   Total Bilirubin 0.9 0.3 - 1.2 mg/dL   GFR, Estimated >57 >84 mL/min    Comment: (NOTE) Calculated using the CKD-EPI Creatinine Equation (2021)    Anion gap 12 5 - 15    Comment: Performed at [facility], [address] [place], [place] [address]  Protime-INR     Status: Abnormal   Collection Time: 03/06/23  2:58 AM  Result Value Ref Range   Prothrombin Time 18.3 (H) 11.4 - 15.2 seconds   INR 1.5 (H) 0.8 - 1.2    Comment: (NOTE) INR goal varies based on device and disease states. Performed at [facility], [address] [place], [place] [address]   CBC with Differential/Platelet     Status: Abnormal   Collection Time: 03/06/23  2:58 AM  Result Value Ref Range   WBC 11.3 (H) 4.0 - 10.5 K/uL   RBC 3.52 (L) 4.22 - 5.81 MIL/uL   Hemoglobin 10.9 (L) 13.0 - 17.0 g/dL   HCT 32.4 (L) 40.1 - 02.7 %   MCV 91.5 80.0 - 100.0 fL   MCH 31.0 26.0 - 34.0 pg   MCHC 33.9 30.0 - 36.0 g/dL   RDW 25.3 66.4 - 40.3 %   Platelets 309 150 - 400 K/uL   nRBC 0.2 0.0 - 0.2 %   Neutrophils Relative % 82 %   Neutro Abs 9.2 (H) 1.7 - 7.7 K/uL   Lymphocytes Relative 10 %   Lymphs Abs 1.2 0.7 - 4.0 K/uL   Monocytes Relative 5 %   Monocytes Absolute 0.6 0.1 - 1.0 K/uL   Eosinophils Relative 1 %   Eosinophils Absolute 0.1 0.0 - 0.5 K/uL   Basophils Relative 0 %   Basophils Absolute 0.0 0.0 - 0.1 K/uL   Immature Granulocytes 2 %   Abs Immature Granulocytes 0.27 (  H) 0.00 - 0.07 K/uL    Comment: Performed at [facility], [address] [place], [place] [address]  Uric acid     Status: Abnormal   Collection Time: 03/06/23  2:58 AM  Result Value Ref Range   Uric Acid, Serum 9.7 (H) 3.7 - 8.6 mg/dL    Comment: Performed at [facility], [address] [place], [place] [address]  TSH     Status: None    Collection Time: 03/06/23  2:58 AM  Result Value Ref Range   TSH 0.532 0.350 - 4.500 uIU/mL    Comment: Performed by a 3rd Generation assay with a functional sensitivity of <=0.01 uIU/mL. Performed at [facility], [address] [place], [place] [address]   Cortisol     Status: None   Collection Time: 03/06/23  2:58 AM  Result Value Ref Range   Cortisol, Plasma 8.2 ug/dL    Comment: (NOTE) AM    6.7 - 22.6 ug/dL PM   <91.4       ug/dL Performed at [facility], [address] [place], [place] [address]   Sodium     Status: Abnormal   Collection Time: 03/06/23  8:49 AM  Result Value Ref Range   Sodium 125 (L) 135 - 145 mmol/L    Comment: Performed at [facility], [address] [place], [place] [address]  Flow cytometry panel-leukemia/lymphoma work-up     Status: None   Collection Time: 03/06/23  8:49 AM  Result Value Ref Range   PATH INTERP XXX-IMP Comment     Comment: No significant immunophenotypic abnormality detected.   CLINICAL INFO Comment     Comment: (NOTE) Accompanying CBC dated 03/06/2023 shows: WBC count 11.3, Neu 9.2, Lym 1.2, Mon 0.6    Specimen Type Comment     Comment: Peripheral blood   ASSESSMENT OF LEUKOCYTES Comment     Comment: (NOTE) No monoclonal B cell population is detected. kappa:lambda ratio 2.0 There is no loss of, or aberrant expression of, the pan T cell antigens to suggest a neoplastic T cell process. CD4:CD8 ratio 5.2 No circulating blasts are detected. Rare granulocytes show left shifted maturation. Monocytes show aberrant expression of CD56 in a subset and downregulation of CD11c, a nonspecific finding that can be seen in association with both reactive/activated processes as well as neoplastic processes. Analysis of the leukocyte population shows: granulocytes 90%, monocytes 5%, lymphocytes 5%, blasts <0.1%, B cells 1%, T cells 4%, NK cells <1%    % Viable Cells Comment     Comment: 98%   ANALYSIS AND  GATING STRATEGY Comment     Comment:  8 color analysis with CD45/SCC gating   IMMUNOPHENOTYPING STUDY Comment     Comment: (NOTE) CD2       Normal         CD3       Normal CD4       Normal         CD5       Normal CD7       Normal         CD8       Normal CD10      Normal         CD11b     Normal CD13      Normal         CD14      Normal CD16      Normal  CD19      Normal CD20      Normal         CD33      Normal CD34      Normal         CD38      Normal CD45      Normal         CD56      See Text CD57      Normal         CD117     Normal HLA-DR    Normal         KAPPA     Normal LAMBDA    Normal         CD64      Normal CD11c     See Text    PATHOLOGIST NAME Comment     Comment: Sumner Boast, M.D.   COMMENT: Comment     Comment: (NOTE) Each antibody in this assay was utilized to assess for potential abnormalities of studied cell populations or to characterize identified abnormalities. This test was developed and its performance characteristics determined by [facility].  It has not been cleared or approved by the U.S. Food and Drug Administration. The FDA has determined that such clearance or approval is not necessary. This test is used for clinical purposes.  It should not be regarded as investigational or for research. Performed At: -[facility] [address] [place], [place] [id number] Maurine Simmering MDPhD WU:[id number] Performed At: [facility] [address] [place], [place] [id number] Maurine Simmering MDPhD [id number]   Magnesium     Status: Abnormal   Collection Time: 03/06/23  8:49 AM  Result Value Ref Range   Magnesium 2.5 (H) 1.7 - 2.4 mg/dL    Comment: Performed at [facility], [address] [place], [place] [address]  Phosphorus     Status: None   Collection Time: 03/06/23  8:49 AM  Result Value Ref Range   Phosphorus 4.3 2.5 - 4.6 mg/dL    Comment: Performed at [facility], [address] [place], [place] [address]  Cytology  - Non PAP;     Status: None   Collection Time: 03/06/23  9:34 AM  Result Value Ref Range   CYTOLOGY - NON GYN      CYTOLOGY - NON PAP CASE: [id number] PATIENT: Cid Dimitri Non-Gynecological Cytology Report     Specimen Submitted: A. Pericardial fluid  Clinical History: None provided    DIAGNOSIS: A. PERICARDIAL FLUID; PERICARDIOCENTESIS: - POSITIVE FOR MALIGNANCY. - METASTATIC ADENOCARCINOMA, COMPATIBLE WITH PULMONARY ADENOCARCINOMA.  Comment: Immunohistochemical studies show tumor cells to be positive for CK7 and TTF-1, and negative for p40. These findings confirm the above diagnosis.   There is sufficient tissue within the cell block for ancillary testing.  Slides reviewed: 1 ThinPrep, 1 cell block, 2 cytospin  IHC slides were prepared by [facility], [facility], [place]. All controls stained appropriately.  This test was developed and its performance characteristics determined by [facility]. It has not been cleared or approved by the [place] Food and Drug Administration. The FDA does not require this test to go through premarket FDA review. Th is test is used for clinical purposes. It should not be regarded as investigational or for research. This laboratory is certified under the Clinical Laboratory Improvement Amendments (CLIA) as qualified to perform high complexity clinical laboratory testing.  GROSS DESCRIPTION: A. Labeled: Labeled with the  patient's name and date of birth (per requisition pericardial fluid) Received: Fresh Collection time: 10:22 AM on 03/06/2023 Placed into formalin time: Not applicable Volume: Approximately 15 mL Description of fluid and container in which it is received: Received in a clear plastic syringe is dark red, opaque fluid Cytospin slide(s) received: Yes, 2  Specimen material submitted for: Cell block and ThinPrep  The cell block material is fixed in formalin for 6 hours prior to processing.  CM  03/06/2023  Final Diagnosis performed by Katherine Mantle, MD.   Electronically signed 03/08/2023 1:50:11PM The electronic signature indicates that the named Attending Pathologist has  evaluated the specimen Technical component performed at [facility], [address] [place], [place] [address] Lab: [phone] Dir: Jolene Schimke, MD, MMM  Professional component performed at [facility], [facility], [address] [place], [place], [place] [address] Lab: [phone] Dir: Beryle Quant, MD   Protein, body fluid (other)     Status: None   Collection Time: 03/06/23  9:34 AM  Result Value Ref Range   Total Protein, Body Fluid Other 5.6 g/dL    Comment: (NOTE)             _________________________________________            : BODY FLUID TYPE :     TOTAL PROTEIN     :            :_________________:_______________________:            : Amniotic Fluid  :               <0.4    :            :_________________:_______________________:            :                 : Nonmalignant: <3.0    :            : Ascitic Fluid   : Malignant:    >3.0    :            :_________________:_______________________:            : Bile, Clear     :               <0.9    :            :_________________:_______________________:            : Bile, Yellow    :          0.2 - 0.6    :            :_________________:_______________________:            : Lymph           :          2.2 - 6.0    :            :_________________:_______________________:            : Human Milk      :          1.9 - 2.0    :            :_________________: ______________________:            : Nasal Secretion :          0.1 - 3.5    :            :  _________________:___________ ____________:            : Pancreatic      :          0.0 - 0.1    :            : Juice           :    (post stimulation) :            :_________________:_______________________:            :                 : Transudate:   <0.3    :            : Pleural Fluid   :  Exudate:      >0.3    :            :_________________:_______________________:            : Saliva          :          0.1 - 0.2    :            : (Mixed Glands)  :                       :            :_________________:_______________________:            : Synovial Fluid  :               <2.5    :            :_________________:_______________________:            : Tears           :          0.8 - 0.9    :            :_________________:_______________________:             Aliene Altes, Merrie Roof Reference Intervals             for  Adults and Children 2008. Ninth             Edition (V9.1) [employer],             [place]; [place]: July 2009. Performed At: [facility] [address] [place], [place] [id number] Jolene Schimke MD WU:[id number]    Source of Sample PERICARDIAL     Comment: Performed at [facility], [address] [place], [place] [address]  LD, Body Fluid (other)     Status: None   Collection Time: 03/06/23  9:34 AM  Result Value Ref Range   Source of Sample PERICARDIAL     Comment: Performed at [facility], [address] [place], [place] [address]   LD, Body Fluid 654 IU/L    Comment: (NOTE)             _________________________________________            : BODY FLUID TYPE :          LDH          :            :_________________:_______________________:            :                 :  Nonmalignant:  < 60%  :            :                 :  of the serum LDH     :            : Ascitic Fluid   : Malignant:     > 60%  :            :                 :  of the serum LDH     :            :_________________:_______________________:            : Gastric Juice   :                < 35   :            :_________________:_______________________:            :                 : Transudate:    <200   :            : Pleural Fluid   : Exudate:       >200   :            :_________________:_______________________:            : Saliva           :           113 - 609   :            : (Mixed Glands)  :                       :            :_________________:_______________________:            : Synovial Fluid  :                <240   :            :_________________:___________ ____________:             Aliene Altes, Merrie Roof. Reference Intervals             for Adults and Children 2008. Ninth             Edition (V9.1) [employer],             [place]; [place]: July 2009. Performed At: [facility] [address] [place], [place] [id number] Jolene Schimke MD WU:[id number]   Body fluid culture w Gram Stain     Status: None   Collection Time: 03/06/23  9:34 AM   Specimen: PATH Cytology Misc. fluid; Body Fluid  Result Value Ref Range   Specimen Description      PERICARDIAL Performed at [facility], [address] [place], [place] [address]    Special Requests      PERICARDIAL Performed at [facility], [address] [place], [place] [address]    Gram Stain NO WBC SEEN NO ORGANISMS SEEN     Culture      NO GROWTH 3 DAYS Performed at [facility], [address], [place], [place] [address]    Report Status 03/10/2023 FINAL   Body  fluid cell count with differential     Status: Abnormal   Collection Time: 03/06/23 10:22 AM  Result Value Ref Range   Fluid Type-FCT PERICARDIAL     Comment: CORRECTED ON 05/08 AT 1100: PREVIOUSLY REPORTED AS CYTO MISC   Color, Fluid RED (A) YELLOW   Appearance, Fluid BLOODY (A) CLEAR   Total Nucleated Cell Count, Fluid 3,883 cu mm   Neutrophil Count, Fluid 46 %   Lymphs, Fluid 24 %   Monocyte-Macrophage-Serous Fluid 26 %   Eos, Fluid 4 %    Comment: Performed at [facility], [address] [place], [place] [address]  Glucose, Body Fluid Other     Status: None   Collection Time: 03/06/23 10:22 AM  Result Value Ref Range   Glucose, Body Fluid Other 23 mg/dL    Comment: (NOTE)              _________________________________________            : BODY FLUID TYPE :        GLUCOSE        :            :_________________:_______________________:            : Amniotic Fluid  :        45 - 76        :            :_________________:_______________________:            : Bile, Clear     :            < 5        :            :_________________:_______________________:            : Bile, Yellow    :            < 8        :            :_________________:_______________________:            : Lymph           :        48 - 200       :            :_________________:_______________________:            : Nasal Secretion :            < 10       :            :_________________:_______________________:            : Pleural Fluid   :        65 -  99       :            :_________________:_______________________:            : Saliva          :            <  2       :            : (Mixed Glands)  :                       :            :_________________:___________ ____________:            :  Sweat           :            <  7       :            :_________________:_______________________:            : Synovial Fluid  :        65 -  99       :            :_________________:_______________________:            : Tears           :        76 - 288       :            :_________________:_______________________:             Aliene Altes, Ehrhardt V. Reference Intervals             for Adults and Children 2008. Ninth             edition (V9.1) [employer],             [place]; [place]: July 2009. Performed At: [facility] [address] [place], [place] [id number] Jolene Schimke MD WU:[id number]    Source of Sample PERICARDIAL     Comment: Performed at [facility], [address] [place], [place] [address]  ECHOCARDIOGRAM COMPLETE     Status: None   Collection Time: 03/06/23 10:47 AM  Result Value Ref Range   Weight 1,587.31 oz   Height 60 in   BP 135/75 mmHg   S' Lateral  3.10 cm   Est EF 60 - 65%   Sodium     Status: Abnormal   Collection Time: 03/06/23  2:55 PM  Result Value Ref Range   Sodium 126 (L) 135 - 145 mmol/L    Comment: Performed at [facility], [address] [place], [place] [address]  Sodium     Status: Abnormal   Collection Time: 03/06/23  6:06 PM  Result Value Ref Range   Sodium 130 (L) 135 - 145 mmol/L    Comment: Performed at [facility], [address] [place], [place] [address]  Sodium     Status: Abnormal   Collection Time: 03/06/23 10:50 PM  Result Value Ref Range   Sodium 129 (L) 135 - 145 mmol/L    Comment: Performed at [facility], [address], [place], [place] [address]  Basic metabolic panel     Status: Abnormal   Collection Time: 03/07/23  3:08 AM  Result Value Ref Range   Sodium 130 (L) 135 - 145 mmol/L   Potassium 4.5 3.5 - 5.1 mmol/L   Chloride 102 98 - 111 mmol/L   CO2 18 (L) 22 - 32 mmol/L   Glucose, Bld 100 (H) 70 - 99 mg/dL    Comment: Glucose reference range applies only to samples taken after fasting for at least 8 hours.   BUN 43 (H) 8 - 23 mg/dL   Creatinine, Ser 8.41 0.61 - 1.24 mg/dL   Calcium 8.0 (L) 8.9 - 10.3 mg/dL   GFR, Estimated >32 >44 mL/min    Comment: (NOTE) Calculated using the CKD-EPI Creatinine Equation (2021)    Anion gap 10 5 - 15    Comment: Performed at [facility], [address], [place], [place] [address]  CBC  Status: Abnormal   Collection Time: 03/07/23  3:08 AM  Result Value Ref Range   WBC 14.2 (H) 4.0 - 10.5 K/uL   RBC 3.20 (L) 4.22 - 5.81 MIL/uL   Hemoglobin 10.0 (L) 13.0 - 17.0 g/dL   HCT 16.1 (L) 09.6 - 04.5 %   MCV 96.9 80.0 - 100.0 fL   MCH 31.3 26.0 - 34.0 pg   MCHC 32.3 30.0 - 36.0 g/dL   RDW 40.9 81.1 - 91.4 %   Platelets 274 150 - 400 K/uL   nRBC 0.1 0.0 - 0.2 %    Comment: Performed at [facility], [address], [place], [place] [address]  Hepatic function panel     Status: Abnormal   Collection  Time: 03/07/23  3:08 AM  Result Value Ref Range   Total Protein 5.5 (L) 6.5 - 8.1 g/dL   Albumin 2.3 (L) 3.5 - 5.0 g/dL   AST 621 (H) 15 - 41 U/L   ALT 142 (H) 0 - 44 U/L   Alkaline Phosphatase 59 38 - 126 U/L   Total Bilirubin 0.7 0.3 - 1.2 mg/dL   Bilirubin, Direct 0.1 0.0 - 0.2 mg/dL   Indirect Bilirubin 0.6 0.3 - 0.9 mg/dL    Comment: Performed at [facility], [address] [place], [place] [address]  Magnesium     Status: Abnormal   Collection Time: 03/07/23  3:08 AM  Result Value Ref Range   Magnesium 2.5 (H) 1.7 - 2.4 mg/dL    Comment: Performed at [facility], [address], [place], [place] [address]  Phosphorus     Status: None   Collection Time: 03/07/23  3:08 AM  Result Value Ref Range   Phosphorus 4.5 2.5 - 4.6 mg/dL    Comment: Performed at [facility], [address] [place], [place] [address]  Sodium     Status: Abnormal   Collection Time: 03/07/23  7:19 AM  Result Value Ref Range   Sodium 129 (L) 135 - 145 mmol/L    Comment: Performed at [facility], [address] [place], [place] [address]  ECHOCARDIOGRAM LIMITED     Status: None   Collection Time: 03/07/23  8:23 AM  Result Value Ref Range   Weight 1,724.88 oz   Height 60 in   BP 92/60 mmHg   S' Lateral 2.80 cm   Area-P 1/2 4.99 cm2   P 1/2 time 436 msec   Est EF 60 - 65%   Blood gas, arterial     Status: Abnormal   Collection Time: 03/07/23 11:59 AM  Result Value Ref Range   O2 Content 4.0 L/min   pH, Arterial 7.45 7.35 - 7.45   pCO2 arterial 29 (L) 32 - 48 mmHg   pO2, Arterial 87 83 - 108 mmHg   Bicarbonate 20.2 20.0 - 28.0 mmol/L   Acid-base deficit 2.6 (H) 0.0 - 2.0 mmol/L   O2 Saturation 98.2 %   Patient temperature 37.0    Collection site LEFT BRACHIAL     Comment: Performed at [facility], [address] [place], [place] [address]  Sodium     Status: Abnormal   Collection Time: 03/07/23 12:21 PM  Result Value Ref Range   Sodium  131 (L) 135 - 145 mmol/L    Comment: Performed at [facility], [address] [place], [place] [address]  Pleural fluid culture w Gram Stain     Status: None   Collection Time:  03/07/23  4:00 PM   Specimen: Pleural Fluid  Result Value Ref Range   Specimen Description      PLEURAL Performed at [facility], [address] [place], [place] [address]    Special Requests      NONE Performed at [facility], [address] [place], [place] [address]    Gram Stain      FEW WBC PRESENT,BOTH PMN AND MONONUCLEAR NO ORGANISMS SEEN    Culture      NO GROWTH 3 DAYS Performed at [facility], [address] [place], [place] [address]    Report Status 03/11/2023 FINAL   Pleural fluid cell count with differential     Status: Abnormal   Collection Time: 03/07/23  4:00 PM  Result Value Ref Range   Fluid Type-FCT PLEURAL    Color, Fluid ORANGE (A) YELLOW   Appearance, Fluid CLOUDY (A) CLEAR   Total Nucleated Cell Count, Fluid 588 cu mm   Neutrophil Count, Fluid 25 %   Lymphs, Fluid 49 %   Monocyte-Macrophage-Serous Fluid 26 %   Eos, Fluid 0 %    Comment: Performed at [facility], [address] [place], [place] [address]  Glucose, pleural fluid     Status: None   Collection Time: 03/07/23  4:00 PM  Result Value Ref Range   Glucose, Fluid 94 mg/dL    Comment: (NOTE) No normal range established for this test Results should be evaluated in conjunction with serum values    Fluid Type-FGLU PLEURAL     Comment: Performed at [facility], [address], [place], [place] [address]  Protein, pleural fluid     Status: None   Collection Time: 03/07/23  4:00 PM  Result Value Ref Range   Total protein, fluid <3.0 g/dL    Comment: (NOTE) No normal range established for this test Results should be evaluated in conjunction with serum values    Fluid Type-FTP PLEURAL     Comment: Performed at [facility], [address] [place], [place] [address]  Lactate dehydrogenase, pleural fluid     Status: Abnormal   Collection Time: 03/07/23  4:00 PM  Result Value Ref Range   LD, Fluid 114 (H) 3 - 23 U/L    Comment: (NOTE) Results should be evaluated in conjunction with serum values    Fluid Type-FLDH PLEURAL     Comment: Performed at [facility], [address] [place], [place] [address]  Amylase, pleural fluid     Status: None   Collection Time: 03/07/23  4:00 PM  Result Value Ref Range   Amylase, Fluid 7 U/L    Comment: NO NORMAL RANGE ESTABLISHED FOR THIS TEST   Fluid Type-FAMY PLEURAL     Comment: Performed at [facility], [address] [place], [place] [address]  Triglycerides, Pleural Fluid     Status: None   Collection Time: 03/07/23  4:00 PM  Result Value Ref Range   Triglycerides, Fluid 15 Not Estab. mg/dL    Comment: (NOTE) The reference interval(s) and other method performance specifications have not been established for this body fluid. The test result must be integrated into the clinical context for interpretation. Performed At: [facility] [address] [place], [place] [id number] Jolene Schimke MD YQ:[id number]    Fluid Type-FTRIG PLEURAL     Comment: Performed at [facility], [address] [place], [place] [address]  Cholesterol, pleural fluid  Status: None   Collection Time: 03/07/23  4:00 PM  Result Value Ref Range   Cholesterol, Fluid 30 mg/dL    Comment: (NOTE) INTERPRETIVE INFORMATION: Cholesterol, Body Fluid For information on body fluid reference ranges and/or interpretive guidance visit [email or web address] This test was developed and its performance characteristics determined by [employer]. It has not been cleared or approved by the [place] Food and Drug Administration. This test was performed in a CLIA certified laboratory and is intended for clinical purposes. Performed At: [facility] [address] [place], [place] [id number] Evans Lance MDPhD WG:[id number]    Chol, Fluid Type PLEURAL     Comment: Performed at [facility], [address] [place], [place] [address]  Sodium     Status: Abnormal   Collection Time: 03/07/23  4:01 PM  Result Value Ref Range   Sodium 131 (L) 135 - 145 mmol/L    Comment: Performed at [facility], [address] [place], [place] [address]  Cytology - Non PAP; pleural fluid     Status: None   Collection Time: 03/07/23  4:02 PM  Result Value Ref Range   CYTOLOGY - NON GYN      CYTOLOGY - NON PAP CASE: [id number] PATIENT: Tarron Haislip Non-Gynecological Cytology Report     Specimen Submitted: A. Pleural fluid, right  Clinical History: 69M presents with bilateral pleural effusions    DIAGNOSIS: A. PLEURAL FLUID, RIGHT; ULTRASOUND-GUIDED THORACENTESIS: - NEGATIVE FOR MALIGNANCY. - BLOOD, MIXED INFLAMMATORY CELLS, AND ABUNDANT MACROPHAGES.  Comment: Given the patient's previous positive pericardial fluid evaluation ([id number]), as well as focal atypical cells in a background of significant inflammation, immunohistochemical studies were performed. Stains for BerEP4 and TTF-1 are negative. There is no definite immunophenotypic evidence of metastatic adenocarcinoma in this sampled pleural fluid.  IHC slides were prepared by [facility] for Molecular Biology and Pathology, RTP, . All controls stained appropriately.  This test was developed and its performance characteristics determined by [facility]. It has not been cleared or approve d by the [place] Food and Drug Administration. The FDA does not require this test to go through premarket FDA review. This test is used for clinical purposes. It should not be regarded as investigational or for research. This laboratory is certified under the Clinical Laboratory Improvement Amendments (CLIA) as qualified to perform high complexity clinical laboratory  testing.  GROSS DESCRIPTION: A. Labeled: Received labeled the patient's name date of birth (per requisition pleural fluid, right pleural effusion) Received: Fresh Collection time: 4:02 PM on 03/07/2023 Placed into formalin time: Not applicable Volume: Approximately 80 mL Description of fluid and container in which it is received: Received in a clear plastic specimen container with a white screw top lid is yellow-red cloudy fluid. Cytospin slide(s) received: 1  Specimen material submitted for: Cell block and ThinPrep  The cell block material is fixed in formalin for 6 hours prior to processing.  RB 5/10/202 4  Final Diagnosis performed by Katherine Mantle, MD.   Electronically signed 03/12/2023 12:49:09PM The electronic signature indicates that the named Attending Pathologist has evaluated the specimen Technical component performed at [facility], [address], [place], [place] [address] Lab: [phone] Dir: Jolene Schimke, MD, MMM  Professional component performed at [facility], [address] [place], [place], [place] [address] Lab: [phone] Dir: Beryle Quant, MD   Miscellaneous [facility] test (send-out)     Status: None   Collection Time: 03/07/23  4:02 PM  Result Value Ref Range  [facility] test code [phone]     Comment: Performed at [facility], [address], [place], [place] [address]   [facility] test name ADA DEAMINASE PL FLUID     Comment: Performed at [facility], [address], [place], [place] [address]   Misc [facility] result See Scanned report in [employer] Link     Comment: Performed at [facility], [address] [place], [place] [address]  Acid Fast Smear (AFB)     Status: None   Collection Time: 03/07/23  4:06 PM   Specimen: Pleural, Right; Pleural Fluid  Result Value Ref Range   AFB Specimen Processing Concentration    Acid Fast Smear Negative     Comment: (NOTE) Performed At: [facility] [address] [place], [place] [id number] Jolene Schimke MD VH:[id number]    Source (AFB) PLEURAL     Comment: Performed at [facility], [address] [place], [place] [address]  Culture, fungus without smear     Status: None   Collection Time: 03/07/23  4:06 PM   Specimen: Pleural Fluid  Result Value Ref Range   Specimen Description      PLEURAL Performed at [facility], [address] [place], [place] [address]    Special Requests      NONE Performed at [facility], [address] [place], [place] [address]    Culture      NO FUNGUS ISOLATED AFTER 21 DAYS Performed at [facility], [address] [place], [place] [address]    Report Status 03/28/2023 FINAL   Hematocrit, Pleural Fluid     Status: Abnormal   Collection Time: 03/07/23  4:28 PM  Result Value Ref Range   HCT 32.6 (L) 39.0 - 52.0 %    Comment: Performed at [facility], [address] [place], [place] [address]  Albumin, Pleural Fluid     Status: Abnormal   Collection Time: 03/07/23  4:28 PM  Result Value Ref Range   Albumin 2.4 (L) 3.5 - 5.0 g/dL    Comment: Performed at [facility], [address] [place], [place] [address]  Cryptococcus Antigen, Serum     Status: None   Collection Time: 03/07/23  4:28 PM  Result Value Ref Range   Cryptococcus Antigen, Serum Negative Negative    Comment: (NOTE) Performed At: [facility] [address] [place], [place] [id number] Jolene Schimke MD JO:[id number]   Lactate dehydrogenase     Status: Abnormal   Collection Time: 03/07/23  4:28 PM  Result Value Ref Range   LDH 282 (H) 98 - 192 U/L    Comment: Performed at [facility], [address] [place], [place] [address]  Protein, total     Status: Abnormal   Collection Time: 03/07/23  4:28 PM  Result Value Ref Range   Total Protein 5.8 (L) 6.5 - 8.1 g/dL    Comment: Performed at [facility], [address] [place], [place]  [address]  Strep pneumoniae urinary antigen     Status: None   Collection Time: 03/07/23  6:43 PM  Result Value Ref Range   Strep Pneumo Urinary Antigen NEGATIVE NEGATIVE    Comment:        Infection due to S. pneumoniae cannot be absolutely ruled out since the antigen present may be below the detection limit of the test. Performed at [facility], [address], [place], [place] [address]  Legionella Pneumophila Serogp 1 Ur Ag     Status: None   Collection Time: 03/07/23  6:43 PM  Result Value Ref Range   L. pneumophila Serogp 1 Ur Ag Negative Negative    Comment: (NOTE) Presumptive negative for L. pneumophila serogroup 1 antigen in urine, suggesting no recent or current infection. Legionnaires' disease cannot be ruled out since other serogroups and species may also cause disease. Performed At: [facility] [address] [place], [place] [id number] Jolene Schimke MD NW:[id number]    Source of Sample URINE, RANDOM     Comment: Performed at [facility], [address] [place], [place] [address]  Basic metabolic panel     Status: Abnormal   Collection Time: 03/08/23  5:39 AM  Result Value Ref Range   Sodium 134 (L) 135 - 145 mmol/L   Potassium 3.7 3.5 - 5.1 mmol/L   Chloride 107 98 - 111 mmol/L   CO2 23 22 - 32 mmol/L   Glucose, Bld 73 70 - 99 mg/dL    Comment: Glucose reference range applies only to samples taken after fasting for at least 8 hours.   BUN 28 (H) 8 - 23 mg/dL   Creatinine, Ser 9.62 0.61 - 1.24 mg/dL   Calcium 7.7 (L) 8.9 - 10.3 mg/dL   GFR, Estimated >95 >28 mL/min    Comment: (NOTE) Calculated using the CKD-EPI Creatinine Equation (2021)    Anion gap 4 (L) 5 - 15    Comment: Performed at [facility], [address] [place], [place] [address]  CBC     Status: Abnormal   Collection Time: 03/08/23  5:39 AM  Result Value Ref Range   WBC 12.1 (H) 4.0 - 10.5 K/uL   RBC 3.13 (L) 4.22 - 5.81 MIL/uL   Hemoglobin 9.7 (L)  13.0 - 17.0 g/dL   HCT 40.1 (L) 02.7 - 25.3 %   MCV 97.1 80.0 - 100.0 fL   MCH 31.0 26.0 - 34.0 pg   MCHC 31.9 30.0 - 36.0 g/dL   RDW 66.4 40.3 - 47.4 %   Platelets 260 150 - 400 K/uL   nRBC 0.0 0.0 - 0.2 %    Comment: Performed at [facility], [address] [place], [place] [address]  Hepatic function panel     Status: Abnormal   Collection Time: 03/08/23  5:39 AM  Result Value Ref Range   Total Protein 5.2 (L) 6.5 - 8.1 g/dL   Albumin 2.2 (L) 3.5 - 5.0 g/dL   AST 90 (H) 15 - 41 U/L   ALT 122 (H) 0 - 44 U/L   Alkaline Phosphatase 52 38 - 126 U/L   Total Bilirubin 0.7 0.3 - 1.2 mg/dL   Bilirubin, Direct 0.1 0.0 - 0.2 mg/dL   Indirect Bilirubin 0.6 0.3 - 0.9 mg/dL    Comment: Performed at [facility], [address] [place], [place] [address]  Magnesium     Status: None   Collection Time: 03/08/23  5:39 AM  Result Value Ref Range   Magnesium 2.3 1.7 - 2.4 mg/dL    Comment: Performed at [facility], [address] [place], [place] [address]  Phosphorus     Status: None   Collection Time: 03/08/23  5:39 AM  Result Value Ref Range   Phosphorus 3.1 2.5 - 4.6 mg/dL    Comment: Performed at [facility], [address] [place], [place] [address]  Basic metabolic panel     Status:  Abnormal   Collection Time: 03/09/23  4:13 AM  Result Value Ref Range   Sodium 133 (L) 135 - 145 mmol/L   Potassium 3.9 3.5 - 5.1 mmol/L   Chloride 107 98 - 111 mmol/L   CO2 22 22 - 32 mmol/L   Glucose, Bld 129 (H) 70 - 99 mg/dL    Comment: Glucose reference range applies only to samples taken after fasting for at least 8 hours.   BUN 20 8 - 23 mg/dL   Creatinine, Ser 4.09 0.61 - 1.24 mg/dL   Calcium 8.1 (L) 8.9 - 10.3 mg/dL   GFR, Estimated >81 >19 mL/min    Comment: (NOTE) Calculated using the CKD-EPI Creatinine Equation (2021)    Anion gap 4 (L) 5 - 15    Comment: Performed at [facility], [address] [place], [place] [address]  CBC      Status: Abnormal   Collection Time: 03/09/23  4:13 AM  Result Value Ref Range   WBC 11.5 (H) 4.0 - 10.5 K/uL   RBC 3.38 (L) 4.22 - 5.81 MIL/uL   Hemoglobin 10.4 (L) 13.0 - 17.0 g/dL   HCT 95.6 (L) 21.3 - 08.6 %   MCV 95.9 80.0 - 100.0 fL   MCH 30.8 26.0 - 34.0 pg   MCHC 32.1 30.0 - 36.0 g/dL   RDW 57.8 46.9 - 62.9 %   Platelets 281 150 - 400 K/uL   nRBC 0.0 0.0 - 0.2 %    Comment: Performed at [facility], [address] [place], [place] [address]  Hepatic function panel     Status: Abnormal   Collection Time: 03/09/23  4:13 AM  Result Value Ref Range   Total Protein 5.6 (L) 6.5 - 8.1 g/dL   Albumin 2.2 (L) 3.5 - 5.0 g/dL   AST 75 (H) 15 - 41 U/L   ALT 117 (H) 0 - 44 U/L   Alkaline Phosphatase 63 38 - 126 U/L   Total Bilirubin 0.7 0.3 - 1.2 mg/dL   Bilirubin, Direct 0.1 0.0 - 0.2 mg/dL   Indirect Bilirubin 0.6 0.3 - 0.9 mg/dL    Comment: Performed at [facility], [address] [place], [place] [address]  Magnesium     Status: None   Collection Time: 03/09/23  4:13 AM  Result Value Ref Range   Magnesium 2.2 1.7 - 2.4 mg/dL    Comment: Performed at [facility], [address] [place], [place] [address]  Phosphorus     Status: None   Collection Time: 03/09/23  4:13 AM  Result Value Ref Range   Phosphorus 3.5 2.5 - 4.6 mg/dL    Comment: Performed at [facility], [address] [place], [place] [address]  Iron and TIBC     Status: Abnormal   Collection Time: 03/09/23  4:13 AM  Result Value Ref Range   Iron 41 (L) 45 - 182 ug/dL   TIBC 403 (L) 474 - 259 ug/dL   Saturation Ratios 17 (L) 17.9 - 39.5 %   UIBC 198 ug/dL    Comment: Performed at [facility], [address] [place], [place] [address]  Folate     Status: None   Collection Time: 03/09/23  4:13 AM  Result Value Ref Range   Folate 11.8 >5.9 ng/mL    Comment: Performed at [facility], [address] [place], [place] [address]  Vitamin B12      Status: Abnormal   Collection Time:  03/10/23  5:40 AM  Result Value Ref Range   Vitamin B-12 955 (H) 180 - 914 pg/mL    Comment: (NOTE) This assay is not validated for testing neonatal or myeloproliferative syndrome specimens for Vitamin B12 levels. Performed at [facility], [address] [place], [place] [address]   VITAMIN D 25 Hydroxy (Vit-D Deficiency, Fractures)     Status: Abnormal   Collection Time: 03/10/23  5:40 AM  Result Value Ref Range   Vit D, 25-Hydroxy 16.45 (L) 30 - 100 ng/mL    Comment: (NOTE) Vitamin D deficiency has been defined by the Institute of Medicine  and an Endocrine Society practice guideline as a level of serum 25-OH  vitamin D less than 20 ng/mL (1,2). The Endocrine Society went on to  further define vitamin D insufficiency as a level between 21 and 29  ng/mL (2).  1. IOM (Institute of Medicine). 2010. Dietary reference intakes for  calcium and D. [place]: [employer]. 2. Holick MF, Binkley [place], Bischoff-Ferrari HA, et al. Evaluation,  treatment, and prevention of vitamin D deficiency: an Endocrine  Society clinical practice guideline, JCEM. 2011 Jul; 96(7): 1911-30.  Performed at [facility], [address] [place], [place] [address]   Basic metabolic panel     Status: Abnormal   Collection Time: 03/10/23  5:40 AM  Result Value Ref Range   Sodium 130 (L) 135 - 145 mmol/L   Potassium 3.8 3.5 - 5.1 mmol/L   Chloride 103 98 - 111 mmol/L   CO2 23 22 - 32 mmol/L   Glucose, Bld 122 (H) 70 - 99 mg/dL    Comment: Glucose reference range applies only to samples taken after fasting for at least 8 hours.   BUN 16 8 - 23 mg/dL   Creatinine, Ser 1.91 0.61 - 1.24 mg/dL   Calcium 7.8 (L) 8.9 - 10.3 mg/dL   GFR, Estimated >47 >82 mL/min    Comment: (NOTE) Calculated using the CKD-EPI Creatinine Equation (2021)    Anion gap 4 (L) 5 - 15    Comment: Performed at [facility], [address] [place],  [place] [address]  CBC     Status: Abnormal   Collection Time: 03/10/23  5:40 AM  Result Value Ref Range   WBC 11.4 (H) 4.0 - 10.5 K/uL   RBC 3.35 (L) 4.22 - 5.81 MIL/uL   Hemoglobin 10.2 (L) 13.0 - 17.0 g/dL   HCT 30.8 (L) 65.7 - 84.6 %   MCV 95.8 80.0 - 100.0 fL   MCH 30.4 26.0 - 34.0 pg   MCHC 31.8 30.0 - 36.0 g/dL   RDW 96.2 95.2 - 84.1 %   Platelets 270 150 - 400 K/uL   nRBC 0.0 0.0 - 0.2 %    Comment: Performed at [facility], [address] [place], [place] [address]  Hepatic function panel     Status: Abnormal   Collection Time: 03/10/23  5:40 AM  Result Value Ref Range   Total Protein 5.4 (L) 6.5 - 8.1 g/dL   Albumin 2.1 (L) 3.5 - 5.0 g/dL   AST 76 (H) 15 - 41 U/L   ALT 100 (H) 0 - 44 U/L   Alkaline Phosphatase 59 38 - 126 U/L   Total Bilirubin 0.6 0.3 - 1.2 mg/dL   Bilirubin, Direct 0.1 0.0 - 0.2 mg/dL   Indirect Bilirubin 0.5 0.3 - 0.9 mg/dL    Comment: Performed at [facility],  [address], [place], [place] [address]  Magnesium     Status: None   Collection Time: 03/10/23  5:40 AM  Result Value Ref Range   Magnesium 2.2 1.7 - 2.4 mg/dL    Comment: Performed at [facility], [address] [place], [place] [address]  Phosphorus     Status: None   Collection Time: 03/10/23  5:40 AM  Result Value Ref Range   Phosphorus 3.2 2.5 - 4.6 mg/dL    Comment: Performed at [facility], [address] [place], [place] [address]  Basic metabolic panel     Status: Abnormal   Collection Time: 03/11/23  5:42 AM  Result Value Ref Range   Sodium 133 (L) 135 - 145 mmol/L   Potassium 4.2 3.5 - 5.1 mmol/L   Chloride 104 98 - 111 mmol/L   CO2 23 22 - 32 mmol/L   Glucose, Bld 126 (H) 70 - 99 mg/dL    Comment: Glucose reference range applies only to samples taken after fasting for at least 8 hours.   BUN 17 8 - 23 mg/dL   Creatinine, Ser 9.14 0.61 - 1.24 mg/dL   Calcium 8.3 (L) 8.9 - 10.3 mg/dL   GFR, Estimated >78 >29 mL/min    Comment:  (NOTE) Calculated using the CKD-EPI Creatinine Equation (2021)    Anion gap 6 5 - 15    Comment: Performed at [facility], [address] [place], [place] [address]  CBC     Status: Abnormal   Collection Time: 03/11/23  5:42 AM  Result Value Ref Range   WBC 12.2 (H) 4.0 - 10.5 K/uL   RBC 3.60 (L) 4.22 - 5.81 MIL/uL   Hemoglobin 11.1 (L) 13.0 - 17.0 g/dL   HCT 08.6 (L) 57.8 - 46.9 %   MCV 94.7 80.0 - 100.0 fL   MCH 30.8 26.0 - 34.0 pg   MCHC 32.6 30.0 - 36.0 g/dL   RDW 62.9 52.8 - 41.3 %   Platelets 263 150 - 400 K/uL   nRBC 0.0 0.0 - 0.2 %    Comment: Performed at [facility], [address], [place], [place] [address]  Hepatic function panel     Status: Abnormal   Collection Time: 03/11/23  5:42 AM  Result Value Ref Range   Total Protein 5.3 (L) 6.5 - 8.1 g/dL   Albumin 2.0 (L) 3.5 - 5.0 g/dL   AST 59 (H) 15 - 41 U/L   ALT 88 (H) 0 - 44 U/L   Alkaline Phosphatase 64 38 - 126 U/L   Total Bilirubin 0.7 0.3 - 1.2 mg/dL   Bilirubin, Direct <0.2 0.0 - 0.2 mg/dL   Indirect Bilirubin NOT CALCULATED 0.3 - 0.9 mg/dL    Comment: Performed at [facility], [address] [place], [place] [address]  Magnesium     Status: None   Collection Time: 03/11/23  5:42 AM  Result Value Ref Range   Magnesium 2.0 1.7 - 2.4 mg/dL    Comment: Performed at [facility], [address] [place], [place] [address]  Phosphorus     Status: None   Collection Time: 03/11/23  5:42 AM  Result Value Ref Range   Phosphorus 3.8 2.5 - 4.6 mg/dL    Comment: Performed at [facility], [address], [place], [place] [address]  ECHOCARDIOGRAM LIMITED     Status: None   Collection Time: 03/11/23 11:19 AM  Result Value Ref Range  Weight 1,724.88 oz   Height 60 in   BP 124/56 mmHg   S' Lateral 3.30 cm   P 1/2 time 343 msec   Est EF 60 - 65%   Basic metabolic panel     Status: Abnormal   Collection Time: 03/12/23  4:26 AM  Result Value Ref Range    Sodium 131 (L) 135 - 145 mmol/L   Potassium 4.2 3.5 - 5.1 mmol/L   Chloride 100 98 - 111 mmol/L   CO2 25 22 - 32 mmol/L   Glucose, Bld 94 70 - 99 mg/dL    Comment: Glucose reference range applies only to samples taken after fasting for at least 8 hours.   BUN 19 8 - 23 mg/dL   Creatinine, Ser 1.61 0.61 - 1.24 mg/dL   Calcium 8.2 (L) 8.9 - 10.3 mg/dL   GFR, Estimated >09 >60 mL/min    Comment: (NOTE) Calculated using the CKD-EPI Creatinine Equation (2021)    Anion gap 6 5 - 15    Comment: Performed at [facility], [address] [place], [place] [address]  CBC     Status: Abnormal   Collection Time: 03/12/23  4:26 AM  Result Value Ref Range   WBC 12.6 (H) 4.0 - 10.5 K/uL   RBC 3.65 (L) 4.22 - 5.81 MIL/uL   Hemoglobin 11.1 (L) 13.0 - 17.0 g/dL   HCT 81.1 (L) 91.4 - 78.2 %   MCV 94.5 80.0 - 100.0 fL   MCH 30.4 26.0 - 34.0 pg   MCHC 32.2 30.0 - 36.0 g/dL   RDW 95.6 21.3 - 08.6 %   Platelets 267 150 - 400 K/uL   nRBC 0.0 0.0 - 0.2 %    Comment: Performed at [facility], [address] [place], [place] [address]  Hepatic function panel     Status: Abnormal   Collection Time: 03/12/23  4:26 AM  Result Value Ref Range   Total Protein 5.4 (L) 6.5 - 8.1 g/dL   Albumin 2.2 (L) 3.5 - 5.0 g/dL   AST 75 (H) 15 - 41 U/L   ALT 106 (H) 0 - 44 U/L   Alkaline Phosphatase 66 38 - 126 U/L   Total Bilirubin 0.5 0.3 - 1.2 mg/dL   Bilirubin, Direct <9.6 0.0 - 0.2 mg/dL   Indirect Bilirubin NOT CALCULATED 0.3 - 0.9 mg/dL    Comment: Performed at [facility], [address] [place], [place] [address]  Magnesium     Status: None   Collection Time: 03/12/23  4:26 AM  Result Value Ref Range   Magnesium 1.9 1.7 - 2.4 mg/dL    Comment: Performed at [facility], [address], [place], [place] [address]  Phosphorus     Status: None   Collection Time: 03/12/23  4:26 AM  Result Value Ref Range   Phosphorus 4.4 2.5 - 4.6 mg/dL    Comment: Performed at  [facility], [address] [place], [place] [address]  Glucose, capillary     Status: None   Collection Time: 03/19/23  1:54 PM  Result Value Ref Range   Glucose-Capillary 85 70 - 99 mg/dL    Comment: Glucose reference range applies only to samples taken after fasting for at least 8 hours.  Rad Jeralene Huff Session Summary     Status: None   Collection Time: 03/20/23  3:16 PM  Result Value Ref Range   Course ID [email or web address]    Course Intent Curative  Course Start Date 03/14/2023 11:04 AM    Session Number 1    Course First Treatment Date 03/20/2023  3:13 PM    Course Last Treatment Date 03/20/2023  3:15 PM    Course Elapsed Days 0    Reference Point ID Whole Brain DP    Reference Point Dosage Given to Date 3 Gy   Reference Point Session Dosage Given 3 Gy   Plan ID Brain_Whole    Plan Fractions Treated to Date 1    Plan Total Fractions Prescribed 10    Plan Prescribed Dose Per Fraction 3 Gy   Plan Total Prescribed Dose 30.000000 Gy   Plan Primary Reference Point Whole Brain DP   Rad Onc Aria Session Summary     Status: None   Collection Time: 03/21/23  2:59 PM  Result Value Ref Range   Course ID [email or web address]    Course Intent Curative    Course Start Date 03/14/2023 11:04 AM    Session Number 2    Course First Treatment Date 03/20/2023  3:13 PM    Course Last Treatment Date 03/21/2023  2:59 PM    Course Elapsed Days 1    Reference Point ID Whole Brain DP    Reference Point Dosage Given to Date 6 Gy   Reference Point Session Dosage Given 3 Gy   Plan ID Brain_Whole    Plan Fractions Treated to Date 2    Plan Total Fractions Prescribed 10    Plan Prescribed Dose Per Fraction 3 Gy   Plan Total Prescribed Dose 30.000000 Gy   Plan Primary Reference Point Whole Brain DP   CBC (Cancer Center Only)     Status: Abnormal   Collection Time: 03/21/23  3:13 PM  Result Value Ref Range   WBC Count 17.6 (H) 4.0 - 10.5 K/uL   RBC 3.68 (L) 4.22 - 5.81 MIL/uL   Hemoglobin 11.6 (L) 13.0 - 17.0  g/dL   HCT 16.1 (L) 09.6 - 04.5 %   MCV 95.7 80.0 - 100.0 fL   MCH 31.5 26.0 - 34.0 pg   MCHC 33.0 30.0 - 36.0 g/dL   RDW 40.9 81.1 - 91.4 %   Platelet Count 241 150 - 400 K/uL   nRBC 0.0 0.0 - 0.2 %    Comment: Performed at [facility], [address] [place], [place] [address]  Rad Jeralene Huff Session Summary     Status: None   Collection Time: 03/22/23 12:38 PM  Result Value Ref Range   Course ID [email or web address]    Course Intent Curative    Course Start Date 03/14/2023 11:04 AM    Session Number 3    Course First Treatment Date 03/20/2023  3:13 PM    Course Last Treatment Date 03/22/2023 12:37 PM    Course Elapsed Days 2    Reference Point ID Whole Brain DP    Reference Point Dosage Given to Date 9 Gy   Reference Point Session Dosage Given 3 Gy   Plan ID Brain_Whole    Plan Fractions Treated to Date 3    Plan Total Fractions Prescribed 10    Plan Prescribed Dose Per Fraction 3 Gy   Plan Total Prescribed Dose 30.000000 Gy   Plan Primary Reference Point Whole Brain DP   Rad Onc Aria Session Summary     Status: None   Collection Time: 03/26/23  3:09 PM  Result Value Ref Range   Course ID [email or web address]    Course Intent Curative  Course Start Date 03/14/2023 11:04 AM    Session Number 4    Course First Treatment Date 03/20/2023  3:13 PM    Course Last Treatment Date 03/26/2023  3:09 PM    Course Elapsed Days 6    Reference Point ID Whole Brain DP    Reference Point Dosage Given to Date 12 Gy   Reference Point Session Dosage Given 3 Gy   Plan ID Brain_Whole    Plan Fractions Treated to Date 4    Plan Total Fractions Prescribed 10    Plan Prescribed Dose Per Fraction 3 Gy   Plan Total Prescribed Dose 30.000000 Gy   Plan Primary Reference Point Whole Brain DP   Rad Onc Aria Session Summary     Status: None   Collection Time: 03/27/23  2:56 PM  Result Value Ref Range   Course ID [email or web address]    Course Intent Curative    Course Start Date 03/14/2023 11:04 AM    Session Number 5     Course First Treatment Date 03/20/2023  3:13 PM    Course Last Treatment Date 03/27/2023  2:55 PM    Course Elapsed Days 7    Reference Point ID Whole Brain DP    Reference Point Dosage Given to Date 15 Gy   Reference Point Session Dosage Given 3 Gy   Plan ID Brain_Whole    Plan Fractions Treated to Date 5    Plan Total Fractions Prescribed 10    Plan Prescribed Dose Per Fraction 3 Gy   Plan Total Prescribed Dose 30.000000 Gy   Plan Primary Reference Point Whole Brain DP   Rad Onc Aria Session Summary     Status: None   Collection Time: 03/28/23  2:41 PM  Result Value Ref Range   Course ID [email or web address]    Course Intent Curative    Course Start Date 03/14/2023 11:04 AM    Session Number 6    Course First Treatment Date 03/20/2023  3:13 PM    Course Last Treatment Date 03/28/2023  2:41 PM    Course Elapsed Days 8    Reference Point ID Whole Brain DP    Reference Point Dosage Given to Date 18 Gy   Reference Point Session Dosage Given 3 Gy   Plan ID Brain_Whole    Plan Fractions Treated to Date 6    Plan Total Fractions Prescribed 10    Plan Prescribed Dose Per Fraction 3 Gy   Plan Total Prescribed Dose 30.000000 Gy   Plan Primary Reference Point Whole Brain DP   Rad Onc Aria Session Summary     Status: None   Collection Time: 03/29/23 10:42 AM  Result Value Ref Range   Course ID [email or web address]    Course Intent Curative    Course Start Date 03/14/2023 11:04 AM    Session Number 7    Course First Treatment Date 03/20/2023  3:13 PM    Course Last Treatment Date 03/29/2023 10:42 AM    Course Elapsed Days 9    Reference Point ID Whole Brain DP    Reference Point Dosage Given to Date 21 Gy   Reference Point Session Dosage Given 3 Gy   Plan ID Brain_Whole    Plan Fractions Treated to Date 7    Plan Total Fractions Prescribed 10    Plan Prescribed Dose Per Fraction 3 Gy   Plan Total Prescribed Dose 30.000000 Gy   Plan Primary Reference Point Whole Brain DP   Rad Onc Aria  Session Summary      Status: None   Collection Time: 04/01/23  3:03 PM  Result Value Ref Range   Course ID [email or web address]    Course Intent Curative    Course Start Date 03/14/2023 11:04 AM    Session Number 8    Course First Treatment Date 03/20/2023  3:13 PM    Course Last Treatment Date 04/01/2023  3:02 PM    Course Elapsed Days 12    Reference Point ID Whole Brain DP    Reference Point Dosage Given to Date 24 Gy   Reference Point Session Dosage Given 3 Gy   Plan ID Brain_Whole    Plan Fractions Treated to Date 8    Plan Total Fractions Prescribed 10    Plan Prescribed Dose Per Fraction 3 Gy   Plan Total Prescribed Dose 30.000000 Gy   Plan Primary Reference Point Whole Brain DP   Rad Onc Aria Session Summary     Status: None   Collection Time: 04/02/23  3:51 PM  Result Value Ref Range   Course ID [email or web address]    Course Intent Curative    Course Start Date 03/14/2023 11:04 AM    Session Number 9    Course First Treatment Date 03/20/2023  3:13 PM    Course Last Treatment Date 04/02/2023  3:50 PM    Course Elapsed Days 13    Reference Point ID Whole Brain DP    Reference Point Dosage Given to Date 27 Gy   Reference Point Session Dosage Given 3 Gy   Plan ID Brain_Whole    Plan Fractions Treated to Date 9    Plan Total Fractions Prescribed 10    Plan Prescribed Dose Per Fraction 3 Gy   Plan Total Prescribed Dose 30.000000 Gy   Plan Primary Reference Point Whole Brain DP   CMP (Cancer Center only)     Status: Abnormal   Collection Time: 04/02/23  3:55 PM  Result Value Ref Range   Sodium 131 (L) 135 - 145 mmol/L   Potassium 4.7 3.5 - 5.1 mmol/L   Chloride 96 (L) 98 - 111 mmol/L   CO2 29 22 - 32 mmol/L   Glucose, Bld 86 70 - 99 mg/dL    Comment: Glucose reference range applies only to samples taken after fasting for at least 8 hours.   BUN 28 (H) 8 - 23 mg/dL   Creatinine 1.61 0.96 - 1.24 mg/dL   Calcium 8.9 8.9 - 04.5 mg/dL   Total Protein 7.3 6.5 - 8.1 g/dL   Albumin 3.1 (L) 3.5 - 5.0 g/dL   AST 35 15 - 41  U/L   ALT 67 (H) 0 - 44 U/L   Alkaline Phosphatase 88 38 - 126 U/L   Total Bilirubin 0.6 0.3 - 1.2 mg/dL   GFR, Estimated >40 >98 mL/min    Comment: (NOTE) Calculated using the CKD-EPI Creatinine Equation (2021)    Anion gap 6 5 - 15    Comment: Performed at [facility], [address] [place], [place] [address]  CBC with Differential (Cancer Center Only)     Status: Abnormal   Collection Time: 04/02/23  3:55 PM  Result Value Ref Range   WBC Count 11.8 (H) 4.0 - 10.5 K/uL   RBC 4.02 (L) 4.22 - 5.81 MIL/uL   Hemoglobin 12.3 (L) 13.0 - 17.0 g/dL   HCT 78.2 (L) 95.6 - 21.3 %   MCV 96.3 80.0 - 100.0 fL   MCH 30.6 26.0 -  34.0 pg   MCHC 31.8 30.0 - 36.0 g/dL   RDW 82.9 (H) 56.2 - 13.0 %   Platelet Count 261 150 - 400 K/uL   nRBC 0.0 0.0 - 0.2 %   Neutrophils Relative % 74 %   Neutro Abs 8.8 (H) 1.7 - 7.7 K/uL   Lymphocytes Relative 11 %   Lymphs Abs 1.3 0.7 - 4.0 K/uL   Monocytes Relative 9 %   Monocytes Absolute 1.0 0.1 - 1.0 K/uL   Eosinophils Relative 2 %   Eosinophils Absolute 0.2 0.0 - 0.5 K/uL   Basophils Relative 0 %   Basophils Absolute 0.1 0.0 - 0.1 K/uL   Immature Granulocytes 4 %   Abs Immature Granulocytes 0.48 (H) 0.00 - 0.07 K/uL    Comment: Performed at [facility], [address] [place], [place] [address]  Rad Jeralene Huff Session Summary     Status: None   Collection Time: 04/03/23  3:05 PM  Result Value Ref Range   Course ID [email or web address]    Course Intent Curative    Course Start Date 03/14/2023 11:04 AM    Session Number 10    Course First Treatment Date 03/20/2023  3:13 PM    Course Last Treatment Date 04/03/2023  3:04 PM    Course Elapsed Days 14    Reference Point ID Whole Brain DP    Reference Point Dosage Given to Date 30 Gy   Reference Point Session Dosage Given 3 Gy   Plan ID Brain_Whole    Plan Fractions Treated to Date 10    Plan Total Fractions Prescribed 10    Plan Prescribed Dose Per Fraction 3 Gy   Plan Total Prescribed Dose  30.000000 Gy   Plan Primary Reference Point Whole Brain DP   TSH     Status: None   Collection Time: 04/08/23  1:03 PM  Result Value Ref Range   TSH 1.640 0.450 - 4.500 uIU/mL  CMP14+EGFR     Status: Abnormal   Collection Time: 04/08/23  1:03 PM  Result Value Ref Range   Glucose 78 70 - 99 mg/dL   BUN 21 8 - 27 mg/dL   Creatinine, Ser 4.69 (L) 0.76 - 1.27 mg/dL   eGFR 97 >62 XB/MWU/1.32   BUN/Creatinine Ratio 31 (H) 10 - 24   Sodium 133 (L) 134 - 144 mmol/L   Potassium 4.6 3.5 - 5.2 mmol/L   Chloride 98 96 - 106 mmol/L   CO2 23 20 - 29 mmol/L   Calcium 9.0 8.6 - 10.2 mg/dL   Total Protein 6.4 6.0 - 8.5 g/dL   Albumin 3.3 (L) 3.8 - 4.8 g/dL   Globulin, Total 3.1 1.5 - 4.5 g/dL   Albumin/Globulin Ratio 1.1    Bilirubin Total <0.2 0.0 - 1.2 mg/dL   Alkaline Phosphatase 98 44 - 121 IU/L   AST 29 0 - 40 IU/L   ALT 40 0 - 44 IU/L  Lipid panel     Status: None   Collection Time: 04/08/23  1:03 PM  Result Value Ref Range   Cholesterol, Total 182 100 - 199 mg/dL   Triglycerides 83 0 - 149 mg/dL   HDL 76 >44 mg/dL   VLDL Cholesterol Cal 15 5 - 40 mg/dL   LDL Chol Calc (NIH) 91 0 - 99 mg/dL   LDL CALC COMMENT: CANCELED     Comment: Test not performed  Result canceled by the ancillary.    Chol/HDL Ratio 2.4 0.0 - 5.0  ratio    Comment:                                   T. Chol/HDL Ratio                                             Men  Women                               1/2 Avg.Risk  3.4    3.3                                   Avg.Risk  5.0    4.4                                2X Avg.Risk  9.6    7.1                                3X Avg.Risk 23.4   11.0       Assessment & Plan:  1) Lactulose 2) Follow up appt in 1 month Problem List Items Addressed This Visit       Respiratory   Chronic obstructive pulmonary disease (HCC)     Digestive   Drug-induced constipation - Primary     Other   Mixed hyperlipidemia    Return today (on 04/16/2023).   Total time spent:  35 minutes  Orson Eva, NP  04/16/2023   This document may have been prepared by [facility] Voice Recognition software and as such may include unintentional dictation errors.

## 2023-04-16 NOTE — Patient Instructions (Signed)
1) Lactulose 2) Follow up appt in 1 month

## 2023-04-18 ENCOUNTER — Ambulatory Visit (INDEPENDENT_AMBULATORY_CARE_PROVIDER_SITE_OTHER): Payer: Medicare Other | Admitting: Urology

## 2023-04-18 ENCOUNTER — Encounter: Payer: Self-pay | Admitting: Urology

## 2023-04-18 VITALS — BP 159/81 | HR 116 | Ht 59.0 in | Wt 105.0 lb

## 2023-04-18 DIAGNOSIS — N138 Other obstructive and reflux uropathy: Secondary | ICD-10-CM

## 2023-04-18 DIAGNOSIS — N401 Enlarged prostate with lower urinary tract symptoms: Secondary | ICD-10-CM | POA: Diagnosis not present

## 2023-04-18 DIAGNOSIS — R972 Elevated prostate specific antigen [PSA]: Secondary | ICD-10-CM | POA: Diagnosis not present

## 2023-04-18 LAB — BLADDER SCAN AMB NON-IMAGING

## 2023-04-18 MED ORDER — FINASTERIDE 5 MG PO TABS: 5.0000 mg | ORAL_TABLET | Freq: Every day | ORAL | 3 refills | Status: DC

## 2023-04-18 MED ORDER — TAMSULOSIN HCL 0.4 MG PO CAPS: 0.4000 mg | ORAL_CAPSULE | Freq: Every day | ORAL | 3 refills | Status: DC

## 2023-04-18 NOTE — Progress Notes (Signed)
   04/18/2023 10:39 AM   Cody [name] 08/05/1946 [id number]  Reason for visit: Follow up elevated PSA, BPH, urinary symptoms  HPI: 77 year old male who originally presented in November 2022 with an elevated PSA of 54, as well as urinary symptoms with weak stream, urgency, incontinence, and suspected overflow incontinence.  PVR has been mildly elevated ranging from 175 to 200 mL.  He was treated for suspected prostatitis, and Flomax was added with significant improvement in his urinary symptoms.  PSA decreased to 26.8 from 54 after antibiotics, but on repeat a month later remained elevated at 30 and he opted for prostate biopsy.   Prostate biopsy 11/15/2021 showed an 88 g prostate, and all cores showed only benign prostate tissue.  At our visit in September 2023 we added finasteride to see if we can improve his urinary symptoms further, as well as monitor the PSA response with his history of a negative biopsy.  PSA from February 2024 remained elevated at 35, though unclear how consistent he has been taking the finasteride.  He really denies significant urinary symptoms today and is doing well from a urologic perspective, minimal urge incontinence, no significant nocturia, PVR normal today at .  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  He is being treated by oncology.  I personally viewed and interpreted the PET scan dated 03/19/2023 showing no urologic abnormalities, no hydronephrosis, enlarged prostate, no evidence of urologic malignancy.  -Recommend continuing Flomax and finasteride(refilled), no need for further PSA screening in the setting of new diagnosis of stage IV metastatic lung cancer -Return precautions discussed at length, RTC 1 year PVR   Sondra Come, MD  [facility] [address] [place], [place] [address] [phone]

## 2023-04-19 ENCOUNTER — Inpatient Hospital Stay: Payer: Medicare Other

## 2023-04-19 ENCOUNTER — Encounter: Payer: Self-pay | Admitting: *Deleted

## 2023-04-19 ENCOUNTER — Encounter: Payer: Self-pay | Admitting: Oncology

## 2023-04-19 ENCOUNTER — Inpatient Hospital Stay (HOSPITAL_BASED_OUTPATIENT_CLINIC_OR_DEPARTMENT_OTHER): Payer: Medicare Other | Admitting: Oncology

## 2023-04-19 VITALS — BP 166/80 | HR 66 | Temp 96.5°F | Ht 59.0 in | Wt 107.0 lb

## 2023-04-19 DIAGNOSIS — C3490 Malignant neoplasm of unspecified part of unspecified bronchus or lung: Secondary | ICD-10-CM | POA: Diagnosis not present

## 2023-04-19 DIAGNOSIS — I3139 Other pericardial effusion (noninflammatory): Secondary | ICD-10-CM

## 2023-04-19 DIAGNOSIS — R7989 Other specified abnormal findings of blood chemistry: Secondary | ICD-10-CM

## 2023-04-19 DIAGNOSIS — E871 Hypo-osmolality and hyponatremia: Secondary | ICD-10-CM | POA: Diagnosis not present

## 2023-04-19 DIAGNOSIS — G9389 Other specified disorders of brain: Secondary | ICD-10-CM

## 2023-04-19 DIAGNOSIS — Z5111 Encounter for antineoplastic chemotherapy: Secondary | ICD-10-CM

## 2023-04-19 LAB — CBC WITH DIFFERENTIAL (CANCER CENTER ONLY)
Abs Immature Granulocytes: 0.75 10*3/uL — ABNORMAL HIGH (ref 0.00–0.07)
Basophils Absolute: 0.1 10*3/uL (ref 0.0–0.1)
Basophils Relative: 1 %
Eosinophils Absolute: 0.5 10*3/uL (ref 0.0–0.5)
Eosinophils Relative: 4 %
HCT: 36.4 % — ABNORMAL LOW (ref 39.0–52.0)
Hemoglobin: 11.6 g/dL — ABNORMAL LOW (ref 13.0–17.0)
Immature Granulocytes: 5 %
Lymphocytes Relative: 10 %
Lymphs Abs: 1.4 10*3/uL (ref 0.7–4.0)
MCH: 29.7 pg (ref 26.0–34.0)
MCHC: 31.9 g/dL (ref 30.0–36.0)
MCV: 93.1 fL (ref 80.0–100.0)
Monocytes Absolute: 0.9 10*3/uL (ref 0.1–1.0)
Monocytes Relative: 6 %
Neutro Abs: 10.7 10*3/uL — ABNORMAL HIGH (ref 1.7–7.7)
Neutrophils Relative %: 74 %
Platelet Count: 303 10*3/uL (ref 150–400)
RBC: 3.91 MIL/uL — ABNORMAL LOW (ref 4.22–5.81)
RDW: 15.4 % (ref 11.5–15.5)
WBC Count: 14.3 10*3/uL — ABNORMAL HIGH (ref 4.0–10.5)
nRBC: 0 % (ref 0.0–0.2)

## 2023-04-19 LAB — CMP (CANCER CENTER ONLY)
ALT: 35 U/L (ref 0–44)
AST: 29 U/L (ref 15–41)
Albumin: 2.7 g/dL — ABNORMAL LOW (ref 3.5–5.0)
Alkaline Phosphatase: 85 U/L (ref 38–126)
Anion gap: 9 (ref 5–15)
BUN: 28 mg/dL — ABNORMAL HIGH (ref 8–23)
CO2: 26 mmol/L (ref 22–32)
Calcium: 8.9 mg/dL (ref 8.9–10.3)
Chloride: 96 mmol/L — ABNORMAL LOW (ref 98–111)
Creatinine: 0.8 mg/dL (ref 0.61–1.24)
GFR, Estimated: >60 mL/min (ref >60)
Glucose, Bld: 115 mg/dL — ABNORMAL HIGH (ref 70–99)
Potassium: 4.2 mmol/L (ref 3.5–5.1)
Sodium: 131 mmol/L — ABNORMAL LOW (ref 135–145)
Total Bilirubin: 0.5 mg/dL (ref 0.3–1.2)
Total Protein: 6.8 g/dL (ref 6.5–8.1)

## 2023-04-19 LAB — MAGNESIUM: Magnesium: 2.1 mg/dL (ref 1.7–2.4)

## 2023-04-19 NOTE — Progress Notes (Signed)
C/o rt eg "titches"/jumps.

## 2023-04-19 NOTE — Assessment & Plan Note (Signed)
S/p palliative brain radiation. recommend patient to continue another week of Dexamethasone 1mg  daily  then stop.

## 2023-04-19 NOTE — Assessment & Plan Note (Signed)
Secondary to chronic alcohol use. improved Encourage patient's alcohol cessation effort. Will close monitor LFT every 2 weeks for the first 3 months of stat of Retevmo,  

## 2023-04-19 NOTE — Progress Notes (Signed)
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 Prognosis is poor. Clinically patient has improved. He has RET mutation. Recommend  Retevmo 120mg  BID.  Labs are reviewed and discussed with patient. Proceed with Retevmo   Hyponatremia Likely secondary to chronic alcohol use and possible SIADH. Monitor sodium levels.  Brain mass S/p palliative brain radiation. recommend patient to continue another week of Dexamethasone 1mg  daily  then stop.   Elevated LFTs Secondary to chronic alcohol use. improved Encourage patient's alcohol cessation effort. Will close monitor LFT every 2 weeks for the first 3 months of stat of Retevmo,   Encounter for antineoplastic chemotherapy Chemotherapy plan as listed above.   Pericardial effusion S/p pericardiocentesis. Clinically stable.    interpreter service was utilized for the entire encounter today.  Orders Placed This Encounter  Procedures   CBC with Differential (Cancer Center Only)    Standing Status:   Future    Standing Expiration Date:   04/18/2024   CMP (Cancer Center only)    Standing Status:   Future    Standing Expiration Date:   04/18/2024   Follow up in 2 weeks.  All questions were answered. The patient knows to call the clinic with any problems, questions or concerns.  Rickard Patience, MD, PhD [facility] Hematology Oncology 04/19/2023    HISTORY OF PRESENTING ILLNESS:  Cody [name] 77 y.o. male presents to  establish care for metastatic lung cancer I have reviewed his chart and materials related to his cancer extensively and collaborated history with the patient. Summary of oncologic history is as follows: Oncology History  Metastatic non-small cell lung cancer (HCC)  03/05/2023 Imaging   CT chest abdomen pelvis with contrast showed large pericardial effusion. Moderate right and small left effusion with some loculated components on the left. Adjacent parenchymal opacities. Atelectasis versus infiltrates. Few borderline enlarged lymph nodes including mediastinum, upper retroperitoneum. Enlarged prostate with mass effect along the bladder with bladder wall thickening. No bowel obstruction or free air. Small area of wall thickening along the colon.    03/05/2023 Imaging   CT head showed abdominal vasogenic edema in the right posterior frontal region and in the left occipital lobe worrisome for presence of occult mass lesions. Questionable mild edema and mass effect in the inferior right cerebellum. No visible hemorrhage. 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 Atherosclerotic calcification of the major vessels at the base of brain.    03/05/2023 Imaging   MRI brain with and without contrast showed multiple contrast-enhancing lesions in the right frontal lobe, left occipital lobe, superior right cerebellum with surrounding vasogenic edema. Worrisome for intracranial metastatic disease. Small dural based contrast-enhancing lesions along the left frontal convexity could represent a small hemangioma. Metastatic disease is not excluded.    03/05/2023 - 03/12/2023 Hospital Admission   Patient presented emergency room due to confusion, shortness of breath. CT head and MRI showed multiple contrast-enhancing lesions with vasogenic edema CT chest abdomen pelvis showed large pericardial effusion, pleural effusion, mediastinal/upper retroperitoneum lymphadenopathy Patient was treated with Decadron, neurosurgery  recommended no intervention. Status post pericardiocentesis on 03/07/2023.  Pigtail catheter was initially placed and later on removed.  TTE showed no pericardial effusion. Bilateral pleural effusion, status post thoracentesis.  Cytology positive. 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  Patient was treated with 3% saline clinic sodium improved. Gout with uric acid of 9.7.  Started on colchicine. Alcohol use disorder, status post CIWA protocol with Ativan as needed.   03/07/2023 Cancer Staging   Staging form: Lung, AJCC 8th Edition - Clinical stage from 03/07/2023: Stage IV (cTX, cN2, cM1) - Signed by Rickard Patience, MD on 03/13/2023 Stage prefix: Initial diagnosis   03/07/2023 Echocardiogram   1. Left ventricular ejection fraction, by estimation, is 60 to 65%. The left ventricle has normal function. The left ventricle has no regional  wall motion abnormalities. Indeterminate diastolic filling due to E-A fusion.   2. Right ventricular systolic function is normal. The right ventricular size is normal.   3. Moderate pericardial effusion.   4. The mitral valve is normal in structure. Mild mitral valve regurgitation. No evidence of mitral stenosis.   5. The aortic valve is normal in structure. Aortic valve regurgitation is mild. No aortic stenosis is present.   6. The inferior vena cava is normal in size with greater than 50% respiratory variability, suggesting right atrial pressure of 3 mmHg.     03/07/2023 Procedure   -5/9 S/p pericardiocentesis with removal of 1.1 L of bloody fluid.  Pigtail catheter left in place.  -Cytology came back positive for malignancy.  Metastatic adenocarcinoma, compatible with pulmonary adenocarcinoma   -5/9 s/p  thoracentesis, cytology is negative.    03/08/2023 Initial Diagnosis   Metastatic non-small cell lung cancer (HCC)  [facility] NGS showed RET [id number] missense variant [VAF33.8%] NF1 frame shift, TP53, NFE2L2, CDKN2A, CKKN2B TMB 15.8, MSI  stable.   03/11/2023 Echocardiogram   1. Left ventricular ejection fraction, by estimation, is 60 to 65%. The left ventricle has normal function.   2. Mild to moderate mitral valve regurgitation.   3. Aortic valve regurgitation is mild to moderate    03/19/2023 - 04/03/2023 Radiation Therapy   Whole Brain Radiation.    03/20/2023 - 04/03/2023 Radiation Therapy   Whole Brain Radiation.    03/23/2023 Imaging   PET scan showed 13 mm irregular left lower lobe nodule, likely corresponding to the patient's known primary bronchogenic carcinoma.   Thoracic and upper abdominal nodal metastases, as described above.   Irregular appearance of the posterior gastric antrum, with associated hypermetabolism, as above. Appearance favors gastritis versus gastric ulcer. Consider upper endoscopy for further evaluation.   Prior pericardial effusion and bilateral pleural effusions have resolved.    Today patient was accompanied by her daughter Tiajuana Amass the clinic.    Speaks with [place]  interpreter service was utilized for the entire encounter today.  He takes Dex 1mg  daily Denies SOB, cough.  He has stopped smoking and drinking alcohol    MEDICAL HISTORY:  Past Medical History:  Diagnosis Date   Alcohol dependence (HCC)    CHF (congestive heart failure) (HCC)    Hypertension    Tobacco abuse     SURGICAL HISTORY: Past Surgical History:  Procedure Laterality Date   APPENDECTOMY     LEFT HEART CATH AND CORONARY ANGIOGRAPHY N/A 08/25/2018   Procedure: LEFT HEART CATH AND CORONARY ANGIOGRAPHY;  Surgeon: Laurier Nancy, MD;  Location: [facility] INVASIVE CV LAB;  Service: Cardiovascular;  Laterality: N/A;   PERICARDIOCENTESIS N/A 03/06/2023  Procedure: PERICARDIOCENTESIS;  Surgeon: Marcina Millard, MD;  Location: [facility] INVASIVE CV LAB;  Service: Cardiovascular;  Laterality: N/A;    SOCIAL HISTORY: Social History   Socioeconomic History   Marital status: Married    Spouse name: Not on file    Number of children: Not on file   Years of education: Not on file   Highest education level: Not on file  Occupational History   Not on file  Tobacco Use   Smoking status: Former    Packs/day: 0.50    Years: 50.00    Additional pack years: 0.00    Total pack years: 25.00    Types: Cigarettes    Quit date: 03/08/2023    Years since quitting: 0.1    Passive exposure: Current   Smokeless tobacco: Never  Vaping Use   Vaping Use: Never used  Substance and Sexual Activity   Alcohol use: Not Currently    Comment: stopping drinking approx 03/03/23   Drug use: Never   Sexual activity: Not Currently    Birth control/protection: None  Other Topics Concern   Not on file  Social History Narrative   Not on file   Social Determinants of Health   Financial Resource Strain: Low Risk  (08/25/2018)   Overall Financial Resource Strain (CARDIA)    Difficulty of Paying Living Expenses: Not very hard  Food Insecurity: Not on file  Transportation Needs: Unknown (08/25/2018)   PRAPARE - Transportation    Lack of Transportation (Medical): No    Lack of Transportation (Non-Medical): Not on file  Physical Activity: Not on file  Stress: No Stress Concern Present (08/25/2018)   [employer] of Occupational Health - Occupational Stress Questionnaire    Feeling of Stress : Only a little  Social Connections: Not on file  Intimate Partner Violence: Not on file    FAMILY HISTORY: No family history on file.  ALLERGIES:  is allergic to shellfish allergy.  MEDICATIONS:  Current Outpatient Medications  Medication Sig Dispense Refill   allopurinol (ZYLOPRIM) 100 MG tablet Take 1 tablet (100 mg total) by mouth daily. 30 tablet 11   dexamethasone (DECADRON) 1 MG tablet Take 1 tablet (1 mg total) by mouth daily with breakfast. 14 tablet 0   finasteride (PROSCAR) 5 MG tablet Take 1 tablet (5 mg total) by mouth daily. 90 tablet 3   folic acid (FOLVITE) 1 MG tablet Take 1 tablet (1 mg total) by  mouth daily. 30 tablet 2   iron polysaccharides (NIFEREX) 150 MG capsule Take 1 capsule (150 mg total) by mouth daily. 30 capsule 2   Lactulose 20 GM/30ML SOLN Take 30 mLs (20 g total) by mouth 2 (two) times daily. 450 mL 1   levETIRAcetam (KEPPRA) 500 MG tablet Take 1 tablet (500 mg total) by mouth 2 (two) times daily. 60 tablet 5   loperamide (IMODIUM) 2 MG capsule Take 1 capsule (2 mg total) by mouth See admin instructions. Initial: 4 mg,the 2 mg every 2 hours (4 mg every 4 hours at night)  maximum: 16 mg/day 60 capsule 2   Multiple Vitamin (MULTIVITAMIN WITH MINERALS) TABS tablet Take 1 tablet by mouth daily. 30 tablet 2   polyethylene glycol (MIRALAX / GLYCOLAX) 17 g packet Take 17 g by mouth daily as needed for mild constipation or moderate constipation. 14 each 0   prochlorperazine (COMPAZINE) 10 MG tablet Take 1 tablet (10 mg total) by mouth every 6 (six) hours as needed for nausea or vomiting. 60 tablet 0  selpercatinib (RETEVMO) 40 MG capsule Take 3 capsules (120 mg total) by mouth 2 (two) times daily. 180 capsule 0   tamsulosin (FLOMAX) 0.4 MG CAPS capsule Take 1 capsule (0.4 mg total) by mouth daily. 90 capsule 3   thiamine (VITAMIN B-1) 100 MG tablet Take 1 tablet (100 mg total) by mouth daily. 30 tablet 2   Vitamin D, Ergocalciferol, (DRISDOL) 1.25 MG (50000 UNIT) CAPS capsule Take 1 capsule (50,000 Units total) by mouth every 7 (seven) days. 12 capsule 0   No current facility-administered medications for this visit.    Review of Systems  Constitutional:  Positive for fatigue. Negative for appetite change, chills and fever.  HENT:   Negative for hearing loss and voice change.   Eyes:  Negative for eye problems and icterus.  Respiratory:  Negative for chest tightness, cough and shortness of breath.   Cardiovascular:  Negative for chest pain and leg swelling.  Gastrointestinal:  Negative for abdominal distention and abdominal pain.  Endocrine: Negative for hot flashes.   Genitourinary:  Negative for difficulty urinating, dysuria and frequency.   Musculoskeletal:  Negative for arthralgias.  Skin:  Negative for itching and rash.  Neurological:  Negative for light-headedness and numbness.  Hematological:  Negative for adenopathy. Does not bruise/bleed easily.  Psychiatric/Behavioral:  Positive for sleep disturbance. Negative for confusion.      PHYSICAL EXAMINATION: ECOG PERFORMANCE STATUS: 1 - Symptomatic but completely ambulatory  Vitals:   04/19/23 1034  BP: (!) 166/80  Pulse: 66  Temp: (!) 96.5 F (35.8 C)  SpO2: 100%   Filed Weights   04/19/23 1034  Weight: 107 lb (48.5 kg)    Physical Exam Constitutional:      General: He is not in acute distress.    Appearance: He is not diaphoretic.  HENT:     Head: Normocephalic and atraumatic.     Mouth/Throat:     Pharynx: No oropharyngeal exudate.  Eyes:     General: No scleral icterus. Cardiovascular:     Rate and Rhythm: Normal rate and regular rhythm.  Pulmonary:     Effort: Pulmonary effort is normal. No respiratory distress.  Abdominal:     General: There is no distension.  Musculoskeletal:        General: Normal range of motion.     Cervical back: Normal range of motion and neck supple.  Skin:    Findings: No erythema.  Neurological:     Mental Status: He is alert. Mental status is at baseline.     Cranial Nerves: No cranial nerve deficit.     Motor: No abnormal muscle tone.  Psychiatric:        Mood and Affect: Mood and affect normal.      LABORATORY DATA:  I have reviewed the data as listed    Latest Ref Rng & Units 04/19/2023   10:18 AM 04/02/2023    3:55 PM 03/21/2023    3:13 PM  CBC  WBC 4.0 - 10.5 K/uL 14.3  11.8  17.6   Hemoglobin 13.0 - 17.0 g/dL 09.8  11.9  14.7   Hematocrit 39.0 - 52.0 % 36.4  38.7  35.2   Platelets 150 - 400 K/uL 303  261  241       Latest Ref Rng & Units 04/19/2023   10:18 AM 04/08/2023    1:03 PM 04/02/2023    3:55 PM  CMP  Glucose 70 -  99 mg/dL 829  78  86   BUN 8 -  23 mg/dL 28  21  28    Creatinine 0.61 - 1.24 mg/dL 0.10  9.32  3.55   Sodium 135 - 145 mmol/L 131  133  131   Potassium 3.5 - 5.1 mmol/L 4.2  4.6  4.7   Chloride 98 - 111 mmol/L 96  98  96   CO2 22 - 32 mmol/L 26  23  29    Calcium 8.9 - 10.3 mg/dL 8.9  9.0  8.9   Total Protein 6.5 - 8.1 g/dL 6.8  6.4  7.3   Total Bilirubin 0.3 - 1.2 mg/dL 0.5  <7.3  0.6   Alkaline Phos 38 - 126 U/L 85  98  88   AST 15 - 41 U/L 29  29  35   ALT 0 - 44 U/L 35  40  67      RADIOGRAPHIC STUDIES: I have personally reviewed the radiological images as listed and agreed with the findings in the report. No results found.

## 2023-04-19 NOTE — Assessment & Plan Note (Addendum)
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 Prognosis is poor. Clinically patient has improved. He has RET mutation. Recommend  Retevmo 120mg  BID.  Labs are reviewed and discussed with patient. Proceed with Retevmo

## 2023-04-19 NOTE — Assessment & Plan Note (Signed)
S/p pericardiocentesis. Clinically stable.  

## 2023-04-19 NOTE — Assessment & Plan Note (Signed)
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 Monitor sodium levels. 

## 2023-04-19 NOTE — Assessment & Plan Note (Signed)
Chemotherapy plan as listed above 

## 2023-04-23 ENCOUNTER — Ambulatory Visit: Payer: Medicare Other | Admitting: Nurse Practitioner

## 2023-04-25 ENCOUNTER — Inpatient Hospital Stay: Payer: Medicare Other

## 2023-04-25 ENCOUNTER — Other Ambulatory Visit: Payer: Self-pay

## 2023-04-25 NOTE — Progress Notes (Signed)
Nutrition Follow-up:  Patient with stage IV non-small cell lung cancer with metastasis to brain, malignant pleural and pericardial effusions.  Patient receiving retevmo.  Spoke with daughter, Irving Burton via phone for nutrition follow-up.  Daughter reports that patient's appetite is good.  Denies having trouble with appetite or concerns.    Medications: reviewed  Labs: reviewed  Anthropometrics:   107 lb on 6/21 Weight 95 lb 6/4 UBW of 101 lb    NUTRITION DIAGNOSIS: Inadequate oral intake improved   INTERVENTION:  Encouraged continued high calorie, high protein foods Daughter prefers to call RD if she has questions or concerns about appetite/nutrition Contact number given to daughter    NEXT VISIT: no follow-up Dtr to contact RD if needed  Doyt Castellana B. Freida Busman, RD, LDN Registered Dietitian [phone]

## 2023-04-26 ENCOUNTER — Emergency Department
Admission: EM | Admit: 2023-04-26 | Discharge: 2023-04-26 | Disposition: A | Discharge status: Home / Self Care | Payer: Medicare Other | Attending: Emergency Medicine | Admitting: Emergency Medicine

## 2023-04-26 ENCOUNTER — Telehealth: Payer: Self-pay

## 2023-04-26 ENCOUNTER — Other Ambulatory Visit: Payer: Self-pay

## 2023-04-26 ENCOUNTER — Other Ambulatory Visit: Payer: Self-pay | Admitting: Internal Medicine

## 2023-04-26 ENCOUNTER — Emergency Department: Payer: Medicare Other

## 2023-04-26 DIAGNOSIS — R0602 Shortness of breath: Secondary | ICD-10-CM | POA: Diagnosis not present

## 2023-04-26 DIAGNOSIS — R000 Tachycardia, unspecified: Secondary | ICD-10-CM | POA: Insufficient documentation

## 2023-04-26 DIAGNOSIS — Z859 Personal history of malignant neoplasm, unspecified: Secondary | ICD-10-CM | POA: Diagnosis not present

## 2023-04-26 DIAGNOSIS — R079 Chest pain, unspecified: Secondary | ICD-10-CM

## 2023-04-26 DIAGNOSIS — R0789 Other chest pain: Secondary | ICD-10-CM | POA: Insufficient documentation

## 2023-04-26 DIAGNOSIS — M545 Low back pain, unspecified: Secondary | ICD-10-CM

## 2023-04-26 DIAGNOSIS — B029 Zoster without complications: Secondary | ICD-10-CM | POA: Insufficient documentation

## 2023-04-26 DIAGNOSIS — M549 Dorsalgia, unspecified: Secondary | ICD-10-CM | POA: Diagnosis not present

## 2023-04-26 LAB — BASIC METABOLIC PANEL
Anion gap: 10 (ref 5–15)
BUN: 26 mg/dL — ABNORMAL HIGH (ref 8–23)
CO2: 24 mmol/L (ref 22–32)
Calcium: 8.6 mg/dL — ABNORMAL LOW (ref 8.9–10.3)
Chloride: 96 mmol/L — ABNORMAL LOW (ref 98–111)
Creatinine, Ser: 0.84 mg/dL (ref 0.61–1.24)
GFR, Estimated: >60 mL/min (ref >60)
Glucose, Bld: 154 mg/dL — ABNORMAL HIGH (ref 70–99)
Potassium: 3.8 mmol/L (ref 3.5–5.1)
Sodium: 130 mmol/L — ABNORMAL LOW (ref 135–145)

## 2023-04-26 LAB — CBC
HCT: 38.5 % — ABNORMAL LOW (ref 39.0–52.0)
Hemoglobin: 12.3 g/dL — ABNORMAL LOW (ref 13.0–17.0)
MCH: 29.4 pg (ref 26.0–34.0)
MCHC: 31.9 g/dL (ref 30.0–36.0)
MCV: 91.9 fL (ref 80.0–100.0)
Platelets: 263 10*3/uL (ref 150–400)
RBC: 4.19 MIL/uL — ABNORMAL LOW (ref 4.22–5.81)
RDW: 15.9 % — ABNORMAL HIGH (ref 11.5–15.5)
WBC: 11.1 10*3/uL — ABNORMAL HIGH (ref 4.0–10.5)
nRBC: 0 % (ref 0.0–0.2)

## 2023-04-26 LAB — TROPONIN I (HIGH SENSITIVITY)
Troponin I (High Sensitivity): 18 ng/L — ABNORMAL HIGH (ref <18)
Troponin I (High Sensitivity): 15 ng/L (ref <18)

## 2023-04-26 LAB — ACID FAST CULTURE WITH REFLEXED SENSITIVITIES (MYCOBACTERIA): Acid Fast Culture: NEGATIVE

## 2023-04-26 MED ORDER — IOHEXOL 350 MG/ML SOLN
75.0000 mL | Freq: Once | INTRAVENOUS | Status: AC | PRN
  Administered 2023-04-26: 75 mL via INTRAVENOUS

## 2023-04-26 MED ORDER — ACYCLOVIR 800 MG PO TABS: 800.0000 mg | ORAL_TABLET | Freq: Every day | ORAL | 0 refills | Status: DC

## 2023-04-26 MED ORDER — TRAMADOL HCL 50 MG PO TABS
50.0000 mg | ORAL_TABLET | Freq: Two times a day (BID) | ORAL | 0 refills | Status: DC | PRN
Start: 2023-04-26 — End: 2023-05-14

## 2023-04-26 NOTE — Telephone Encounter (Signed)
Patient daughter called stating that the patient is having low back pain and would like to get something called in for it please advise

## 2023-04-26 NOTE — ED Triage Notes (Addendum)
Pt to ed from home via [facility] for Chest pain and back pain since yesterday. Pt has significant medical HX including cancer. Pt speaks vietnamese.   Vitals:  EKG normal 136/80 100HR  98% RA 20 LAC  Interpreter on a stick used but she is having a hard time interpreting.

## 2023-04-26 NOTE — ED Provider Notes (Signed)
[facility] Provider Note    Event Date/Time   First MD Initiated Contact with Patient 04/26/23 1608     (approximate)   History   Chest Pain (X 2 days - cancer PT)   HPI  Cody [name] is a 77 y.o. male who presents to the emergency department today because of concerns for chest pain.  Pain started 2 to 3 days ago.  Located in the central lower chest.  No radiation to the neck or left arm.  It is accompanied by some shortness of breath.  In addition the patient has been having some back pain.  Has had occasional cough.  No fevers or chills.  Denies similar symptoms in the past.     Physical Exam   Triage Vital Signs: ED Triage Vitals [04/26/23 1457]  Enc Vitals Group     BP 139/77     Pulse Rate 100     Resp 16     Temp 98 F (36.7 C)     Temp Source Oral     SpO2 100 %     Weight 106 lb 14.8 oz (48.5 kg)     Height 4\' 11"  (1.499 m)     Head Circumference      Peak Flow      Pain Score 5     Pain Loc      Pain Edu?      Excl. in GC?     Most recent vital signs: Vitals:   04/26/23 1457 04/26/23 1638  BP: 139/77 (!) 150/70  Pulse: 100 94  Resp: 16 19  Temp: 98 F (36.7 C) (!) 97.5 F (36.4 C)  SpO2: 100% 97%   General: Awake, alert, oriented. CV:  Good peripheral perfusion. Regular rate and rhythm. Resp:  Normal effort. Lungs clear. Abd:  No distention.  Other:  Rash consistent with shingles noted to right flank and abdomen.    ED Results / Procedures / Treatments   Labs (all labs ordered are listed, but only abnormal results are displayed) Labs Reviewed  BASIC METABOLIC PANEL - Abnormal; Notable for the following components:      Result Value   Sodium 130 (*)    Chloride 96 (*)    Glucose, Bld 154 (*)    BUN 26 (*)    Calcium 8.6 (*)    All other components within normal limits  CBC - Abnormal; Notable for the following components:   WBC 11.1 (*)    RBC 4.19 (*)    Hemoglobin 12.3 (*)    HCT 38.5 (*)    RDW 15.9 (*)     All other components within normal limits  TROPONIN I (HIGH SENSITIVITY) - Abnormal; Notable for the following components:   Troponin I (High Sensitivity) 18 (*)    All other components within normal limits  TROPONIN I (HIGH SENSITIVITY)     EKG  I, Phineas Semen, attending physician, personally viewed and interpreted this EKG  EKG Time: 1500 Rate: 102 Rhythm: sinus tachycardia Axis: normal Intervals: qtc 463 QRS: narrow ST changes: no st elevation Impression: abnormal ekg   RADIOLOGY I independently interpreted and visualized the CXR. My interpretation: No pneumonia Radiology interpretation:  IMPRESSION:  Improved left lower lung infiltrate.   I independently interpreted and visualized the CT angio. My interpretation: No large PE Radiology interpretation:  IMPRESSION:  1. No evidence of pulmonary embolus.  2. Stable irregular spiculated left lower lobe pulmonary nodule,  consistent with a metabolically  active nodule on prior PET scan and  compatible with malignancy.  3. Persistent mediastinal and upper abdominal adenopathy, consistent  with nodal metastases on prior PET scan.  4. Moderate pericardial effusion has recurred since the PET scan  03/19/2023.  5. Persistent wedge-shaped consolidation within the medial left  lower lobe, likely scarring or atelectasis given lack of metabolic  activity on prior PET scan.  6. Aortic Atherosclerosis (ICD10-I70.0) and Emphysema (ICD10-J43.9).       PROCEDURES:  Critical Care performed: No    MEDICATIONS ORDERED IN ED: Medications - No data to display   IMPRESSION / MDM / ASSESSMENT AND PLAN / ED COURSE  I reviewed the triage vital signs and the nursing notes.                              Differential diagnosis includes, but is not limited to, PE, ACS, pneumonia  Patient's presentation is most consistent with acute presentation with potential threat to life or bodily function.   The patient is on the cardiac  monitor to evaluate for evidence of arrhythmia and/or significant heart rate changes.  Patient presented to the emergency department today because of concerns for chest pain.  Patient does have history of cancer.  On exam patient has shingles to the right flank.  Initial troponin was slightly elevated however repeat was normal.  Did obtain a CT angio given history of cancer concern for possible PE.  This did not show any PE or other acute findings.  Does show metastatic disease.  This time somewhat unclear cause of the patient's chest pain although I Nicolo think the back pain is likely related to the shingles.  Will plan on discharging with antiviral medication.      FINAL CLINICAL IMPRESSION(S) / ED DIAGNOSES   Final diagnoses:  Nonspecific chest pain  Herpes zoster without complication     Note:  This document was prepared using Dragon voice recognition software and may include unintentional dictation errors.    Phineas Semen, MD 04/26/23 [phone]

## 2023-05-01 NOTE — Telephone Encounter (Signed)
Spoke with patients daughter Irving Burton verified with chart), patients DOB verified and was informed by daughter patient was seen in ED and medications have been picked up.

## 2023-05-03 ENCOUNTER — Other Ambulatory Visit (HOSPITAL_COMMUNITY): Payer: Self-pay

## 2023-05-03 ENCOUNTER — Inpatient Hospital Stay (HOSPITAL_BASED_OUTPATIENT_CLINIC_OR_DEPARTMENT_OTHER): Payer: Medicare Other | Admitting: Oncology

## 2023-05-03 ENCOUNTER — Inpatient Hospital Stay: Payer: Medicare Other | Attending: Oncology

## 2023-05-03 ENCOUNTER — Other Ambulatory Visit: Payer: Self-pay

## 2023-05-03 ENCOUNTER — Encounter: Payer: Self-pay | Admitting: Oncology

## 2023-05-03 VITALS — BP 127/73 | HR 106 | Temp 97.3°F | Resp 18 | Wt 99.0 lb

## 2023-05-03 DIAGNOSIS — Z87891 Personal history of nicotine dependence: Secondary | ICD-10-CM | POA: Insufficient documentation

## 2023-05-03 DIAGNOSIS — Z5111 Encounter for antineoplastic chemotherapy: Secondary | ICD-10-CM

## 2023-05-03 DIAGNOSIS — C7931 Secondary malignant neoplasm of brain: Secondary | ICD-10-CM | POA: Insufficient documentation

## 2023-05-03 DIAGNOSIS — C3490 Malignant neoplasm of unspecified part of unspecified bronchus or lung: Secondary | ICD-10-CM | POA: Diagnosis present

## 2023-05-03 DIAGNOSIS — R7989 Other specified abnormal findings of blood chemistry: Secondary | ICD-10-CM

## 2023-05-03 DIAGNOSIS — R634 Abnormal weight loss: Secondary | ICD-10-CM | POA: Diagnosis not present

## 2023-05-03 DIAGNOSIS — I3139 Other pericardial effusion (noninflammatory): Secondary | ICD-10-CM

## 2023-05-03 DIAGNOSIS — I3131 Malignant pericardial effusion in diseases classified elsewhere: Secondary | ICD-10-CM | POA: Insufficient documentation

## 2023-05-03 DIAGNOSIS — B029 Zoster without complications: Secondary | ICD-10-CM

## 2023-05-03 DIAGNOSIS — E871 Hypo-osmolality and hyponatremia: Secondary | ICD-10-CM | POA: Diagnosis not present

## 2023-05-03 DIAGNOSIS — G9389 Other specified disorders of brain: Secondary | ICD-10-CM

## 2023-05-03 LAB — CMP (CANCER CENTER ONLY)
ALT: 23 U/L (ref 0–44)
AST: 25 U/L (ref 15–41)
Albumin: 2.3 g/dL — ABNORMAL LOW (ref 3.5–5.0)
Alkaline Phosphatase: 68 U/L (ref 38–126)
Anion gap: 8 (ref 5–15)
BUN: 18 mg/dL (ref 8–23)
CO2: 24 mmol/L (ref 22–32)
Calcium: 8.1 mg/dL — ABNORMAL LOW (ref 8.9–10.3)
Chloride: 98 mmol/L (ref 98–111)
Creatinine: 0.77 mg/dL (ref 0.61–1.24)
GFR, Estimated: >60 mL/min (ref >60)
Glucose, Bld: 105 mg/dL — ABNORMAL HIGH (ref 70–99)
Potassium: 4 mmol/L (ref 3.5–5.1)
Sodium: 130 mmol/L — ABNORMAL LOW (ref 135–145)
Total Bilirubin: 0.2 mg/dL — ABNORMAL LOW (ref 0.3–1.2)
Total Protein: 6.3 g/dL — ABNORMAL LOW (ref 6.5–8.1)

## 2023-05-03 LAB — CBC WITH DIFFERENTIAL (CANCER CENTER ONLY)
Abs Immature Granulocytes: 0.06 10*3/uL (ref 0.00–0.07)
Basophils Absolute: 0 10*3/uL (ref 0.0–0.1)
Basophils Relative: 0 %
Eosinophils Absolute: 0.7 10*3/uL — ABNORMAL HIGH (ref 0.0–0.5)
Eosinophils Relative: 8 %
HCT: 35 % — ABNORMAL LOW (ref 39.0–52.0)
Hemoglobin: 11.5 g/dL — ABNORMAL LOW (ref 13.0–17.0)
Immature Granulocytes: 1 %
Lymphocytes Relative: 11 %
Lymphs Abs: 0.9 10*3/uL (ref 0.7–4.0)
MCH: 29.4 pg (ref 26.0–34.0)
MCHC: 32.9 g/dL (ref 30.0–36.0)
MCV: 89.5 fL (ref 80.0–100.0)
Monocytes Absolute: 0.4 10*3/uL (ref 0.1–1.0)
Monocytes Relative: 5 %
Neutro Abs: 6.3 10*3/uL (ref 1.7–7.7)
Neutrophils Relative %: 75 %
Platelet Count: 241 10*3/uL (ref 150–400)
RBC: 3.91 MIL/uL — ABNORMAL LOW (ref 4.22–5.81)
RDW: 15.9 % — ABNORMAL HIGH (ref 11.5–15.5)
WBC Count: 8.3 10*3/uL (ref 4.0–10.5)
nRBC: 0 % (ref 0.0–0.2)

## 2023-05-03 MED ORDER — ACYCLOVIR 800 MG PO TABS: 800.0000 mg | ORAL_TABLET | Freq: Every day | ORAL | 0 refills | Status: AC

## 2023-05-03 MED ORDER — SELPERCATINIB 40 MG PO CAPS
120.0000 mg | ORAL_CAPSULE | Freq: Two times a day (BID) | ORAL | 0 refills | Status: DC
Start: 2023-05-03 — End: 2023-05-14
  Filled 2023-05-03 (×2): qty 180, 30d supply, fill #0

## 2023-05-03 NOTE — Progress Notes (Signed)
Pt here for follow up. Per son-in-law, pt has shingles on stomach, which is causing mild pain and itching.

## 2023-05-03 NOTE — Assessment & Plan Note (Signed)
Secondary to chronic alcohol use. improved Encourage patient's alcohol cessation effort. Will close monitor LFT every 2 weeks for the first 3 months of stat of Retevmo,  

## 2023-05-03 NOTE — Assessment & Plan Note (Signed)
Most of the vesicles are crusted.  I recommend to extend acyclovir course for another week. Rx sent.

## 2023-05-03 NOTE — Assessment & Plan Note (Signed)
Chemotherapy plan as listed above 

## 2023-05-03 NOTE — Progress Notes (Signed)
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 Prognosis is poor. Clinically patient has improved. He has RET mutation. Recommend  Retevmo 120mg  BID.  Labs are reviewed and discussed with patient. Continue Retevmo   Hyponatremia Likely secondary to chronic alcohol use and possible SIADH. Monitor sodium levels.  Pericardial effusion S/p pericardiocentesis. Clinically stable.   Brain mass S/p palliative brain radiation. Tapered off Dexamethasone 1mg  daily    Elevated LFTs Secondary to chronic alcohol use. improved Encourage patient's alcohol cessation effort. Will close monitor LFT every 2 weeks for the first 3 months of stat of Retevmo,   Encounter for antineoplastic chemotherapy Chemotherapy plan as listed above.   Shingles Most of the vesicles are crusted.  I recommend to extend acyclovir course for another week. Rx sent.    interpreter service was utilized for the entire encounter today.  Orders Placed This Encounter  Procedures   CBC with Differential (Cancer Center Only)    Standing Status:   Future    Standing Expiration Date:   05/02/2024   CMP (Cancer Center only)    Standing Status:   Future    Standing Expiration Date:   05/02/2024   Ambulatory Referral to [facility]    Referral Priority:   Routine    Referral Type:   Consultation    Referral Reason:   Specialty Services Required    Number of Visits Requested:   1   Follow up in  2 weeks.  All questions were answered. The patient knows to call the clinic with any problems, questions or concerns.  Rickard Patience, MD, PhD [facility] Hematology Oncology 05/03/2023    HISTORY OF PRESENTING ILLNESS:  Cody [name] 77 y.o. male presents to establish care for metastatic lung cancer I have reviewed his chart and materials related to his cancer extensively and collaborated history with the patient. Summary of oncologic history is as follows: Oncology History  Metastatic non-small cell lung cancer (HCC)  03/05/2023 Imaging   CT chest abdomen pelvis with contrast showed large pericardial effusion. Moderate right and small left effusion with some loculated components on the left. Adjacent parenchymal opacities. Atelectasis versus infiltrates. Few borderline enlarged lymph nodes including mediastinum, upper retroperitoneum. Enlarged prostate with mass effect along the bladder with bladder wall thickening. No bowel obstruction or free air. Small area of wall thickening along the colon.    03/05/2023 Imaging   CT head showed abdominal vasogenic edema in the right posterior frontal region and in the left occipital lobe worrisome for presence of occult mass lesions. Questionable mild edema and mass effect in the inferior right cerebellum. No visible hemorrhage. 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 Atherosclerotic calcification of the major vessels at the base of brain.    03/05/2023 Imaging   MRI brain with and without contrast showed multiple contrast-enhancing lesions in the right frontal lobe, left occipital lobe, superior right cerebellum with surrounding vasogenic edema. Worrisome for intracranial metastatic disease. Small dural based contrast-enhancing lesions along the left frontal convexity could represent a small  hemangioma. Metastatic disease is not excluded.    03/05/2023 - 03/12/2023 Hospital Admission   Patient presented emergency room due to confusion, shortness of breath. CT head  and MRI showed multiple contrast-enhancing lesions with vasogenic edema CT chest abdomen pelvis showed large pericardial effusion, pleural effusion, mediastinal/upper retroperitoneum lymphadenopathy Patient was treated with Decadron, neurosurgery recommended no intervention. Status post pericardiocentesis on 03/07/2023.  Pigtail catheter was initially placed and later on removed.  TTE showed no pericardial effusion. Bilateral pleural effusion, status post thoracentesis.  Cytology positive. 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  Patient was treated with 3% saline clinic sodium improved. Gout with uric acid of 9.7.  Started on colchicine. Alcohol use disorder, status post CIWA protocol with Ativan as needed.   03/07/2023 Cancer Staging   Staging form: Lung, AJCC 8th Edition - Clinical stage from 03/07/2023: Stage IV (cTX, cN2, cM1) - Signed by Rickard Patience, MD on 03/13/2023 Stage prefix: Initial diagnosis   03/07/2023 Echocardiogram   1. Left ventricular ejection fraction, by estimation, is 60 to 65%. The left ventricle has normal function. The left ventricle has no regional  wall motion abnormalities. Indeterminate diastolic filling due to E-A fusion.   2. Right ventricular systolic function is normal. The right ventricular size is normal.   3. Moderate pericardial effusion.   4. The mitral valve is normal in structure. Mild mitral valve regurgitation. No evidence of mitral stenosis.   5. The aortic valve is normal in structure. Aortic valve regurgitation is mild. No aortic stenosis is present.   6. The inferior vena cava is normal in size with greater than 50% respiratory variability, suggesting right atrial pressure of 3 mmHg.     03/07/2023 Procedure   -5/9 S/p pericardiocentesis with removal of 1.1 L of bloody fluid.  Pigtail catheter left in place.  -Cytology came back positive for malignancy.  Metastatic adenocarcinoma, compatible with pulmonary adenocarcinoma    -5/9 s/p  thoracentesis, cytology is negative.    03/08/2023 Initial Diagnosis   Metastatic non-small cell lung cancer (HCC)  [facility] NGS showed RET [id number] missense variant [VAF33.8%] NF1 frame shift, TP53, NFE2L2, CDKN2A, CKKN2B TMB 15.8, MSI stable.   03/11/2023 Echocardiogram   1. Left ventricular ejection fraction, by estimation, is 60 to 65%. The left ventricle has normal function.   2. Mild to moderate mitral valve regurgitation.   3. Aortic valve regurgitation is mild to moderate    03/19/2023 - 04/03/2023 Radiation Therapy   Whole Brain Radiation.    03/20/2023 - 04/03/2023 Radiation Therapy   Whole Brain Radiation.    03/23/2023 Imaging   PET scan showed 13 mm irregular left lower lobe nodule, likely corresponding to the patient's known primary bronchogenic carcinoma.   Thoracic and upper abdominal nodal metastases, as described above.   Irregular appearance of the posterior gastric antrum, with associated hypermetabolism, as above. Appearance favors gastritis versus gastric ulcer. Consider upper endoscopy for further evaluation.   Prior pericardial effusion and bilateral pleural effusions have resolved.    Today patient was accompanied by her daughter Tiajuana Amass the clinic.    Speaks with [place]  interpreter service was utilized for the entire encounter today.  He finished course of Dex tapering Denies SOB, cough.  He has stopped smoking and drinking alcohol + right flank shingles. He was prescribed 7 day course of acyclovir.     MEDICAL HISTORY:  Past Medical History:  Diagnosis Date   Alcohol dependence (HCC)    CHF (congestive heart failure) (HCC)  Hypertension    Tobacco abuse     SURGICAL HISTORY: Past Surgical History:  Procedure Laterality Date   APPENDECTOMY     LEFT HEART CATH AND CORONARY ANGIOGRAPHY N/A 08/25/2018   Procedure: LEFT HEART CATH AND CORONARY ANGIOGRAPHY;  Surgeon: Laurier Nancy, MD;  Location: [facility] INVASIVE CV LAB;  Service:  Cardiovascular;  Laterality: N/A;   PERICARDIOCENTESIS N/A 03/06/2023   Procedure: PERICARDIOCENTESIS;  Surgeon: Marcina Millard, MD;  Location: [facility] INVASIVE CV LAB;  Service: Cardiovascular;  Laterality: N/A;    SOCIAL HISTORY: Social History   Socioeconomic History   Marital status: Married    Spouse name: Not on file   Number of children: Not on file   Years of education: Not on file   Highest education level: Not on file  Occupational History   Not on file  Tobacco Use   Smoking status: Former    Packs/day: 0.50    Years: 50.00    Additional pack years: 0.00    Total pack years: 25.00    Types: Cigarettes    Quit date: 03/08/2023    Years since quitting: 0.1    Passive exposure: Current   Smokeless tobacco: Never  Vaping Use   Vaping Use: Never used  Substance and Sexual Activity   Alcohol use: Not Currently    Comment: stopping drinking approx 03/03/23   Drug use: Never   Sexual activity: Not Currently    Birth control/protection: None  Other Topics Concern   Not on file  Social History Narrative   Not on file   Social Determinants of Health   Financial Resource Strain: Low Risk  (08/25/2018)   Overall Financial Resource Strain (CARDIA)    Difficulty of Paying Living Expenses: Not very hard  Food Insecurity: Not on file  Transportation Needs: Unknown (08/25/2018)   PRAPARE - Transportation    Lack of Transportation (Medical): No    Lack of Transportation (Non-Medical): Not on file  Physical Activity: Not on file  Stress: No Stress Concern Present (08/25/2018)   [employer] of Occupational Health - Occupational Stress Questionnaire    Feeling of Stress : Only a little  Social Connections: Not on file  Intimate Partner Violence: Not on file    FAMILY HISTORY: History reviewed. No pertinent family history.  ALLERGIES:  is allergic to shellfish allergy.  MEDICATIONS:  Current Outpatient Medications  Medication Sig Dispense Refill   allopurinol  (ZYLOPRIM) 100 MG tablet Take 1 tablet (100 mg total) by mouth daily. 30 tablet 11   dexamethasone (DECADRON) 1 MG tablet Take 1 tablet (1 mg total) by mouth daily with breakfast. 14 tablet 0   finasteride (PROSCAR) 5 MG tablet Take 1 tablet (5 mg total) by mouth daily. 90 tablet 3   folic acid (FOLVITE) 1 MG tablet Take 1 tablet (1 mg total) by mouth daily. 30 tablet 2   iron polysaccharides (NIFEREX) 150 MG capsule Take 1 capsule (150 mg total) by mouth daily. 30 capsule 2   Lactulose 20 GM/30ML SOLN Take 30 mLs (20 g total) by mouth 2 (two) times daily. 450 mL 1   levETIRAcetam (KEPPRA) 500 MG tablet Take 1 tablet (500 mg total) by mouth 2 (two) times daily. 60 tablet 5   loperamide (IMODIUM) 2 MG capsule Take 1 capsule (2 mg total) by mouth See admin instructions. Initial: 4 mg,the 2 mg every 2 hours (4 mg every 4 hours at night)  maximum: 16 mg/day 60 capsule 2   Multiple Vitamin (MULTIVITAMIN  WITH MINERALS) TABS tablet Take 1 tablet by mouth daily. 30 tablet 2   polyethylene glycol (MIRALAX / GLYCOLAX) 17 g packet Take 17 g by mouth daily as needed for mild constipation or moderate constipation. 14 each 0   prochlorperazine (COMPAZINE) 10 MG tablet Take 1 tablet (10 mg total) by mouth every 6 (six) hours as needed for nausea or vomiting. 60 tablet 0   tamsulosin (FLOMAX) 0.4 MG CAPS capsule Take 1 capsule (0.4 mg total) by mouth daily. 90 capsule 3   thiamine (VITAMIN B-1) 100 MG tablet Take 1 tablet (100 mg total) by mouth daily. 30 tablet 2   traMADol (ULTRAM) 50 MG tablet Take 1 tablet (50 mg total) by mouth every 12 (twelve) hours as needed. 20 tablet 0   Vitamin D, Ergocalciferol, (DRISDOL) 1.25 MG (50000 UNIT) CAPS capsule Take 1 capsule (50,000 Units total) by mouth every 7 (seven) days. 12 capsule 0   acyclovir (ZOVIRAX) 800 MG tablet Take 1 tablet (800 mg total) by mouth 5 (five) times daily for 7 days. 35 tablet 0   selpercatinib (RETEVMO) 40 MG capsule Take 3 capsules (120 mg total)  by mouth 2 (two) times daily. 180 capsule 0   No current facility-administered medications for this visit.    Review of Systems  Constitutional:  Positive for fatigue. Negative for appetite change, chills and fever.  HENT:   Negative for hearing loss and voice change.   Eyes:  Negative for eye problems and icterus.  Respiratory:  Negative for chest tightness, cough and shortness of breath.   Cardiovascular:  Negative for chest pain and leg swelling.  Gastrointestinal:  Negative for abdominal distention and abdominal pain.  Endocrine: Negative for hot flashes.  Genitourinary:  Negative for difficulty urinating, dysuria and frequency.   Musculoskeletal:  Negative for arthralgias.  Skin:  Negative for itching and rash.  Neurological:  Negative for light-headedness and numbness.  Hematological:  Negative for adenopathy. Does not bruise/bleed easily.  Psychiatric/Behavioral:  Positive for sleep disturbance. Negative for confusion.      PHYSICAL EXAMINATION: ECOG PERFORMANCE STATUS: 1 - Symptomatic but completely ambulatory  Vitals:   05/03/23 1057  BP: 127/73  Pulse: (!) 106  Resp: 18  Temp: (!) 97.3 F (36.3 C)   Filed Weights   05/03/23 1057  Weight: 99 lb (44.9 kg)    Physical Exam Constitutional:      General: He is not in acute distress.    Appearance: He is not diaphoretic.  HENT:     Head: Normocephalic and atraumatic.     Mouth/Throat:     Pharynx: No oropharyngeal exudate.  Eyes:     General: No scleral icterus. Cardiovascular:     Rate and Rhythm: Normal rate and regular rhythm.  Pulmonary:     Effort: Pulmonary effort is normal. No respiratory distress.  Abdominal:     General: There is no distension.  Musculoskeletal:        General: Normal range of motion.     Cervical back: Normal range of motion and neck supple.  Skin:    Findings: Rash present. No erythema.  Neurological:     Mental Status: He is alert. Mental status is at baseline.     Cranial  Nerves: No cranial nerve deficit.     Motor: No abnormal muscle tone.  Psychiatric:        Mood and Affect: Mood and affect normal.         LABORATORY DATA:  I have  reviewed the data as listed    Latest Ref Rng & Units 05/03/2023   10:38 AM 04/26/2023    3:01 PM 04/19/2023   10:18 AM  CBC  WBC 4.0 - 10.5 K/uL 8.3  11.1  14.3   Hemoglobin 13.0 - 17.0 g/dL 96.0  45.4  09.8   Hematocrit 39.0 - 52.0 % 35.0  38.5  36.4   Platelets 150 - 400 K/uL 241  263  303       Latest Ref Rng & Units 05/03/2023   10:38 AM 04/26/2023    3:01 PM 04/19/2023   10:18 AM  CMP  Glucose 70 - 99 mg/dL 119  147  829   BUN 8 - 23 mg/dL 18  26  28    Creatinine 0.61 - 1.24 mg/dL 5.62  1.30  8.65   Sodium 135 - 145 mmol/L 130  130  131   Potassium 3.5 - 5.1 mmol/L 4.0  3.8  4.2   Chloride 98 - 111 mmol/L 98  96  96   CO2 22 - 32 mmol/L 24  24  26    Calcium 8.9 - 10.3 mg/dL 8.1  8.6  8.9   Total Protein 6.5 - 8.1 g/dL 6.3   6.8   Total Bilirubin 0.3 - 1.2 mg/dL 0.2   0.5   Alkaline Phos 38 - 126 U/L 68   85   AST 15 - 41 U/L 25   29   ALT 0 - 44 U/L 23   35      RADIOGRAPHIC STUDIES: I have personally reviewed the radiological images as listed and agreed with the findings in the report. CT Angio Chest PE W and/or Wo Contrast  Result Date: 04/26/2023 CLINICAL DATA:  Chest and back pain since yesterday, short of breath, metastatic non-small cell lung cancer EXAM: CT ANGIOGRAPHY CHEST WITH CONTRAST TECHNIQUE: Multidetector CT imaging of the chest was performed using the standard protocol during bolus administration of intravenous contrast. Multiplanar CT image reconstructions and MIPs were obtained to evaluate the vascular anatomy. RADIATION DOSE REDUCTION: This exam was performed according to the departmental dose-optimization program which includes automated exposure control, adjustment of the mA and/or kV according to patient size and/or use of iterative reconstruction technique. CONTRAST:  75mL OMNIPAQUE  IOHEXOL 350 MG/ML SOLN COMPARISON:  03/19/2023, 04/26/2023, 03/05/2023 FINDINGS: Cardiovascular: This is a technically adequate evaluation of the pulmonary vasculature. No filling defects or pulmonary emboli. There is a persistent but decreased pericardial effusion, measuring up to 1.5 cm in thickness where previously it had measured up to 4.2 cm in thickness on CT 03/05/2023. The pericardial effusion had previously resolved on PET scan 03/19/2023. Normal caliber of the thoracic aorta. Stable atherosclerosis of the aorta and coronary vasculature. Mediastinum/Nodes: Mediastinal adenopathy again noted, measuring 8 mm in the pretracheal region and 13 mm in the subcarinal region. Thyroid, trachea, and esophagus are stable. Lungs/Pleura: Stable emphysema. The pleural effusions seen on prior CT have resolved in the interim. 13 mm spiculated left lower lobe pulmonary nodule image 97/6, corresponds to the metabolically active nodule on prior exam. Subpleural wedge-shaped consolidation medial aspect left lower lobe measuring 3 x 2 cm unchanged, which was not metabolically active on prior PET scan, and may reflect atelectasis or scarring. No acute airspace disease. No pneumothorax. Central airways are patent, with retained secretions in the trachea and left mainstem bronchus. Upper Abdomen: Upper abdominal adenopathy again visualized, compatible with the known metastatic adenopathy seen on prior PET scan. No other acute findings.  Musculoskeletal: No acute or destructive bony abnormalities. Reconstructed images demonstrate no additional findings. Review of the MIP images confirms the above findings. IMPRESSION: 1. No evidence of pulmonary embolus. 2. Stable irregular spiculated left lower lobe pulmonary nodule, consistent with a metabolically active nodule on prior PET scan and compatible with malignancy. 3. Persistent mediastinal and upper abdominal adenopathy, consistent with nodal metastases on prior PET scan. 4. Moderate  pericardial effusion has recurred since the PET scan 03/19/2023. 5. Persistent wedge-shaped consolidation within the medial left lower lobe, likely scarring or atelectasis given lack of metabolic activity on prior PET scan. 6. Aortic Atherosclerosis (ICD10-I70.0) and Emphysema (ICD10-J43.9). Electronically Signed   By: Sharlet Salina M.D.   On: 04/26/2023 18:50   DG Chest 2 View  Result Date: 04/26/2023 CLINICAL DATA:  CP EXAM: CHEST - 2 VIEW COMPARISON:  03/07/2023 FINDINGS: Improved left lower lung infiltrate with only a small left infrahilar residual opacity. Right lung clear. No pneumothorax. Heart size and mediastinal contours are within normal limits. Aortic Atherosclerosis (ICD10-170.0). No effusion. Visualized bones unremarkable. IMPRESSION: Improved left lower lung infiltrate. Electronically Signed   By: Corlis Leak M.D.   On: 04/26/2023 15:32

## 2023-05-03 NOTE — Assessment & Plan Note (Signed)
S/p palliative brain radiation. Tapered off Dexamethasone 1mg  daily

## 2023-05-03 NOTE — Assessment & Plan Note (Addendum)
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 Prognosis is poor. Clinically patient has improved. He has RET mutation. Recommend  Retevmo 120mg  BID.  Labs are reviewed and discussed with patient. Continue Retevmo

## 2023-05-03 NOTE — Assessment & Plan Note (Signed)
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 Monitor sodium levels. 

## 2023-05-03 NOTE — Assessment & Plan Note (Signed)
S/p pericardiocentesis. Clinically stable.  

## 2023-05-06 ENCOUNTER — Other Ambulatory Visit (HOSPITAL_COMMUNITY): Payer: Self-pay

## 2023-05-06 ENCOUNTER — Other Ambulatory Visit: Payer: Self-pay

## 2023-05-08 ENCOUNTER — Ambulatory Visit: Payer: Medicare Other | Attending: Radiation Oncology | Admitting: Radiation Oncology

## 2023-05-08 DIAGNOSIS — Z510 Encounter for antineoplastic radiation therapy: Secondary | ICD-10-CM | POA: Insufficient documentation

## 2023-05-08 DIAGNOSIS — Z87891 Personal history of nicotine dependence: Secondary | ICD-10-CM | POA: Insufficient documentation

## 2023-05-08 DIAGNOSIS — I3131 Malignant pericardial effusion in diseases classified elsewhere: Secondary | ICD-10-CM | POA: Insufficient documentation

## 2023-05-08 DIAGNOSIS — E871 Hypo-osmolality and hyponatremia: Secondary | ICD-10-CM | POA: Insufficient documentation

## 2023-05-08 DIAGNOSIS — C7931 Secondary malignant neoplasm of brain: Secondary | ICD-10-CM | POA: Insufficient documentation

## 2023-05-08 DIAGNOSIS — C3490 Malignant neoplasm of unspecified part of unspecified bronchus or lung: Secondary | ICD-10-CM | POA: Insufficient documentation

## 2023-05-10 ENCOUNTER — Other Ambulatory Visit: Payer: Self-pay

## 2023-05-10 ENCOUNTER — Encounter: Payer: Self-pay | Admitting: Medical Oncology

## 2023-05-10 ENCOUNTER — Telehealth: Payer: Self-pay | Admitting: Nurse Practitioner

## 2023-05-10 ENCOUNTER — Inpatient Hospital Stay (HOSPITAL_BASED_OUTPATIENT_CLINIC_OR_DEPARTMENT_OTHER): Payer: Medicare Other | Admitting: Medical Oncology

## 2023-05-10 ENCOUNTER — Other Ambulatory Visit: Payer: Self-pay | Admitting: Internal Medicine

## 2023-05-10 ENCOUNTER — Inpatient Hospital Stay: Payer: Medicare Other

## 2023-05-10 ENCOUNTER — Telehealth: Payer: Self-pay | Admitting: *Deleted

## 2023-05-10 DIAGNOSIS — C3490 Malignant neoplasm of unspecified part of unspecified bronchus or lung: Secondary | ICD-10-CM | POA: Diagnosis not present

## 2023-05-10 DIAGNOSIS — B370 Candidal stomatitis: Secondary | ICD-10-CM

## 2023-05-10 DIAGNOSIS — R634 Abnormal weight loss: Secondary | ICD-10-CM

## 2023-05-10 DIAGNOSIS — F411 Generalized anxiety disorder: Secondary | ICD-10-CM

## 2023-05-10 DIAGNOSIS — B0229 Other postherpetic nervous system involvement: Secondary | ICD-10-CM

## 2023-05-10 LAB — CMP (CANCER CENTER ONLY)
ALT: 25 U/L (ref 0–44)
AST: 29 U/L (ref 15–41)
Albumin: 2.2 g/dL — ABNORMAL LOW (ref 3.5–5.0)
Alkaline Phosphatase: 79 U/L (ref 38–126)
Anion gap: 10 (ref 5–15)
BUN: 17 mg/dL (ref 8–23)
CO2: 22 mmol/L (ref 22–32)
Calcium: 8.6 mg/dL — ABNORMAL LOW (ref 8.9–10.3)
Chloride: 98 mmol/L (ref 98–111)
Creatinine: 0.99 mg/dL (ref 0.61–1.24)
GFR, Estimated: >60 mL/min (ref >60)
Glucose, Bld: 114 mg/dL — ABNORMAL HIGH (ref 70–99)
Potassium: 4.3 mmol/L (ref 3.5–5.1)
Sodium: 130 mmol/L — ABNORMAL LOW (ref 135–145)
Total Bilirubin: 0.3 mg/dL (ref 0.3–1.2)
Total Protein: 6.9 g/dL (ref 6.5–8.1)

## 2023-05-10 LAB — CBC WITH DIFFERENTIAL (CANCER CENTER ONLY)
Abs Immature Granulocytes: 0.04 10*3/uL (ref 0.00–0.07)
Basophils Absolute: 0 10*3/uL (ref 0.0–0.1)
Basophils Relative: 0 %
Eosinophils Absolute: 0.7 10*3/uL — ABNORMAL HIGH (ref 0.0–0.5)
Eosinophils Relative: 12 %
HCT: 38.7 % — ABNORMAL LOW (ref 39.0–52.0)
Hemoglobin: 12.6 g/dL — ABNORMAL LOW (ref 13.0–17.0)
Immature Granulocytes: 1 %
Lymphocytes Relative: 9 %
Lymphs Abs: 0.6 10*3/uL — ABNORMAL LOW (ref 0.7–4.0)
MCH: 28.8 pg (ref 26.0–34.0)
MCHC: 32.6 g/dL (ref 30.0–36.0)
MCV: 88.6 fL (ref 80.0–100.0)
Monocytes Absolute: 0.2 10*3/uL (ref 0.1–1.0)
Monocytes Relative: 4 %
Neutro Abs: 4.7 10*3/uL (ref 1.7–7.7)
Neutrophils Relative %: 74 %
Platelet Count: 295 10*3/uL (ref 150–400)
RBC: 4.37 MIL/uL (ref 4.22–5.81)
RDW: 16.2 % — ABNORMAL HIGH (ref 11.5–15.5)
WBC Count: 6.3 10*3/uL (ref 4.0–10.5)
nRBC: 0 % (ref 0.0–0.2)

## 2023-05-10 MED ORDER — NYSTATIN 100000 UNIT/ML MT SUSP
5.0000 mL | Freq: Four times a day (QID) | OROMUCOSAL | 1 refills | Status: DC | PRN
  Filled 2023-05-10: qty 240, 12d supply, fill #0

## 2023-05-10 MED ORDER — GABAPENTIN 100 MG PO CAPS
100.0000 mg | ORAL_CAPSULE | Freq: Every day | ORAL | 2 refills | Status: DC
Start: 2023-05-10 — End: 2023-05-14

## 2023-05-10 MED ORDER — MIRTAZAPINE 15 MG PO TABS
15.0000 mg | ORAL_TABLET | Freq: Every day | ORAL | 3 refills | Status: DC
Start: 2023-05-10 — End: 2023-05-14

## 2023-05-10 MED ORDER — NYSTATIN 100000 UNIT/ML MT SUSP
5.0000 mL | Freq: Four times a day (QID) | OROMUCOSAL | 0 refills | Status: DC
Start: 2023-05-10 — End: 2023-05-14

## 2023-05-10 NOTE — Progress Notes (Signed)
Magic mouthwash sent to pharmacy 

## 2023-05-10 NOTE — Telephone Encounter (Signed)
Patient's daughter called and left VM, I called her back and she states he is not doing well and he is refusing to eat and drinking very little. He has white spots on his tongue that are painful, may be thrush. He is refusing his medications stating that it hurts to swallow.   Can we prescribe him some meds to increase appetite and for the tongue? He also has shingles that are not resolved, can we send a cream for those?  [facility] [address]

## 2023-05-10 NOTE — Telephone Encounter (Signed)
Patient scheduled to see [facility] this afternoon, daughter was notified of appointment and agreed to bring him in

## 2023-05-10 NOTE — Progress Notes (Signed)
Pt arrived to clinic for [facility] visit regarding mouth blisters and pain. Upon arrival family tells staff pt has shingles. This staff member examined area and open blisters noted on right flank area. Drainage noted on pt's clothes. Clent Jacks PA in to examine patient.

## 2023-05-10 NOTE — Telephone Encounter (Signed)
Patient daughter called reporting that patient is not doing well, he is refusing to eat or drink for the past 10 days,and is having difficulty swallowing. She noticed that he has white spots on his tongue, He is losing weight, he is refusing to get OUT OF BED,She states that they are having to leave him alone while they work and is asking about Palliative care referral.Please advise. She said that if he needs to come in that they will bring him even if he refuses as he has been refusing everything.

## 2023-05-11 ENCOUNTER — Encounter: Payer: Self-pay | Admitting: Internal Medicine

## 2023-05-11 ENCOUNTER — Other Ambulatory Visit: Payer: Self-pay

## 2023-05-11 ENCOUNTER — Inpatient Hospital Stay
Admission: EM | Admit: 2023-05-11 | Discharge: 2023-05-14 | DRG: 871 | Disposition: A | Discharge status: Hospice | Payer: Medicare Other | Attending: Internal Medicine | Admitting: Internal Medicine

## 2023-05-11 ENCOUNTER — Emergency Department: Payer: Medicare Other

## 2023-05-11 DIAGNOSIS — C7931 Secondary malignant neoplasm of brain: Secondary | ICD-10-CM | POA: Diagnosis present

## 2023-05-11 DIAGNOSIS — G9341 Metabolic encephalopathy: Secondary | ICD-10-CM | POA: Diagnosis not present

## 2023-05-11 DIAGNOSIS — Z515 Encounter for palliative care: Secondary | ICD-10-CM

## 2023-05-11 DIAGNOSIS — G936 Cerebral edema: Secondary | ICD-10-CM | POA: Diagnosis present

## 2023-05-11 DIAGNOSIS — N390 Urinary tract infection, site not specified: Secondary | ICD-10-CM | POA: Diagnosis present

## 2023-05-11 DIAGNOSIS — J449 Chronic obstructive pulmonary disease, unspecified: Secondary | ICD-10-CM | POA: Diagnosis present

## 2023-05-11 DIAGNOSIS — Z91013 Allergy to seafood: Secondary | ICD-10-CM

## 2023-05-11 DIAGNOSIS — Z66 Do not resuscitate: Secondary | ICD-10-CM | POA: Diagnosis present

## 2023-05-11 DIAGNOSIS — I3139 Other pericardial effusion (noninflammatory): Secondary | ICD-10-CM | POA: Diagnosis present

## 2023-05-11 DIAGNOSIS — I3131 Malignant pericardial effusion in diseases classified elsewhere: Secondary | ICD-10-CM | POA: Diagnosis present

## 2023-05-11 DIAGNOSIS — R531 Weakness: Secondary | ICD-10-CM | POA: Diagnosis not present

## 2023-05-11 DIAGNOSIS — E860 Dehydration: Secondary | ICD-10-CM

## 2023-05-11 DIAGNOSIS — A419 Sepsis, unspecified organism: Principal | ICD-10-CM | POA: Diagnosis present

## 2023-05-11 DIAGNOSIS — D329 Benign neoplasm of meninges, unspecified: Secondary | ICD-10-CM | POA: Diagnosis present

## 2023-05-11 DIAGNOSIS — E782 Mixed hyperlipidemia: Secondary | ICD-10-CM | POA: Diagnosis present

## 2023-05-11 DIAGNOSIS — Z87891 Personal history of nicotine dependence: Secondary | ICD-10-CM

## 2023-05-11 DIAGNOSIS — N400 Enlarged prostate without lower urinary tract symptoms: Secondary | ICD-10-CM | POA: Diagnosis present

## 2023-05-11 DIAGNOSIS — I6389 Other cerebral infarction: Secondary | ICD-10-CM | POA: Diagnosis present

## 2023-05-11 DIAGNOSIS — I5032 Chronic diastolic (congestive) heart failure: Secondary | ICD-10-CM | POA: Diagnosis present

## 2023-05-11 DIAGNOSIS — R4182 Altered mental status, unspecified: Secondary | ICD-10-CM | POA: Diagnosis not present

## 2023-05-11 DIAGNOSIS — Z8673 Personal history of transient ischemic attack (TIA), and cerebral infarction without residual deficits: Secondary | ICD-10-CM

## 2023-05-11 DIAGNOSIS — R6520 Severe sepsis without septic shock: Secondary | ICD-10-CM | POA: Diagnosis present

## 2023-05-11 DIAGNOSIS — Z6820 Body mass index (BMI) 20.0-20.9, adult: Secondary | ICD-10-CM

## 2023-05-11 DIAGNOSIS — E222 Syndrome of inappropriate secretion of antidiuretic hormone: Secondary | ICD-10-CM | POA: Diagnosis present

## 2023-05-11 DIAGNOSIS — Z85118 Personal history of other malignant neoplasm of bronchus and lung: Secondary | ICD-10-CM

## 2023-05-11 DIAGNOSIS — G9340 Encephalopathy, unspecified: Secondary | ICD-10-CM | POA: Diagnosis not present

## 2023-05-11 DIAGNOSIS — B029 Zoster without complications: Secondary | ICD-10-CM | POA: Diagnosis present

## 2023-05-11 DIAGNOSIS — Z79899 Other long term (current) drug therapy: Secondary | ICD-10-CM

## 2023-05-11 DIAGNOSIS — E43 Unspecified severe protein-calorie malnutrition: Secondary | ICD-10-CM | POA: Diagnosis present

## 2023-05-11 DIAGNOSIS — R410 Disorientation, unspecified: Secondary | ICD-10-CM | POA: Diagnosis not present

## 2023-05-11 DIAGNOSIS — J189 Pneumonia, unspecified organism: Secondary | ICD-10-CM | POA: Diagnosis present

## 2023-05-11 DIAGNOSIS — E871 Hypo-osmolality and hyponatremia: Secondary | ICD-10-CM | POA: Diagnosis not present

## 2023-05-11 DIAGNOSIS — K1230 Oral mucositis (ulcerative), unspecified: Secondary | ICD-10-CM | POA: Diagnosis not present

## 2023-05-11 DIAGNOSIS — C3490 Malignant neoplasm of unspecified part of unspecified bronchus or lung: Secondary | ICD-10-CM | POA: Diagnosis not present

## 2023-05-11 DIAGNOSIS — J440 Chronic obstructive pulmonary disease with acute lower respiratory infection: Secondary | ICD-10-CM | POA: Diagnosis present

## 2023-05-11 DIAGNOSIS — R627 Adult failure to thrive: Secondary | ICD-10-CM | POA: Insufficient documentation

## 2023-05-11 DIAGNOSIS — I110 Hypertensive heart disease with heart failure: Secondary | ICD-10-CM | POA: Diagnosis present

## 2023-05-11 DIAGNOSIS — R401 Stupor: Secondary | ICD-10-CM

## 2023-05-11 DIAGNOSIS — G9349 Other encephalopathy: Secondary | ICD-10-CM | POA: Diagnosis present

## 2023-05-11 DIAGNOSIS — G9389 Other specified disorders of brain: Secondary | ICD-10-CM

## 2023-05-11 DIAGNOSIS — Z7189 Other specified counseling: Secondary | ICD-10-CM | POA: Diagnosis not present

## 2023-05-11 LAB — CBC WITH DIFFERENTIAL/PLATELET
Abs Immature Granulocytes: 0.05 10*3/uL (ref 0.00–0.07)
Basophils Absolute: 0 10*3/uL (ref 0.0–0.1)
Basophils Relative: 0 %
Eosinophils Absolute: 0.8 10*3/uL — ABNORMAL HIGH (ref 0.0–0.5)
Eosinophils Relative: 14 %
HCT: 40.3 % (ref 39.0–52.0)
Hemoglobin: 13 g/dL (ref 13.0–17.0)
Immature Granulocytes: 1 %
Lymphocytes Relative: 12 %
Lymphs Abs: 0.7 10*3/uL (ref 0.7–4.0)
MCH: 28.8 pg (ref 26.0–34.0)
MCHC: 32.3 g/dL (ref 30.0–36.0)
MCV: 89.2 fL (ref 80.0–100.0)
Monocytes Absolute: 0.2 10*3/uL (ref 0.1–1.0)
Monocytes Relative: 3 %
Neutro Abs: 4.1 10*3/uL (ref 1.7–7.7)
Neutrophils Relative %: 70 %
Platelets: 272 10*3/uL (ref 150–400)
RBC: 4.52 MIL/uL (ref 4.22–5.81)
RDW: 16.4 % — ABNORMAL HIGH (ref 11.5–15.5)
WBC: 5.9 10*3/uL (ref 4.0–10.5)
nRBC: 0 % (ref 0.0–0.2)

## 2023-05-11 LAB — COMPREHENSIVE METABOLIC PANEL
ALT: 25 U/L (ref 0–44)
AST: 34 U/L (ref 15–41)
Albumin: 2.2 g/dL — ABNORMAL LOW (ref 3.5–5.0)
Alkaline Phosphatase: 78 U/L (ref 38–126)
Anion gap: 10 (ref 5–15)
BUN: 22 mg/dL (ref 8–23)
CO2: 23 mmol/L (ref 22–32)
Calcium: 7.8 mg/dL — ABNORMAL LOW (ref 8.9–10.3)
Chloride: 100 mmol/L (ref 98–111)
Creatinine, Ser: 1.13 mg/dL (ref 0.61–1.24)
GFR, Estimated: >60 mL/min (ref >60)
Glucose, Bld: 91 mg/dL (ref 70–99)
Potassium: 4.3 mmol/L (ref 3.5–5.1)
Sodium: 133 mmol/L — ABNORMAL LOW (ref 135–145)
Total Bilirubin: 0.8 mg/dL (ref 0.3–1.2)
Total Protein: 6.9 g/dL (ref 6.5–8.1)

## 2023-05-11 LAB — LACTIC ACID, PLASMA
Lactic Acid, Venous: 1.5 mmol/L (ref 0.5–1.9)
Lactic Acid, Venous: 2.1 mmol/L (ref 0.5–1.9)

## 2023-05-11 LAB — PROCALCITONIN: Procalcitonin: <0.1 ng/mL

## 2023-05-11 LAB — AMMONIA: Ammonia: 12 umol/L (ref 9–35)

## 2023-05-11 LAB — LIPASE, BLOOD: Lipase: 20 U/L (ref 11–51)

## 2023-05-11 MED ORDER — VANCOMYCIN HCL IN DEXTROSE 1-5 GM/200ML-% IV SOLN
1000.0000 mg | Freq: Once | INTRAVENOUS | Status: AC
  Administered 2023-05-11: 1000 mg via INTRAVENOUS
  Filled 2023-05-11: qty 200

## 2023-05-11 MED ORDER — ACETAMINOPHEN 325 MG PO TABS: 650.0000 mg | ORAL_TABLET | Freq: Four times a day (QID) | ORAL | Status: DC | PRN

## 2023-05-11 MED ORDER — ACETAMINOPHEN 325 MG RE SUPP: 650.0000 mg | Freq: Four times a day (QID) | RECTAL | Status: DC | PRN

## 2023-05-11 MED ORDER — ONDANSETRON HCL 4 MG/2ML IJ SOLN: 4.0000 mg | Freq: Four times a day (QID) | INTRAMUSCULAR | Status: DC | PRN

## 2023-05-11 MED ORDER — METRONIDAZOLE 500 MG/100ML IV SOLN
500.0000 mg | Freq: Two times a day (BID) | INTRAVENOUS | Status: DC
  Administered 2023-05-11 – 2023-05-12 (×2): 500 mg via INTRAVENOUS
  Filled 2023-05-11 (×2): qty 100

## 2023-05-11 MED ORDER — SODIUM CHLORIDE 0.9 % IV BOLUS (SEPSIS)
500.0000 mL | Freq: Once | INTRAVENOUS | Status: AC
  Administered 2023-05-11: 500 mL via INTRAVENOUS

## 2023-05-11 MED ORDER — ENOXAPARIN SODIUM 30 MG/0.3ML IJ SOSY
30.0000 mg | PREFILLED_SYRINGE | INTRAMUSCULAR | Status: DC
  Administered 2023-05-11 – 2023-05-13 (×3): 30 mg via SUBCUTANEOUS
  Filled 2023-05-11 (×3): qty 0.3

## 2023-05-11 MED ORDER — SENNOSIDES-DOCUSATE SODIUM 8.6-50 MG PO TABS: 1.0000 | ORAL_TABLET | Freq: Every evening | ORAL | Status: DC | PRN

## 2023-05-11 MED ORDER — LORAZEPAM 2 MG/ML IJ SOLN
0.5000 mg | Freq: Four times a day (QID) | INTRAMUSCULAR | Status: DC | PRN
  Administered 2023-05-14: 0.5 mg via INTRAVENOUS
  Filled 2023-05-11: qty 1

## 2023-05-11 MED ORDER — IPRATROPIUM-ALBUTEROL 0.5-2.5 (3) MG/3ML IN SOLN: 3.0000 mL | Freq: Four times a day (QID) | RESPIRATORY_TRACT | Status: AC | PRN

## 2023-05-11 MED ORDER — SODIUM CHLORIDE 0.9 % IV SOLN
2.0000 g | Freq: Two times a day (BID) | INTRAVENOUS | Status: DC
  Administered 2023-05-11 – 2023-05-12 (×2): 2 g via INTRAVENOUS
  Filled 2023-05-11 (×2): qty 12.5

## 2023-05-11 MED ORDER — LACTATED RINGERS IV BOLUS
1000.0000 mL | Freq: Once | INTRAVENOUS | Status: AC
  Administered 2023-05-11: 1000 mL via INTRAVENOUS

## 2023-05-11 MED ORDER — ONDANSETRON HCL 4 MG PO TABS: 4.0000 mg | ORAL_TABLET | Freq: Four times a day (QID) | ORAL | Status: DC | PRN

## 2023-05-11 NOTE — ED Provider Notes (Signed)
[facility] Provider Note    Event Date/Time   First MD Initiated Contact with Patient 05/11/23 1523     (approximate)  History   Chief Complaint: Failure To Thrive  HPI  Cody [name] is a 77 y.o. male with a past medical history of CHF, hypertension, past alcohol dependence, presents to the emergency department for generalized weakness and failure to thrive.  According to the daughter patient was diagnosed with lung cancer approximately 2 to 3 months ago and has been undergoing oral treatments, also last week was diagnosed with shingles and was started on medications for this.  They have noted over the past 1 week that the patient has progressively worsened now to the point where the patient is not eating drinking or really taking his medications over the past 2 days.  Patient appears very somnolent and weak.  Family states he has been acting confused as well for the very little that he does talk.  She states prior to week ago the patient was awake alert walking and talking without much difficulty.  Over the past week due to this progression she states family members have spoken to hospice regarding end-of-life care, but they have not established hospice care as of yet.  Physical Exam   Triage Vital Signs: ED Triage Vitals  Encounter Vitals Group     BP 05/11/23 1433 (!) 155/129     Systolic BP Percentile --      Diastolic BP Percentile --      Pulse Rate 05/11/23 1433 (!) 117     Resp 05/11/23 1433 (!) 24     Temp 05/11/23 1431 98.5 F (36.9 C)     Temp Source 05/11/23 1431 Oral     SpO2 05/11/23 1432 90 %     Weight --      Height --      Head Circumference --      Peak Flow --      Pain Score --      Pain Loc --      Pain Education --      Exclude from Growth Chart --     Most recent vital signs: Vitals:   05/11/23 1438 05/11/23 1438  BP:    Pulse:    Resp:    Temp:    SpO2: (!) 89% 92%    General: Somnolent, will briefly open his eyes when  physically stimulated but then falls back asleep.  Very dry mucous membranes.  No signs of thrush on my exam.  Patient does have a scabbed over rash to the right flank going from the back wraparound to the umbilicus consistent with shingles.  No other dermatomes seem to be involved. CV:  Good peripheral perfusion.  Regular rhythm rate around 100 to 120 bpm. Resp:  Normal effort.  Equal breath sounds bilaterally.  Mild bilateral rhonchi. Abd:  No distention.  Soft, nontender.  No rebound or guarding.  No reaction to abdominal palpation.  ED Results / Procedures / Treatments   EKG  EKG viewed and interpreted by myself shows sinus tachycardia 115 bpm with a narrow QRS, normal axis, normal intervals besides QTc prolongation, nonspecific ST changes.  RADIOLOGY  I have reviewed and interpreted chest x-ray images.  Patient does appear to have blunting of the costophrenic angle on the left lower lung. Radiology has read the x-ray as increased left basilar consolidation with small left pleural effusion.   MEDICATIONS ORDERED IN ED: Medications  lactated ringers  bolus 1,000 mL (has no administration in time range)     IMPRESSION / MDM / ASSESSMENT AND PLAN / ED COURSE  I reviewed the triage vital signs and the nursing notes.  Patient's presentation is most consistent with acute presentation with potential threat to life or bodily function.  Patient presents to the emergency department for generalized weakness sleeping throughout most the day eating and drinking very little over the past 2 to 3 days per family.  Patient appears dehydrated with very dry mucous membranes tachycardic.  We will check labs and begin IV hydration.  Daughter states that the patient has had a progressive decline over at least the past 1 week to the point where the patient is now not eating and drinking or awake much.  For the time that he is awake he seems confused if he does try to talk but for the most part does not talk.   As the patient has not yet established care with hospice I believe it would be reasonable to admit the patient and his severely deconditioned state to the hospital to help establish hospice care if that is the direction the family wishes to proceed with.  Family for now is agreeable to medical workup and fluids.  Patient's workup shows mild lactic acidosis of 2.1, CBC is reassuring, chemistry overall reassuring with anion gap of 10, lipase is normal.  Patient's chest x-ray is clear.  However given the patient's deconditioned state weakness and signs of clinical dehydration we will admit to the hospital service for further workup treatment and possibly conversion to hospice care comfort measures.  FINAL CLINICAL IMPRESSION(S) / ED DIAGNOSES   Weakness Dehydration   Note:  This document was prepared using Dragon voice recognition software and may include unintentional dictation errors.   Minna Antis, MD 05/11/23 [phone]

## 2023-05-11 NOTE — ED Triage Notes (Addendum)
Pt family states pt has been weak, not eating, and non-interactive, as well as recently diagnosed with shingles (on lower right side abdomen). Pt is responsive to verbal stimulation. Pt unable to speak clearly. Pt currently has lung cancer.

## 2023-05-11 NOTE — Assessment & Plan Note (Signed)
Etiology workup in progress, differentials include severe sepsis versus progression of non-small cell carcinoma with brain metastasis Prognosis is poor Check ammonia level, lactic acid x 2, blood cultures x 2, procalcitonin, UA Admit to PCU, inpatient

## 2023-05-11 NOTE — Assessment & Plan Note (Signed)
Status post 1 week course of acyclovir

## 2023-05-11 NOTE — Hospital Course (Signed)
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  Vitals in the ED showed temperature of 98.5, respiration rate of 24, heart rate of 117, blood pressure 155/129, SpO2 of 92% on 2 L nasal cannula.  Serum sodium is 133, potassium 4.3, chloride 100, bicarb 23, BUN of 22, serum creatinine of 1.13, EGFR greater than 60, nonfasting blood glucose 91, WBC 5.9, hemoglobin 13, platelets of 272.  Lipase was 20.  Portable chest x-ray ordered by EDP: Read as increased left basilar consolidation with small left pleural effusion.  ED treatment: LR 1 L bolus.

## 2023-05-11 NOTE — ED Notes (Signed)
Noted that the pt does have sleep apnea with 10 second pauses. Family reports that this is normal for patient.

## 2023-05-11 NOTE — ED Triage Notes (Signed)
First Nurse: Pt presents via EMS c/o weakness and decreased appetite. Reports spoke to hospice yesterday but pt is not currently on hospice care.

## 2023-05-11 NOTE — Assessment & Plan Note (Addendum)
Does not appear to be in acute exacerbation at this time DuoNebs every 6 hours as needed for shortness of breath and wheezing, 2 days ordered

## 2023-05-11 NOTE — Assessment & Plan Note (Addendum)
Palliative care has been consulted Family would like to try IV fluid and antibiotic, medical workup, for the next 24 to 48 hours If patient's condition remains unchanged and or deteriorates, family may decide on comfort measures/hospice/end-of-life care at that time

## 2023-05-11 NOTE — Assessment & Plan Note (Addendum)
Patient meets SIRS criteria with increased respiration Rate, increased heart rate Suspected infectious process includes possible bacteremia, pneumonia, urinary tract infection Organ dysfunction is encephalopathy, altered mental status Etiology source workup in progress: check procalcitonin, blood cultures x 2, lactic acid x 2 UA ordered by EDP and pending collection at the time of this dictation Check ammonia level Patient is status post LR 1 L bolus per EDP Ordered additional sodium chloride 500 mL IV on admission Broad-spectrum antibiotics include cefepime and vancomycin per pharmacy, metronidazole 500 mg IV twice daily Admit to PCU, inpatient

## 2023-05-11 NOTE — Assessment & Plan Note (Signed)
Secondary to SIADH

## 2023-05-11 NOTE — H&P (Addendum)
History and Physical   Cody [name] WUJ:[id number] DOB: [birth year] DOA: 05/11/2023  PCP: Orson Eva, NP  Outpatient Specialists: Dr. Cathie Hoops, medical oncology Patient coming from: Home via EMS  I have personally briefly reviewed patient's old medical records in [facility] EMR.  Chief Concern: Altered mental status, weakness, no appetite  HPI: 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  Vitals in the ED showed temperature of 98.5, respiration rate of 24, heart rate of 117, blood pressure 155/129, SpO2 of 92% on 2 L nasal cannula.  Serum sodium is 133, potassium 4.3, chloride 100, bicarb 23, BUN of 22, serum creatinine of 1.13, EGFR greater than 60, nonfasting blood glucose 91, WBC 5.9, hemoglobin 13, platelets of 272.  Lipase was 20.  Portable chest x-ray ordered by EDP: Read as increased left basilar consolidation with small left pleural effusion.  ED treatment: LR 1 L bolus. ----------------------- At bedside, patient is acutely ill, lethargic.  He was able to murmur his name for me at bedside and he recognize his spouse.  I could not understand his response in regards to his age.  His spouse and daughter were at bedside.  They report that since Sunday, 05/05/2023, he has had worsening weakness and has not tolerated p.o. intake including fluids.  This was since his diagnosis of shingles.  He was diagnosed with lung cancer about 2 months ago.  They are aware of the brain metastasis.  They deny fever.  He had 1 day on Sunday, 7/7 of 4 episodes of diarrhea which has now ceased.  Family denies vomiting.  At bedside patient states that he has not wanted to eat  or drink at bedside because his throat hurts.  Social history: He lives at home with his wife.  He is a former tobacco and EtOH user.  They deny recreational drug use.  ROS: Unable to complete as patient has altered mental status in setting of possible sepsis complicated by lung cancer with brain metastasis.  ED Course: Discussed with emergency medicine provider, patient requiring hospitalization for chief concerns of altered mental status.  Assessment/Plan  Principal Problem:   Altered mental status Active Problems:   SIRS due to infectious process with acute organ dysfunction (HCC)   Hyponatremia   Acute encephalopathy   Brain mass   Pericardial effusion   Chronic obstructive pulmonary disease (HCC)   BPH (benign prostatic hyperplasia)   Mixed hyperlipidemia   Pneumonia of left lower lobe due to infectious organism   Metastasis to brain [facility])   Metastatic non-small cell lung cancer (HCC)   Shingles   Failure to thrive in adult   Protein-calorie malnutrition, severe (HCC)   Assessment and Plan:  * Altered mental status Etiology workup in progress, differentials include severe sepsis versus progression of non-small cell carcinoma with brain metastasis Prognosis is poor Check ammonia level, lactic acid x 2, blood cultures x 2, procalcitonin, UA Admit to PCU, inpatient  SIRS due to infectious process with acute organ dysfunction [facility]) Patient meets SIRS criteria with increased respiration Rate, increased heart rate Suspected infectious process includes possible bacteremia, pneumonia, urinary tract infection Organ dysfunction is encephalopathy, altered mental status Etiology source workup in progress: check procalcitonin, blood cultures x 2, lactic acid x  2 UA ordered by EDP and pending collection at the time of this dictation Check ammonia level Patient is status post LR 1 L bolus per EDP Ordered additional sodium chloride 500 mL IV on admission Broad-spectrum antibiotics  include cefepime and vancomycin per pharmacy, metronidazole 500 mg IV twice daily Admit to PCU, inpatient  Hyponatremia Secondary to SIADH  Chronic obstructive pulmonary disease (HCC) Does not appear to be in acute exacerbation at this time DuoNebs every 6 hours as needed for shortness of breath and wheezing, 2 days ordered  Failure to thrive in adult Palliative care has been consulted Family would like to try IV fluid and antibiotic, medical workup, for the next 24 to 48 hours If patient's condition remains unchanged and or deteriorates, family may decide on comfort measures/hospice/end-of-life care at that time  Shingles Status post 1 week course of acyclovir  Metastatic non-small cell lung cancer (HCC) With brain metastasis with multiple lesions including right frontal lobe, left occipital lobe, superior right cerebellum with surrounding vasogenic edema Malignant pericardial effusion Poor prognosis  Pneumonia of left lower lobe due to infectious organism Check procalcitonin  Chart reviewed.   DVT prophylaxis: Enoxaparin subcutaneous Code Status: DNR, confirmed with patient's spouse and daughter [name] at bedside Diet: n.p.o. due to increased aspiration risk Family Communication: Updated patient's spouse and his daughter, [name] at bedside Disposition Plan: Poor prognosis Consults called: Palliative care Admission status: PCU, inpatient  Past Medical History:  Diagnosis Date   Alcohol dependence (HCC)    CHF (congestive heart failure) (HCC)    Hypertension    Tobacco abuse    Past Surgical History:  Procedure Laterality Date   ABDOMINAL SURGERY     APPENDECTOMY     LEFT HEART CATH AND CORONARY ANGIOGRAPHY N/A 08/25/2018   Procedure: LEFT HEART CATH AND CORONARY ANGIOGRAPHY;  Surgeon: Laurier Nancy, MD;  Location: [facility] INVASIVE CV LAB;  Service: Cardiovascular;  Laterality: N/A;   PERICARDIOCENTESIS N/A 03/06/2023   Procedure: PERICARDIOCENTESIS;  Surgeon:  Marcina Millard, MD;  Location: [facility] INVASIVE CV LAB;  Service: Cardiovascular;  Laterality: N/A;   Social History:  reports that he quit smoking about 2 months ago. His smoking use included cigarettes. He started smoking about 50 years ago. He has a 25 pack-year smoking history. He has been exposed to tobacco smoke. He has never used smokeless tobacco. He reports that he does not currently use alcohol. He reports that he does not use drugs.  Allergies  Allergen Reactions   Shellfish Allergy Hives    Only allergic to crabs   History reviewed. No pertinent family history.  Family denies any known family medical history.  Family history: Family history reviewed and not pertinent.  Prior to Admission medications   Medication Sig Start Date End Date Taking? Authorizing Provider  allopurinol (ZYLOPRIM) 100 MG tablet Take 1 tablet (100 mg total) by mouth daily. 03/12/23 03/11/24  Gillis Santa, MD  dexamethasone (DECADRON) 1 MG tablet Take 1 tablet (1 mg total) by mouth daily with breakfast. 04/11/23   Rickard Patience, MD  finasteride (PROSCAR) 5 MG tablet Take 1 tablet (5 mg total) by mouth daily. 04/18/23   Sondra Come, MD  folic acid (FOLVITE) 1 MG tablet Take 1 tablet (1 mg total) by mouth daily. 03/13/23 06/11/23  Gillis Santa, MD  gabapentin (NEURONTIN) 100 MG capsule Take 1 capsule (100 mg total) by mouth daily. 05/10/23 05/09/24  Margaretann Loveless, MD  iron polysaccharides (NIFEREX) 150 MG capsule Take 1 capsule (150 mg  total) by mouth daily. 03/13/23 06/11/23  Gillis Santa, MD  Lactulose 20 GM/30ML SOLN Take 30 mLs (20 g total) by mouth 2 (two) times daily. 04/16/23   Orson Eva, NP  levETIRAcetam (KEPPRA) 500 MG tablet Take 1 tablet (500 mg total) by mouth 2 (two) times daily. 03/12/23 09/08/23  Gillis Santa, MD  loperamide (IMODIUM) 2 MG capsule Take 1 capsule (2 mg total) by mouth See admin instructions. Initial: 4 mg,the 2 mg every 2 hours (4 mg every 4 hours at night)  maximum: 16 mg/day  04/11/23   Rickard Patience, MD  magic mouthwash (nystatin, lidocaine, diphenhydrAMINE, alum & mag hydroxide) suspension Swish and spit 5 mLs 4 (four) times daily as needed for mouth pain. 05/10/23   Rushie Chestnut, PA-C  mirtazapine (REMERON) 15 MG tablet Take 1 tablet (15 mg total) by mouth at bedtime. 05/10/23 05/09/24  Margaretann Loveless, MD  Multiple Vitamin (MULTIVITAMIN WITH MINERALS) TABS tablet Take 1 tablet by mouth daily. 03/13/23 06/11/23  Gillis Santa, MD  nystatin (MYCOSTATIN) 100000 UNIT/ML suspension Take 5 mLs (500,000 Units total) by mouth 4 (four) times daily. 05/10/23   Margaretann Loveless, MD  polyethylene glycol (MIRALAX / GLYCOLAX) 17 g packet Take 17 g by mouth daily as needed for mild constipation or moderate constipation. 03/12/23   Gillis Santa, MD  prochlorperazine (COMPAZINE) 10 MG tablet Take 1 tablet (10 mg total) by mouth every 6 (six) hours as needed for nausea or vomiting. 04/11/23   Rickard Patience, MD  selpercatinib (RETEVMO) 40 MG capsule Take 3 capsules (120 mg total) by mouth 2 (two) times daily. 05/03/23   Rickard Patience, MD  tamsulosin (FLOMAX) 0.4 MG CAPS capsule Take 1 capsule (0.4 mg total) by mouth daily. 04/18/23   Sondra Come, MD  thiamine (VITAMIN B-1) 100 MG tablet Take 1 tablet (100 mg total) by mouth daily. 03/13/23 06/11/23  Gillis Santa, MD  traMADol (ULTRAM) 50 MG tablet Take 1 tablet (50 mg total) by mouth every 12 (twelve) hours as needed. 04/26/23   Margaretann Loveless, MD  Vitamin D, Ergocalciferol, (DRISDOL) 1.25 MG (50000 UNIT) CAPS capsule Take 1 capsule (50,000 Units total) by mouth every 7 (seven) days. 03/17/23 06/15/23  Gillis Santa, MD   Physical Exam: Vitals:   05/11/23 1700 05/11/23 1800 05/11/23 1830 05/11/23 1920  BP: 123/79 139/85 119/71   Pulse: (!) 115 (!) 111 (!) 112   Resp: (!) 25 (!) 23 20   Temp:    97.9 F (36.6 C)  TempSrc:    Oral  SpO2: 96% 98% 97%    Constitutional: appears frail, cachectic, acutely ill Eyes: PERRL, lids and conjunctivae  normal ENMT: Mucous membranes are dry. Posterior pharynx was not able to be fully visualized as patient could not open his mouth widely.  Poor dentition with multiple teeth loss.  Unable to assess hearing. Neck: normal, supple, no masses, no thyromegaly Respiratory: clear to auscultation bilaterally, no wheezing, no crackles. Normal respiratory effort. No accessory muscle use.  Nasal cannula in place. Cardiovascular: Regular rate and rhythm, no murmurs / rubs / gallops. No extremity edema. 2+ pedal pulses. No carotid bruits.  Abdomen: Generalized tenderness, no masses palpated, no hepatosplenomegaly. Bowel sounds positive.  Musculoskeletal: no clubbing / cyanosis. No joint deformity upper and lower extremities. Good ROM, no contractures, no atrophy. Normal muscle tone.  Skin: no rashes, lesions, ulcers. No induration.  Shingles on the right abdomen Neurologic: Unable to assess sensation and strength due to altered mental status  Psychiatric: Unable to assess judgment and insight. Alert and oriented x self.  Depressed mood.   EKG: independently reviewed, showing sinus tachycardia with rate of 115, QTc 614  Chest x-ray on Admission: I personally reviewed and I agree with radiologist reading as below.  DG Chest Portable 1 View  Result Date: 05/11/2023 CLINICAL DATA:  Lung cancer EXAM: PORTABLE CHEST 1 VIEW COMPARISON:  04/26/2023 FINDINGS: Cardiomegaly. Aortic atherosclerosis. Increased left basilar consolidation with small left pleural effusion. No pneumothorax. IMPRESSION: Increased left basilar consolidation with small left pleural effusion. Electronically Signed   By: Duanne Guess D.O.   On: 05/11/2023 15:41    Labs on Admission: I have personally reviewed following labs  CBC: Recent Labs  Lab 05/10/23 1404 05/11/23 1622  WBC 6.3 5.9  NEUTROABS 4.7 4.1  HGB 12.6* 13.0  HCT 38.7* 40.3  MCV 88.6 89.2  PLT 295 272   Basic Metabolic Panel: Recent Labs  Lab 05/10/23 1404  05/11/23 1622  NA 130* 133*  K 4.3 4.3  CL 98 100  CO2 22 23  GLUCOSE 114* 91  BUN 17 22  CREATININE 0.99 1.13  CALCIUM 8.6* 7.8*   GFR: Estimated Creatinine Clearance: 35.9 mL/min (by C-G formula based on SCr of 1.13 mg/dL).  Liver Function Tests: Recent Labs  Lab 05/10/23 1404 05/11/23 1622  AST 29 34  ALT 25 25  ALKPHOS 79 78  BILITOT 0.3 0.8  PROT 6.9 6.9  ALBUMIN 2.2* 2.2*   Recent Labs  Lab 05/11/23 1622  LIPASE 20   Urine analysis:    Component Value Date/Time   APPEARANCEUR Clear 08/31/2021 0937   GLUCOSEU Negative 08/31/2021 0937   BILIRUBINUR Negative 08/31/2021 0937   PROTEINUR Negative 08/31/2021 0937   NITRITE Negative 08/31/2021 0937   LEUKOCYTESUR Negative 08/31/2021 0937   CRITICAL CARE Performed by: Dr. Sedalia Muta  Total critical care time: 32 minutes  Critical care time was exclusive of separately billable procedures and treating other patients.  Critical care was necessary to treat or prevent imminent or life-threatening deterioration.  Critical care was time spent personally by me on the following activities: development of treatment plan with patient and/or surrogate as well as nursing, discussions with consultants, evaluation of patient's response to treatment, examination of patient, obtaining history from patient or surrogate, ordering and performing treatments and interventions, ordering and review of laboratory studies, ordering and review of radiographic studies, pulse oximetry and re-evaluation of patient's condition.  This document was prepared using Dragon Voice Recognition software and may include unintentional dictation errors.  Dr. Sedalia Muta [facility]  If 7PM-7AM, please contact overnight-coverage provider If 7AM-7PM, please contact day attending provider [email or web address]  05/11/2023, 7:28 PM

## 2023-05-11 NOTE — Progress Notes (Signed)
PHARMACIST - PHYSICIAN COMMUNICATION  CONCERNING:  Enoxaparin (Lovenox) for DVT Prophylaxis    RECOMMENDATION: Patient was prescribed enoxaprin 40mg  q24 hours for VTE prophylaxis.   There were no vitals filed for this visit.  There is no height or weight on file to calculate BMI.  Estimated Creatinine Clearance: 35.9 mL/min (by C-G formula based on SCr of 1.13 mg/dL).   Patient is candidate for enoxaparin 30mg  every 24 hours based on CrCl <76ml/min or Weight <45kg  DESCRIPTION: Pharmacy has adjusted enoxaparin dose per [facility] policy.  Patient is now receiving enoxaparin 30 mg every 24 hours    Barrie Folk, PharmD Clinical Pharmacist  05/11/2023 6:37 PM

## 2023-05-11 NOTE — Assessment & Plan Note (Signed)
With brain metastasis with multiple lesions including right frontal lobe, left occipital lobe, superior right cerebellum with surrounding vasogenic edema Malignant pericardial effusion Poor prognosis

## 2023-05-11 NOTE — ED Notes (Signed)
Pt is currently sleeping. Daughter and wife remain at bedside. Pt on VS monitor.

## 2023-05-11 NOTE — Consult Note (Signed)
Pharmacy Antibiotic Note  Cody [name] is a 77 y.o. male admitted on 05/11/2023 with sepsis.  Pharmacy has been consulted for cefepime and vancomycin dosing.  Plan: Start Cefepime 2 grams IV every 12 hours Give vancomycin 1000 mg IV x 1, then start vancomycin 500 mg IV every 24 hours Vancomycin levels at steady state or as clinically indicated Flagyl 500 mg IV every 12 hours per provider     Temp (24hrs), Avg:98.5 F (36.9 C), Min:98.5 F (36.9 C), Max:98.5 F (36.9 C)  Recent Labs  Lab 05/10/23 1404 05/11/23 1622  WBC 6.3 5.9  CREATININE 0.99 1.13    Estimated Creatinine Clearance: 35.9 mL/min (by C-G formula based on SCr of 1.13 mg/dL).    Allergies  Allergen Reactions   Shellfish Allergy Hives    Only allergic to crabs    Antimicrobials this admission: Vancomycin 7/13 >>  Cefepime 7/13 >>  Flagyl 7/13>>  Dose adjustments this admission: N/A  Microbiology results: 7/13 BCx: pending  Thank you for allowing pharmacy to be a part of this patient's care.  Barrie Folk, PharmD 05/11/2023 6:44 PM

## 2023-05-11 NOTE — Consult Note (Signed)
CODE SEPSIS - PHARMACY COMMUNICATION  **Broad Spectrum Antibiotics should be administered within 1 hour of Sepsis diagnosis**  Time Code Sepsis Called/Page Received: 1835  Antibiotics Ordered: cefepime, vancomycin, and Flagyl  Time of 1st antibiotic administration: 1906  Additional action taken by pharmacy: N/A  Barrie Folk ,PharmD Clinical Pharmacist  05/11/2023  7:09 PM

## 2023-05-11 NOTE — ED Notes (Signed)
Changed pt's brief. Minimal urine.

## 2023-05-11 NOTE — Assessment & Plan Note (Signed)
-   Check procalcitonin 

## 2023-05-11 NOTE — ED Notes (Signed)
Attempted x4 to get labs and start another IV. Lab called to draw the blood for the patient.

## 2023-05-12 ENCOUNTER — Inpatient Hospital Stay: Payer: Medicare Other

## 2023-05-12 DIAGNOSIS — C3490 Malignant neoplasm of unspecified part of unspecified bronchus or lung: Secondary | ICD-10-CM | POA: Diagnosis not present

## 2023-05-12 DIAGNOSIS — A419 Sepsis, unspecified organism: Secondary | ICD-10-CM | POA: Diagnosis not present

## 2023-05-12 DIAGNOSIS — E860 Dehydration: Secondary | ICD-10-CM

## 2023-05-12 DIAGNOSIS — E871 Hypo-osmolality and hyponatremia: Secondary | ICD-10-CM

## 2023-05-12 DIAGNOSIS — R410 Disorientation, unspecified: Secondary | ICD-10-CM | POA: Diagnosis not present

## 2023-05-12 DIAGNOSIS — C7931 Secondary malignant neoplasm of brain: Secondary | ICD-10-CM

## 2023-05-12 DIAGNOSIS — E43 Unspecified severe protein-calorie malnutrition: Secondary | ICD-10-CM | POA: Diagnosis not present

## 2023-05-12 DIAGNOSIS — Z7189 Other specified counseling: Secondary | ICD-10-CM

## 2023-05-12 DIAGNOSIS — R531 Weakness: Secondary | ICD-10-CM | POA: Diagnosis not present

## 2023-05-12 LAB — CBC
HCT: 39 % (ref 39.0–52.0)
Hemoglobin: 12.5 g/dL — ABNORMAL LOW (ref 13.0–17.0)
MCH: 29.1 pg (ref 26.0–34.0)
MCHC: 32.1 g/dL (ref 30.0–36.0)
MCV: 90.9 fL (ref 80.0–100.0)
Platelets: 226 10*3/uL (ref 150–400)
RBC: 4.29 MIL/uL (ref 4.22–5.81)
RDW: 16.6 % — ABNORMAL HIGH (ref 11.5–15.5)
WBC: 7 10*3/uL (ref 4.0–10.5)
nRBC: 0 % (ref 0.0–0.2)

## 2023-05-12 LAB — BASIC METABOLIC PANEL
Anion gap: 11 (ref 5–15)
BUN: 22 mg/dL (ref 8–23)
CO2: 17 mmol/L — ABNORMAL LOW (ref 22–32)
Calcium: 7.2 mg/dL — ABNORMAL LOW (ref 8.9–10.3)
Chloride: 104 mmol/L (ref 98–111)
Creatinine, Ser: 0.83 mg/dL (ref 0.61–1.24)
GFR, Estimated: >60 mL/min (ref >60)
Glucose, Bld: 73 mg/dL (ref 70–99)
Potassium: 4.3 mmol/L (ref 3.5–5.1)
Sodium: 132 mmol/L — ABNORMAL LOW (ref 135–145)

## 2023-05-12 LAB — CORTISOL-AM, BLOOD: Cortisol - AM: 17.6 ug/dL (ref 6.7–22.6)

## 2023-05-12 MED ORDER — SODIUM CHLORIDE 0.9 % IV SOLN
1.0000 g | INTRAVENOUS | Status: DC
  Administered 2023-05-12 – 2023-05-13 (×2): 1 g via INTRAVENOUS
  Filled 2023-05-12 (×2): qty 10

## 2023-05-12 MED ORDER — SODIUM CHLORIDE 0.9 % IV SOLN: INTRAVENOUS | Status: AC

## 2023-05-12 MED ORDER — METOPROLOL TARTRATE 5 MG/5ML IV SOLN
5.0000 mg | INTRAVENOUS | Status: DC | PRN
  Administered 2023-05-12: 5 mg via INTRAVENOUS
  Filled 2023-05-12: qty 5

## 2023-05-12 NOTE — Progress Notes (Addendum)
[facility]  - [place] at [facility]   PATIENT NAME: Cody [name]    MR#:  [id number]  DATE OF BIRTH:  28-Feb-1946  SUBJECTIVE:  patient seen in the ER. Two daughters at bedside. Met along with palliative care nurse practitioner Leeanne Deed. Family brought inpatient with failure to thrive, poor PO intake, increasing confusion, memory loss for last several days. Recently diagnosed about two months ago with metastatic non-small cell lung cancer. Patient underwent full brain radiation due to metastatic lesions with vasogenic edema  Per family patient is total care. Has not been able to ambulate at home as well. He recognizes family members however has very short-term memory and overall decline with weakness.  VITALS:  Blood pressure 108/66, pulse (!) 109, temperature 97.9 F (36.6 C), temperature source Axillary, resp. rate 20, SpO2 98%.  PHYSICAL EXAMINATION:  Limited  GENERAL:  77 y.o.-year-old patient with no acute distress. Confused LUNGS: Normal breath sounds bilaterally, no wheezing CARDIOVASCULAR: S1, S2 normal. No murmur   ABDOMEN: Soft, nontender, nondistended.   EXTREMITIES: No  edema b/l.    NEUROLOGIC: moving all extremities. Patient moaning and keeping eyes closed. Not able to have meaningful conversation  LABORATORY PANEL:  CBC Recent Labs  Lab 05/12/23 0503  WBC 7.0  HGB 12.5*  HCT 39.0  PLT 226    Chemistries  Recent Labs  Lab 05/11/23 1622 05/12/23 0503  NA 133* 132*  K 4.3 4.3  CL 100 104  CO2 23 17*  GLUCOSE 91 73  BUN 22 22  CREATININE 1.13 0.83  CALCIUM 7.8* 7.2*  AST 34  --   ALT 25  --   ALKPHOS 78  --   BILITOT 0.8  --    Cardiac Enzymes No results for input(s): "TROPONINI" in the last 168 hours. RADIOLOGY:  CT HEAD WO CONTRAST ( )  Result Date: 05/12/2023 CLINICAL DATA:  77 year old male with altered mental status. Metastatic non-small cell lung cancer. Whole brain radiation treatment was planned for late May/June.  EXAM: CT HEAD WITHOUT CONTRAST TECHNIQUE: Contiguous axial images were obtained from the base of the skull through the vertex without intravenous contrast. RADIATION DOSE REDUCTION: This exam was performed according to the departmental dose-optimization program which includes automated exposure control, adjustment of the mA and/or kV according to patient size and/or use of iterative reconstruction technique. COMPARISON:  Brain MRI 03/05/2023. head CT 03/07/2023. FINDINGS: Brain: Brain volume does not appear significantly changed. Regressed or resolved multifocal bilateral vasogenic edema when compared to May. Superimposed chronic left cerebellar infarct. Residual mild to moderate nonspecific patchy white matter hypodensity. No intracranial mass effect or midline shift. No acute intracranial hemorrhage identified. No cortically based acute infarct identified. Vascular: Calcified atherosclerosis at the skull base. No suspicious intracranial vascular hyperdensity. Skull: Stable.  No acute or suspicious osseous lesion. Sinuses/Orbits: Visualized paranasal sinuses and mastoids are stable and well aerated. Other: No acute orbit or scalp soft tissue finding. IMPRESSION: 1. Noncontrast CT evidence of regressed cerebral metastatic disease since May, possibly following whole brain radiation. 2. No new intracranial abnormality identified. Chronic cerebellar ischemia. Electronically Signed   By: Odessa Fleming M.D.   On: 05/12/2023 12:17   DG Chest Portable 1 View  Result Date: 05/11/2023 CLINICAL DATA:  Lung cancer EXAM: PORTABLE CHEST 1 VIEW COMPARISON:  04/26/2023 FINDINGS: Cardiomegaly. Aortic atherosclerosis. Increased left basilar consolidation with small left pleural effusion. No pneumothorax. IMPRESSION: Increased left basilar consolidation with small left pleural effusion. Electronically Signed   By: Duanne Guess  D.O.   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   He was recently diagnosed with shingles. Was on oral antiviral meds.  chest x-ray increased left basilar consolidation with small left pleural effusion.   CT head-Noncontrast CT evidence of regressed cerebral metastatic disease since May, possibly following whole brain radiation. 2. No new intracranial abnormality identified. Chronic cerebellar ischemia.  SIRS due to ?infectious process vs progressive Metastatic Non small cell lung cancer with acute organ dysfunction (HCC) Left LL pneumonia /pleural effusion --Patient meets SIRS criteria with increased respiration Rate, increased heart rate --Suspected infectious process includes possible bacteremia, pneumonia, urinary tract infection --Organ dysfunction is encephalopathy, altered mental status --- patient on IV fluids. -- Continue empiric antibiotics for now -- Pro calcitonin negative. White count normal.   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 This was discussed at length with patient's two daughters in the ER along with palliative care nurse practitioner.   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  >50% time spent on counselling and coordination of care  Note: This dictation was prepared with Dragon dictation along with smaller phrase technology. Any transcriptional errors that result from this process are unintentional.  Enedina Finner M.D    [facility]   CC: Primary care physician; Orson Eva, NP

## 2023-05-12 NOTE — ED Notes (Signed)
Pt HR jumping into the 160's, Dr. Allena Katz made aware.

## 2023-05-12 NOTE — Consult Note (Signed)
Consultation Note Date: 05/12/2023   Patient Name: Cody [name]  DOB: [birth year]  MRN: [id number]  Age / Sex: 77 y.o., male  PCP: Orson Eva, NP Referring Physician: Enedina Finner, MD  Reason for Consultation: Establishing goals of care   HPI/Brief Hospital Course: 77 y.o. male  with past medical history of stage IV non-small cell carcinoma with brain mets, malignant pleural and pericardial effusions, alcohol and tobacco abuse admitted from home on 05/11/2023 with concerns for increased weakness, decreased appetite and failure to thrive.   Noted recent shingles diagnosis, completed extended course of acyclovir  Palliative medicine was consulted for assisting with goals of care conversations.  Subjective:  Extensive chart review has been completed prior to meeting patient including labs, vital signs, imaging, progress notes, orders, and available advanced directive documents from current and previous encounters.  Accompanied Cody [name] to visit with Cody [name] at his bedside. Two daughters present at bedside during time of visit. Declined use of interpreter and decline utilizing interpreter to engage with Cody [name]. According to daughters, Cody [name] confused at this time, able to recognize his family but unable to have engaged conversations. Daughters note a significant functional and mental decline in the last several days.  Introduced myself as a Publishing rights manager as a member of the palliative care team. Explained palliative medicine is specialized medical care for people living with serious illness. It focuses on providing relief from the symptoms and stress of a serious illness. The goal is to improve quality of life for both the patient and the family.   Cody [name] provided family with updates related to current medical condition. Overall poor prognosis related to metastatic lung disease, failure to thrive and significant functional decline. Cody [name] discussed  transition to comfort care/hospice care.  Further discussions had with daughters, they share Cody [name] has a spouse, their step-mother, she does not speak [place], they decline me calling and utilizing interpreter services to communicate with her. They wish to communicate with family themselves. We talk in depth about the overall philosophy of hospice and difference between hospice services in the home versus IPU. Family would prefer to have Cody [name] at home but are concerned about the amount of care he will require. Shared hospice services unable to provide around the clock care in the home.  At this time, daughters wish to continue current treatment plan. Request time to have conversations with their multiple siblings (11 total) and their step-mother. Will have oncology team including Cody Munroe, NP follow-up tomorrow as able.  All questions/concerns addressed. Emotional support provided to patient/family/support persons. PMT will continue to follow and support patient as needed.  Objective: Primary Diagnoses: Present on Admission:  Altered mental status  Acute encephalopathy  BPH (benign prostatic hyperplasia)  Chronic obstructive pulmonary disease (HCC)  Metastasis to brain (HCC)  Metastatic non-small cell lung cancer (HCC)  Shingles  Pneumonia of left lower lobe due to infectious organism  Pericardial effusion  Hyponatremia  Mixed hyperlipidemia   Physical Exam Constitutional:      General: He is not in acute distress.    Appearance: He is ill-appearing.  Pulmonary:     Effort: Pulmonary effort is normal. No respiratory distress.  Neurological:     Mental Status: He is disoriented.     Motor: Weakness present.     Vital Signs: BP (!) 81/52   Pulse 60   Temp 97.9 F (36.6 C) (Axillary)   Resp 13   SpO2 100%  Pain Scale: CPOT  Pain Score: Asleep  IO: Intake/output summary:  Intake/Output Summary (Last 24 hours) at 05/12/2023 1632 Last data filed at 05/11/2023  2133 Gross per 24 hour  Intake 1900 ml  Output --  Net 1900 ml    Assessment and Plan  SUMMARY OF RECOMMENDATIONS   Confirmed DNR/DNI with family at bedside Recommend comfort care/hospice care, family requesting time for processing and making final decisions PMT to follow-up and engage hospice as appropriate  Discussed With: Primary team   Thank you for this consult and allowing Palliative Medicine to participate in the care of Cody [name]. Palliative medicine will continue to follow and assist as needed.   Time Total: 55 minutes  Time spent includes: Detailed review of medical records (labs, imaging, vital signs), medically appropriate exam (mental status, respiratory, cardiac, skin), discussed with treatment team, counseling and educating patient, family and staff, documenting clinical information, medication management and coordination of care.   Signed by: Leeanne Deed, DNP, AGNP-C Palliative Medicine    Please contact Palliative Medicine Team phone at [phone] for questions and concerns.  For individual provider: See Loretha Stapler

## 2023-05-12 NOTE — ED Notes (Signed)
Pt returned from CT °

## 2023-05-13 ENCOUNTER — Inpatient Hospital Stay: Payer: Medicare Other

## 2023-05-13 ENCOUNTER — Ambulatory Visit: Payer: Medicare Other | Admitting: Cardiology

## 2023-05-13 DIAGNOSIS — R627 Adult failure to thrive: Secondary | ICD-10-CM | POA: Diagnosis not present

## 2023-05-13 DIAGNOSIS — G9340 Encephalopathy, unspecified: Secondary | ICD-10-CM

## 2023-05-13 DIAGNOSIS — I3139 Other pericardial effusion (noninflammatory): Secondary | ICD-10-CM | POA: Diagnosis not present

## 2023-05-13 DIAGNOSIS — R4182 Altered mental status, unspecified: Secondary | ICD-10-CM

## 2023-05-13 DIAGNOSIS — C3490 Malignant neoplasm of unspecified part of unspecified bronchus or lung: Secondary | ICD-10-CM | POA: Diagnosis not present

## 2023-05-13 DIAGNOSIS — A419 Sepsis, unspecified organism: Secondary | ICD-10-CM | POA: Diagnosis not present

## 2023-05-13 DIAGNOSIS — R410 Disorientation, unspecified: Secondary | ICD-10-CM | POA: Diagnosis not present

## 2023-05-13 DIAGNOSIS — Z515 Encounter for palliative care: Secondary | ICD-10-CM

## 2023-05-13 DIAGNOSIS — R531 Weakness: Secondary | ICD-10-CM | POA: Diagnosis not present

## 2023-05-13 DIAGNOSIS — K1230 Oral mucositis (ulcerative), unspecified: Secondary | ICD-10-CM

## 2023-05-13 DIAGNOSIS — E871 Hypo-osmolality and hyponatremia: Secondary | ICD-10-CM | POA: Diagnosis not present

## 2023-05-13 MED ORDER — LEVETIRACETAM 500 MG PO TABS
500.0000 mg | ORAL_TABLET | Freq: Two times a day (BID) | ORAL | Status: DC
  Administered 2023-05-13 (×2): 500 mg via ORAL
  Filled 2023-05-13 (×2): qty 1

## 2023-05-13 MED ORDER — MIRTAZAPINE 15 MG PO TABS
15.0000 mg | ORAL_TABLET | Freq: Every day | ORAL | Status: DC
  Administered 2023-05-13: 15 mg via ORAL
  Filled 2023-05-13: qty 1

## 2023-05-13 MED ORDER — POLYSACCHARIDE IRON COMPLEX 150 MG PO CAPS
150.0000 mg | ORAL_CAPSULE | Freq: Every day | ORAL | Status: DC
  Filled 2023-05-13: qty 1

## 2023-05-13 MED ORDER — TAMSULOSIN HCL 0.4 MG PO CAPS: 0.4000 mg | ORAL_CAPSULE | Freq: Every day | ORAL | Status: DC

## 2023-05-13 MED ORDER — FOLIC ACID 1 MG PO TABS: 1.0000 mg | ORAL_TABLET | Freq: Every day | ORAL | Status: DC

## 2023-05-13 MED ORDER — ALLOPURINOL 100 MG PO TABS: 100.0000 mg | ORAL_TABLET | Freq: Every day | ORAL | Status: DC

## 2023-05-13 MED ORDER — THIAMINE MONONITRATE 100 MG PO TABS: 100.0000 mg | ORAL_TABLET | Freq: Every day | ORAL | Status: DC

## 2023-05-13 MED ORDER — ADULT MULTIVITAMIN W/MINERALS CH: 1.0000 | ORAL_TABLET | Freq: Every day | ORAL | Status: DC

## 2023-05-13 MED ORDER — MAGIC MOUTHWASH W/LIDOCAINE
5.0000 mL | Freq: Four times a day (QID) | ORAL | Status: DC
  Administered 2023-05-13: 5 mL via ORAL
  Filled 2023-05-13 (×4): qty 5

## 2023-05-13 MED ORDER — FINASTERIDE 5 MG PO TABS: 5.0000 mg | ORAL_TABLET | Freq: Every day | ORAL | Status: DC

## 2023-05-13 MED ORDER — GADOBUTROL 1 MMOL/ML IV SOLN
4.0000 mL | Freq: Once | INTRAVENOUS | Status: AC | PRN
  Administered 2023-05-13: 4 mL via INTRAVENOUS

## 2023-05-13 NOTE — Progress Notes (Signed)
[facility]  - [place] at  [facility]   PATIENT NAME: Cody [name]    MR#:  [id number]  DATE OF BIRTH:  January 14, 1946  SUBJECTIVE:  patient seen earlier. Son at bedside. Son is from [place]. Did not have much information about patient. Patient more awake today. Got video interpreter tried to speak with the patient however found out he is still more confused although awake. Only thing he told us was he was having headache and he is hungry. Son was ordering some food. Per son family was bringing some food from home.  Cannot get much review of systems from patient due to his delirium VITALS:  Blood pressure 115/64, pulse (!) 108, temperature 97.9 F (36.6 C), temperature source Oral, resp. rate (!) 21, SpO2 99%.  PHYSICAL EXAMINATION:  Limited  GENERAL:  77 y.o.-year-old patient with no acute distress. Confused LUNGS: Normal breath sounds bilaterally, no wheezing CARDIOVASCULAR: S1, S2 normal. No murmur   ABDOMEN: Soft, nontender, nondistended.   EXTREMITIES: No  edema b/l.    NEUROLOGIC: moving all extremities. Patient more awake. Not able to have meaningful conversation  LABORATORY PANEL:  CBC Recent Labs  Lab 05/12/23 0503  WBC 7.0  HGB 12.5*  HCT 39.0  PLT 226    Chemistries  Recent Labs  Lab 05/11/23 1622 05/12/23 0503  NA 133* 132*  K 4.3 4.3  CL 100 104  CO2 23 17*  GLUCOSE 91 73  BUN 22 22  CREATININE 1.13 0.83  CALCIUM 7.8* 7.2*  AST 34  --   ALT 25  --   ALKPHOS 78  --   BILITOT 0.8  --    Cardiac Enzymes No results for input(s): "TROPONINI" in the last 168 hours. RADIOLOGY:  CT HEAD WO CONTRAST ( )  Result Date: 05/12/2023 CLINICAL DATA:  77 year old male with altered mental status. Metastatic non-small cell lung cancer. Whole brain radiation treatment was planned for late May/June. EXAM: CT HEAD WITHOUT CONTRAST TECHNIQUE: Contiguous axial images were obtained from the base of the skull through the vertex without intravenous contrast.  RADIATION DOSE REDUCTION: This exam was performed according to the departmental dose-optimization program which includes automated exposure control, adjustment of the mA and/or kV according to patient size and/or use of iterative reconstruction technique. COMPARISON:  Brain MRI 03/05/2023. head CT 03/07/2023. FINDINGS: Brain: Brain volume does not appear significantly changed. Regressed or resolved multifocal bilateral vasogenic edema when compared to May. Superimposed chronic left cerebellar infarct. Residual mild to moderate nonspecific patchy white matter hypodensity. No intracranial mass effect or midline shift. No acute intracranial hemorrhage identified. No cortically based acute infarct identified. Vascular: Calcified atherosclerosis at the skull base. No suspicious intracranial vascular hyperdensity. Skull: Stable.  No acute or suspicious osseous lesion. Sinuses/Orbits: Visualized paranasal sinuses and mastoids are stable and well aerated. Other: No acute orbit or scalp soft tissue finding. IMPRESSION: 1. Noncontrast CT evidence of regressed cerebral metastatic disease since May, possibly following whole brain radiation. 2. No new intracranial abnormality identified. Chronic cerebellar ischemia. Electronically Signed   By: Odessa Fleming M.D.   On: 05/12/2023 12:17   DG Chest Portable 1 View  Result Date: 05/11/2023 CLINICAL DATA:  Lung cancer EXAM: PORTABLE CHEST 1 VIEW COMPARISON:  04/26/2023 FINDINGS: Cardiomegaly. Aortic atherosclerosis. Increased left basilar consolidation with small left pleural effusion. No pneumothorax. IMPRESSION: Increased left basilar consolidation with small left pleural effusion. Electronically Signed   By: Duanne Guess D.O.   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   He was recently diagnosed with shingles. Was on oral antiviral meds.  chest x-ray increased left basilar consolidation with small left pleural effusion.   CT head-Noncontrast CT evidence of regressed cerebral metastatic disease since May, possibly following whole brain radiation. 2. No new intracranial abnormality identified. Chronic cerebellar ischemia.  SIRS due to ?infectious process vs progressive Metastatic Non small cell lung cancer with acute organ dysfunction (HCC), Acute Encephalopathy/delirium Left LL pneumonia /pleural effusion ?recurrent ericardiocentesis --Patient meets SIRS criteria with increased respiration Rate, increased heart rate --Suspected infectious process includes possible bacteremia, pneumonia, urinary tract infection --Organ dysfunction is encephalopathy, altered mental status --- patient on IV fluids. -- Continue empiric antibiotics for now -- Pro calcitonin negative. White count normal. --d/w Dr Cathie Hoops-- recommends repeat MRI brain. Consider pericardiocentesis if family wants to pursue aggressive care. Awaiting palliative care to discussed at length with patient's family.   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 This was discussed at length with patient's two daughters in the ER along  with palliative care nurse practitioner. --await family decision regarding further w/u . 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  >50% time spent on counselling and coordination of care  Note: This dictation was prepared with Dragon dictation along with smaller phrase technology. Any transcriptional errors that result from this process are unintentional.  Enedina Finner M.D    [facility]   CC: Primary care physician; Orson Eva, NP

## 2023-05-13 NOTE — TOC CM/SW Note (Signed)
Transition of Care [facility]) - Inpatient Brief Assessment   Patient Details  Name: Cody [name] MRN: [id number] Date of Birth: 04/11/1946  Transition of [facility]) CM/SW Contact:    Margarito Liner, LCSW Phone Number: 05/13/2023, 12:41 PM   Clinical Narrative: CSW following for palliative plan. Per TOC assistant, "Ahngela with [facility] said they received a referral last week by physician but when they called Friday there was no answer & understand admitted here. They are available to accept pt if still need hospice services at home."  Transition of Care Asessment: Insurance and Status: Insurance coverage has been reviewed Patient has primary care physician: Yes Home environment has been reviewed: Single family home Prior level of function:: Not documented Prior/Current Home Services: No current home services Social Determinants of Health Reivew: SDOH reviewed no interventions necessary Readmission risk has been reviewed: Yes Transition of care needs: transition of care needs identified, TOC will continue to follow

## 2023-05-13 NOTE — Plan of Care (Signed)

## 2023-05-13 NOTE — Progress Notes (Signed)
[facility] at [facility] [phone] [phone]) [phone]   Name: Cody [name] Date: 05/13/2023 MRN: [id number]  DOB: [birth year]  Patient Care Team: Orson Eva, NP as PCP - General (Nurse Practitioner) Glory Buff, RN as Oncology Nurse Navigator    REASON FOR CONSULTATION: Cody [name] is a 77 y.o. 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  Patient was admitted to the hospital 05/11/2023 with altered mental status and failure to thrive.  Patient was referred to palliative care to address goals and manage ongoing symptoms.   SOCIAL HISTORY:     reports that he quit smoking about 2 months ago. His smoking use included cigarettes. He started smoking about 50 years ago. He has a 25 pack-year smoking history. He has been exposed to tobacco smoke. He has never used smokeless tobacco. He reports that he does not currently use alcohol. He reports that he does not use drugs.  Patient is married lives at home with his wife and daughter.  Patient has 11 children with ages ranging from 38s to 34.  Patient worked in [employer], [employer].  Patient is from [place] originally.   ADVANCE DIRECTIVES:  Not on file  CODE STATUS: DNR  PAST MEDICAL HISTORY: Past Medical History:  Diagnosis Date   Alcohol dependence (HCC)    CHF (congestive heart failure) (HCC)    Hypertension    Tobacco abuse     PAST SURGICAL HISTORY:  Past Surgical History:  Procedure Laterality Date   ABDOMINAL SURGERY     APPENDECTOMY     LEFT HEART CATH AND CORONARY ANGIOGRAPHY N/A 08/25/2018   Procedure: LEFT HEART CATH AND CORONARY ANGIOGRAPHY;  Surgeon: Laurier Nancy, MD;  Location: [facility] INVASIVE CV LAB;  Service: Cardiovascular;  Laterality: N/A;   PERICARDIOCENTESIS N/A 03/06/2023   Procedure: PERICARDIOCENTESIS;   Surgeon: Marcina Millard, MD;  Location: [facility] INVASIVE CV LAB;  Service: Cardiovascular;  Laterality: N/A;    HEMATOLOGY/ONCOLOGY HISTORY:  Oncology History  Metastatic non-small cell lung cancer (HCC)  03/05/2023 Imaging   CT chest abdomen pelvis with contrast showed large pericardial effusion. Moderate right and small left effusion with some loculated components on the left. Adjacent parenchymal opacities. Atelectasis versus infiltrates. Few borderline enlarged lymph nodes including mediastinum, upper retroperitoneum. Enlarged prostate with mass effect along the bladder with bladder wall thickening. No bowel obstruction or free air. Small area of wall thickening along the colon.    03/05/2023 Imaging   CT head showed abdominal vasogenic edema in the right posterior frontal region and in the left occipital lobe worrisome for presence of occult mass lesions. Questionable mild edema and mass effect in the inferior right cerebellum. No visible hemorrhage. 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 Atherosclerotic calcification of the major vessels at the base of brain.    03/05/2023 Imaging   MRI brain with and without contrast showed multiple contrast-enhancing lesions in the right frontal lobe, left occipital lobe, superior right cerebellum with surrounding vasogenic edema. Worrisome for intracranial metastatic disease. Small dural based contrast-enhancing lesions along the left frontal convexity could represent a small hemangioma. Metastatic disease is not excluded.    03/05/2023 - 03/12/2023 Hospital Admission   Patient presented emergency room due to confusion, shortness of breath. CT head and MRI showed multiple contrast-enhancing lesions with vasogenic edema CT chest abdomen pelvis showed large pericardial effusion, pleural effusion, mediastinal/upper  retroperitoneum lymphadenopathy Patient was treated with Decadron, neurosurgery recommended no intervention. Status post pericardiocentesis  on 03/07/2023.  Pigtail catheter was initially placed and later on removed.  TTE showed no pericardial effusion. Bilateral pleural effusion, status post thoracentesis.  Cytology positive. 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  Patient was treated with 3% saline clinic sodium improved. Gout with uric acid of 9.7.  Started on colchicine. Alcohol use disorder, status post CIWA protocol with Ativan as needed.   03/07/2023 Cancer Staging   Staging form: Lung, AJCC 8th Edition - Clinical stage from 03/07/2023: Stage IV (cTX, cN2, cM1) - Signed by Rickard Patience, MD on 03/13/2023 Stage prefix: Initial diagnosis   03/07/2023 Echocardiogram   1. Left ventricular ejection fraction, by estimation, is 60 to 65%. The left ventricle has normal function. The left ventricle has no regional  wall motion abnormalities. Indeterminate diastolic filling due to E-A fusion.   2. Right ventricular systolic function is normal. The right ventricular size is normal.   3. Moderate pericardial effusion.   4. The mitral valve is normal in structure. Mild mitral valve regurgitation. No evidence of mitral stenosis.   5. The aortic valve is normal in structure. Aortic valve regurgitation is mild. No aortic stenosis is present.   6. The inferior vena cava is normal in size with greater than 50% respiratory variability, suggesting right atrial pressure of 3 mmHg.     03/07/2023 Procedure   -5/9 S/p pericardiocentesis with removal of 1.1 L of bloody fluid.  Pigtail catheter left in place.  -Cytology came back positive for malignancy.  Metastatic adenocarcinoma, compatible with pulmonary adenocarcinoma   -5/9 s/p  thoracentesis, cytology is negative.    03/08/2023 Initial Diagnosis   Metastatic non-small cell lung cancer (HCC)  [facility] NGS showed RET [id number] missense variant [VAF33.8%] NF1 frame shift, TP53, NFE2L2, CDKN2A, CKKN2B TMB 15.8, MSI stable.   03/11/2023 Echocardiogram   1. Left ventricular  ejection fraction, by estimation, is 60 to 65%. The left ventricle has normal function.   2. Mild to moderate mitral valve regurgitation.   3. Aortic valve regurgitation is mild to moderate    03/19/2023 - 04/03/2023 Radiation Therapy   Whole Brain Radiation.    03/20/2023 - 04/03/2023 Radiation Therapy   Whole Brain Radiation.    03/23/2023 Imaging   PET scan showed 13 mm irregular left lower lobe nodule, likely corresponding to the patient's known primary bronchogenic carcinoma.   Thoracic and upper abdominal nodal metastases, as described above.   Irregular appearance of the posterior gastric antrum, with associated hypermetabolism, as above. Appearance favors gastritis versus gastric ulcer. Consider upper endoscopy for further evaluation.   Prior pericardial effusion and bilateral pleural effusions have resolved.     ALLERGIES:  is allergic to shellfish allergy.  MEDICATIONS:  Current Facility-Administered Medications  Medication Dose Route Frequency Provider Last Rate Last Admin   [START ON 05/14/2023] allopurinol (ZYLOPRIM) tablet 100 mg  100 mg Oral Daily Enedina Finner, MD       cefTRIAXone (ROCEPHIN) 1 g in sodium chloride 0.9 % 100 mL IVPB  1 g Intravenous Q24H Enedina Finner, MD   Stopped at 05/12/23 1745   enoxaparin (LOVENOX) injection 30 mg  30 mg Subcutaneous Q24H Cox, Amy N, Rony   30 mg at 05/12/23 2229   [START ON 05/14/2023] finasteride (PROSCAR) tablet 5 mg  5 mg Oral Daily Enedina Finner, MD       [START ON 05/14/2023] folic acid (FOLVITE) tablet 1 mg  1 mg Oral Daily Enedina Finner, MD       ipratropium-albuterol (DUONEB) 0.5-2.5 (3) MG/3ML nebulizer solution 3 mL  3 mL Nebulization Q6H PRN Cox, Amy N, Brigido       [START ON 05/14/2023] iron polysaccharides (NIFEREX) capsule 150 mg  150 mg Oral Daily Enedina Finner, MD       levETIRAcetam (KEPPRA) tablet 500 mg  500 mg Oral BID Enedina Finner, MD   500 mg at 05/13/23 1341   LORazepam (ATIVAN) injection 0.5 mg  0.5 mg Intravenous Q6H PRN Cox,  Amy N, Lily       metoprolol tartrate (LOPRESSOR) injection 5 mg  5 mg Intravenous Q4H PRN Enedina Finner, MD   5 mg at 05/12/23 1553   mirtazapine (REMERON) tablet 15 mg  15 mg Oral QHS Enedina Finner, MD       [START ON 05/14/2023] multivitamin with minerals tablet 1 tablet  1 tablet Oral Daily Enedina Finner, MD       ondansetron [facility]) tablet 4 mg  4 mg Oral Q6H PRN Cox, Amy N, Osborne       Or   ondansetron (ZOFRAN) injection 4 mg  4 mg Intravenous Q6H PRN Cox, Amy N, Ender       senna-docusate (Senokot-S) tablet 1 tablet  1 tablet Oral QHS PRN Cox, Amy N, Librado       [START ON 05/14/2023] tamsulosin (FLOMAX) capsule 0.4 mg  0.4 mg Oral Daily Enedina Finner, MD       [START ON 05/14/2023] thiamine (VITAMIN B1) tablet 100 mg  100 mg Oral Daily Enedina Finner, MD        VITAL SIGNS: BP 115/64 (BP Location: Right Arm)   Pulse (!) 108   Temp 97.9 F (36.6 C) (Oral)   Resp (!) 21   SpO2 99%  There were no vitals filed for this visit.  Estimated body mass index is 20 kg/m as calculated from the following:   Height as of 04/26/23: 4\' 11"  (1.499 m).   Weight as of 05/03/23: 99 lb (44.9 kg).  LABS: CBC:    Component Value Date/Time   WBC 7.0 05/12/2023 0503   HGB 12.5 (L) 05/12/2023 0503   HGB 12.6 (L) 05/10/2023 1404   HGB 14.6 12/19/2022 0841   HCT 39.0 05/12/2023 0503   HCT 43.8 12/19/2022 0841   PLT 226 05/12/2023 0503   PLT 295 05/10/2023 1404   PLT 292 12/19/2022 0841   MCV 90.9 05/12/2023 0503   MCV 99 (H) 12/19/2022 0841   NEUTROABS 4.1 05/11/2023 1622   NEUTROABS 3.9 12/19/2022 0841   LYMPHSABS 0.7 05/11/2023 1622   LYMPHSABS 1.7 12/19/2022 0841   MONOABS 0.2 05/11/2023 1622   EOSABS 0.8 (H) 05/11/2023 1622   EOSABS 1.3 (H) 12/19/2022 0841   BASOSABS 0.0 05/11/2023 1622   BASOSABS 0.0 12/19/2022 0841   Comprehensive Metabolic Panel:    Component Value Date/Time   NA 132 (L) 05/12/2023 0503   NA 133 (L) 04/08/2023 1303   K 4.3 05/12/2023 0503   CL 104 05/12/2023 0503   CO2 17 (L)  05/12/2023 0503   BUN 22 05/12/2023 0503   BUN 21 04/08/2023 1303   CREATININE 0.83 05/12/2023 0503   CREATININE 0.99 05/10/2023 1404   GLUCOSE 73 05/12/2023 0503   CALCIUM 7.2 (L) 05/12/2023 0503   AST 34 05/11/2023 1622   AST 29 05/10/2023 1404   ALT 25 05/11/2023 1622   ALT 25 05/10/2023 1404   ALKPHOS 78 05/11/2023  1622   BILITOT 0.8 05/11/2023 1622   BILITOT 0.3 05/10/2023 1404   PROT 6.9 05/11/2023 1622   PROT 6.4 04/08/2023 1303   ALBUMIN 2.2 (L) 05/11/2023 1622   ALBUMIN 3.3 (L) 04/08/2023 1303    RADIOGRAPHIC STUDIES: CT HEAD WO CONTRAST ( )  Result Date: 05/12/2023 CLINICAL DATA:  77 year old male with altered mental status. Metastatic non-small cell lung cancer. Whole brain radiation treatment was planned for late May/June. EXAM: CT HEAD WITHOUT CONTRAST TECHNIQUE: Contiguous axial images were obtained from the base of the skull through the vertex without intravenous contrast. RADIATION DOSE REDUCTION: This exam was performed according to the departmental dose-optimization program which includes automated exposure control, adjustment of the mA and/or kV according to patient size and/or use of iterative reconstruction technique. COMPARISON:  Brain MRI 03/05/2023. head CT 03/07/2023. FINDINGS: Brain: Brain volume does not appear significantly changed. Regressed or resolved multifocal bilateral vasogenic edema when compared to May. Superimposed chronic left cerebellar infarct. Residual mild to moderate nonspecific patchy white matter hypodensity. No intracranial mass effect or midline shift. No acute intracranial hemorrhage identified. No cortically based acute infarct identified. Vascular: Calcified atherosclerosis at the skull base. No suspicious intracranial vascular hyperdensity. Skull: Stable.  No acute or suspicious osseous lesion. Sinuses/Orbits: Visualized paranasal sinuses and mastoids are stable and well aerated. Other: No acute orbit or scalp soft tissue finding.  IMPRESSION: 1. Noncontrast CT evidence of regressed cerebral metastatic disease since May, possibly following whole brain radiation. 2. No new intracranial abnormality identified. Chronic cerebellar ischemia. Electronically Signed   By: Odessa Fleming M.D.   On: 05/12/2023 12:17   DG Chest Portable 1 View  Result Date: 05/11/2023 CLINICAL DATA:  Lung cancer EXAM: PORTABLE CHEST 1 VIEW COMPARISON:  04/26/2023 FINDINGS: Cardiomegaly. Aortic atherosclerosis. Increased left basilar consolidation with small left pleural effusion. No pneumothorax. IMPRESSION: Increased left basilar consolidation with small left pleural effusion. Electronically Signed   By: Duanne Guess D.O.   On: 05/11/2023 15:41   CT Angio Chest PE W and/or Wo Contrast  Result Date: 04/26/2023 CLINICAL DATA:  Chest and back pain since yesterday, short of breath, metastatic non-small cell lung cancer EXAM: CT ANGIOGRAPHY CHEST WITH CONTRAST TECHNIQUE: Multidetector CT imaging of the chest was performed using the standard protocol during bolus administration of intravenous contrast. Multiplanar CT image reconstructions and MIPs were obtained to evaluate the vascular anatomy. RADIATION DOSE REDUCTION: This exam was performed according to the departmental dose-optimization program which includes automated exposure control, adjustment of the mA and/or kV according to patient size and/or use of iterative reconstruction technique. CONTRAST:  75mL OMNIPAQUE IOHEXOL 350 MG/ML SOLN COMPARISON:  03/19/2023, 04/26/2023, 03/05/2023 FINDINGS: Cardiovascular: This is a technically adequate evaluation of the pulmonary vasculature. No filling defects or pulmonary emboli. There is a persistent but decreased pericardial effusion, measuring up to 1.5 cm in thickness where previously it had measured up to 4.2 cm in thickness on CT 03/05/2023. The pericardial effusion had previously resolved on PET scan 03/19/2023. Normal caliber of the thoracic aorta. Stable  atherosclerosis of the aorta and coronary vasculature. Mediastinum/Nodes: Mediastinal adenopathy again noted, measuring 8 mm in the pretracheal region and 13 mm in the subcarinal region. Thyroid, trachea, and esophagus are stable. Lungs/Pleura: Stable emphysema. The pleural effusions seen on prior CT have resolved in the interim. 13 mm spiculated left lower lobe pulmonary nodule image 97/6, corresponds to the metabolically active nodule on prior exam. Subpleural wedge-shaped consolidation medial aspect left lower lobe measuring 3 x 2 cm unchanged, which  was not metabolically active on prior PET scan, and may reflect atelectasis or scarring. No acute airspace disease. No pneumothorax. Central airways are patent, with retained secretions in the trachea and left mainstem bronchus. Upper Abdomen: Upper abdominal adenopathy again visualized, compatible with the known metastatic adenopathy seen on prior PET scan. No other acute findings. Musculoskeletal: No acute or destructive bony abnormalities. Reconstructed images demonstrate no additional findings. Review of the MIP images confirms the above findings. IMPRESSION: 1. No evidence of pulmonary embolus. 2. Stable irregular spiculated left lower lobe pulmonary nodule, consistent with a metabolically active nodule on prior PET scan and compatible with malignancy. 3. Persistent mediastinal and upper abdominal adenopathy, consistent with nodal metastases on prior PET scan. 4. Moderate pericardial effusion has recurred since the PET scan 03/19/2023. 5. Persistent wedge-shaped consolidation within the medial left lower lobe, likely scarring or atelectasis given lack of metabolic activity on prior PET scan. 6. Aortic Atherosclerosis (ICD10-I70.0) and Emphysema (ICD10-J43.9). Electronically Signed   By: Sharlet Salina M.D.   On: 04/26/2023 18:50   DG Chest 2 View  Result Date: 04/26/2023 CLINICAL DATA:  CP EXAM: CHEST - 2 VIEW COMPARISON:  03/07/2023 FINDINGS: Improved left  lower lung infiltrate with only a small left infrahilar residual opacity. Right lung clear. No pneumothorax. Heart size and mediastinal contours are within normal limits. Aortic Atherosclerosis (ICD10-170.0). No effusion. Visualized bones unremarkable. IMPRESSION: Improved left lower lung infiltrate. Electronically Signed   By: Corlis Leak M.D.   On: 04/26/2023 15:32    PERFORMANCE STATUS (ECOG) : 3 - Symptomatic, >50% confined to bed  Review of Systems Unable to complete  Physical Exam General: Thin, frail appearing Pulmonary: Unlabored Extremities: no edema, no joint deformities Skin: no rashes Neurological: Weakness but otherwise nonfocal  IMPRESSION: Patient seen by palliative care over the weekend.  Follow-up visit today.  Using a language interpreter, I spoke with patient's wife who was at bedside.  She says that she is aware the patient has advanced cancer with poor prognosis.  Family have talked some about whether to bring him home with hospice.  However, wife asked that I call and speak with patient's daughter, Irving Burton, to make any decisions regarding patient's care.  Of note, patient was sleeping but would spontaneously wake and attempt to speak to me in [place]  Using interpreter, I attempted to communicate with patient but noted him to be confused.  He thought he was staying at a hotel and lacks significant insight into his current clinical situation.  I tried unsuccessful to call patient's daughter.  Voicemail left.  PLAN: -Will try to reach daughter to clarify goals  Case discussed with Dr. Cathie Hoops   Time Total: 30 minutes  Visit consisted of counseling and education dealing with the complex and emotionally intense issues of symptom management and palliative care in the setting of serious and potentially life-threatening illness.Greater than 50%  of this time was spent counseling and coordinating care related to the above assessment and plan.  Signed by: Laurette Schimke,  PhD, NP-C

## 2023-05-13 NOTE — Consult Note (Signed)
Hematology/Oncology Consult note [phone] [phone]      Patient Care Team: Orson Eva, NP as PCP - General (Nurse Practitioner) Glory Buff, RN as Oncology Nurse Navigator   Name of the patient: Cody [name]  [id number]  [birth year]   REASON FOR COSULTATION:  Lung cancer History of presenting illness-  77 y.o. male with PMH listed at below who presents to ER due to change of mental status decrease oral intake, weakness. 05/12/2023, CT head without contrast showed evidence of progressive cerebral metastatic disease since May.  No new intracranial abnormality identified.  Chronic cerebellar ischemia. Patient was admitted for AMS, failure to thrive, received broad-spectrum antibiotics for SIRS Patient is more awake still intermittently confused today.  he is known to oncology service due to metastatic adenocarcinoma with brain metastasis, pleural effusion, pericardial effusio. status post palliative brain radiation.  He has a RET mutation, has been taking targeted therapy Retevmo 120 mg twice daily.  Family has noticed him having decreased oral intake, reporting pain with swallowing for about 10 days.  He has also been recently treated for shingles.  Palliative care service and oncology were consulted for further evaluation and management.    Allergies  Allergen Reactions   Shellfish Allergy Hives    Only allergic to crabs    Patient Active Problem List   Diagnosis Date Noted   Metastatic non-small cell lung cancer (HCC) 03/08/2023    Priority: High   Shingles 05/03/2023    Priority: Medium    Encounter for antineoplastic chemotherapy 04/11/2023    Priority: Medium    Hyponatremia 03/05/2023    Priority: Medium    Pericardial effusion 03/05/2023    Priority: Medium    Brain mass 03/05/2023    Priority: Medium    Elevated LFTs 03/05/2023    Priority: Medium    Goals of care, counseling/discussion 03/08/2023    Priority: Low   Nicotine  dependence with nicotine-induced disorder 12/24/2022    Priority: Low   Altered mental status 05/11/2023   SIRS due to infectious process with acute organ dysfunction (HCC) 05/11/2023   Failure to thrive in adult 05/11/2023   Protein-calorie malnutrition, severe (HCC) 05/11/2023   Drug-induced constipation 04/16/2023   Edema of right foot 03/26/2023   Atrial fibrillation, currently in sinus rhythm 03/19/2023   Metastasis to brain (HCC) 03/06/2023   Acute encephalopathy 03/05/2023   Vasogenic edema (HCC) 03/05/2023   Pleural effusion, bilateral 03/05/2023   Alcohol use disorder 03/05/2023   BPH (benign prostatic hyperplasia) 03/05/2023   Pneumonia of left lower lobe due to infectious organism 03/05/2023   Congestive heart failure (HCC) 12/24/2022   Chronic obstructive pulmonary disease (HCC) 12/24/2022   Mixed hyperlipidemia 12/24/2022   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   Unstable angina (HCC) 08/13/2018   Syncope 07/27/2018     Past Medical History:  Diagnosis Date   Alcohol dependence (HCC)    CHF (congestive heart failure) (HCC)    Hypertension    Tobacco abuse      Past Surgical History:  Procedure Laterality Date   ABDOMINAL SURGERY     APPENDECTOMY     LEFT HEART CATH AND CORONARY ANGIOGRAPHY N/A 08/25/2018   Procedure: LEFT HEART CATH AND CORONARY ANGIOGRAPHY;  Surgeon: Laurier Nancy, MD;  Location: [facility];  Service: Cardiovascular;  Laterality: N/A;   PERICARDIOCENTESIS N/A 03/06/2023   Procedure: PERICARDIOCENTESIS;  Surgeon: Marcina Millard, MD;  Location: [facility];  Service: Cardiovascular;  Laterality: N/A;  Social History   Socioeconomic History   Marital status: Married    Spouse name: Not on file   Number of children: Not on file   Years of education: Not on file   Highest education level: Not on file  Occupational History   Not on file  Tobacco Use   Smoking status: Former    Current packs/day: 0.00     Average packs/day: 0.5 packs/day for 50.0 years (25.0 ttl pk-yrs)    Types: Cigarettes    Start date: 03/07/1973    Quit date: 03/08/2023    Years since quitting: 0.1    Passive exposure: Current   Smokeless tobacco: Never  Vaping Use   Vaping status: Never Used  Substance and Sexual Activity   Alcohol use: Not Currently    Comment: stopping drinking approx 03/03/23   Drug use: Never   Sexual activity: Not Currently    Birth control/protection: None  Other Topics Concern   Not on file  Social History Narrative   Not on file   Social Determinants of Health   Financial Resource Strain: Low Risk  (08/25/2018)   Overall Financial Resource Strain (CARDIA)    Difficulty of Paying Living Expenses: Not very hard  Food Insecurity: Not on file  Transportation Needs: Unknown (08/25/2018)   PRAPARE - Transportation    Lack of Transportation (Medical): No    Lack of Transportation (Non-Medical): Not on file  Physical Activity: Not on file  Stress: No Stress Concern Present (08/25/2018)   [employer] of Occupational Health - Occupational Stress Questionnaire    Feeling of Stress : Only a little  Social Connections: Not on file  Intimate Partner Violence: Not on file     History reviewed. No pertinent family history.   Current Facility-Administered Medications:    [START ON 05/14/2023] allopurinol (ZYLOPRIM) tablet 100 mg, 100 mg, Oral, Daily, Enedina Finner, MD   cefTRIAXone (ROCEPHIN) 1 g in sodium chloride 0.9 % 100 mL IVPB, 1 g, Intravenous, Q24H, Enedina Finner, MD, Stopped at 05/12/23 1745   enoxaparin (LOVENOX) injection 30 mg, 30 mg, Subcutaneous, Q24H, Cox, Amy N, Magdaleno, 30 mg at 05/12/23 2229   [START ON 05/14/2023] finasteride (PROSCAR) tablet 5 mg, 5 mg, Oral, Daily, Enedina Finner, MD   Melene Muller ON 05/14/2023] folic acid (FOLVITE) tablet 1 mg, 1 mg, Oral, Daily, Enedina Finner, MD   ipratropium-albuterol (DUONEB) 0.5-2.5 (3) MG/3ML nebulizer solution 3 mL, 3 mL, Nebulization, Q6H PRN, Cox,  Amy N, Damante   [START ON 05/14/2023] iron polysaccharides (NIFEREX) capsule 150 mg, 150 mg, Oral, Daily, Enedina Finner, MD   levETIRAcetam (KEPPRA) tablet 500 mg, 500 mg, Oral, BID, Enedina Finner, MD   LORazepam (ATIVAN) injection 0.5 mg, 0.5 mg, Intravenous, Q6H PRN, Cox, Amy N, Tam   metoprolol tartrate (LOPRESSOR) injection 5 mg, 5 mg, Intravenous, Q4H PRN, Enedina Finner, MD, 5 mg at 05/12/23 1553   mirtazapine (REMERON) tablet 15 mg, 15 mg, Oral, QHS, Enedina Finner, MD   [START ON 05/14/2023] multivitamin with minerals tablet 1 tablet, 1 tablet, Oral, Daily, Enedina Finner, MD   ondansetron (ZOFRAN) tablet 4 mg, 4 mg, Oral, Q6H PRN **OR** ondansetron (ZOFRAN) injection 4 mg, 4 mg, Intravenous, Q6H PRN, Cox, Amy N, [place]   senna-docusate (Senokot-S) tablet 1 tablet, 1 tablet, Oral, QHS PRN, Cox, Amy N, [place]   [START ON 05/14/2023] tamsulosin (FLOMAX) capsule 0.4 mg, 0.4 mg, Oral, Daily, Enedina Finner, MD   [START ON 05/14/2023] thiamine (VITAMIN B1) tablet 100 mg, 100 mg, Oral,  Daily, Enedina Finner, MD  Review of Systems  Unable to perform ROS: Mental status change    PHYSICAL EXAM Vitals:   05/12/23 2051 05/13/23 0400 05/13/23 0736 05/13/23 1142  BP: 97/64 98/61 110/62 115/64  Pulse: (!) 101 (!) 108 (!) 107 (!) 108  Resp: 20 20 (!) 21 (!) 21  Temp: 98.2 F (36.8 C) 98.8 F (37.1 C) 97.8 F (36.6 C) 97.9 F (36.6 C)  TempSrc:  Axillary Axillary Oral  SpO2: 100% 99% 99% 99%   Physical Exam Constitutional:      General: He is not in acute distress. HENT:     Head: Normocephalic and atraumatic.     Mouth/Throat:     Comments: Severe mucositis Eyes:     General: No scleral icterus. Cardiovascular:     Rate and Rhythm: Normal rate and regular rhythm.  Pulmonary:     Effort: Pulmonary effort is normal. No respiratory distress.     Comments: Decreased breath sound bilaterally Abdominal:     General: There is no distension.     Palpations: Abdomen is soft.  Musculoskeletal:        General: Normal  range of motion.     Cervical back: Normal range of motion and neck supple.  Skin:    General: Skin is warm and dry.     Findings: No erythema.  Neurological:     Mental Status: He is alert.     Cranial Nerves: No cranial nerve deficit.     Motor: No abnormal muscle tone.     Comments: Confused  Psychiatric:        Mood and Affect: Affect normal.       LABORATORY STUDIES    Latest Ref Rng & Units 05/12/2023    5:03 AM 05/11/2023    4:22 PM 05/10/2023    2:04 PM  CBC  WBC 4.0 - 10.5 K/uL 7.0  5.9  6.3   Hemoglobin 13.0 - 17.0 g/dL 40.9  81.1  91.4   Hematocrit 39.0 - 52.0 % 39.0  40.3  38.7   Platelets 150 - 400 K/uL 226  272  295       Latest Ref Rng & Units 05/12/2023    5:03 AM 05/11/2023    4:22 PM 05/10/2023    2:04 PM  CMP  Glucose 70 - 99 mg/dL 73  91  782   BUN 8 - 23 mg/dL 22  22  17    Creatinine 0.61 - 1.24 mg/dL 9.56  2.13  0.86   Sodium 135 - 145 mmol/L 132  133  130   Potassium 3.5 - 5.1 mmol/L 4.3  4.3  4.3   Chloride 98 - 111 mmol/L 104  100  98   CO2 22 - 32 mmol/L 17  23  22    Calcium 8.9 - 10.3 mg/dL 7.2  7.8  8.6   Total Protein 6.5 - 8.1 g/dL  6.9  6.9   Total Bilirubin 0.3 - 1.2 mg/dL  0.8  0.3   Alkaline Phos 38 - 126 U/L  78  79   AST 15 - 41 U/L  34  29   ALT 0 - 44 U/L  25  25      RADIOGRAPHIC STUDIES: I have personally reviewed the radiological images as listed and agreed with the findings in the report. CT HEAD WO CONTRAST ( )  Result Date: 05/12/2023 CLINICAL DATA:  77 year old male with altered mental status. Metastatic non-small cell lung cancer. Whole brain radiation  treatment was planned for late May/June. EXAM: CT HEAD WITHOUT CONTRAST TECHNIQUE: Contiguous axial images were obtained from the base of the skull through the vertex without intravenous contrast. RADIATION DOSE REDUCTION: This exam was performed according to the departmental dose-optimization program which includes automated exposure control, adjustment of the mA and/or kV  according to patient size and/or use of iterative reconstruction technique. COMPARISON:  Brain MRI 03/05/2023. head CT 03/07/2023. FINDINGS: Brain: Brain volume does not appear significantly changed. Regressed or resolved multifocal bilateral vasogenic edema when compared to May. Superimposed chronic left cerebellar infarct. Residual mild to moderate nonspecific patchy white matter hypodensity. No intracranial mass effect or midline shift. No acute intracranial hemorrhage identified. No cortically based acute infarct identified. Vascular: Calcified atherosclerosis at the skull base. No suspicious intracranial vascular hyperdensity. Skull: Stable.  No acute or suspicious osseous lesion. Sinuses/Orbits: Visualized paranasal sinuses and mastoids are stable and well aerated. Other: No acute orbit or scalp soft tissue finding. IMPRESSION: 1. Noncontrast CT evidence of regressed cerebral metastatic disease since May, possibly following whole brain radiation. 2. No new intracranial abnormality identified. Chronic cerebellar ischemia. Electronically Signed   By: Odessa Fleming M.D.   On: 05/12/2023 12:17   DG Chest Portable 1 View  Result Date: 05/11/2023 CLINICAL DATA:  Lung cancer EXAM: PORTABLE CHEST 1 VIEW COMPARISON:  04/26/2023 FINDINGS: Cardiomegaly. Aortic atherosclerosis. Increased left basilar consolidation with small left pleural effusion. No pneumothorax. IMPRESSION: Increased left basilar consolidation with small left pleural effusion. Electronically Signed   By: Duanne Guess D.O.   On: 05/11/2023 15:41   PCV ECHOCARDIOGRAM COMPLETE  Addendum Date: 05/09/2023   Reason for Visit  INDICATIONS:   I31.39 Echocardiogram: An echocardiogram in (2-d) mode was performed and in Doppler mode with color flow velocity mapping was performed. ventricular septum thickness 0.768 cm, L ventricular posterior wall thickness (diastole) 0.908 cm, left atrium size 3.6 cm, aortic root diameter 3.4 cm, L ventricle diastolic  dimension 4.24 cm, L ventricle systolic dimension 2.55, L ventricle ejection fraction 70.9 %, and LV fractional shortening 39.9 % L ventricular outflow tract internal diameter 3.1 cm, L ventricular outflow tract flow velocity 1.0 m/s, aortic valve cusps 1.9 cm , aortic valve flow velocity .924 (m/sec), aortic valve systolic calculated mean flow gradient 2 mmHg, mitral valve diastolic peak flow velocity E .622 m/sec, and mitral valve diastolic peak flow E/A ratio 0.6 Mitral valve has mild regurgitation Aortic valve has  moderate regurgitation Tricuspid valve has trace regurgitation ASSESSMENT Technically adequate study.. Normal chamber sizes. Normal left ventricular systolic function. Mild left ventricular hypertrophy with GRADE 1  (relaxation abnormality) diastolic dysfunction. Normal right ventricular systolic function. Normal right ventricular diastolic function. Normal left ventricular wall motion. Normal right ventricular wall motion. Trace tricuspid regurgitation. Mild pulmonary hypertension. Mild mitral regurgitation. Mild to moderate aortic regurgitation. No pericardial effusion. Mild Left ventricle hypertrophy   Result Date: 05/09/2023 Images from the original result were not included. Reason for Visit  INDICATIONS:   I31.39 Echocardiogram: An echocardiogram in (2-d) mode was performed and in Doppler mode with color flow velocity mapping was performed. ventricular septum thickness 0.768 cm, L ventricular posterior wall thickness (diastole) 0.908 cm, left atrium size 3.6 cm, aortic root diameter 3.4 cm, L ventricle diastolic dimension 4.24 cm, L ventricle systolic dimension 2.55, L ventricle ejection fraction 70.9 %, and LV fractional shortening 39.9 % L ventricular outflow tract internal diameter 3.1 cm, L ventricular outflow tract flow velocity 1.0 m/s, aortic valve cusps 1.9 cm , aortic valve flow  velocity .924 (m/sec), aortic valve systolic calculated mean flow gradient 2 mmHg, mitral valve diastolic  peak flow velocity E .622 m/sec, and mitral valve diastolic peak flow E/A ratio 0.6 Mitral valve has mild regurgitation Aortic valve has  moderate regurgitation Tricuspid valve has trace regurgitation ASSESSMENT Technically adequate study.. Normal chamber sizes. Normal left ventricular systolic function. Mild left ventricular hypertrophy with GRADE 1  (relaxation abnormality) diastolic dysfunction. Normal right ventricular systolic function. Normal right ventricular diastolic function. Normal left ventricular wall motion. Normal right ventricular wall motion. Trace tricuspid regurgitation. Mild pulmonary hypertension. Mild mitral regurgitation. Mild to moderate aortic regurgitation. No pericardial effusion. Mild Left ventricle hypertrophy   CT Angio Chest PE W and/or Wo Contrast  Result Date: 04/26/2023 CLINICAL DATA:  Chest and back pain since yesterday, short of breath, metastatic non-small cell lung cancer EXAM: CT ANGIOGRAPHY CHEST WITH CONTRAST TECHNIQUE: Multidetector CT imaging of the chest was performed using the standard protocol during bolus administration of intravenous contrast. Multiplanar CT image reconstructions and MIPs were obtained to evaluate the vascular anatomy. RADIATION DOSE REDUCTION: This exam was performed according to the departmental dose-optimization program which includes automated exposure control, adjustment of the mA and/or kV according to patient size and/or use of iterative reconstruction technique. CONTRAST:  75mL OMNIPAQUE IOHEXOL 350 MG/ML SOLN COMPARISON:  03/19/2023, 04/26/2023, 03/05/2023 FINDINGS: Cardiovascular: This is a technically adequate evaluation of the pulmonary vasculature. No filling defects or pulmonary emboli. There is a persistent but decreased pericardial effusion, measuring up to 1.5 cm in thickness where previously it had measured up to 4.2 cm in thickness on CT 03/05/2023. The pericardial effusion had previously resolved on PET scan 03/19/2023. Normal  caliber of the thoracic aorta. Stable atherosclerosis of the aorta and coronary vasculature. Mediastinum/Nodes: Mediastinal adenopathy again noted, measuring 8 mm in the pretracheal region and 13 mm in the subcarinal region. Thyroid, trachea, and esophagus are stable. Lungs/Pleura: Stable emphysema. The pleural effusions seen on prior CT have resolved in the interim. 13 mm spiculated left lower lobe pulmonary nodule image 97/6, corresponds to the metabolically active nodule on prior exam. Subpleural wedge-shaped consolidation medial aspect left lower lobe measuring 3 x 2 cm unchanged, which was not metabolically active on prior PET scan, and may reflect atelectasis or scarring. No acute airspace disease. No pneumothorax. Central airways are patent, with retained secretions in the trachea and left mainstem bronchus. Upper Abdomen: Upper abdominal adenopathy again visualized, compatible with the known metastatic adenopathy seen on prior PET scan. No other acute findings. Musculoskeletal: No acute or destructive bony abnormalities. Reconstructed images demonstrate no additional findings. Review of the MIP images confirms the above findings. IMPRESSION: 1. No evidence of pulmonary embolus. 2. Stable irregular spiculated left lower lobe pulmonary nodule, consistent with a metabolically active nodule on prior PET scan and compatible with malignancy. 3. Persistent mediastinal and upper abdominal adenopathy, consistent with nodal metastases on prior PET scan. 4. Moderate pericardial effusion has recurred since the PET scan 03/19/2023. 5. Persistent wedge-shaped consolidation within the medial left lower lobe, likely scarring or atelectasis given lack of metabolic activity on prior PET scan. 6. Aortic Atherosclerosis (ICD10-I70.0) and Emphysema (ICD10-J43.9). Electronically Signed   By: Sharlet Salina M.D.   On: 04/26/2023 18:50   DG Chest 2 View  Result Date: 04/26/2023 CLINICAL DATA:  CP EXAM: CHEST - 2 VIEW  COMPARISON:  03/07/2023 FINDINGS: Improved left lower lung infiltrate with only a small left infrahilar residual opacity. Right lung clear. No pneumothorax. Heart size and mediastinal contours are  within normal limits. Aortic Atherosclerosis (ICD10-170.0). No effusion. Visualized bones unremarkable. IMPRESSION: Improved left lower lung infiltrate. Electronically Signed   By: Corlis Leak M.D.   On: 04/26/2023 15:32   NM PET Image Initial (PI) Skull Base To Thigh  Result Date: 03/23/2023 CLINICAL DATA:  Initial treatment strategy for metastatic non-small cell lung cancer. EXAM: NUCLEAR MEDICINE PET SKULL BASE TO THIGH TECHNIQUE: 5.4 mCi F-18 FDG was injected intravenously. Full-ring PET imaging was performed from the skull base to thigh after the radiotracer. CT data was obtained and used for attenuation correction and anatomic localization. Fasting blood glucose: 85 mg/dl COMPARISON:  CT chest abdomen pelvis dated 03/05/2023 FINDINGS: Mediastinal blood pool activity: SUV max 1.8 Liver activity: SUV max NA NECK: No hypermetabolic cervical lymphadenopathy. Incidental CT findings: None. CHEST: 13 mm irregular left lower lobe nodule (series 3/image 87), max SUV 3.2, suspicious. Thoracic lymphadenopathy, including: --5 mm short axis right supraclavicular node (series 3/image 38), max SUV 5.8 --8 mm short axis high right paratracheal node (series 3/image 46), max SUV 8.3 --12 mm short axis subcarinal node (series 3/image 86), max SUV 9.9 --Left perihilar hypermetabolism, max SUV 5.2 Incidental CT findings: Prior pericardial effusion has resolved. Atherosclerotic calcifications of the arch. Moderate three-vessel coronary atherosclerosis. Patchy left lower lobe atelectasis. Prior pleural effusions have resolved. ABDOMEN/PELVIS: No abnormal hypermetabolism in the liver, spleen, pancreas, or adrenal glands. Irregular appearance of the posterior gastric antrum (series 3/image 83), non masslike but with associated mild  hypermetabolism in this region (max SUV 5.7). Upper abdominal and retroperitoneal lymphadenopathy, including: --10 mm short axis peripancreatic node (series 3/image 81), max SUV 7.1 --12 mm short axis celiac axis node (series 3/image 87), max SUV 7.1 --12 mm short axis right para-aortic node (series 3/image 90), max SUV 6.8 --12 mm short axis left para-aortic node (series 3/image 92), max SUV 7.0 Incidental CT findings: Atherosclerotic calcifications the abdominal aorta and branch vessels. Moderate colonic stool burden, suggesting mild constipation. Prostatomegaly, suggesting BPH, with thick-walled bladder, suggesting chronic bladder outlet obstruction. SKELETON: No focal hypermetabolic activity to suggest skeletal metastasis. Incidental CT findings: Degenerative changes of the visualized thoracolumbar spine. IMPRESSION: 13 mm irregular left lower lobe nodule, likely corresponding to the patient's known primary bronchogenic carcinoma. Thoracic and upper abdominal nodal metastases, as described above. Irregular appearance of the posterior gastric antrum, with associated hypermetabolism, as above. Appearance favors gastritis versus gastric ulcer. Consider upper endoscopy for further evaluation. Prior pericardial effusion and bilateral pleural effusions have resolved. Electronically Signed   By: Charline Bills M.D.   On: 03/23/2023 01:35   ECHOCARDIOGRAM LIMITED  Result Date: 03/11/2023    ECHOCARDIOGRAM LIMITED REPORT   Patient Name:   Jeromiah Springfield    Date of Exam: 03/11/2023 Medical Rec #:  [id number]  Height:       60.0 in Accession #:    [id number] Weight:       107.8 lb Date of Birth:  1946-10-20 BSA:          1.435 m Patient Age:    76 years   BP:           124/56 mmHg Patient Gender: M          HR:           77 bpm. Exam Location:  [facility] Procedure: Limited Echo, Cardiac Doppler and Color Doppler Indications:     Pericardial Effusion I31.3  History:         Patient has prior history of Echocardiogram  examinations,  most                  recent 03/07/2023. CHF; Risk Factors:Hypertension. Alcohol                  dependence, tobacco abuse.  Sonographer:     Cristela Blue Referring Phys:  [id number] Lyn Hollingshead PARASCHOS Diagnosing Phys: Marcina Millard MD IMPRESSIONS  1. Left ventricular ejection fraction, by estimation, is 60 to 65%. The left ventricle has normal function.  2. Mild to moderate mitral valve regurgitation.  3. Aortic valve regurgitation is mild to moderate. FINDINGS  Left Ventricle: Left ventricular ejection fraction, by estimation, is 60 to 65%. The left ventricle has normal function. Pericardium: There is no evidence of pericardial effusion. Mitral Valve: Mild to moderate mitral valve regurgitation. Tricuspid Valve: Tricuspid valve regurgitation is mild. Aortic Valve: Aortic valve regurgitation is mild to moderate. Aortic regurgitation PHT measures 343 msec. Additional Comments: Spectral Doppler performed. Color Doppler performed.  LEFT VENTRICLE PLAX 2D LVIDd:         5.10 cm   Diastology LVIDs:         3.30 cm   LV e' medial: 5.55 cm/s LV PW:         0.80 cm LV IVS:        0.80 cm LVOT diam:     2.10 cm LVOT Area:     3.46 cm  RIGHT VENTRICLE RV Basal diam:  2.80 cm RV Mid diam:    2.60 cm TAPSE (M-mode): 1.2 cm LEFT ATRIUM             Index        RIGHT ATRIUM           Index LA diam:        3.10 cm 2.16 cm/m   RA Area:     15.90 cm LA Vol (A2C):   15.7 ml 10.94 ml/m  RA Volume:   40.70 ml  28.35 ml/m LA Vol (A4C):   18.9 ml 13.17 ml/m LA Biplane Vol: 17.5 ml 12.19 ml/m  AORTIC VALVE AI PHT:      343 msec  AORTA Ao Root diam: 3.00 cm TRICUSPID VALVE TR Peak grad:   23.4 mmHg TR Vmax:        242.00 cm/s  SHUNTS Systemic Diam: 2.10 cm Marcina Millard MD Electronically signed by Marcina Millard MD Signature Date/Time: 03/11/2023/12:51:41 PM    Final    EEG adult  Result Date: 03/07/2023 Jefferson Fuel, MD     03/08/2023  3:10 PM Routine EEG Report Advay Nunziata is a 76 y.o. male with a  history of altered mental status who is undergoing an EEG to evaluate for seizures. Report: This EEG was acquired with electrodes placed according to the International 10-20 electrode system (including Fp1, Fp2, F3, F4, C3, C4, P3, P4, O1, O2, T3, T4, T5, T6, A1, A2, Fz, Cz, Pz). The following electrodes were missing or displaced: none. The occipital dominant rhythm was 4-6 Hz. This activity is reactive to stimulation. There are runs of faster activity most prominent over the left frontal region that are favored to be arousals. Drowsiness was manifested by background fragmentation; deeper stages of sleep were identified by K complexes and sleep spindles. There iws focal slowing over the left frontal region. There were occasional L occipital sharp waves. There were no electrographic seizures identified. Photic stimulation and hyperventilation were not performed. Impression and clinical correlation: This EEG was obtained while awake and asleep and  is abnormal due to: - moderate diffuse slowing indicative of global cerebral dysfunction - left focal slowing indicative of superimposed focal cerebral dysfunction in that region - sharp waves in the left occipital region indicating increased epileptogenicity in that region No electrographic seizures were captured during this recording. Bing Neighbors, MD [facility] [phone] If 7pm- 7am, please page neurology on call as listed in AMION.   DG Chest Port 1 View  Result Date: 03/07/2023 CLINICAL DATA:  Status post right thoracentesis EXAM: PORTABLE CHEST 1 VIEW COMPARISON:  Chest radiograph dated 03/07/2023 FINDINGS: Lines/tubes: Partially imaged inferior approach catheter projects over the left upper quadrant. Chest: Improved lung aeration with persistent left-greater-than-right interstitial and patchy opacities. Pleura: Decreased right pleural effusion. Persistent moderate left pleural effusion. No pneumothorax. Heart/mediastinum: Similar enlarged  cardiomediastinal silhouette. Bones: No acute osseous abnormality. IMPRESSION: 1. Decreased right pleural effusion status post thoracentesis. No pneumothorax. 2. Persistent moderate left pleural effusion. 3. Improved lung aeration with persistent left-greater-than-right interstitial and patchy opacities. Electronically Signed   By: Agustin Cree M.D.   On: 03/07/2023 16:35   ECHOCARDIOGRAM LIMITED  Result Date: 03/07/2023    ECHOCARDIOGRAM LIMITED REPORT   Patient Name:   Rahiem Eshelman    Date of Exam: 03/07/2023 Medical Rec #:  [id number]  Height:       60.0 in Accession #:    [id number] Weight:       107.8 lb Date of Birth:  Feb 11, 1946 BSA:          1.435 m Patient Age:    76 years   BP:           92/60 mmHg Patient Gender: M          HR:           87 bpm. Exam Location:  [facility] Procedure: Limited Echo, Color Doppler and Cardiac Doppler Indications:     Tamponade I31.4  History:         Patient has prior history of Echocardiogram examinations, most                  recent 03/06/2023. CHF; Risk Factors:Hypertension. Tobacco abuse.  Sonographer:     Cristela Blue Referring Phys:  [id number] CARALYN HUDSON Diagnosing Phys: Marcina Millard MD IMPRESSIONS  1. Left ventricular ejection fraction, by estimation, is 60 to 65%. The left ventricle has normal function. The left ventricle has no regional wall motion abnormalities. Indeterminate diastolic filling due to E-A fusion.  2. Right ventricular systolic function is normal. The right ventricular size is normal.  3. Moderate pericardial effusion.  4. The mitral valve is normal in structure. Mild mitral valve regurgitation. No evidence of mitral stenosis.  5. The aortic valve is normal in structure. Aortic valve regurgitation is mild. No aortic stenosis is present.  6. The inferior vena cava is normal in size with greater than 50% respiratory variability, suggesting right atrial pressure of 3 mmHg. FINDINGS  Left Ventricle: Left ventricular ejection fraction, by estimation, is 60 to  65%. The left ventricle has normal function. The left ventricle has no regional wall motion abnormalities. The left ventricular internal cavity size was normal in size. There is  no left ventricular hypertrophy. Indeterminate diastolic filling due to E-A fusion. Right Ventricle: The right ventricular size is normal. No increase in right ventricular wall thickness. Right ventricular systolic function is normal. Left Atrium: Left atrial size was normal in size. Right Atrium: Right atrial size was normal in size. Pericardium: Pericardial effusion much reduced after  pericardiocenetesis. No right atrial or right ventricular diastolic collapse. No evidence for preicardial tamonade. A moderately sized pericardial effusion is present. Mitral Valve: The mitral valve is normal in structure. Mild mitral valve regurgitation. No evidence of mitral valve stenosis. Tricuspid Valve: The tricuspid valve is normal in structure. Tricuspid valve regurgitation is mild . No evidence of tricuspid stenosis. Aortic Valve: The aortic valve is normal in structure. Aortic valve regurgitation is mild. Aortic regurgitation PHT measures 436 msec. No aortic stenosis is present. Pulmonic Valve: The pulmonic valve was normal in structure. Pulmonic valve regurgitation is not visualized. No evidence of pulmonic stenosis. Aorta: The aortic root is normal in size and structure. Venous: The inferior vena cava is normal in size with greater than 50% respiratory variability, suggesting right atrial pressure of 3 mmHg. IAS/Shunts: No atrial level shunt detected by color flow Doppler. Additional Comments: Spectral Doppler performed. Color Doppler performed.  LEFT VENTRICLE PLAX 2D LVIDd:         4.40 cm LVIDs:         2.80 cm LV PW:         0.90 cm LV IVS:        0.80 cm LVOT diam:     2.00 cm LVOT Area:     3.14 cm  LEFT ATRIUM         Index LA diam:    2.80 cm 1.95 cm/m  AORTIC VALVE AI PHT:      436 msec  AORTA Ao Root diam: 3.00 cm MITRAL VALVE                 TRICUSPID VALVE MV Area (PHT): 4.99 cm     TR Peak grad:   24.0 mmHg MV Decel Time: 152 msec     TR Vmax:        245.00 cm/s MV E velocity: 126.00 cm/s                             SHUNTS                             Systemic Diam: 2.00 cm Marcina Millard MD Electronically signed by Marcina Millard MD Signature Date/Time: 03/07/2023/1:09:22 PM    Final    CT HEAD WO CONTRAST ( )  Result Date: 03/07/2023 CLINICAL DATA:  Neuro deficit, acute, stroke suspected. Altered mental status. Memory loss. EXAM: CT HEAD WITHOUT CONTRAST TECHNIQUE: Contiguous axial images were obtained from the base of the skull through the vertex without intravenous contrast. RADIATION DOSE REDUCTION: This exam was performed according to the departmental dose-optimization program which includes automated exposure control, adjustment of the mA and/or kV according to patient size and/or use of iterative reconstruction technique. COMPARISON:  Head CT and MRI 03/05/2023 FINDINGS: Brain: There is no evidence of an acute cortically based infarct, intracranial hemorrhage, midline shift, or extra-axial fluid collection. The ventricles are normal in size. Mild-to-moderate vasogenic edema in the right frontal lobe, left occipital lobe, and right cerebellar hemisphere is unchanged and without significant associated mass effect. Mild to moderate hypodensities elsewhere in the cerebral white matter bilaterally are nonspecific but compatible with chronic small vessel ischemic disease. 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 Vascular: Calcified atherosclerosis at the skull base. No hyperdense vessel. Skull: No acute fracture or suspicious osseous lesion. Sinuses/Orbits: Minimal mucosal thickening in the paranasal sinuses. Clear mastoid air cells. Unremarkable orbits.  Other: None. IMPRESSION: 1. Unchanged edema associated with known lesions in the right frontal lobe, left occipital lobe, and right cerebellum. No significant mass  effect. 2. No evidence of a new intracranial abnormality. Electronically Signed   By: Sebastian Ache M.D.   On: 03/07/2023 11:23   DG Chest Port 1 View  Result Date: 03/07/2023 CLINICAL DATA:  Pleural effusion. EXAM: PORTABLE CHEST 1 VIEW COMPARISON:  Chest radiograph and CT 03/05/2023 FINDINGS: The cardiac silhouette remains enlarged. Aortic atherosclerosis is noted. Veiling opacities in the lower lungs are consistent with persistent small to moderate pleural effusions as shown on CT. Bilateral interstitial and patchy airspace opacities have slightly worsened. No pneumothorax is identified. IMPRESSION: 1. Slight worsening of bilateral lung opacities which may reflect edema or pneumonia. 2. Persistent bilateral pleural effusions. Electronically Signed   By: Sebastian Ache M.D.   On: 03/07/2023 09:50   US Venous Img Lower Bilateral (DVT)  Result Date: 03/06/2023 CLINICAL DATA:  Lower extremity edema EXAM: BILATERAL LOWER EXTREMITY VENOUS DOPPLER ULTRASOUND TECHNIQUE: Gray-scale sonography with graded compression, as well as color Doppler and duplex ultrasound were performed to evaluate the lower extremity deep venous systems from the level of the common femoral vein and including the common femoral, femoral, profunda femoral, popliteal and calf veins including the posterior tibial, peroneal and gastrocnemius veins when visible. The superficial great saphenous vein was also interrogated. Spectral Doppler was utilized to evaluate flow at rest and with distal augmentation maneuvers in the common femoral, femoral and popliteal veins. COMPARISON:  None Available. FINDINGS: RIGHT LOWER EXTREMITY Common Femoral Vein: No evidence of thrombus. Normal compressibility, respiratory phasicity and response to augmentation. Saphenofemoral Junction: No evidence of thrombus. Normal compressibility and flow on color Doppler imaging. Profunda Femoral Vein: No evidence of thrombus. Normal compressibility and flow on color Doppler  imaging. Femoral Vein: No evidence of thrombus. Normal compressibility, respiratory phasicity and response to augmentation. Popliteal Vein: No evidence of thrombus. Normal compressibility, respiratory phasicity and response to augmentation. Calf Veins: No evidence of thrombus. Normal compressibility and flow on color Doppler imaging. Superficial Great Saphenous Vein: No evidence of thrombus. Normal compressibility. Venous Reflux:  None. Other Findings:  None. LEFT LOWER EXTREMITY Common Femoral Vein: No evidence of thrombus. Normal compressibility, respiratory phasicity and response to augmentation. Saphenofemoral Junction: No evidence of thrombus. Normal compressibility and flow on color Doppler imaging. Profunda Femoral Vein: No evidence of thrombus. Normal compressibility and flow on color Doppler imaging. Femoral Vein: No evidence of thrombus. Normal compressibility, respiratory phasicity and response to augmentation. Popliteal Vein: No evidence of thrombus. Normal compressibility, respiratory phasicity and response to augmentation. Calf Veins: No evidence of thrombus. Normal compressibility and flow on color Doppler imaging. Superficial Great Saphenous Vein: No evidence of thrombus. Normal compressibility. Venous Reflux:  None. Other Findings:  None. IMPRESSION: No evidence of deep venous thrombosis in either lower extremity. Electronically Signed   By: Alcide Clever M.D.   On: 03/06/2023 19:43   ECHOCARDIOGRAM COMPLETE  Result Date: 03/06/2023    ECHOCARDIOGRAM REPORT   Patient Name:  Everrett Corti    Date of      03/06/2023                           Exam: Medical Rec #: [id number]  Height:      60.0 in Accession #:   [id number] Weight:      99.2 lb Date of Birth: 03-09-1946 BSA:  1.386 m Patient Age:   76 years   BP:          Not listed in chart/Not listed in chart                                        mmHg Patient        M          HR:          Not listed in chart bpm. Gender: Exam Location: [facility]  Procedure: 2D Echo, Cardiac Doppler and Color Doppler Indications:     Tamponade I31.4  History:         Patient has prior history of Echocardiogram examinations, most                  recent 03/05/2023. CHF; Risk Factors:Hypertension. Tobacco abuse.  Sonographer:     Cristela Blue Referring Phys:  [id number] [facility] MICHELLE TANG Diagnosing Phys: Marcina Millard MD IMPRESSIONS  1. Left ventricular ejection fraction, by estimation, is 60 to 65%. The left ventricle has normal function. The left ventricle has no regional wall motion abnormalities. Left ventricular diastolic parameters are indeterminate.  2. Right ventricular systolic function is normal. The right ventricular size is normal.  3. Successful pericardiocentesis with removal of 1100 cc serosanguinous pericardial fluid.. Findings are consistent with cardiac tamponade.  4. The mitral valve is normal in structure. No evidence of mitral valve regurgitation. No evidence of mitral stenosis.  5. The aortic valve is normal in structure. Aortic valve regurgitation is not visualized. No aortic stenosis is present.  6. The inferior vena cava is normal in size with greater than 50% respiratory variability, suggesting right atrial pressure of 3 mmHg. FINDINGS  Left Ventricle: Left ventricular ejection fraction, by estimation, is 60 to 65%. The left ventricle has normal function. The left ventricle has no regional wall motion abnormalities. The left ventricular internal cavity size was normal in size. There is  no left ventricular hypertrophy. Left ventricular diastolic parameters are indeterminate. Right Ventricle: The right ventricular size is normal. No increase in right ventricular wall thickness. Right ventricular systolic function is normal. Left Atrium: Left atrial size was normal in size. Right Atrium: Right atrial size was normal in size. Pericardium: Successful pericardiocentesis with removal of 1100 cc serosanguinous pericardial fluid. There is no evidence of  pericardial effusion. The pericardial effusion is circumferential. There is diastolic collapse of the right ventricular free wall. There is evidence of cardiac tamponade. Mitral Valve: The mitral valve is normal in structure. No evidence of mitral valve regurgitation. No evidence of mitral valve stenosis. Tricuspid Valve: The tricuspid valve is normal in structure. Tricuspid valve regurgitation is not demonstrated. No evidence of tricuspid stenosis. Aortic Valve: The aortic valve is normal in structure. Aortic valve regurgitation is not visualized. No aortic stenosis is present. Pulmonic Valve: The pulmonic valve was normal in structure. Pulmonic valve regurgitation is not visualized. No evidence of pulmonic stenosis. Aorta: The aortic root is normal in size and structure. Venous: The inferior vena cava is normal in size with greater than 50% respiratory variability, suggesting right atrial pressure of 3 mmHg. IAS/Shunts: No atrial level shunt detected by color flow Doppler.  LEFT VENTRICLE PLAX 2D LVIDd:         3.85 cm LVIDs:         3.10 cm LV PW:  0.75 cm LV IVS:        1.10 cm  RIGHT VENTRICLE RV Basal diam:  3.40 cm RV Mid diam:    2.50 cm LEFT ATRIUM         Index       RIGHT ATRIUM           Index LA diam:    4.40 cm 3.18 cm/m  RA Area:     12.50 cm                                 RA Volume:   27.90 ml  20.14 ml/m Marcina Millard MD Electronically signed by Marcina Millard MD Signature Date/Time: 03/06/2023/5:12:27 PM    Final    CARDIAC CATHETERIZATION  Result Date: 03/06/2023 Successful pericardiocentesis with placement of pigtail catheter   ECHOCARDIOGRAM COMPLETE  Result Date: 03/06/2023    ECHOCARDIOGRAM REPORT   Patient Name:   Hilbert Winch    Date of Exam: 03/05/2023 Medical Rec #:  [id number]  Height:       57.0 in Accession #:    [id number] Weight:       100.0 lb Date of Birth:  [birth year] BSA:          1.340 m Patient Age:    76 years   BP:           123/76 mmHg Patient Gender: M           HR:           74 bpm. Exam Location:  [facility] Procedure: 2D Echo, Cardiac Doppler and Color Doppler Indications:     I31.3 Pericardial Effusion  History:         Patient has prior history of Echocardiogram examinations, most                  recent 07/27/2018. CHF; Risk Factors:Hypertension, Current                  Smoker and Alcohol Abuse.  Sonographer:     Daphine Deutscher RDCS Referring Phys:  [id number] Verdene Lennert Diagnosing Phys: Yvonne Kendall MD IMPRESSIONS  1. Left ventricular ejection fraction, by estimation, is 55 to 60%. The left ventricle has normal function. The left ventricle has no regional wall motion abnormalities. Left ventricular diastolic parameters are consistent with Grade I diastolic dysfunction (impaired relaxation).  2. Right ventricular systolic function is mildly reduced. The right ventricular size is normal. There is normal pulmonary artery systolic pressure.  3. Large pericardial effusion. The pericardial effusion is circumferential. Findings are consistent with cardiac tamponade.  4. The mitral valve is normal in structure. Mild mitral valve regurgitation.  5. Tricuspid valve regurgitation is mild to moderate.  6. The aortic valve is tricuspid. There is mild calcification of the aortic valve. There is mild thickening of the aortic valve. Aortic valve regurgitation is mild to moderate.  7. The inferior vena cava is dilated in size with <50% respiratory variability, suggesting right atrial pressure of 15 mmHg. Conclusion(s)/Recommendation(s): Findings were conveyed to Dr. Gillis Santa at 8:02 AM on 03/06/2023 by Dr. Cristal [place]. FINDINGS  Left Ventricle: Left ventricular ejection fraction, by estimation, is 55 to 60%. The left ventricle has normal function. The left ventricle has no regional wall motion abnormalities. The left ventricular internal cavity size was normal in size. There is  no left ventricular hypertrophy. Left ventricular diastolic parameters are consistent  with  Grade I diastolic dysfunction (impaired relaxation). Right Ventricle: The right ventricular size is normal. No increase in right ventricular wall thickness. Right ventricular systolic function is mildly reduced. There is normal pulmonary artery systolic pressure. The tricuspid regurgitant velocity is 1.71 m/s, and with an assumed right atrial pressure of 15 mmHg, the estimated right ventricular systolic pressure is 26.7 mmHg. Left Atrium: Left atrial size was normal in size. Right Atrium: Right atrial size was normal in size. Pericardium: A large pericardial effusion is present. The pericardial effusion is circumferential. The pericardial effusion appears to contain fibrous material. There is diastolic collapse of the right ventricular free wall and excessive respiratory variation in the mitral valve spectral Doppler velocities. There is evidence of cardiac tamponade. Mitral Valve: The mitral valve is normal in structure. There is mild thickening of the mitral valve leaflet(s). Mild mitral valve regurgitation. Tricuspid Valve: The tricuspid valve is normal in structure. Tricuspid valve regurgitation is mild to moderate. Aortic Valve: The aortic valve is tricuspid. There is mild calcification of the aortic valve. There is mild thickening of the aortic valve. Aortic valve regurgitation is mild to moderate. Aortic regurgitation PHT measures 507 msec. Aortic valve mean gradient measures 2.5 mmHg. Aortic valve peak gradient measures 4.3 mmHg. Aortic valve area, by VTI measures 2.84 cm. Pulmonic Valve: The pulmonic valve was normal in structure. Pulmonic valve regurgitation is trivial. No evidence of pulmonic stenosis. Aorta: The aortic root and ascending aorta are structurally normal, with no evidence of dilitation. Pulmonary Artery: The pulmonary artery is of normal size. Venous: The inferior vena cava is dilated in size with less than 50% respiratory variability, suggesting right atrial pressure of 15 mmHg.  IAS/Shunts: The interatrial septum was not well visualized.  LEFT VENTRICLE PLAX 2D LVIDd:         3.50 cm   Diastology LVIDs:         2.50 cm   LV e' medial:    4.46 cm/s LV PW:         0.90 cm   LV E/e' medial:  9.7 LV IVS:        0.80 cm   LV e' lateral:   6.31 cm/s LVOT diam:     2.00 cm   LV E/e' lateral: 6.9 LV SV:         46 LV SV Index:   34 LVOT Area:     3.14 cm  RIGHT VENTRICLE RV Basal diam:  3.10 cm RV S prime:     7.29 cm/s TAPSE (M-mode): 1.3 cm LEFT ATRIUM             Index        RIGHT ATRIUM           Index LA diam:        3.10 cm 2.31 cm/m   RA Area:     10.70 cm LA Vol (A2C):   37.7 ml 28.14 ml/m  RA Volume:   27.40 ml  20.45 ml/m LA Vol (A4C):   25.3 ml 18.88 ml/m LA Biplane Vol: 33.9 ml 25.30 ml/m  AORTIC VALVE AV Area (Vmax):    2.86 cm AV Area (Vmean):   2.60 cm AV Area (VTI):     2.84 cm AV Vmax:           103.69 cm/s AV Vmean:          74.930 cm/s AV VTI:            0.161 m AV Peak  Grad:      4.3 mmHg AV Mean Grad:      2.5 mmHg LVOT Vmax:         94.33 cm/s LVOT Vmean:        62.000 cm/s LVOT VTI:          0.145 m LVOT/AV VTI ratio: 0.90 AI PHT:            507 msec  AORTA Ao Root diam: 3.20 cm Ao Asc diam:  3.50 cm MITRAL VALVE               TRICUSPID VALVE MV Area (PHT): 4.28 cm    TR Peak grad:   11.7 mmHg MV Decel Time: 177 msec    TR Vmax:        171.00 cm/s MV E velocity: 43.23 cm/s MV A velocity: 63.73 cm/s  SHUNTS MV E/A ratio:  0.68        Systemic VTI:  0.15 m                            Systemic Diam: 2.00 cm Yvonne Kendall MD Electronically signed by Yvonne Kendall MD Signature Date/Time: 03/06/2023/8:13:55 AM    Final    MR Brain W and Wo Contrast  Result Date: 03/05/2023 CLINICAL DATA:  Mental status change, unknown cause EXAM: MRI HEAD WITHOUT AND WITH CONTRAST TECHNIQUE: Multiplanar, multiecho pulse sequences of the brain and surrounding structures were obtained without and with intravenous contrast. CONTRAST:  5mL GADAVIST GADOBUTROL 1 MMOL/ML IV SOLN  COMPARISON:  Same day CT head FINDINGS: Brain: Negative for an acute infarct. No hydrocephalus. No extra-axial fluid collection. Small microhemorrhage in the right lentiform nucleus. Chronic left cerebellar infarct. There is sequela mild-to-moderate chronic microvascular ischemic change. There are multiple lesions, including- -1.0 x 0.7 cm contrast-enhancing lesion in the right frontal lobe with surrounding vasogenic edema (series 18, image 108) - 0.8 x 0.7 cm contrast-enhancing lesions in in the left occipital lobe with surrounding vasogenic edema (series 18, image 69). -1.2 x 1.1 cm contrast-enhancing lesion in the superior right cerebellum mild surrounding vasogenic edema (series 18, image 50) -0.6 x 0.4 cm dural-based contrast-enhancing lesion along the left frontal convexity (series 93, image 176). Vascular: Normal flow voids. Skull and upper cervical spine: Normal marrow signal. Sinuses/Orbits: No middle ear or mastoid effusion. Mild mucosal thickening bilateral ethmoid air cells. Orbits are unremarkable. Other: None. IMPRESSION: 1. Multiple contrast-enhancing lesions in the right frontal lobe, left occipital lobe, and superior right cerebellum with surrounding vasogenic edema. 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 2. Small dural-based contrast-enhancing lesion along the left frontal convexity could represent a small meningimoa, but metastatic disease is not excluded. Electronically Signed   By: Lorenza Cambridge M.D.   On: 03/05/2023 16:48   CT CHEST ABDOMEN PELVIS W CONTRAST  Result Date: 03/05/2023 CLINICAL DATA:  Brain mass. Assess for occult malignancy. * Tracking Code: BO * EXAM: CT CHEST, ABDOMEN, AND PELVIS WITH CONTRAST TECHNIQUE: Multidetector CT imaging of the chest, abdomen and pelvis was performed following the standard protocol during bolus administration of intravenous contrast. RADIATION DOSE REDUCTION: This exam was performed according to the departmental dose-optimization  program which includes automated exposure control, adjustment of the mA and/or kV according to patient size and/or use of iterative reconstruction technique. CONTRAST:  75mL OMNIPAQUE IOHEXOL 300 MG/ML  SOLN COMPARISON:  Chest CT without contrast 04/28/2015. X-ray chest earlier 03/05/2023 FINDINGS: CT CHEST FINDINGS Cardiovascular: Very  large pericardial effusion. The heart itself is nonenlarged. Significant coronary artery calcifications. The thoracic aorta has a normal course and caliber with mild atherosclerotic calcified plaque. Mediastinum/Nodes: Normal caliber thoracic esophagus. Preserved thyroid gland. No specific abnormal lymph node enlargement seen including in the axillary regions, hilum. Prominent right paratracheal node on series 2, image 18 measures 18 by 7 mm. This could be 2 adjacent nodes. Subcarinal node series 2, image 29 measures 2.7 by 1.1 cm, mildly enlarged. Lungs/Pleura: Moderate right and small left effusion. There are some loculated components on the left with fluid tracking along the interlobar fissure. Adjacent parenchymal opacities. Atelectasis versus infiltrate. There is a opacity specifically in the lingula as well. Scattered ground-glass elsewhere in the visualized lungs. No pneumothorax. No obvious mass. Significant breathing motion throughout the examination. A subtle mass lesion could be obscured by the lung opacity in the effusions. Follow up study could be performed after improvement of the effusions. Musculoskeletal: Mild degenerative changes along the spine. CT ABDOMEN PELVIS FINDINGS Hepatobiliary: Contrast exam in the abdomen is more in the late arterial phase and portal venous phase. Patent portal vein. Gallbladder is present. Small cysts seen in segment 4 of the liver. Pancreas: Unremarkable. No pancreatic ductal dilatation or surrounding inflammatory changes. Spleen: Normal in size without focal abnormality. Adrenals/Urinary Tract: Slight nonspecific thickening of the  adrenal glands but unchanged from previous. No enhancing renal mass or collecting system dilatation. There are some small low-attenuation lesions in the left kidney which are too small to completely characterize but likely benign cysts, Bosniak 2 lesions. No specific imaging follow-up. The ureters have normal course and caliber extending down to the bladder. Diffuse wall thickening of the urinary bladder. Stomach/Bowel: On this non oral contrast exam, large bowel has a normal caliber. Redundant course of the sigmoid colon. There are some areas of mild wall thickening along loops of bowel, nonspecific. The stomach is mildly distended with fluid. Mild fold thickening suggested small bowel is nondilated. Vascular/Lymphatic: Normal caliber aorta and IVC with moderate calcified plaque along the aorta and iliac vessels. There are several enlarged nodes identified in the retroperitoneum and upper abdomen. Example aortocaval node posteriorly on series 2, image 64 measures 17 by 13 mm. No next of the celiac series 2, image 59 measures 16 by 14 mm. Left para-node anteriorly on series 2, image 68 measures 17 by 8 mm. A few small retrocrural nodes are also identified. Reproductive: Enlarged heterogeneous prostate. Please correlate with patient's PSA. Other: Anasarca.  Mesenteric haziness.  Trace ascites.  No free air Musculoskeletal: Mild degenerative changes along the spine. IMPRESSION: Large pericardial effusion. Please correlate with and new symptoms including for tamponade. Moderate right and small left effusion with some loculated components on the left. Adjacent parenchymal opacities. Atelectasis versus infiltrate. Recommend follow up after clearance of the effusions. Few borderline enlarged lymph nodes identified including mediastinum, upper retroperitoneum. Enlarged prostate with mass effect along the bladder with bladder wall thickening. Please correlate with patient's PSA. No bowel obstruction or free air. There is  scattered mesenteric haziness with trace ascites. There are some small areas of wall thickening along the colon but this could simply relate to the other signs of edema. Electronically Signed   By: Karen Kays M.D.   On: 03/05/2023 15:03   DG Chest Port 1 View  Result Date: 03/05/2023 CLINICAL DATA:  Shortness of breath EXAM: PORTABLE CHEST 1 VIEW COMPARISON:  07/27/2018 FINDINGS: Cardiac silhouette is enlarged with diffuse interstitial opacities throughout both lungs compatible with edema  related to CHF. Basilar atelectasis noted, worse on the left. Difficult to exclude small effusions. Also left lower lobe retrocardiac opacity obscures the left hemidiaphragm concerning for possible superimposed pneumonia. No pneumothorax. Trachea midline. Aorta atherosclerotic. No acute osseous finding. IMPRESSION: 1. Cardiomegaly with diffuse interstitial edema pattern. 2. Left lower lobe airspace consolidation concerning for superimposed pneumonia. 3. Suspect small effusions. Electronically Signed   By: Judie Petit.  Shick M.D.   On: 03/05/2023 13:20   CT Head Wo Contrast  Result Date: 03/05/2023 CLINICAL DATA:  Mental status change of unknown cause. Memory disturbance. EXAM: CT HEAD WITHOUT CONTRAST TECHNIQUE: Contiguous axial images were obtained from the base of the skull through the vertex without intravenous contrast. RADIATION DOSE REDUCTION: This exam was performed according to the departmental dose-optimization program which includes automated exposure control, adjustment of the mA and/or kV according to patient size and/or use of iterative reconstruction technique. COMPARISON:  07/27/2018 FINDINGS: Brain: No focal abnormality affects the brainstem. Question mild edema and mass effect in the inferior right cerebellum. 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 There is abnormal vasogenic edema within the right posterior frontal region worrisome for an underlying mass lesion. In the left hemisphere, there is  abnormal vasogenic edema in the occipital lobe worrisome for the presence of an occult mass lesion. No visible hemorrhage. Hydrocephalus or extra-axial collection. Vascular: There is atherosclerotic calcification of the major vessels at the base of the brain. Skull: No calvarial abnormality. Sinuses/Orbits: Clear/normal Other: None IMPRESSION: 1. Abnormal vasogenic edema in the right posterior frontal region and in the left occipital lobe worrisome for the presence of occult mass lesions. Question mild edema and mass effect in the inferior right cerebellum. No visible hemorrhage. MRI with and without contrast is recommended for further evaluation. 2. 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 3. Atherosclerotic calcification of the major vessels at the base of the brain. Electronically Signed   By: Paulina Fusi M.D.   On: 03/05/2023 12:52     Assessment and plan-   # Altered mental status, metabolic encephalopathy versus other etiologies.  CT head showed no evidence of acute intracranial process. Patient has a history of brain metastasis status post brain radiation.  I recommend to repeat an MRI for further evaluation, rule out any radiation-induced necrosis/edema  # Decreased oral intake secondary to severe mucositis This is like secondary to Retevmo -recommend to hold off-aspirin off treatment since admission. Continue supportive care.  Magic mouthwash swish and spit IV fluid hydration  # Failure to thrive Will continue current scope of care, pending goals of care discussion.  # Metastatic lung adenocarcinoma Recent CT scan showed no significant cancer progression.  Recurrent pericardial effusion. Recommend pericardiocentesis. Family is aware that condition is not curable and prognosis is poor. We will see in the next couple of days if he has any improvement with the supportive care.   Thank you for allowing me to participate in the care of this patient.   Rickard Patience, MD,  PhD Hematology Oncology 05/13/2023

## 2023-05-14 ENCOUNTER — Ambulatory Visit: Payer: Medicare Other | Admitting: Cardiovascular Disease

## 2023-05-14 DIAGNOSIS — Z515 Encounter for palliative care: Secondary | ICD-10-CM | POA: Diagnosis not present

## 2023-05-14 DIAGNOSIS — R531 Weakness: Secondary | ICD-10-CM | POA: Diagnosis not present

## 2023-05-14 MED ORDER — LORAZEPAM 0.5 MG PO TABS: 0.5000 mg | ORAL_TABLET | ORAL | 0 refills | Status: DC | PRN

## 2023-05-14 MED ORDER — MORPHINE SULFATE (CONCENTRATE) 10 MG/0.5ML PO SOLN: 10.0000 mg | ORAL | Status: DC | PRN

## 2023-05-14 MED ORDER — MORPHINE SULFATE (CONCENTRATE) 10 MG/0.5ML PO SOLN: 10.0000 mg | ORAL | 0 refills | Status: DC | PRN

## 2023-05-14 MED ORDER — LORAZEPAM 0.5 MG PO TABS: 0.5000 mg | ORAL_TABLET | ORAL | Status: DC | PRN

## 2023-05-14 MED ORDER — ASPIRIN 325 MG PO TABS
325.0000 mg | ORAL_TABLET | Freq: Every day | ORAL | Status: DC
  Filled 2023-05-14: qty 1

## 2023-05-14 NOTE — TOC Progression Note (Signed)
Transition of Care [facility]) - Progression Note    Patient Details  Name: Beecher Furio MRN: [id number] Date of Birth: Jun 16, 1946  [facility]) CM/SW Contact  Truddie Hidden, RN Phone Number: 05/14/2023, 10:37 AM  Clinical Narrative:    Spoke with patient's daughter, Irving Burton regarding discharge plan and request for hospice. She stated family would like to proceed with hospice. They would prefer a hospice IP unit via [employer], contracting. Referral made to [place] from [facility].         Expected Discharge Plan and Services                                               Social Determinants of Health (SDOH) Interventions SDOH Screenings   Transportation Needs: Unknown (08/25/2018)  Financial Resource Strain: Low Risk  (08/25/2018)  Stress: No Stress Concern Present (08/25/2018)  Tobacco Use: Medium Risk (05/11/2023)    Readmission Risk Interventions     No data to display

## 2023-05-14 NOTE — Plan of Care (Signed)

## 2023-05-14 NOTE — Progress Notes (Signed)
[facility]- [employer], contracting [facility])    Received request from Transitions of Care Manger for family interest in [facility].  Eligibility has been confirmed.   Met with family to confirm interest and explain services.  Family agreeable to transfer today.  .  Transitions of Care manager aware.  RN please call report to [phone] [facility]) prior to patient leaving the unit.  Please send signed and completed DNR with patient at discharge.  Thank you  [facility],  [facility] Liaison [phone]

## 2023-05-14 NOTE — Evaluation (Addendum)
Clinical/Bedside Swallow Evaluation Patient Details  Name: Cody [name] MRN: [id number] Date of Birth: 01/30/1946  Today's Date: 05/14/2023 Time: SLP Start Time (ACUTE ONLY): 0855 SLP Stop Time (ACUTE ONLY): 0940 SLP Time Calculation (min) (ACUTE ONLY): 45 min  Past Medical History:  Past Medical History:  Diagnosis Date   Alcohol dependence (HCC)    CHF (congestive heart failure) (HCC)    Hypertension    Tobacco abuse    Past Surgical History:  Past Surgical History:  Procedure Laterality Date   ABDOMINAL SURGERY     APPENDECTOMY     LEFT HEART CATH AND CORONARY ANGIOGRAPHY N/A 08/25/2018   Procedure: LEFT HEART CATH AND CORONARY ANGIOGRAPHY;  Surgeon: Laurier Nancy, MD;  Location: [facility] INVASIVE CV LAB;  Service: Cardiovascular;  Laterality: N/A;   PERICARDIOCENTESIS N/A 03/06/2023   Procedure: PERICARDIOCENTESIS;  Surgeon: Marcina Millard, MD;  Location: [facility] INVASIVE CV LAB;  Service: Cardiovascular;  Laterality: N/A;   HPI:  Pt is a 77 year old male with 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  In the ED, he was only able to murmur his name recognize his spouse.  MRI: Numerous punctate foci of abnormal diffusion restriction within  both hemispheres, consistent with acute to subacute ischemia, likely  to due to hypoperfusion (watershed infarcts) or embolic disease.  2. Punctate foci of contrast enhancement within the subcortical  right precentral gyrus, left occipital lobe and right cerebellum, likely treated metastatic disease.      CXR: Increased left basilar consolidation with small left pleural effusion.   Family reported Poor oral intake at home prior.     Assessment / Plan / Recommendation  Clinical Impression  Pt seen this morning for BSE. Pt resting in bed w/ eyes closed; 1 Daughter present then more Family members arrived. Pt responded to Family's voice briefly but was drowsy most often; he appeared weak, deconditioned, and frail. Family eager to "learn" how to "help" him eat/drink "better". Pt and Family speak [place] -- Family prefers to interpret for pt(MD/NSG aware and agree).  On RA, afebrile. Pt has been FTT at home prior to this admit.  Pt appears to present w/ SEVERE-PROFOUND oropharyngeal phase dysphagia w/ HIGH risk for aspiration and Pulmonary decline. Recommend strict NPO status w/ f/u w/ MD and Palliative Care/Hospice for GOC. Oral care for hygiene and stimulation of swallowing. This was discussed w/ both Family and MD. MD to f/u w/ Family also to discuss pt's prognosis and moving forward, GOC including comfort care status and Hospice. ST services can be available if improvement in pt's status warrants further/ongoing assessment. Pt agreed. SLP Visit Diagnosis: Dysphagia, oropharyngeal phase (R13.12)    Aspiration Risk  Severe aspiration risk;Risk for inadequate nutrition/hydration    Diet Recommendation   NPO  Medication Administration: Via alternative means    Other  Recommendations Recommended Consults:  (Palliative Care/Hospice f/u) Oral Care Recommendations: Oral care QID;Staff/trained caregiver to provide oral care    Recommendations for follow up therapy are one component of a multi-disciplinary discharge planning process, led by the attending physician.  Recommendations may be updated based on patient status, additional functional criteria and insurance authorization.  Follow up Recommendations Follow physician's recommendations for discharge plan and follow up therapies      Assistance Recommended at Discharge  FULL  Functional Status Assessment Patient has had a recent decline in their functional status and/or  demonstrates limited ability to make significant improvements in function in a reasonable and predictable amount of time  Frequency and Duration  (TBD)   (TBD)       Prognosis Prognosis for improved oropharyngeal function: Guarded Barriers to Reach Goals: Cognitive deficits;Language deficits;Time post onset;Behavior;Severity of deficits Barriers/Prognosis Comment: Brain mets, Ca; FTT; malnutrition baseline      Swallow Study   General Date of Onset: 05/11/23 HPI: Pt is a 77 year old male with 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  In the ED, he was only able to murmur his name recognize his spouse.  MRI: Numerous punctate foci of abnormal diffusion restriction within  both hemispheres, consistent with acute to subacute ischemia, likely  to due to hypoperfusion (watershed infarcts) or embolic disease.  2. Punctate foci of contrast enhancement within the subcortical  right precentral gyrus, left occipital lobe and right cerebellum, likely treated metastatic disease.      CXR: Increased left basilar consolidation with small left pleural effusion.   Family reported Poor oral intake at home prior. Type of Study: Bedside Swallow Evaluation Previous Swallow Assessment: none Diet Prior to this Study: Regular;Thin liquids (Level 0) Temperature Spikes Noted: No (wbc 7.0) Respiratory Status: Room air History of Recent Intubation: No Behavior/Cognition: Cooperative;Pleasant mood;Confused;Requires cueing;Lethargic/Drowsy (Eyes opened w/ stim) Oral Cavity Assessment: Dry Oral Care Completed by SLP: Yes Oral Cavity - Dentition: Poor condition;Missing dentition (Most) Vision:   (n/a) Self-Feeding Abilities: Total assist Patient Positioning: Upright in bed (total assist) Baseline Vocal Quality: Low vocal intensity (muttered speech x1) Volitional Cough: Cognitively unable to elicit Volitional Swallow: Unable to elicit    Oral/Motor/Sensory Function Overall Oral Motor/Sensory Function: Generalized oral weakness (no overt unilateral weakness noted)   Ice Chips Ice chips: Impaired Presentation: Spoon (fed; 2 trials) Oral Phase Impairments: Poor awareness of bolus;Reduced lingual movement/coordination;Reduced labial seal Oral Phase Functional Implications: Oral holding Pharyngeal Phase Impairments: Suspected delayed Swallow (did not immediately swallow to the ice chips)   Thin Liquid Thin Liquid: Impaired Presentation: Spoon (fed; 3 trials) Oral Phase Impairments: Reduced labial seal;Reduced lingual movement/coordination;Poor awareness of bolus Oral Phase Functional Implications: Prolonged oral transit;Oral holding Pharyngeal  Phase Impairments: Suspected delayed Swallow;Decreased hyoid-laryngeal movement;Multiple swallows;Wet Vocal Quality;Throat Clearing - Immediate;Cough - Immediate Other Comments: did not attempt further    Nectar Thick Nectar Thick Liquid: Not tested   Honey Thick Honey Thick Liquid: Not tested   Puree Puree: Not tested   Solid     Solid: Not tested        Jerilynn Som, MS, CCC-SLP Speech Language Pathologist Rehab Services; [facility] [phone] (ascom) Demonie Kassa 05/14/2023,5:17 PM

## 2023-05-14 NOTE — TOC Transition Note (Signed)
Transition of Care [facility]) - CM/SW Discharge Note   Patient Details  Name: Cody [name] MRN: [id number] Date of Birth: Jul 04, 1946  Transition of Care [facility]) CM/SW Contact:  Truddie Hidden, RN Phone Number: 05/14/2023, 12:37 PM   Clinical Narrative:    Per Hospice Liaision [facility], patient was approved for [employer] home today. Face sheet and medical necessity forms have been printed to the floor to be added to the EMS packet.   TOC signing off.           Patient Goals and CMS Choice      Discharge Placement                         Discharge Plan and Services Additional resources added to the After Visit Summary for                                       Social Determinants of Health (SDOH) Interventions SDOH Screenings   Transportation Needs: Unknown (08/25/2018)  Financial Resource Strain: Low Risk  (08/25/2018)  Stress: No Stress Concern Present (08/25/2018)  Tobacco Use: Medium Risk (05/11/2023)     Readmission Risk Interventions     No data to display

## 2023-05-14 NOTE — Progress Notes (Signed)
PT Cancellation Note  Patient Details Name: Cody [name] MRN: [id number] DOB: 1945/11/17   Cancelled Treatment:    Reason Eval/Treat Not Completed: Medical issues which prohibited therapy Per Dr. Allena Katz via secure chat Care team is creating a plan with family today. Patient is currently consulting for hospice care at home pending d/c. New imaging shows acute CVA. Will hold and re attempt next date if patient appropriate.   Malachi Carl, SPT   Malachi Carl 05/14/2023, 8:56 AM

## 2023-05-14 NOTE — Care Management Important Message (Signed)
Important Message  Patient Details  Name: Cody [name] MRN: [id number] Date of Birth: August 08, 1946   Medicare Important Message Given:  N/A - LOS <3 / Initial given by admissions     Olegario Messier A Andrew Blasius 05/14/2023, 11:39 AM

## 2023-05-14 NOTE — Discharge Summary (Addendum)
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    He was recently diagnosed with shingles. Was on oral antiviral meds.   chest x-ray increased left basilar consolidation with small left pleural effusion.   CT head-Noncontrast CT evidence of regressed cerebral metastatic disease since May, possibly following whole brain radiation. 2. No new intracranial abnormality identified. Chronic cerebellar ischemia.   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 -- Continue empiric antibiotics for now -- Pro calcitonin negative. White count normal. -- MRI brain  Numerous punctate foci of abnormal diffusion restriction within both hemispheres, consistent with acute to subacute ischemia, likely to due to hypoperfusion (watershed infarcts) or embolic  disease. 2. Punctate foci of contrast enhancement within the subcortical right precentral gyrus, left occipital lobe and right cerebellum, likely treated metastatic disease. All of these lesions have decreased in size since 03/05/2023. 3. Unchanged focus of dural-based contrast enhancement adjacent to the left frontal operculum, consistent with meningioma. --seen by ST--high risk for aspiration. Lethargic today  --d/w pt's family (several) along with Palliative care. Overall decline in mentation and high risk for aspiration and further decline. Family after discussing amongst themselves and Palliative care request pt to go to Hospice facility. 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   Chronic obstructive pulmonary disease (HCC)   Failure to thrive in adult/malnourished --Palliative care has been consulted   Shingles Status post 1 week course of acyclovir   Metastatic non-small cell lung cancer (HCC) --With brain metastasis with multiple lesions including right frontal lobe, left occipital lobe, superior right cerebellum with surrounding vasogenic edema--s/p whole brain radiation  recently --Malignant pericardial effusion-- status post pericardial sentences with removal of blood-tinged 1.2 L fluid in May -- overall poor prognosis      Consultants: oncology Disposition: Hospice care Diet recommendation: npo DISCHARGE MEDICATION: Allergies as of 05/14/2023       Reactions   Shellfish Allergy Hives   Only allergic to crabs        Medication List     STOP taking these medications    allopurinol 100 MG tablet Commonly known as: ZYLOPRIM   dexamethasone 1 MG tablet Commonly known as: DECADRON   finasteride 5 MG tablet Commonly known as: PROSCAR   folic acid 1 MG tablet Commonly known as: FOLVITE   gabapentin 100 MG capsule Commonly known as: Neurontin   iron polysaccharides 150 MG capsule Commonly known as: NIFEREX   Lactulose  20 GM/30ML Soln   levETIRAcetam 500  MG tablet Commonly known as: KEPPRA   loperamide 2 MG capsule Commonly known as: IMODIUM   magic mouthwash (nystatin, lidocaine, diphenhydrAMINE, alum & mag hydroxide) suspension   mirtazapine 15 MG tablet Commonly known as: Remeron   multivitamin with minerals Tabs tablet   nystatin 100000 UNIT/ML suspension Commonly known as: MYCOSTATIN   polyethylene glycol 17 g packet Commonly known as: MIRALAX / GLYCOLAX   prochlorperazine 10 MG tablet Commonly known as: COMPAZINE   Retevmo 40 MG capsule Generic drug: selpercatinib   tamsulosin 0.4 MG Caps capsule Commonly known as: FLOMAX   thiamine 100 MG tablet Commonly known as: Vitamin B-1   traMADol 50 MG tablet Commonly known as: Ultram   Vitamin D (Ergocalciferol) 1.25 MG (50000 UNIT) Caps capsule Commonly known as: DRISDOL       TAKE these medications    LORazepam 0.5 MG tablet Commonly known as: ATIVAN Take 1 tablet (0.5 mg total) by mouth every 4 (four) hours as needed for anxiety or seizure.   morphine CONCENTRATE 10 MG/0.5ML Soln concentrated solution Take 0.5 mLs (10 mg total) by mouth every 2 (two) hours as needed for severe pain.         Condition at discharge: poor  The results of significant diagnostics from this hospitalization (including imaging, microbiology, ancillary and laboratory) are listed below for reference.   Imaging Studies: MR BRAIN W WO CONTRAST  Result Date: 05/13/2023 CLINICAL DATA:  Delirium EXAM: MRI HEAD WITHOUT AND WITH CONTRAST TECHNIQUE: Multiplanar, multiecho pulse sequences of the brain and surrounding structures were obtained without and with intravenous contrast. CONTRAST:  4mL GADAVIST GADOBUTROL 1 MMOL/ML IV SOLN COMPARISON:  03/05/2023 FINDINGS: Brain: There are numerous punctate foci of abnormal diffusion restriction within both hemispheres. Chronic microhemorrhage in the right cerebellum and basal ganglia. Old left cerebellar  infarct. There is multifocal hyperintense T2-weighted signal within the white matter. Generalized volume loss. The midline structures are normal. There are punctate foci of contrast enhancement within the subcortical right precentral gyrus (19:107), left occipital lobe (image 73), and right cerebellum (image 54). All of these lesions have decreased in size since 03/05/2023. Unchanged focus of dural-based contrast enhancement adjacent to the left frontal operculum. Vascular: Major flow voids are preserved. Skull and upper cervical spine: Normal calvarium and skull base. Visualized upper cervical spine and soft tissues are normal. Sinuses/Orbits:No paranasal sinus fluid levels or advanced mucosal thickening. No mastoid or middle ear effusion. Normal orbits. IMPRESSION: 1. Numerous punctate foci of abnormal diffusion restriction within both hemispheres, consistent with acute to subacute ischemia, likely to due to hypoperfusion (watershed infarcts) or embolic disease. 2. Punctate foci of contrast enhancement within the subcortical right precentral gyrus, left occipital lobe and right cerebellum, likely treated metastatic disease. All of these lesions have decreased in size since 03/05/2023. 3. Unchanged focus of dural-based contrast enhancement adjacent to the left frontal operculum, consistent with meningioma. Electronically Signed   By: Deatra Robinson M.D.   On: 05/13/2023 20:23   CT HEAD WO CONTRAST ( )  Result Date: 05/12/2023 CLINICAL DATA:  77 year old male with altered mental status. Metastatic non-small cell lung cancer. Whole brain radiation treatment was planned for late May/June. EXAM: CT HEAD WITHOUT CONTRAST TECHNIQUE: Contiguous axial images were obtained from the base of the skull through the vertex without intravenous contrast. RADIATION DOSE REDUCTION: This exam was performed according to the departmental dose-optimization program which includes automated exposure control, adjustment of the mA  and/or kV according to patient size and/or use of iterative reconstruction technique.  COMPARISON:  Brain MRI 03/05/2023. head CT 03/07/2023. FINDINGS: Brain: Brain volume does not appear significantly changed. Regressed or resolved multifocal bilateral vasogenic edema when compared to May. Superimposed chronic left cerebellar infarct. Residual mild to moderate nonspecific patchy white matter hypodensity. No intracranial mass effect or midline shift. No acute intracranial hemorrhage identified. No cortically based acute infarct identified. Vascular: Calcified atherosclerosis at the skull base. No suspicious intracranial vascular hyperdensity. Skull: Stable.  No acute or suspicious osseous lesion. Sinuses/Orbits: Visualized paranasal sinuses and mastoids are stable and well aerated. Other: No acute orbit or scalp soft tissue finding. IMPRESSION: 1. Noncontrast CT evidence of regressed cerebral metastatic disease since May, possibly following whole brain radiation. 2. No new intracranial abnormality identified. Chronic cerebellar ischemia. Electronically Signed   By: Odessa Fleming M.D.   On: 05/12/2023 12:17   DG Chest Portable 1 View  Result Date: 05/11/2023 CLINICAL DATA:  Lung cancer EXAM: PORTABLE CHEST 1 VIEW COMPARISON:  04/26/2023 FINDINGS: Cardiomegaly. Aortic atherosclerosis. Increased left basilar consolidation with small left pleural effusion. No pneumothorax. IMPRESSION: Increased left basilar consolidation with small left pleural effusion. Electronically Signed   By: Duanne Guess D.O.   On: 05/11/2023 15:41   CT Angio Chest PE W and/or Wo Contrast  Result Date: 04/26/2023 CLINICAL DATA:  Chest and back pain since yesterday, short of breath, metastatic non-small cell lung cancer EXAM: CT ANGIOGRAPHY CHEST WITH CONTRAST TECHNIQUE: Multidetector CT imaging of the chest was performed using the standard protocol during bolus administration of intravenous contrast. Multiplanar CT image reconstructions and  MIPs were obtained to evaluate the vascular anatomy. RADIATION DOSE REDUCTION: This exam was performed according to the departmental dose-optimization program which includes automated exposure control, adjustment of the mA and/or kV according to patient size and/or use of iterative reconstruction technique. CONTRAST:  75mL OMNIPAQUE IOHEXOL 350 MG/ML SOLN COMPARISON:  03/19/2023, 04/26/2023, 03/05/2023 FINDINGS: Cardiovascular: This is a technically adequate evaluation of the pulmonary vasculature. No filling defects or pulmonary emboli. There is a persistent but decreased pericardial effusion, measuring up to 1.5 cm in thickness where previously it had measured up to 4.2 cm in thickness on CT 03/05/2023. The pericardial effusion had previously resolved on PET scan 03/19/2023. Normal caliber of the thoracic aorta. Stable atherosclerosis of the aorta and coronary vasculature. Mediastinum/Nodes: Mediastinal adenopathy again noted, measuring 8 mm in the pretracheal region and 13 mm in the subcarinal region. Thyroid, trachea, and esophagus are stable. Lungs/Pleura: Stable emphysema. The pleural effusions seen on prior CT have resolved in the interim. 13 mm spiculated left lower lobe pulmonary nodule image 97/6, corresponds to the metabolically active nodule on prior exam. Subpleural wedge-shaped consolidation medial aspect left lower lobe measuring 3 x 2 cm unchanged, which was not metabolically active on prior PET scan, and may reflect atelectasis or scarring. No acute airspace disease. No pneumothorax. Central airways are patent, with retained secretions in the trachea and left mainstem bronchus. Upper Abdomen: Upper abdominal adenopathy again visualized, compatible with the known metastatic adenopathy seen on prior PET scan. No other acute findings. Musculoskeletal: No acute or destructive bony abnormalities. Reconstructed images demonstrate no additional findings. Review of the MIP images confirms the above  findings. IMPRESSION: 1. No evidence of pulmonary embolus. 2. Stable irregular spiculated left lower lobe pulmonary nodule, consistent with a metabolically active nodule on prior PET scan and compatible with malignancy. 3. Persistent mediastinal and upper abdominal adenopathy, consistent with nodal metastases on prior PET scan. 4. Moderate pericardial effusion has recurred since the PET scan  03/19/2023. 5. Persistent wedge-shaped consolidation within the medial left lower lobe, likely scarring or atelectasis given lack of metabolic activity on prior PET scan. 6. Aortic Atherosclerosis (ICD10-I70.0) and Emphysema (ICD10-J43.9). Electronically Signed   By: Sharlet Salina M.D.   On: 04/26/2023 18:50   DG Chest 2 View  Result Date: 04/26/2023 CLINICAL DATA:  CP EXAM: CHEST - 2 VIEW COMPARISON:  03/07/2023 FINDINGS: Improved left lower lung infiltrate with only a small left infrahilar residual opacity. Right lung clear. No pneumothorax. Heart size and mediastinal contours are within normal limits. Aortic Atherosclerosis (ICD10-170.0). No effusion. Visualized bones unremarkable. IMPRESSION: Improved left lower lung infiltrate. Electronically Signed   By: Corlis Leak M.D.   On: 04/26/2023 15:32    Microbiology: Results for orders placed or performed during the hospital encounter of 05/11/23  Culture, blood (Routine X 2) w Reflex to ID Panel     Status: None (Preliminary result)   Collection Time: 05/11/23  7:41 PM   Specimen: BLOOD  Result Value Ref Range Status   Specimen Description BLOOD BLOOD RIGHT ARM  Final   Special Requests   Final    BOTTLES DRAWN AEROBIC AND ANAEROBIC Blood Culture adequate volume   Culture   Final    NO GROWTH 3 DAYS Performed at [facility], [address] [place], [place] [address]    Report Status PENDING  Incomplete  Culture, blood (Routine X 2) w Reflex to ID Panel     Status: None (Preliminary result)   Collection Time: 05/11/23  7:56 PM   Specimen: BLOOD   Result Value Ref Range Status   Specimen Description BLOOD BLOOD RIGHT HAND  Final   Special Requests   Final    BOTTLES DRAWN AEROBIC AND ANAEROBIC Blood Culture adequate volume   Culture   Final    NO GROWTH 3 DAYS Performed at [facility], [address] [place], [place] [address]    Report Status PENDING  Incomplete    Labs: CBC: Recent Labs  Lab 05/10/23 1404 05/11/23 1622 05/12/23 0503  WBC 6.3 5.9 7.0  NEUTROABS 4.7 4.1  --   HGB 12.6* 13.0 12.5*  HCT 38.7* 40.3 39.0  MCV 88.6 89.2 90.9  PLT 295 272 226   Basic Metabolic Panel: Recent Labs  Lab 05/10/23 1404 05/11/23 1622 05/12/23 0503  NA 130* 133* 132*  K 4.3 4.3 4.3  CL 98 100 104  CO2 22 23 17*  GLUCOSE 114* 91 73  BUN 17 22 22   CREATININE 0.99 1.13 0.83  CALCIUM 8.6* 7.8* 7.2*   Liver Function Tests: Recent Labs  Lab 05/10/23 1404 05/11/23 1622  AST 29 34  ALT 25 25  ALKPHOS 79 78  BILITOT 0.3 0.8  PROT 6.9 6.9  ALBUMIN 2.2* 2.2*    Discharge time spent: greater than 30 minutes.  Signed: Enedina Finner, MD [facility] 05/14/2023

## 2023-05-14 NOTE — Progress Notes (Signed)
Palliative Medicine [facility] at [facility] [phone] [phone]   Name: Travarus Trudo Date: 05/14/2023 MRN: [id number]  DOB: February 10, 1946  Patient Care Team: Orson Eva, NP as PCP - General (Nurse Practitioner) Glory Buff, RN as Oncology Nurse Navigator    REASON FOR CONSULTATION: Yug Loria is a 77 y.o. 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  Patient was admitted to the hospital 05/11/2023 with altered mental status and failure to thrive.  Patient was referred to palliative care to address goals and manage ongoing symptoms.   CODE STATUS: DNR  PAST MEDICAL HISTORY: Past Medical History:  Diagnosis Date   Alcohol dependence (HCC)    CHF (congestive heart failure) (HCC)    Hypertension    Tobacco abuse     PAST SURGICAL HISTORY:  Past Surgical History:  Procedure Laterality Date   ABDOMINAL SURGERY     APPENDECTOMY     LEFT HEART CATH AND CORONARY ANGIOGRAPHY N/A 08/25/2018   Procedure: LEFT HEART CATH AND CORONARY ANGIOGRAPHY;  Surgeon: Laurier Nancy, MD;  Location: [facility] INVASIVE CV LAB;  Service: Cardiovascular;  Laterality: N/A;   PERICARDIOCENTESIS N/A 03/06/2023   Procedure: PERICARDIOCENTESIS;  Surgeon: Marcina Millard, MD;  Location: [facility] INVASIVE CV LAB;  Service: Cardiovascular;  Laterality: N/A;    HEMATOLOGY/ONCOLOGY HISTORY:  Oncology History  Metastatic non-small cell lung cancer (HCC)  03/05/2023 Imaging   CT chest abdomen pelvis with contrast showed large pericardial effusion. Moderate right and small left effusion with some loculated components on the left. Adjacent parenchymal opacities. Atelectasis versus infiltrates. Few borderline enlarged lymph nodes including mediastinum, upper retroperitoneum. Enlarged prostate with mass effect along the bladder with  bladder wall thickening. No bowel obstruction or free air. Small area of wall thickening along the colon.    03/05/2023 Imaging   CT head showed abdominal vasogenic edema in the right posterior frontal region and in the left occipital lobe worrisome for presence of occult mass lesions. Questionable mild edema and mass effect in the inferior right cerebellum. No visible hemorrhage. 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 Atherosclerotic calcification of the major vessels at the base of brain.    03/05/2023 Imaging   MRI brain with and without contrast showed multiple contrast-enhancing lesions in the right frontal lobe, left occipital lobe, superior right cerebellum with surrounding vasogenic edema. Worrisome for intracranial metastatic disease. Small dural based contrast-enhancing lesions along the left frontal convexity could represent a small hemangioma. Metastatic disease is not excluded.    03/05/2023 - 03/12/2023 Hospital Admission   Patient presented emergency room due to confusion, shortness of breath. CT head and MRI showed multiple contrast-enhancing lesions with vasogenic edema CT chest abdomen pelvis showed large pericardial effusion, pleural effusion, mediastinal/upper retroperitoneum lymphadenopathy Patient was treated with Decadron, neurosurgery recommended no intervention. Status post pericardiocentesis on 03/07/2023.  Pigtail catheter was initially placed and later on removed.  TTE showed no pericardial effusion. Bilateral pleural effusion, status post thoracentesis.  Cytology positive. 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  Patient was treated with 3% saline clinic sodium improved. Gout with uric acid of 9.7.  Started on colchicine. Alcohol use disorder, status post CIWA protocol with Ativan as needed.   03/07/2023 Cancer Staging   Staging form: Lung, AJCC 8th Edition - Clinical stage from 03/07/2023: Stage IV (cTX, cN2, cM1) - Signed by Cathie Hoops,  Janyth Contes,  MD on 03/13/2023 Stage prefix: Initial diagnosis   03/07/2023 Echocardiogram   1. Left ventricular ejection fraction, by estimation, is 60 to 65%. The left ventricle has normal function. The left ventricle has no regional  wall motion abnormalities. Indeterminate diastolic filling due to E-A fusion.   2. Right ventricular systolic function is normal. The right ventricular size is normal.   3. Moderate pericardial effusion.   4. The mitral valve is normal in structure. Mild mitral valve regurgitation. No evidence of mitral stenosis.   5. The aortic valve is normal in structure. Aortic valve regurgitation is mild. No aortic stenosis is present.   6. The inferior vena cava is normal in size with greater than 50% respiratory variability, suggesting right atrial pressure of 3 mmHg.     03/07/2023 Procedure   -5/9 S/p pericardiocentesis with removal of 1.1 L of bloody fluid.  Pigtail catheter left in place.  -Cytology came back positive for malignancy.  Metastatic adenocarcinoma, compatible with pulmonary adenocarcinoma   -5/9 s/p  thoracentesis, cytology is negative.    03/08/2023 Initial Diagnosis   Metastatic non-small cell lung cancer (HCC)  [facility] NGS showed RET [id number] missense variant [VAF33.8%] NF1 frame shift, TP53, NFE2L2, CDKN2A, CKKN2B TMB 15.8, MSI stable.   03/11/2023 Echocardiogram   1. Left ventricular ejection fraction, by estimation, is 60 to 65%. The left ventricle has normal function.   2. Mild to moderate mitral valve regurgitation.   3. Aortic valve regurgitation is mild to moderate    03/19/2023 - 04/03/2023 Radiation Therapy   Whole Brain Radiation.    03/20/2023 - 04/03/2023 Radiation Therapy   Whole Brain Radiation.    03/23/2023 Imaging   PET scan showed 13 mm irregular left lower lobe nodule, likely corresponding to the patient's known primary bronchogenic carcinoma.   Thoracic and upper abdominal nodal metastases, as described above.   Irregular appearance of the  posterior gastric antrum, with associated hypermetabolism, as above. Appearance favors gastritis versus gastric ulcer. Consider upper endoscopy for further evaluation.   Prior pericardial effusion and bilateral pleural effusions have resolved.     ALLERGIES:  is allergic to shellfish allergy.  MEDICATIONS:  Current Facility-Administered Medications  Medication Dose Route Frequency Provider Last Rate Last Admin   allopurinol (ZYLOPRIM) tablet 100 mg  100 mg Oral Daily Enedina Finner, MD       aspirin tablet 325 mg  325 mg Oral Daily Enedina Finner, MD       enoxaparin (LOVENOX) injection 30 mg  30 mg Subcutaneous Q24H Cox, Amy N, Marvelous   30 mg at 05/13/23 2252   finasteride (PROSCAR) tablet 5 mg  5 mg Oral Daily Enedina Finner, MD       folic acid (FOLVITE) tablet 1 mg  1 mg Oral Daily Enedina Finner, MD       iron polysaccharides (NIFEREX) capsule 150 mg  150 mg Oral Daily Enedina Finner, MD       levETIRAcetam (KEPPRA) tablet 500 mg  500 mg Oral BID Enedina Finner, MD   500 mg at 05/13/23 2252   LORazepam (ATIVAN) injection 0.5 mg  0.5 mg Intravenous Q6H PRN Cox, Amy N, Ander   0.5 mg at 05/14/23 1610   magic mouthwash w/lidocaine  5 mL Oral QID Rickard Patience, MD   5 mL at 05/13/23 2253   mirtazapine (REMERON) tablet 15 mg  15 mg Oral QHS Enedina Finner, MD   15 mg at 05/13/23 2252   multivitamin with minerals tablet 1 tablet  1 tablet Oral Daily Enedina Finner, MD       ondansetron [facility]) tablet 4 mg  4 mg Oral Q6H PRN Cox, Amy N, Edgardo       Or   ondansetron (ZOFRAN) injection 4 mg  4 mg Intravenous Q6H PRN Cox, Amy N, Reuven       senna-docusate (Senokot-S) tablet 1 tablet  1 tablet Oral QHS PRN Cox, Amy N, Shlomie       tamsulosin (FLOMAX) capsule 0.4 mg  0.4 mg Oral Daily Enedina Finner, MD       thiamine (VITAMIN B1) tablet 100 mg  100 mg Oral Daily Enedina Finner, MD        VITAL SIGNS: BP 99/73 (BP Location: Right Arm)   Pulse 100   Temp 98.5 F (36.9 C)   Resp 18   SpO2 100%  There were no vitals filed for this visit.   Estimated body mass index is 20 kg/m as calculated from the following:   Height as of 04/26/23: 4\' 11"  (1.499 m).   Weight as of 05/03/23: 99 lb (44.9 kg).  LABS: CBC:    Component Value Date/Time   WBC 7.0 05/12/2023 0503   HGB 12.5 (L) 05/12/2023 0503   HGB 12.6 (L) 05/10/2023 1404   HGB 14.6 12/19/2022 0841   HCT 39.0 05/12/2023 0503   HCT 43.8 12/19/2022 0841   PLT 226 05/12/2023 0503   PLT 295 05/10/2023 1404   PLT 292 12/19/2022 0841   MCV 90.9 05/12/2023 0503   MCV 99 (H) 12/19/2022 0841   NEUTROABS 4.1 05/11/2023 1622   NEUTROABS 3.9 12/19/2022 0841   LYMPHSABS 0.7 05/11/2023 1622   LYMPHSABS 1.7 12/19/2022 0841   MONOABS 0.2 05/11/2023 1622   EOSABS 0.8 (H) 05/11/2023 1622   EOSABS 1.3 (H) 12/19/2022 0841   BASOSABS 0.0 05/11/2023 1622   BASOSABS 0.0 12/19/2022 0841   Comprehensive Metabolic Panel:    Component Value Date/Time   NA 132 (L) 05/12/2023 0503   NA 133 (L) 04/08/2023 1303   K 4.3 05/12/2023 0503   CL 104 05/12/2023 0503   CO2 17 (L) 05/12/2023 0503   BUN 22 05/12/2023 0503   BUN 21 04/08/2023 1303   CREATININE 0.83 05/12/2023 0503   CREATININE 0.99 05/10/2023 1404   GLUCOSE 73 05/12/2023 0503   CALCIUM 7.2 (L) 05/12/2023 0503   AST 34 05/11/2023 1622   AST 29 05/10/2023 1404   ALT 25 05/11/2023 1622   ALT 25 05/10/2023 1404   ALKPHOS 78 05/11/2023 1622   BILITOT 0.8 05/11/2023 1622   BILITOT 0.3 05/10/2023 1404   PROT 6.9 05/11/2023 1622   PROT 6.4 04/08/2023 1303   ALBUMIN 2.2 (L) 05/11/2023 1622   ALBUMIN 3.3 (L) 04/08/2023 1303    RADIOGRAPHIC STUDIES: MR BRAIN W WO CONTRAST  Result Date: 05/13/2023 CLINICAL DATA:  Delirium EXAM: MRI HEAD WITHOUT AND WITH CONTRAST TECHNIQUE: Multiplanar, multiecho pulse sequences of the brain and surrounding structures were obtained without and with intravenous contrast. CONTRAST:  4mL GADAVIST GADOBUTROL 1 MMOL/ML IV SOLN COMPARISON:  03/05/2023 FINDINGS: Brain: There are numerous punctate foci of  abnormal diffusion restriction within both hemispheres. Chronic microhemorrhage in the right cerebellum and basal ganglia. Old left cerebellar infarct. There is multifocal hyperintense T2-weighted signal within the white matter. Generalized volume loss. The midline structures are normal. There are punctate foci of contrast enhancement within the subcortical right precentral gyrus (19:107), left occipital lobe (image 73), and right cerebellum (image 54). All of these  lesions have decreased in size since 03/05/2023. Unchanged focus of dural-based contrast enhancement adjacent to the left frontal operculum. Vascular: Major flow voids are preserved. Skull and upper cervical spine: Normal calvarium and skull base. Visualized upper cervical spine and soft tissues are normal. Sinuses/Orbits:No paranasal sinus fluid levels or advanced mucosal thickening. No mastoid or middle ear effusion. Normal orbits. IMPRESSION: 1. Numerous punctate foci of abnormal diffusion restriction within both hemispheres, consistent with acute to subacute ischemia, likely to due to hypoperfusion (watershed infarcts) or embolic disease. 2. Punctate foci of contrast enhancement within the subcortical right precentral gyrus, left occipital lobe and right cerebellum, likely treated metastatic disease. All of these lesions have decreased in size since 03/05/2023. 3. Unchanged focus of dural-based contrast enhancement adjacent to the left frontal operculum, consistent with meningioma. Electronically Signed   By: Deatra Robinson M.D.   On: 05/13/2023 20:23   CT HEAD WO CONTRAST ( )  Result Date: 05/12/2023 CLINICAL DATA:  77 year old male with altered mental status. Metastatic non-small cell lung cancer. Whole brain radiation treatment was planned for late May/June. EXAM: CT HEAD WITHOUT CONTRAST TECHNIQUE: Contiguous axial images were obtained from the base of the skull through the vertex without intravenous contrast. RADIATION DOSE REDUCTION: This  exam was performed according to the departmental dose-optimization program which includes automated exposure control, adjustment of the mA and/or kV according to patient size and/or use of iterative reconstruction technique. COMPARISON:  Brain MRI 03/05/2023. head CT 03/07/2023. FINDINGS: Brain: Brain volume does not appear significantly changed. Regressed or resolved multifocal bilateral vasogenic edema when compared to May. Superimposed chronic left cerebellar infarct. Residual mild to moderate nonspecific patchy white matter hypodensity. No intracranial mass effect or midline shift. No acute intracranial hemorrhage identified. No cortically based acute infarct identified. Vascular: Calcified atherosclerosis at the skull base. No suspicious intracranial vascular hyperdensity. Skull: Stable.  No acute or suspicious osseous lesion. Sinuses/Orbits: Visualized paranasal sinuses and mastoids are stable and well aerated. Other: No acute orbit or scalp soft tissue finding. IMPRESSION: 1. Noncontrast CT evidence of regressed cerebral metastatic disease since May, possibly following whole brain radiation. 2. No new intracranial abnormality identified. Chronic cerebellar ischemia. Electronically Signed   By: Odessa Fleming M.D.   On: 05/12/2023 12:17   DG Chest Portable 1 View  Result Date: 05/11/2023 CLINICAL DATA:  Lung cancer EXAM: PORTABLE CHEST 1 VIEW COMPARISON:  04/26/2023 FINDINGS: Cardiomegaly. Aortic atherosclerosis. Increased left basilar consolidation with small left pleural effusion. No pneumothorax. IMPRESSION: Increased left basilar consolidation with small left pleural effusion. Electronically Signed   By: Duanne Guess D.O.   On: 05/11/2023 15:41   CT Angio Chest PE W and/or Wo Contrast  Result Date: 04/26/2023 CLINICAL DATA:  Chest and back pain since yesterday, short of breath, metastatic non-small cell lung cancer EXAM: CT ANGIOGRAPHY CHEST WITH CONTRAST TECHNIQUE: Multidetector CT imaging of the  chest was performed using the standard protocol during bolus administration of intravenous contrast. Multiplanar CT image reconstructions and MIPs were obtained to evaluate the vascular anatomy. RADIATION DOSE REDUCTION: This exam was performed according to the departmental dose-optimization program which includes automated exposure control, adjustment of the mA and/or kV according to patient size and/or use of iterative reconstruction technique. CONTRAST:  75mL OMNIPAQUE IOHEXOL 350 MG/ML SOLN COMPARISON:  03/19/2023, 04/26/2023, 03/05/2023 FINDINGS: Cardiovascular: This is a technically adequate evaluation of the pulmonary vasculature. No filling defects or pulmonary emboli. There is a persistent but decreased pericardial effusion, measuring up to 1.5 cm in thickness where previously it  had measured up to 4.2 cm in thickness on CT 03/05/2023. The pericardial effusion had previously resolved on PET scan 03/19/2023. Normal caliber of the thoracic aorta. Stable atherosclerosis of the aorta and coronary vasculature. Mediastinum/Nodes: Mediastinal adenopathy again noted, measuring 8 mm in the pretracheal region and 13 mm in the subcarinal region. Thyroid, trachea, and esophagus are stable. Lungs/Pleura: Stable emphysema. The pleural effusions seen on prior CT have resolved in the interim. 13 mm spiculated left lower lobe pulmonary nodule image 97/6, corresponds to the metabolically active nodule on prior exam. Subpleural wedge-shaped consolidation medial aspect left lower lobe measuring 3 x 2 cm unchanged, which was not metabolically active on prior PET scan, and may reflect atelectasis or scarring. No acute airspace disease. No pneumothorax. Central airways are patent, with retained secretions in the trachea and left mainstem bronchus. Upper Abdomen: Upper abdominal adenopathy again visualized, compatible with the known metastatic adenopathy seen on prior PET scan. No other acute findings. Musculoskeletal: No acute or  destructive bony abnormalities. Reconstructed images demonstrate no additional findings. Review of the MIP images confirms the above findings. IMPRESSION: 1. No evidence of pulmonary embolus. 2. Stable irregular spiculated left lower lobe pulmonary nodule, consistent with a metabolically active nodule on prior PET scan and compatible with malignancy. 3. Persistent mediastinal and upper abdominal adenopathy, consistent with nodal metastases on prior PET scan. 4. Moderate pericardial effusion has recurred since the PET scan 03/19/2023. 5. Persistent wedge-shaped consolidation within the medial left lower lobe, likely scarring or atelectasis given lack of metabolic activity on prior PET scan. 6. Aortic Atherosclerosis (ICD10-I70.0) and Emphysema (ICD10-J43.9). Electronically Signed   By: Sharlet Salina M.D.   On: 04/26/2023 18:50   DG Chest 2 View  Result Date: 04/26/2023 CLINICAL DATA:  CP EXAM: CHEST - 2 VIEW COMPARISON:  03/07/2023 FINDINGS: Improved left lower lung infiltrate with only a small left infrahilar residual opacity. Right lung clear. No pneumothorax. Heart size and mediastinal contours are within normal limits. Aortic Atherosclerosis (ICD10-170.0). No effusion. Visualized bones unremarkable. IMPRESSION: Improved left lower lung infiltrate. Electronically Signed   By: Corlis Leak M.D.   On: 04/26/2023 15:32    PERFORMANCE STATUS (ECOG) : 3 - Symptomatic, >50% confined to bed  Review of Systems Unable to complete  Physical Exam General: Thin, frail appearing Pulmonary: Unlabored Extremities: no edema, no joint deformities Skin: no rashes Neurological: Lethargic  IMPRESSION: Patient more lethargic today but did receive dose of lorazepam overnight.  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  Patient n.p.o. after SLP evaluation.  I met with 4 daughters (including Irving Burton) and ex-wife.  Family say they have all talked and are not interested in further treatment and instead would like to  transition to comfort care/hospice.  Discussed possibility of sending patient home with hospice and option for outpatient follow-up in the cancer center should he improve.  However, family Latif not feel they can care for him at home and are fearful that patient would decline and die at home.  [facility] and request that patient be transferred there.  Will ask the hospice liaison to coordinate.  PLAN: -Best supportive care -Hospice referral  Case discussed with Dr. Cathie Hoops and Dr. Allena Katz   Time Total: 25 minutes  Visit consisted of counseling and education dealing with the complex and emotionally intense issues of symptom management and palliative care in the setting of serious and potentially life-threatening illness.Greater than 50%  of this time was spent counseling and coordinating care  related to the above assessment and plan.  Signed by: Laurette Schimke, PhD, NP-C

## 2023-05-16 LAB — CULTURE, BLOOD (ROUTINE X 2)
Culture: NO GROWTH
Culture: NO GROWTH
Special Requests: ADEQUATE
Special Requests: ADEQUATE

## 2023-05-20 ENCOUNTER — Inpatient Hospital Stay: Payer: Medicare Other | Admitting: Oncology

## 2023-05-20 ENCOUNTER — Inpatient Hospital Stay: Payer: Medicare Other

## 2023-05-24 ENCOUNTER — Other Ambulatory Visit (HOSPITAL_COMMUNITY): Payer: Self-pay

## 2023-05-24 ENCOUNTER — Other Ambulatory Visit: Payer: Self-pay

## 2023-05-28 ENCOUNTER — Inpatient Hospital Stay: Payer: Medicare Other

## 2023-05-30 DEATH — deceased

## 2024-04-16 ENCOUNTER — Ambulatory Visit: Payer: Medicare Other | Admitting: Urology
